# Patient Record
Sex: Male | Born: 1960 | Race: White | Hispanic: No | Marital: Married | Smoking: Current every day smoker
Health system: Southern US, Community
[De-identification: ages and names within clinical notes are randomized; demographics above are authoritative.]

## PROBLEM LIST (undated history)

## (undated) DIAGNOSIS — H9319 Tinnitus, unspecified ear: Secondary | ICD-10-CM

## (undated) DIAGNOSIS — F32A Depression, unspecified: Secondary | ICD-10-CM

## (undated) DIAGNOSIS — E785 Hyperlipidemia, unspecified: Secondary | ICD-10-CM

## (undated) DIAGNOSIS — G4730 Sleep apnea, unspecified: Secondary | ICD-10-CM

## (undated) DIAGNOSIS — J449 Chronic obstructive pulmonary disease, unspecified: Secondary | ICD-10-CM

## (undated) DIAGNOSIS — F419 Anxiety disorder, unspecified: Secondary | ICD-10-CM

## (undated) DIAGNOSIS — I639 Cerebral infarction, unspecified: Secondary | ICD-10-CM

## (undated) DIAGNOSIS — J439 Emphysema, unspecified: Secondary | ICD-10-CM

## (undated) DIAGNOSIS — K219 Gastro-esophageal reflux disease without esophagitis: Secondary | ICD-10-CM

## (undated) DIAGNOSIS — Z5189 Encounter for other specified aftercare: Secondary | ICD-10-CM

## (undated) DIAGNOSIS — J111 Influenza due to unidentified influenza virus with other respiratory manifestations: Secondary | ICD-10-CM

## (undated) DIAGNOSIS — R0902 Hypoxemia: Secondary | ICD-10-CM

## (undated) DIAGNOSIS — M1990 Unspecified osteoarthritis, unspecified site: Secondary | ICD-10-CM

## (undated) DIAGNOSIS — R911 Solitary pulmonary nodule: Secondary | ICD-10-CM

## (undated) DIAGNOSIS — T7840XA Allergy, unspecified, initial encounter: Secondary | ICD-10-CM

## (undated) DIAGNOSIS — I10 Essential (primary) hypertension: Secondary | ICD-10-CM

## (undated) DIAGNOSIS — C50919 Malignant neoplasm of unspecified site of unspecified female breast: Secondary | ICD-10-CM

## (undated) HISTORY — DX: Chronic obstructive pulmonary disease, unspecified: J44.9

## (undated) HISTORY — DX: Sleep apnea, unspecified: G47.30

## (undated) HISTORY — DX: Solitary pulmonary nodule: R91.1

## (undated) HISTORY — DX: Allergy, unspecified, initial encounter: T78.40XA

## (undated) HISTORY — DX: Depression, unspecified: F32.A

## (undated) HISTORY — PX: SPINE SURGERY: SHX786

## (undated) HISTORY — DX: Hypoxemia: R09.02

## (undated) HISTORY — DX: Gastro-esophageal reflux disease without esophagitis: K21.9

## (undated) HISTORY — PX: BACK SURGERY: SHX140

## (undated) HISTORY — DX: Emphysema, unspecified: J43.9

## (undated) HISTORY — DX: Essential (primary) hypertension: I10

## (undated) HISTORY — DX: Tinnitus, unspecified ear: H93.19

## (undated) HISTORY — DX: Malignant neoplasm of unspecified site of unspecified female breast: C50.919

## (undated) HISTORY — DX: Encounter for other specified aftercare: Z51.89

## (undated) HISTORY — DX: Hyperlipidemia, unspecified: E78.5

## (undated) HISTORY — DX: Cerebral infarction, unspecified: I63.9

---
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
## 2014-12-07 ENCOUNTER — Ambulatory Visit
Admission: RE | Admit: 2014-12-07 | Discharge: 2014-12-07 | Disposition: A | Discharge status: Home / Self Care | Payer: Self-pay | Source: Ambulatory Visit | Attending: Student | Admitting: Student

## 2014-12-07 ENCOUNTER — Other Ambulatory Visit: Payer: Self-pay | Admitting: Student

## 2014-12-07 DIAGNOSIS — M898X3 Other specified disorders of bone, forearm: Secondary | ICD-10-CM | POA: Insufficient documentation

## 2014-12-07 DIAGNOSIS — S52335A Nondisplaced oblique fracture of shaft of left radius, initial encounter for closed fracture: Secondary | ICD-10-CM

## 2014-12-07 DIAGNOSIS — M899 Disorder of bone, unspecified: Secondary | ICD-10-CM

## 2014-12-07 MED ORDER — GADOBENATE DIMEGLUMINE 529 MG/ML IV SOLN
20.0000 mL | Freq: Once | INTRAVENOUS | Status: AC | PRN
  Administered 2014-12-07: 18 mL via INTRAVENOUS

## 2015-11-15 ENCOUNTER — Encounter: Payer: Self-pay | Admitting: Urology

## 2015-11-15 ENCOUNTER — Ambulatory Visit (INDEPENDENT_AMBULATORY_CARE_PROVIDER_SITE_OTHER): Payer: Self-pay | Admitting: Urology

## 2015-11-15 VITALS — BP 149/83 | HR 96 | Ht 70.0 in | Wt 177.2 lb

## 2015-11-15 DIAGNOSIS — N486 Induration penis plastica: Secondary | ICD-10-CM

## 2015-11-15 DIAGNOSIS — Z125 Encounter for screening for malignant neoplasm of prostate: Secondary | ICD-10-CM

## 2015-11-15 NOTE — Progress Notes (Signed)
11/15/2015 10:25 AM   Henry Owens 1961-02-22 [id number]  Referring provider: Albina Billet, MD [address]   [place], [place] [address]  Chief Complaint  Patient presents with  . New Patient (Initial Visit)    penile pain    HPI:  1 - Peyronie's Disease - pt with new left sided penile curvature and "hinging" from base of penis that has been progressive over few most 2017. Admtis to minor sexual trauma preceeding. Denies underline erecitle dysfunction. He is minimally sexually active and this is overall not large bother. Presently non-painful.  2 - Prostate Screening - No FHX prostate cancer. Gets annual PSA by PCP 10/2015 - DRE 45 gm smooth / PSA (penidng and upcoming PCP visit per pt)  PMH sig for back surgeyr x several (some chronic buttock numbness and pain with dailiy low-dose narcotic). No CV disease. No blood thinner.s  Today "Henry Owens" is seen as new patient for above.    PMH: No past medical history on file.  Surgical History: No past surgical history on file.  Home Medications:    Medication List       Accurate as of 11/15/15 10:25 AM. Always use your most recent med list.          ALPRAZolam 1 MG tablet Commonly known as:  XANAX Take by mouth.   HYDROcodone-acetaminophen 7.5-325 MG tablet Commonly known as:  NORCO Take by mouth.   ibuprofen 200 MG tablet Commonly known as:  ADVIL,MOTRIN Take 200 mg by mouth every 6 (six) hours as needed.       Allergies:  Allergies  Allergen Reactions  . Azithromycin Rash  . Penicillin G Rash    Family History: Family History  Problem Relation Age of Onset  . Bladder Cancer Neg Hx   . Prostate cancer Neg Hx   . Hematuria Neg Hx     Social History:  reports that he has been smoking Cigarettes.  He has a 37.50 pack-year smoking history. He has never used smokeless tobacco. He reports that he uses drugs, including Amyl nitrate. He reports that he does not drink alcohol.  ROS: UROLOGY Frequent  Urination?: No Hard to postpone urination?: No Burning/pain with urination?: No Get up at night to urinate?: No Leakage of urine?: No Urine stream starts and stops?: No Trouble starting stream?: Yes Do you have to strain to urinate?: Yes Blood in urine?: No Urinary tract infection?: No Sexually transmitted disease?: No Injury to kidneys or bladder?: No Painful intercourse?: No Weak stream?: No Erection problems?: No Penile pain?: Yes  Gastrointestinal Nausea?: No Vomiting?: No Indigestion/heartburn?: No Diarrhea?: No Constipation?: Yes  Constitutional Fever: No Night sweats?: No Weight loss?: No Fatigue?: No  Skin Skin rash/lesions?: No Itching?: No  Eyes Blurred vision?: No Double vision?: No  Ears/Nose/Throat Sore throat?: No Sinus problems?: No  Hematologic/Lymphatic Swollen glands?: No Easy bruising?: No  Cardiovascular Leg swelling?: No Chest pain?: No  Respiratory Cough?: No Shortness of breath?: No  Endocrine Excessive thirst?: No  Musculoskeletal Back pain?: Yes Joint pain?: Yes  Neurological Headaches?: No Dizziness?: No  Psychologic Depression?: No Anxiety?: No  Physical Exam: BP (!) 149/83 (BP Location: Left Arm, Patient Position: Sitting)   Pulse 96   Ht 5\' 10"  (1.778 m)   Wt 177 lb 3.2 oz (80.4 kg)   BMI 25.43 kg/m   Constitutional:  Alert and oriented, No acute distress. HEENT:  AT, moist mucus membranes.  Trachea midline, no masses. Cardiovascular: No clubbing, cyanosis,  or edema. Respiratory: Normal respiratory effort, no increased work of breathing. GI: Abdomen is soft, nontender, nondistended, no abdominal masses GU: No CVA tenderness. Palpable pyeroneis plazue ventral proximal shaft that is non-painful.  Skin: No rashes, bruises or suspicious lesions. Lymph: No cervical or inguinal adenopathy. Neurologic: Grossly intact, no focal deficits, moving all 4 extremities. Psychiatric: Normal mood and  affect.  Laboratory Data: No results found for: WBC, HGB, HCT, MCV, PLT  No results found for: CREATININE  No results found for: PSA  No results found for: TESTOSTERONE  No results found for: HGBA1C  Urinalysis No results found for: COLORURINE, APPEARANCEUR, LABSPEC, PHURINE, GLUCOSEU, HGBUR, BILIRUBINUR, KETONESUR, PROTEINUR, UROBILINOGEN, NITRITE, LEUKOCYTESUR  Pertinent Imaging: none  Assessment & Plan:    1. Peyronie disease - natural history (progressive / active phase for few mos, followed by stable phase x years) and management options (observation, Ziaflex, Plications, Prosthesis) discussed in detail. At this point he wants observation. He is just reassured not dangerous.   He will request repeat eval should his goals of managent change.   2. Prostate cancer screening- up to date this year, rec continued annual screening until age 68 as average risk  3 - RTC Urol prn.    Return if symptoms worsen or fail to improve.  Alexis Frock, [facility] [address] [place], [place] [address] [phone]

## 2017-07-09 ENCOUNTER — Other Ambulatory Visit: Payer: Self-pay | Admitting: Orthopedic Surgery

## 2017-07-09 DIAGNOSIS — M25561 Pain in right knee: Secondary | ICD-10-CM

## 2017-07-13 ENCOUNTER — Ambulatory Visit
Admission: RE | Admit: 2017-07-13 | Discharge: 2017-07-13 | Disposition: A | Discharge status: Home / Self Care | Payer: Self-pay | Source: Ambulatory Visit | Attending: Orthopedic Surgery | Admitting: Orthopedic Surgery

## 2017-07-13 DIAGNOSIS — R600 Localized edema: Secondary | ICD-10-CM | POA: Insufficient documentation

## 2017-07-13 DIAGNOSIS — M7041 Prepatellar bursitis, right knee: Secondary | ICD-10-CM | POA: Insufficient documentation

## 2017-07-13 DIAGNOSIS — M948X6 Other specified disorders of cartilage, lower leg: Secondary | ICD-10-CM | POA: Insufficient documentation

## 2017-07-13 DIAGNOSIS — M25561 Pain in right knee: Secondary | ICD-10-CM | POA: Insufficient documentation

## 2017-07-23 DIAGNOSIS — J111 Influenza due to unidentified influenza virus with other respiratory manifestations: Secondary | ICD-10-CM

## 2017-07-23 HISTORY — DX: Influenza due to unidentified influenza virus with other respiratory manifestations: J11.1

## 2017-07-31 ENCOUNTER — Ambulatory Visit
Admission: RE | Admit: 2017-07-31 | Discharge: 2017-07-31 | Disposition: A | Discharge status: Home / Self Care | Payer: Self-pay | Source: Ambulatory Visit | Attending: Internal Medicine | Admitting: Internal Medicine

## 2017-07-31 DIAGNOSIS — Z01810 Encounter for preprocedural cardiovascular examination: Secondary | ICD-10-CM | POA: Insufficient documentation

## 2017-08-01 ENCOUNTER — Ambulatory Visit: Payer: Self-pay | Admitting: Orthopedic Surgery

## 2017-08-06 ENCOUNTER — Other Ambulatory Visit: Payer: Self-pay

## 2017-08-06 ENCOUNTER — Encounter
Admission: RE | Admit: 2017-08-06 | Discharge: 2017-08-06 | Disposition: A | Discharge status: Home / Self Care | Payer: Self-pay | Source: Ambulatory Visit | Attending: Orthopedic Surgery | Admitting: Orthopedic Surgery

## 2017-08-06 HISTORY — DX: Anxiety disorder, unspecified: F41.9

## 2017-08-06 HISTORY — DX: Unspecified osteoarthritis, unspecified site: M19.90

## 2017-08-06 NOTE — Patient Instructions (Signed)
Your procedure is scheduled on: 08-13-17 Report to Same Day Surgery 2nd floor medical mall [facility] Entrance-take elevator on left to 2nd floor.  Check in with surgery information desk.) To find out your arrival time please call [phone] between 1PM - 3PM on 08-10-17  Remember: Instructions that are not followed completely may result in serious medical risk, up to and including death, or upon the discretion of your surgeon and anesthesiologist your surgery may need to be rescheduled.    _x___ 1. Do not eat food after midnight the night before your procedure. NO GUM OR CANDY AFTER MIDNIGHT.  You may drink clear liquids up to 2 hours before you are scheduled to arrive at the hospital for your procedure.  Do not drink clear liquids within 2 hours of your scheduled arrival to the hospital.  Clear liquids include  --Water or Apple juice without pulp  --Clear carbohydrate beverage such as ClearFast or Gatorade  --Black Coffee or Clear Tea (No milk, no creamers, do not add anything to  the coffee or Tea     __x__ 2. No Alcohol for 24 hours before or after surgery.   __x__3. No Smoking or e-cigarettes for 24 prior to surgery.  Do not use any chewable tobacco products for at least 6 hour prior to surgery   ____  4. Bring all medications with you on the day of surgery if instructed.    __x__ 5. Notify your doctor if there is any change in your medical condition     (cold, fever, infections).    x___6. On the morning of surgery brush your teeth with toothpaste and water.  You may rinse your mouth with mouth wash if you wish.  Do not swallow any toothpaste or mouthwash.   Do not wear jewelry, make-up, hairpins, clips or nail polish.  Do not wear lotions, powders, or perfumes. You may wear deodorant.  Do not shave 48 hours prior to surgery. Men may shave face and neck.  Do not bring valuables to the hospital.    [facility] is not responsible for any belongings or valuables.     Contacts, dentures or bridgework may not be worn into surgery.  Leave your suitcase in the car. After surgery it may be brought to your room.  For patients admitted to the hospital, discharge time is determined by your  treatment team.  _  Patients discharged the day of surgery will not be allowed to drive home.  You will need someone to drive you home and stay with you the night of your procedure.     _x___ Take anti-hypertensive listed below, cardiac, seizure, asthma, anti-reflux and psychiatric medicines. These include:  1. YOU MAY TAKE YOUR XANAX/HYDROCODONE DAY OF SURGERY IF NEEDED WITH A SMALL SIP OF WATER  2.  3.  4.  5.  6.  ____Fleets enema or Magnesium Citrate as directed.   ____ Use CHG Soap or sage wipes as directed on instruction sheet   ____ Use inhalers on the day of surgery and bring to hospital day of surgery  ____ Stop Metformin and Janumet 2 days prior to surgery.    ____ Take 1/2 of usual insulin dose the night before surgery and none on the morning surgery.   ____ Follow recommendations from Cardiologist, Pulmonologist or PCP regarding stopping Aspirin, Coumadin, Plavix ,Eliquis, Effient, or Pradaxa, and Pletal.  X____Stop Anti-inflammatories such as Advil, Aleve, Ibuprofen, Motrin, Naproxen, Naprosyn, Goodies powders or aspirin products NOW-OK to take Tylenol  ____ Stop supplements until after surgery   ____ Bring C-Pap to the hospital.

## 2017-08-17 ENCOUNTER — Encounter: Payer: Self-pay | Admitting: *Deleted

## 2017-08-19 MED ORDER — CEFAZOLIN SODIUM-DEXTROSE 2-4 GM/100ML-% IV SOLN
2.0000 g | INTRAVENOUS | Status: AC
  Administered 2017-08-20: 2 g via INTRAVENOUS

## 2017-08-20 ENCOUNTER — Encounter
Admission: RE | Disposition: A | Discharge status: Home / Self Care | Payer: Self-pay | Source: Ambulatory Visit | Attending: Orthopedic Surgery

## 2017-08-20 ENCOUNTER — Ambulatory Visit: Payer: Self-pay | Admitting: Certified Registered"

## 2017-08-20 ENCOUNTER — Ambulatory Visit
Admission: RE | Admit: 2017-08-20 | Discharge: 2017-08-20 | Disposition: A | Discharge status: Home / Self Care | Payer: Self-pay | Source: Ambulatory Visit | Attending: Orthopedic Surgery | Admitting: Orthopedic Surgery

## 2017-08-20 ENCOUNTER — Other Ambulatory Visit: Payer: Self-pay

## 2017-08-20 DIAGNOSIS — Z881 Allergy status to other antibiotic agents status: Secondary | ICD-10-CM | POA: Insufficient documentation

## 2017-08-20 DIAGNOSIS — M1990 Unspecified osteoarthritis, unspecified site: Secondary | ICD-10-CM | POA: Insufficient documentation

## 2017-08-20 DIAGNOSIS — M23261 Derangement of other lateral meniscus due to old tear or injury, right knee: Secondary | ICD-10-CM | POA: Insufficient documentation

## 2017-08-20 DIAGNOSIS — M94261 Chondromalacia, right knee: Secondary | ICD-10-CM | POA: Insufficient documentation

## 2017-08-20 DIAGNOSIS — F17210 Nicotine dependence, cigarettes, uncomplicated: Secondary | ICD-10-CM | POA: Insufficient documentation

## 2017-08-20 DIAGNOSIS — F419 Anxiety disorder, unspecified: Secondary | ICD-10-CM | POA: Insufficient documentation

## 2017-08-20 DIAGNOSIS — Z880 Allergy status to penicillin: Secondary | ICD-10-CM | POA: Insufficient documentation

## 2017-08-20 DIAGNOSIS — Z79899 Other long term (current) drug therapy: Secondary | ICD-10-CM | POA: Insufficient documentation

## 2017-08-20 DIAGNOSIS — M2341 Loose body in knee, right knee: Secondary | ICD-10-CM | POA: Insufficient documentation

## 2017-08-20 DIAGNOSIS — M65861 Other synovitis and tenosynovitis, right lower leg: Secondary | ICD-10-CM | POA: Insufficient documentation

## 2017-08-20 HISTORY — DX: Influenza due to unidentified influenza virus with other respiratory manifestations: J11.1

## 2017-08-20 HISTORY — PX: KNEE ARTHROSCOPY WITH MEDIAL MENISECTOMY: SHX5651

## 2017-08-20 LAB — URINE DRUG SCREEN, QUALITATIVE (ARMC ONLY)
Amphetamines, Ur Screen: NOT DETECTED
BARBITURATES, UR SCREEN: NOT DETECTED
BENZODIAZEPINE, UR SCRN: POSITIVE — AB
CANNABINOID 50 NG, UR ~~LOC~~: POSITIVE — AB
Cocaine Metabolite,Ur ~~LOC~~: NOT DETECTED
MDMA (Ecstasy)Ur Screen: NOT DETECTED
Methadone Scn, Ur: NOT DETECTED
OPIATE, UR SCREEN: POSITIVE — AB
PHENCYCLIDINE (PCP) UR S: NOT DETECTED
Tricyclic, Ur Screen: NOT DETECTED

## 2017-08-20 SURGERY — ARTHROSCOPY, KNEE, WITH MEDIAL MENISCECTOMY
Anesthesia: General | Site: Knee | Laterality: Right | Wound class: Clean

## 2017-08-20 MED ORDER — FENTANYL CITRATE (PF) 100 MCG/2ML IJ SOLN
25.0000 ug | INTRAMUSCULAR | Status: AC | PRN
  Administered 2017-08-20 (×6): 25 ug via INTRAVENOUS

## 2017-08-20 MED ORDER — FENTANYL CITRATE (PF) 100 MCG/2ML IJ SOLN
INTRAMUSCULAR | Status: AC
  Administered 2017-08-20: 25 ug via INTRAVENOUS
  Filled 2017-08-20: qty 2

## 2017-08-20 MED ORDER — MORPHINE SULFATE (PF) 4 MG/ML IV SOLN
INTRAVENOUS | Status: AC
  Filled 2017-08-20: qty 1

## 2017-08-20 MED ORDER — EPINEPHRINE 30 MG/30ML IJ SOLN
INTRAMUSCULAR | Status: AC
  Filled 2017-08-20: qty 1

## 2017-08-20 MED ORDER — FAMOTIDINE 20 MG PO TABS
ORAL_TABLET | ORAL | Status: AC
  Filled 2017-08-20: qty 1

## 2017-08-20 MED ORDER — ROPIVACAINE HCL 5 MG/ML IJ SOLN
INTRAMUSCULAR | Status: AC
  Filled 2017-08-20: qty 20

## 2017-08-20 MED ORDER — LIDOCAINE HCL (CARDIAC) PF 100 MG/5ML IV SOSY
PREFILLED_SYRINGE | INTRAVENOUS | Status: DC | PRN
  Administered 2017-08-20: 100 mg via INTRAVENOUS

## 2017-08-20 MED ORDER — ONDANSETRON HCL 4 MG/2ML IJ SOLN: 4.0000 mg | Freq: Four times a day (QID) | INTRAMUSCULAR | Status: DC | PRN

## 2017-08-20 MED ORDER — FENTANYL CITRATE (PF) 100 MCG/2ML IJ SOLN
INTRAMUSCULAR | Status: AC
  Filled 2017-08-20: qty 2

## 2017-08-20 MED ORDER — DOCUSATE SODIUM 100 MG PO CAPS: 100.0000 mg | ORAL_CAPSULE | Freq: Every day | ORAL | 2 refills | Status: AC | PRN

## 2017-08-20 MED ORDER — OXYCODONE-ACETAMINOPHEN 5-325 MG PO TABS
1.0000 | ORAL_TABLET | ORAL | Status: DC | PRN
  Administered 2017-08-20: 1 via ORAL

## 2017-08-20 MED ORDER — ONDANSETRON HCL 4 MG/2ML IJ SOLN
INTRAMUSCULAR | Status: DC | PRN
  Administered 2017-08-20: 4 mg via INTRAVENOUS

## 2017-08-20 MED ORDER — LABETALOL HCL 5 MG/ML IV SOLN
INTRAVENOUS | Status: AC
  Administered 2017-08-20: 10 mg via INTRAVENOUS
  Filled 2017-08-20: qty 4

## 2017-08-20 MED ORDER — OXYCODONE-ACETAMINOPHEN 5-325 MG PO TABS
ORAL_TABLET | ORAL | Status: AC
  Filled 2017-08-20: qty 1

## 2017-08-20 MED ORDER — LACTATED RINGERS IV SOLN
INTRAVENOUS | Status: DC | PRN
  Administered 2017-08-20: 4 mL

## 2017-08-20 MED ORDER — BUPIVACAINE-EPINEPHRINE (PF) 0.25% -1:200000 IJ SOLN
INTRAMUSCULAR | Status: AC
  Filled 2017-08-20: qty 30

## 2017-08-20 MED ORDER — GABAPENTIN 300 MG PO CAPS
300.0000 mg | ORAL_CAPSULE | Freq: Once | ORAL | Status: AC
  Administered 2017-08-20: 300 mg via ORAL

## 2017-08-20 MED ORDER — METHYLPREDNISOLONE ACETATE 40 MG/ML IJ SUSP
INTRAMUSCULAR | Status: AC
  Filled 2017-08-20: qty 1

## 2017-08-20 MED ORDER — MIDAZOLAM HCL 2 MG/2ML IJ SOLN
INTRAMUSCULAR | Status: DC | PRN
  Administered 2017-08-20: 2 mg via INTRAVENOUS

## 2017-08-20 MED ORDER — ONDANSETRON HCL 4 MG/2ML IJ SOLN: 4.0000 mg | Freq: Once | INTRAMUSCULAR | Status: DC | PRN

## 2017-08-20 MED ORDER — ROPIVACAINE HCL 0.5 % EP SOLN
EPIDURAL | Status: DC | PRN
  Administered 2017-08-20: 10 mL

## 2017-08-20 MED ORDER — CEFAZOLIN SODIUM-DEXTROSE 2-4 GM/100ML-% IV SOLN
INTRAVENOUS | Status: AC
  Filled 2017-08-20: qty 100

## 2017-08-20 MED ORDER — ACETAMINOPHEN 500 MG PO TABS
ORAL_TABLET | ORAL | Status: AC
  Filled 2017-08-20: qty 2

## 2017-08-20 MED ORDER — MORPHINE SULFATE 4 MG/ML IJ SOLN
INTRAMUSCULAR | Status: DC | PRN
  Administered 2017-08-20: 4 mg

## 2017-08-20 MED ORDER — ONDANSETRON HCL 4 MG/2ML IJ SOLN
INTRAMUSCULAR | Status: AC
  Filled 2017-08-20: qty 2

## 2017-08-20 MED ORDER — PROPOFOL 10 MG/ML IV BOLUS
INTRAVENOUS | Status: AC
  Filled 2017-08-20: qty 20

## 2017-08-20 MED ORDER — CHLORHEXIDINE GLUCONATE 4 % EX LIQD
60.0000 mL | Freq: Once | CUTANEOUS | Status: AC
Start: 2017-08-20 — End: 2017-08-20
  Administered 2017-08-20: 4 via TOPICAL

## 2017-08-20 MED ORDER — METOCLOPRAMIDE HCL 10 MG PO TABS: 5.0000 mg | ORAL_TABLET | Freq: Three times a day (TID) | ORAL | Status: DC | PRN

## 2017-08-20 MED ORDER — LABETALOL HCL 5 MG/ML IV SOLN
10.0000 mg | Freq: Once | INTRAVENOUS | Status: AC
  Administered 2017-08-20: 10 mg via INTRAVENOUS

## 2017-08-20 MED ORDER — LACTATED RINGERS IV SOLN: INTRAVENOUS | Status: DC

## 2017-08-20 MED ORDER — LACTATED RINGERS IV SOLN
INTRAVENOUS | Status: DC
  Administered 2017-08-20: 09:00:00 via INTRAVENOUS

## 2017-08-20 MED ORDER — MIDAZOLAM HCL 2 MG/2ML IJ SOLN
INTRAMUSCULAR | Status: AC
  Filled 2017-08-20: qty 2

## 2017-08-20 MED ORDER — LIDOCAINE HCL (PF) 2 % IJ SOLN
INTRAMUSCULAR | Status: AC
  Filled 2017-08-20: qty 10

## 2017-08-20 MED ORDER — HYDROCODONE-ACETAMINOPHEN 5-325 MG PO TABS: 1.0000 | ORAL_TABLET | ORAL | 0 refills | Status: AC | PRN

## 2017-08-20 MED ORDER — FAMOTIDINE 20 MG PO TABS
20.0000 mg | ORAL_TABLET | Freq: Once | ORAL | Status: AC
  Administered 2017-08-20: 20 mg via ORAL

## 2017-08-20 MED ORDER — FENTANYL CITRATE (PF) 100 MCG/2ML IJ SOLN
INTRAMUSCULAR | Status: DC | PRN
  Administered 2017-08-20 (×2): 50 ug via INTRAVENOUS
  Administered 2017-08-20: 25 ug via INTRAVENOUS
  Administered 2017-08-20: 50 ug via INTRAVENOUS
  Administered 2017-08-20: 25 ug via INTRAVENOUS

## 2017-08-20 MED ORDER — HYDROMORPHONE HCL 1 MG/ML IJ SOLN: 0.5000 mg | INTRAMUSCULAR | Status: DC | PRN

## 2017-08-20 MED ORDER — ACETAMINOPHEN 500 MG PO TABS
1000.0000 mg | ORAL_TABLET | Freq: Once | ORAL | Status: AC
  Administered 2017-08-20: 1000 mg via ORAL

## 2017-08-20 MED ORDER — GABAPENTIN 300 MG PO CAPS
ORAL_CAPSULE | ORAL | Status: AC
  Filled 2017-08-20: qty 1

## 2017-08-20 MED ORDER — BUPIVACAINE-EPINEPHRINE (PF) 0.25% -1:200000 IJ SOLN
INTRAMUSCULAR | Status: DC | PRN
  Administered 2017-08-20: 10 mL

## 2017-08-20 MED ORDER — PROPOFOL 10 MG/ML IV BOLUS
INTRAVENOUS | Status: DC | PRN
  Administered 2017-08-20: 160 mg via INTRAVENOUS

## 2017-08-20 MED ORDER — ONDANSETRON HCL 4 MG PO TABS: 4.0000 mg | ORAL_TABLET | Freq: Four times a day (QID) | ORAL | Status: DC | PRN

## 2017-08-20 MED ORDER — METOCLOPRAMIDE HCL 5 MG/ML IJ SOLN: 5.0000 mg | Freq: Three times a day (TID) | INTRAMUSCULAR | Status: DC | PRN

## 2017-08-20 MED ORDER — METHYLPREDNISOLONE ACETATE 40 MG/ML IJ SUSP
INTRAMUSCULAR | Status: DC | PRN
  Administered 2017-08-20: 40 mg

## 2017-08-20 MED FILL — Ropivacaine HCl Inj 5 MG/ML: INTRAMUSCULAR | Qty: 30 | Status: AC

## 2017-08-20 SURGICAL SUPPLY — 35 items
ADAPTER IRRIG TUBE 2 SPIKE SOL (ADAPTER) ×6 IMPLANT
BLADE FULL RADIUS 3.5 (BLADE) IMPLANT
BLADE INCISOR PLUS 4.5 (BLADE) ×3 IMPLANT
BLADE SHAVER 4.5 DBL SERAT CV (CUTTER) ×6 IMPLANT
BLADE SURG SZ11 CARB STEEL (BLADE) ×3 IMPLANT
BNDG COHESIVE 4X5 TAN STRL (GAUZE/BANDAGES/DRESSINGS) ×3 IMPLANT
BRUSH SCRUB EZ  4% CHG (MISCELLANEOUS) ×4
BRUSH SCRUB EZ 4% CHG (MISCELLANEOUS) ×2 IMPLANT
CHLORAPREP W/TINT 26ML (MISCELLANEOUS) ×3 IMPLANT
COOLER POLAR GLACIER W/PUMP (MISCELLANEOUS) ×3 IMPLANT
DRAPE IMP U-DRAPE 54X76 (DRAPES) ×3 IMPLANT
GAUZE PETRO XEROFOAM 1X8 (MISCELLANEOUS) ×3 IMPLANT
GAUZE SPONGE 4X4 12PLY STRL (GAUZE/BANDAGES/DRESSINGS) ×3 IMPLANT
GLOVE INDICATOR 8.0 STRL GRN (GLOVE) ×3 IMPLANT
GLOVE SURG ORTHO 8.0 STRL STRW (GLOVE) ×3 IMPLANT
GOWN STRL REUS W/ TWL LRG LVL3 (GOWN DISPOSABLE) ×1 IMPLANT
GOWN STRL REUS W/ TWL XL LVL3 (GOWN DISPOSABLE) ×1 IMPLANT
GOWN STRL REUS W/TWL LRG LVL3 (GOWN DISPOSABLE) ×2
GOWN STRL REUS W/TWL XL LVL3 (GOWN DISPOSABLE) ×2
IV LACTATED RINGER IRRG 3000ML (IV SOLUTION) ×8
IV LR IRRIG 3000ML ARTHROMATIC (IV SOLUTION) ×4 IMPLANT
KIT TURNOVER KIT A (KITS) ×3 IMPLANT
MANIFOLD NEPTUNE II (INSTRUMENTS) ×3 IMPLANT
MAT BLUE FLOOR 46X72 FLO (MISCELLANEOUS) ×3 IMPLANT
NEEDLE SPNL 20GX3.5 QUINCKE YW (NEEDLE) ×3 IMPLANT
PACK ARTHROSCOPY KNEE (MISCELLANEOUS) ×3 IMPLANT
PAD ABD DERMACEA PRESS 5X9 (GAUZE/BANDAGES/DRESSINGS) ×6 IMPLANT
PAD WRAPON POLAR KNEE (MISCELLANEOUS) ×1 IMPLANT
SUT ETHILON 4-0 (SUTURE) ×2
SUT ETHILON 4-0 FS2 18XMFL BLK (SUTURE) ×1
SUTURE ETHLN 4-0 FS2 18XMF BLK (SUTURE) ×1 IMPLANT
SYR 3ML 18GX1 1/2 (SYRINGE) ×3 IMPLANT
TUBING ARTHRO INFLOW-ONLY STRL (TUBING) ×3 IMPLANT
WAND HAND CNTRL MULTIVAC 90 (MISCELLANEOUS) ×3 IMPLANT
WRAPON POLAR PAD KNEE (MISCELLANEOUS) ×3

## 2017-08-20 NOTE — Anesthesia Post-op Follow-up Note (Signed)
Anesthesia QCDR form completed.        

## 2017-08-20 NOTE — Transfer of Care (Signed)
Immediate Anesthesia Transfer of Care Note  Patient: Henry Owens  Procedure(s) Performed: KNEE ARTHROSCOPY WITH PARTIAL MEDIAL AND LATERAL MENISECTOMY,PARTIAL SYNOVECTOMY,CHONDROPLASTY, LOOSE BODY REMOVAL (Right Knee)  Patient Location: PACU  Anesthesia Type:General  Level of Consciousness: sedated and responds to stimulation  Airway & Oxygen Therapy: Patient Spontanous Breathing and Patient connected to face mask oxygen  Post-op Assessment: Report given to RN and Post -op Vital signs reviewed and stable  Post vital signs: Reviewed and stable  Last Vitals:  Vitals Value Taken Time  BP 168/101 08/20/2017 11:57 AM  Temp    Pulse 69 08/20/2017 11:57 AM  Resp 10 08/20/2017 11:57 AM  SpO2 100 % 08/20/2017 11:57 AM  Vitals shown include unvalidated device data.  Last Pain:  Vitals:   08/20/17 0928  TempSrc: Temporal  PainSc: 4          Complications: No apparent anesthesia complications

## 2017-08-20 NOTE — Anesthesia Postprocedure Evaluation (Signed)
Anesthesia Post Note  Patient: Henry Owens  Procedure(s) Performed: KNEE ARTHROSCOPY WITH PARTIAL MEDIAL AND LATERAL MENISECTOMY,PARTIAL SYNOVECTOMY,CHONDROPLASTY, LOOSE BODY REMOVAL (Right Knee)  Patient location during evaluation: PACU Anesthesia Type: General Level of consciousness: awake and alert and oriented Pain management: pain level controlled Vital Signs Assessment: post-procedure vital signs reviewed and stable Respiratory status: spontaneous breathing Cardiovascular status: blood pressure returned to baseline Anesthetic complications: no     Last Vitals:  Vitals:   08/20/17 1315 08/20/17 1349  BP: 140/78 (!) 153/87  Pulse: (!) 56   Resp: 16 16  Temp: (!) 36.4 C 36.7 C  SpO2: 96% 97%    Last Pain:  Vitals:   08/20/17 1349  TempSrc: Temporal  PainSc: 6                  Terrace Chiem

## 2017-08-20 NOTE — Op Note (Signed)
  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 male with a diagnosis of [place] who failed conservative management and elected for surgical management.    The risks benefits and alternatives were discussed with the patient preoperatively including the risks of infection, bleeding, nerve injury, knee stiffness, persistent pain, osteoarthritis and the need for further surgery. Medical  risks include DVT and pulmonary embolism, myocardial infarction, stroke, pneumonia, respiratory failure and death. The patient understood these risks and wished to proceed.   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 ACL and PCL were intact. There was no lateral subluxation of the patella. Loose bodies were identified within the lateral gutter, the medial side had no loose bodies.  OPERATIVE PROCEDURE: Patient was met in the preoperative area. The operative extremity was signed with my initials according the hospital's correct site of surgery protocol.  The patient was brought to the operating room where they was placed supine on the operative table. General anesthesia was administered. The patient was prepped and draped in a sterile fashion.  A timeout was performed to verify the patient's name, date of birth, medical record number, correct site of surgery correct  procedure to be performed. It was also used to verify the patient received antibiotics that all appropriate instruments, and radiographic studies were available in the room. Once all in attendance were in agreement, the case began.  Proposed arthroscopy incisions were drawn out with a surgical marker. These were pre-injected with 0.5% marcaine with epinephrine. An 11 blade was used to establish an inferior lateral and inferomedial portals. The inferomedial portal was created using a 18-gauge spinal needle under direct visualization.  A full diagnostic examination of the knee was performed including the suprapatellar pouch, patellofemoral joint, medial lateral compartments as well as the medial lateral gutters, the intercondylar notch in the posterior knee.  Patient had the both the medial and lateral meniscal tear treated with a 4-0 resector shaver blade and straight duckbill basket. The meniscus was debrided until a stable rim was achieved. A chondroplasty of the medial femoral condyle, trochlea and undersurface of patella was also performed using a 4-0 resector shaver blade. A partial synovectomy was also performed using a 4-0 resector shaver blade and electrocautery.  The knee was then copiously lavaged. All arthroscopic instruments were removed. The 2 arthroscopy portals were closed with 4-0 nylon. A dry sterile and compressive dressing was applied. The patient was brought to the PACU in stable condition. I was scrubbed and present for the entire case and all sharp and instrument counts were correct at the conclusion the case. I spoke with the patient's family postoperatively to let them know the case was performed without complication and the patient was stable in the recovery room.  Kurtis Bushman, MD

## 2017-08-20 NOTE — H&P (Addendum)
PREOPERATIVE H&P  Chief Complaint: X54.008Q Prph tear of medial meniscus, current injury, r knee, subs  HPI: Henry Owens is a 57 y.o. male who presents for preoperative history and physical with a diagnosis of S83.221D Prph tear of medial meniscus, current injury, r knee, subs. Symptoms are rated as moderate to severe, and have been worsening.  This is significantly impairing activities of daily living.  He has elected for surgical management.   Past Medical History:  Diagnosis Date  . Anxiety   . Arthritis   . Flu 07/2017   Past Surgical History:  Procedure Laterality Date  . APPENDECTOMY  1975  . BACK SURGERY    . [place]   Social History   Socioeconomic History  . Marital status: Married    Spouse name: Not on file  . Number of children: Not on file  . Years of education: Not on file  . Highest education level: Not on file  Occupational History  . Not on file  Social Needs  . Financial resource strain: Not on file  . Food insecurity:    Worry: Not on file    Inability: Not on file  . Transportation needs:    Medical: Not on file    Non-medical: Not on file  Tobacco Use  . Smoking status: Current Every Day Smoker    Packs/day: 1.50    Years: 25.00    Pack years: 37.50    Types: Cigarettes  . Smokeless tobacco: Never Used  Substance and Sexual Activity  . Alcohol use: No  . Drug use: Yes    Types: Amyl nitrate    Comment: PT STATES HE DID THIS 40 YEARS AGO  . Sexual activity: Not on file  Lifestyle  . Physical activity:    Days per week: Not on file    Minutes per session: Not on file  . Stress: Not on file  Relationships  . Social connections:    Talks on phone: Not on file    Gets together: Not on file    Attends religious service: Not on file    Active member of club or organization: Not on file    Attends meetings of clubs or organizations: Not on file    Relationship status: Not on file  Other Topics Concern  .  Not on file  Social History Narrative  . Not on file   Family History  Problem Relation Age of Onset  . Bladder Cancer Neg Hx   . Prostate cancer Neg Hx   . Hematuria Neg Hx    Allergies  Allergen Reactions  . Azithromycin Rash  . Penicillin G Rash    Has patient had a PCN reaction causing immediate rash, facial/tongue/throat swelling, SOB or lightheadedness with hypotension: Yes Has patient had a PCN reaction causing severe rash involving mucus membranes or skin necrosis: No Has patient had a PCN reaction that required hospitalization: No Has patient had a PCN reaction occurring within the last 10 years: No If all of the above answers are "NO", then may proceed with Cephalosporin use.    Prior to Admission medications   Medication Sig Start Date End Date Taking? Authorizing Provider  ALPRAZolam Duanne Moron) 1 MG tablet Take 1 mg by mouth 3 (three) times daily as needed for anxiety.    Yes [provider]  fluticasone (FLONASE) 50 MCG/ACT nasal spray Place 1 spray into both nostrils daily as needed for allergies or rhinitis.  Yes [provider]  HYDROcodone-acetaminophen (NORCO) 7.5-325 MG tablet Take 1 tablet by mouth 3 (three) times daily as needed for severe pain.    Yes [provider]  Tetrahydrozoline HCl (VISINE OP) Place 1 drop into both eyes daily as needed (allergies).   Yes [provider]     Positive ROS: All other systems have been reviewed and were otherwise negative with the exception of those mentioned in the HPI and as above.  Physical Exam: General: Alert, no acute distress Cardiovascular: Regular rate and rhythm, no murmurs rubs or gallops.  No pedal edema Respiratory: Clear to auscultation bilaterally, no wheezes rales or rhonchi. No cyanosis, no use of accessory musculature GI: No organomegaly, abdomen is soft and non-tender nondistended with positive bowel sounds. Skin: Skin intact, no lesions within the operative  field. Neurologic: Sensation intact distally Psychiatric: Patient is competent for consent with normal mood and affect Lymphatic: No axillary or cervical lymphadenopathy  MUSCULOSKELETAL: right knee with medial joint line tenderness, positive [place]  Assessment: S83.221D Prph tear of medial meniscus, current injury, r knee, subs  Plan: Plan for Procedure(s): KNEE ARTHROSCOPY WITH MEDIAL MENISECTOMY, RIGHT Possible partial synovectomy, chondroplasty and removal of loose body  I discussed the risks and benefits of surgery. The risks include but are not limited to infection, bleeding requiring blood transfusion, nerve or blood vessel injury, joint stiffness or loss of motion, persistent pain, weakness or instability, malunion, nonunion and hardware failure and the need for further surgery. Medical risks include but are not limited to DVT and pulmonary embolism, myocardial infarction, stroke, pneumonia, respiratory failure and death. Patient understood these risks and wished to proceed.   Lovell Sheehan, MD   08/20/2017 10:22 AM

## 2017-08-20 NOTE — Anesthesia Preprocedure Evaluation (Signed)
Anesthesia Evaluation  Patient identified by MRN, date of birth, ID band Patient awake    Reviewed: Allergy & Precautions, NPO status , Patient's Chart, lab work & pertinent test results  Airway Mallampati: III  TM Distance: <3 FB     Dental   Pulmonary Current Smoker,    Pulmonary exam normal        Cardiovascular negative cardio ROS Normal cardiovascular exam     Neuro/Psych Anxiety    GI/Hepatic negative GI ROS, Neg liver ROS,   Endo/Other  negative endocrine ROS  Renal/GU negative Renal ROS  negative genitourinary   Musculoskeletal  (+) Arthritis , Osteoarthritis,    Abdominal Normal abdominal exam  (+)   Peds negative pediatric ROS (+)  Hematology negative hematology ROS (+)   Anesthesia Other Findings   Reproductive/Obstetrics                             Anesthesia Physical Anesthesia Plan  ASA: II  Anesthesia Plan: General   Post-op Pain Management:    Induction: Intravenous  PONV Risk Score and Plan:   Airway Management Planned: LMA  Additional Equipment:   Intra-op Plan:   Post-operative Plan: Extubation in OR  Informed Consent: I have reviewed the patients History and Physical, chart, labs and discussed the procedure including the risks, benefits and alternatives for the proposed anesthesia with the patient or authorized representative who has indicated his/her understanding and acceptance.   Dental advisory given  Plan Discussed with: CRNA and Surgeon  Anesthesia Plan Comments:         Anesthesia Quick Evaluation

## 2017-08-20 NOTE — Discharge Instructions (Addendum)
Post Op Home Instructions for Knee Arthroscopy ° °1) Do not sit for longer than 1 hour at a time with your leg dangling down.  You should have your legs elevated (higher than your heart) in a recliner chair or couch. ° °2) You may be up walking around as tolerated but should take periodic breaks to elevate your legs.  Discontinue use of crutches when you feel you are able to walk without pain or a limp. ° °3) Work on gentle bending and straightening of the knee. ° °4) You may remove the Ace wrap and dressings two days after surgery.  Place band aids over the incision sites. ° °5) You may shower after you remove the surgical dressing.  You do not need to cover the incision with plastic wrap.  The incision can get wet, but do not submerge under water.  After your sutures have been removed, you should wait 24 hours before submerging incision under water. ° °6) Pain medication can cause constipation.  You should increase your fluid intake, increase your intake of high fiber foods and/or take Metamucil as needed for constipation. ° °7) Continue your physical therapy exercises, as shown at the office, at least twice daily.  You should set up outpatient physical therapy and start within the first week after surgery. ° °8) Continue to use your Polar Pack continuously for 2-3 days after surgery.  After you remove the surgical dressing, it is a good idea to use your Polar Pack or ice pack for 30 minutes after doing your exercises to reduce swelling. ° °9) Do not be surprised if you have increased pain at night.  This usually means you have been a little too active during the day and need to reduce your activities. ° °10) If you develop lower extremity swelling that does not improve after a night of elevation, please call the office.  This could be an early sign of a blood clot. ° °Please call with any questions at [phone] ° ° °AMBULATORY SURGERY  °DISCHARGE INSTRUCTIONS ° ° °1) The drugs that you were given will stay in  your system until tomorrow so for the next 24 hours you should not: ° °A) Drive an automobile °B) Make any legal decisions °C) Drink any alcoholic beverage ° ° °2) You may resume regular meals tomorrow.  Today it is better to start with liquids and gradually work up to solid foods. ° °You may eat anything you prefer, but it is better to start with liquids, then soup and crackers, and gradually work up to solid foods. ° ° °3) Please notify your doctor immediately if you have any unusual bleeding, trouble breathing, redness and pain at the surgery site, drainage, fever, or pain not relieved by medication. ° ° ° °4) Additional Instructions: ° ° ° ° ° ° ° °Please contact your physician with any problems or Same Day Surgery at [phone], Monday through Friday 6 am to 4 pm, or [facility] [place] Main number at [phone]. °

## 2017-08-20 NOTE — Anesthesia Procedure Notes (Signed)
Procedure Name: LMA Insertion Performed by: Berdina Cheever, CRNA Pre-anesthesia Checklist: Patient identified, Patient being monitored, Timeout performed, Emergency Drugs available and Suction available Patient Re-evaluated:Patient Re-evaluated prior to induction Oxygen Delivery Method: Circle system utilized Preoxygenation: Pre-oxygenation with 100% oxygen Induction Type: IV induction Ventilation: Mask ventilation without difficulty LMA: LMA inserted LMA Size: 4.5 Tube type: Oral Number of attempts: 1 Placement Confirmation: positive ETCO2 and breath sounds checked- equal and bilateral Tube secured with: Tape Dental Injury: Teeth and Oropharynx as per pre-operative assessment        

## 2017-08-21 ENCOUNTER — Encounter: Payer: Self-pay | Admitting: Orthopedic Surgery

## 2020-05-01 DIAGNOSIS — R898 Other abnormal findings in specimens from other organs, systems and tissues: Secondary | ICD-10-CM | POA: Insufficient documentation

## 2020-05-01 NOTE — Progress Notes (Deleted)
[place]  [phone] Fax:(336) [phone]  ID: Henry Owens OB: 10/07/1960  MR#: [id number]  [id number]#:[id number]  Patient Care Team: Albina Billet, MD as PCP - General (Internal Medicine)  CHIEF COMPLAINT: Abnormal bone marrow on MRI.  INTERVAL HISTORY: ***  REVIEW OF SYSTEMS:   ROS  As per HPI. Otherwise, a complete review of systems is negative.  PAST MEDICAL HISTORY: Past Medical History:  Diagnosis Date  . Anxiety   . Arthritis   . Flu 07/2017    PAST SURGICAL HISTORY: Past Surgical History:  Procedure Laterality Date  . APPENDECTOMY  1975  . BACK SURGERY    . KNEE ARTHROSCOPY WITH MEDIAL MENISECTOMY Right 08/20/2017   Procedure: KNEE ARTHROSCOPY WITH PARTIAL MEDIAL AND LATERAL MENISECTOMY,PARTIAL SYNOVECTOMY,CHONDROPLASTY, LOOSE BODY REMOVAL;  Surgeon: Lovell Sheehan, MD;  Location: [facility] ORS;  Service: Orthopedics;  Laterality: Right;  . [place], 2000    FAMILY HISTORY: Family History  Problem Relation Age of Onset  . Bladder Cancer Neg Hx   . Prostate cancer Neg Hx   . Hematuria Neg Hx     ADVANCED DIRECTIVES (Y/N):  N  HEALTH MAINTENANCE: Social History   Tobacco Use  . Smoking status: Current Every Day Smoker    Packs/day: 1.50    Years: 25.00    Pack years: 37.50    Types: Cigarettes  . Smokeless tobacco: Never Used  Vaping Use  . Vaping Use: Never used  Substance Use Topics  . Alcohol use: No  . 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 Azithromycin Rash  . Penicillin G Rash    Has patient had a PCN reaction causing immediate rash, facial/tongue/throat swelling, SOB or lightheadedness with hypotension: Yes Has patient had a PCN reaction causing severe rash involving mucus membranes or skin necrosis: No Has patient had a PCN reaction that required  hospitalization: No Has patient had a PCN reaction occurring within the last 10 years: No If all of the above answers are "NO", then may proceed with Cephalosporin use.     Current Outpatient Medications  Medication Sig Dispense Refill  . ALPRAZolam (XANAX) 1 MG tablet Take 1 mg by mouth 3 (three) times daily as needed for anxiety.     . fluticasone (FLONASE) 50 MCG/ACT nasal spray Place 1 spray into both nostrils daily as needed for allergies or rhinitis.    . Tetrahydrozoline HCl (VISINE OP) Place 1 drop into both eyes daily as needed (allergies).     No current facility-administered medications for this visit.    OBJECTIVE: There were no vitals filed for this visit.   There is no height or weight on file to calculate BMI.    ECOG FS:{CHL ONC [id number]  General: Well-developed, well-nourished, no acute distress. Eyes: Pink conjunctiva, anicteric sclera. HEENT: Normocephalic, moist mucous membranes. Lungs: No audible wheezing or coughing. Heart: Regular rate and rhythm. Abdomen: Soft, nontender, no obvious distention. Musculoskeletal: No edema, cyanosis, or clubbing. Neuro: Alert, answering all questions appropriately. Cranial nerves grossly intact. Skin: No rashes or petechiae noted. Psych: Normal affect. Lymphatics: No cervical, calvicular, axillary or inguinal LAD.   LAB RESULTS:  No results found for: NA, K, CL, CO2, GLUCOSE, BUN, CREATININE, CALCIUM, PROT, ALBUMIN, AST, ALT, ALKPHOS, BILITOT, GFRNONAA, GFRAA  No  results found for: WBC, NEUTROABS, HGB, HCT, MCV, PLT   STUDIES: No results found.  ASSESSMENT: Abnormal bone marrow on MRI.  PLAN:    1. Abnormal bone marrow on MRI:  Patient expressed understanding and was in agreement with this plan. He also understands that He can call clinic at any time with any questions, concerns, or complaints.   Cancer Staging No matching staging information was found for the patient.  Lloyd Huger, MD   05/01/2020  11:00 AM

## 2020-05-06 ENCOUNTER — Ambulatory Visit: Payer: Self-pay | Admitting: Oncology

## 2020-05-06 ENCOUNTER — Other Ambulatory Visit: Payer: Self-pay

## 2020-05-06 DIAGNOSIS — R898 Other abnormal findings in specimens from other organs, systems and tissues: Secondary | ICD-10-CM

## 2020-05-08 NOTE — Progress Notes (Deleted)
[place]  Telephone:(336) [phone] Fax:(336) [phone]  ID: Henry Owens OB: 1960/06/20  MR#: [id number]  [id number]#:[id number]  Patient Care Team: Albina Billet, MD as PCP - General (Internal Medicine)  CHIEF COMPLAINT: Abnormal bone marrow signal on imaging.  INTERVAL HISTORY: ***  REVIEW OF SYSTEMS:   ROS  As per HPI. Otherwise, a complete review of systems is negative.  PAST MEDICAL HISTORY: Past Medical History:  Diagnosis Date  . Anxiety   . Arthritis   . Flu 07/2017    PAST SURGICAL HISTORY: Past Surgical History:  Procedure Laterality Date  . APPENDECTOMY  1975  . BACK SURGERY    . KNEE ARTHROSCOPY WITH MEDIAL MENISECTOMY Right 08/20/2017   Procedure: KNEE ARTHROSCOPY WITH PARTIAL MEDIAL AND LATERAL MENISECTOMY,PARTIAL SYNOVECTOMY,CHONDROPLASTY, LOOSE BODY REMOVAL;  Surgeon: Lovell Sheehan, MD;  Location: [facility] ORS;  Service: Orthopedics;  Laterality: Right;  . [place], 2000    FAMILY HISTORY: Family History  Problem Relation Age of Onset  . Bladder Cancer Neg Hx   . Prostate cancer Neg Hx   . Hematuria Neg Hx     ADVANCED DIRECTIVES (Y/N):  N  HEALTH MAINTENANCE: Social History   Tobacco Use  . Smoking status: Current Every Day Smoker    Packs/day: 1.50    Years: 25.00    Pack years: 37.50    Types: Cigarettes  . Smokeless tobacco: Never Used  Vaping Use  . Vaping Use: Never used  Substance Use Topics  . Alcohol use: No  . 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 Azithromycin Rash  . Penicillin G Rash    Has patient had a PCN reaction causing immediate rash, facial/tongue/throat swelling, SOB or lightheadedness with hypotension: Yes Has patient had a PCN reaction causing severe rash involving mucus membranes or skin necrosis: No Has patient had a PCN reaction that  required hospitalization: No Has patient had a PCN reaction occurring within the last 10 years: No If all of the above answers are "NO", then may proceed with Cephalosporin use.     Current Outpatient Medications  Medication Sig Dispense Refill  . ALPRAZolam (XANAX) 1 MG tablet Take 1 mg by mouth 3 (three) times daily as needed for anxiety.     . fluticasone (FLONASE) 50 MCG/ACT nasal spray Place 1 spray into both nostrils daily as needed for allergies or rhinitis.    . Tetrahydrozoline HCl (VISINE OP) Place 1 drop into both eyes daily as needed (allergies).     No current facility-administered medications for this visit.    OBJECTIVE: There were no vitals filed for this visit.   There is no height or weight on file to calculate BMI.    ECOG FS:{CHL ONC [id number]  General: Well-developed, well-nourished, no acute distress. Eyes: Pink conjunctiva, anicteric sclera. HEENT: Normocephalic, moist mucous membranes. Lungs: No audible wheezing or coughing. Heart: Regular rate and rhythm. Abdomen: Soft, nontender, no obvious distention. Musculoskeletal: No edema, cyanosis, or clubbing. Neuro: Alert, answering all questions appropriately. Cranial nerves grossly intact. Skin: No rashes or petechiae noted. Psych: Normal affect. Lymphatics: No cervical, calvicular, axillary or inguinal LAD.   LAB RESULTS:  No results found for: NA, K, CL, CO2, GLUCOSE, BUN, CREATININE, CALCIUM, PROT, ALBUMIN, AST, ALT, ALKPHOS, BILITOT, GFRNONAA, GFRAA  No results found for: WBC, NEUTROABS, HGB, HCT, MCV, PLT   STUDIES: No results found.  ASSESSMENT: Abnormal bone marrow signal on imaging.  PLAN:    1. Abnormal bone marrow signal on imaging:  Patient expressed understanding and was in agreement with this plan. He also understands that He can call clinic at any time with any questions, concerns, or complaints.   Cancer Staging No matching staging information was found for the  patient.  Lloyd Huger, MD   05/08/2020 7:54 AM

## 2020-05-11 ENCOUNTER — Telehealth: Payer: Self-pay | Admitting: *Deleted

## 2020-05-11 ENCOUNTER — Inpatient Hospital Stay: Payer: Self-pay

## 2020-05-11 ENCOUNTER — Inpatient Hospital Stay: Payer: Self-pay | Admitting: Oncology

## 2020-05-11 DIAGNOSIS — R898 Other abnormal findings in specimens from other organs, systems and tissues: Secondary | ICD-10-CM

## 2020-05-11 NOTE — Telephone Encounter (Signed)
Patient called reporting that he just found out that he has been exposed to [place] and his PCP told him to isolate and call us to see what to do about his appointment today

## 2020-05-11 NOTE — Telephone Encounter (Signed)
Spoke with pt and he stated he was exposed to covid.  Pt denies any symptoms.  Instructed pt to have covid test done and call clinic with results.  If he test negative he can come to appt that was rescheduled for 05/18/20 if he test positive he will have to wait 21 days before coming into clinic because of our immunocompromised patient population.  Pt instructed he could go to local pharmacies and other drive thru testing sites in [place] as well as given [place] # for testing.  Pt verbalized he would schedule test and let us know the results as soon as possible.

## 2020-05-15 NOTE — Progress Notes (Deleted)
[place]  Telephone:(336) [phone] Fax:(336) [phone]  ID: Henry Owens OB: [birth year]  MR#: [id number]  RXV#:[id number]  Patient Care Team: Albina Billet, MD as PCP - General (Internal Medicine)  CHIEF COMPLAINT: Abnormal bone marrow signal on imaging.  INTERVAL HISTORY: ***  REVIEW OF SYSTEMS:   ROS  As per HPI. Otherwise, a complete review of systems is negative.  PAST MEDICAL HISTORY: Past Medical History:  Diagnosis Date  . Anxiety   . Arthritis   . Flu 07/2017    PAST SURGICAL HISTORY: Past Surgical History:  Procedure Laterality Date  . APPENDECTOMY  1975  . BACK SURGERY    . KNEE ARTHROSCOPY WITH MEDIAL MENISECTOMY Right 08/20/2017   Procedure: KNEE ARTHROSCOPY WITH PARTIAL MEDIAL AND LATERAL MENISECTOMY,PARTIAL SYNOVECTOMY,CHONDROPLASTY, LOOSE BODY REMOVAL;  Surgeon: Lovell Sheehan, MD;  Location: [facility] OR[facility];  Service: Orthopedics;  Laterality: Right;  . [place], 2000    FAMILY HISTORY: Family History  Problem Relation Age of Onset  . Bladder Cancer Neg Hx   . Prostate cancer Neg Hx   . Hematuria Neg Hx     ADVANCED DIRECTIVES (Y/N):  N  HEALTH MAINTENANCE: Social History   Tobacco Use  . Smoking status: Current Every Day Smoker    Packs/day: 1.50    Years: 25.00    Pack years: 37.50    Types: Cigarettes  . Smokeless tobacco: Never Used  Vaping Use  . Vaping Use: Never used  Substance Use Topics  . Alcohol use: No  . 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 Azithromycin Rash  . Penicillin G Rash    Has patient had a PCN reaction causing immediate rash, facial/tongue/throat swelling, SOB or lightheadedness with hypotension: Yes Has patient had a PCN reaction causing severe rash involving mucus membranes or skin necrosis: No Has patient had a PCN reaction that  required hospitalization: No Has patient had a PCN reaction occurring within the last 10 years: No If all of the above answers are "NO", then may proceed with Cephalosporin use.     Current Outpatient Medications  Medication Sig Dispense Refill  . ALPRAZolam (XANAX) 1 MG tablet Take 1 mg by mouth 3 (three) times daily as needed for anxiety.     . fluticasone (FLONASE) 50 MCG/ACT nasal spray Place 1 spray into both nostrils daily as needed for allergies or rhinitis.    . Tetrahydrozoline HCl (VISINE OP) Place 1 drop into both eyes daily as needed (allergies).     No current facility-administered medications for this visit.    OBJECTIVE: There were no vitals filed for this visit.   There is no height or weight on file to calculate BMI.    ECOG FS:{CHL ONC [id number]  General: Well-developed, well-nourished, no acute distress. Eyes: Pink conjunctiva, anicteric sclera. HEENT: Normocephalic, moist mucous membranes. Lungs: No audible wheezing or coughing. Heart: Regular rate and rhythm. Abdomen: Soft, nontender, no obvious distention. Musculoskeletal: No edema, cyanosis, or clubbing. Neuro: Alert, answering all questions appropriately. Cranial nerves grossly intact. Skin: No rashes or petechiae noted. Psych: Normal affect. Lymphatics: No cervical, calvicular, axillary or inguinal LAD.   LAB RESULTS:  No results found for: NA, K, CL, CO2, GLUCOSE, BUN, CREATININE, CALCIUM, PROT, ALBUMIN, AST, ALT, ALKPHOS, BILITOT, GFRNONAA, GFRAA  No results found for: WBC, NEUTROABS, HGB, HCT, MCV, PLT   STUDIES: No results found.  ASSESSMENT: Abnormal bone marrow signal on imaging.  PLAN:    1. Abnormal bone marrow signal on imaging:  Patient expressed understanding and was in agreement with this plan. He also understands that He can call clinic at any time with any questions, concerns, or complaints.   Cancer Staging No matching staging information was found for the  patient.  Lloyd Huger, MD   05/15/2020 1:57 PM

## 2020-05-17 ENCOUNTER — Telehealth: Payer: Self-pay | Admitting: *Deleted

## 2020-05-17 NOTE — Telephone Encounter (Signed)
Patient called to report that he still does not have Covid results. He is not on the schedule for treatment and will call when he gets resuplts.

## 2020-05-18 ENCOUNTER — Inpatient Hospital Stay: Payer: Self-pay | Admitting: Oncology

## 2020-05-18 ENCOUNTER — Inpatient Hospital Stay: Payer: Self-pay

## 2020-05-18 DIAGNOSIS — R898 Other abnormal findings in specimens from other organs, systems and tissues: Secondary | ICD-10-CM

## 2020-05-25 ENCOUNTER — Inpatient Hospital Stay: Payer: Self-pay

## 2020-05-25 ENCOUNTER — Inpatient Hospital Stay: Payer: Self-pay | Admitting: Oncology

## 2020-06-05 NOTE — Progress Notes (Signed)
[facility] [place]  Telephone:(336) [phone] Fax:(336) [phone]  ID: Anthem Frazer OB: 1961/01/21  MR#: [id number]  TTS#:[id number]  Patient Care Team: Albina Billet, MD as PCP - General (Internal Medicine)  CHIEF COMPLAINT: Abnormal bone marrow signal on imaging.  INTERVAL HISTORY: Patient is a 60 year old male who was noted to have an abnormal bone marrow signal on MRI of his lower spine.  His initial visit was delayed several weeks secondary to Covid infection.  He has chronic back pain, but otherwise feels well.  He has no neurologic complaints.  He denies any recent fevers or illnesses.  He has a good appetite and denies weight loss.  He has no chest pain, shortness of breath, cough, or hemoptysis.  He denies any nausea, vomiting, constipation, or diarrhea.  He has no urinary complaints.  Patient offers no further specific complaints today.  REVIEW OF SYSTEMS:   Review of Systems  Constitutional: Negative.  Negative for fever, malaise/fatigue and weight loss.  Respiratory: Negative.  Negative for cough, hemoptysis and shortness of breath.   Cardiovascular: Negative.  Negative for chest pain and leg swelling.  Gastrointestinal: Negative.  Negative for abdominal pain.  Genitourinary: Negative.  Negative for dysuria.  Musculoskeletal: Positive for back pain.  Skin: Negative.  Negative for rash.  Neurological: Negative.  Negative for dizziness, focal weakness, weakness and headaches.  Psychiatric/Behavioral: Negative.  The patient is not nervous/anxious.     As per HPI. Otherwise, a complete review of systems is negative.  PAST MEDICAL HISTORY: Past Medical History:  Diagnosis Date  . Anxiety   . Arthritis   . Flu 07/2017    PAST SURGICAL HISTORY: Past Surgical History:  Procedure Laterality Date  . APPENDECTOMY  1975  . BACK SURGERY    . KNEE ARTHROSCOPY WITH MEDIAL MENISECTOMY Right 08/20/2017   Procedure: KNEE ARTHROSCOPY WITH PARTIAL MEDIAL AND LATERAL  MENISECTOMY,PARTIAL SYNOVECTOMY,CHONDROPLASTY, LOOSE BODY REMOVAL;  Surgeon: Lovell Sheehan, MD;  Location: [facility] ORS;  Service: Orthopedics;  Laterality: Right;  . [place], 2000    FAMILY HISTORY: Family History  Problem Relation Age of Onset  . Bladder Cancer Neg Hx   . Prostate cancer Neg Hx   . Hematuria Neg Hx     ADVANCED DIRECTIVES (Y/N):  N  HEALTH MAINTENANCE: Social History   Tobacco Use  . Smoking status: Current Every Day Smoker    Packs/day: 1.50    Years: 25.00    Pack years: 37.50    Types: Cigarettes  . Smokeless tobacco: Never Used  Vaping Use  . Vaping Use: Never used  Substance Use Topics  . Alcohol use: No  . Drug use: Not Currently     Colonoscopy:  PAP:  Bone density:  Lipid panel:  Allergies  Allergen Reactions  . Azithromycin Rash  . Penicillin G Rash    Has patient had a PCN reaction causing immediate rash, facial/tongue/throat swelling, SOB or lightheadedness with hypotension: Yes Has patient had a PCN reaction causing severe rash involving mucus membranes or skin necrosis: No Has patient had a PCN reaction that required hospitalization: No Has patient had a PCN reaction occurring within the last 10 years: No If all of the above answers are "NO", then may proceed with Cephalosporin use.     Current Outpatient Medications  Medication Sig Dispense Refill  . ALPRAZolam (XANAX) 1 MG tablet Take 1 mg by mouth 3 (three) times daily as needed for anxiety.     [name]  fluticasone (FLONASE) 50 MCG/ACT nasal spray Place 1 spray into both nostrils daily as needed for allergies or rhinitis.    [name] HYDROcodone-acetaminophen (NORCO) 7.5-325 MG tablet hydrocodone 7.5 mg-acetaminophen 325 mg tablet    . lisinopril (ZESTRIL) 10 MG tablet Take 10 mg by mouth daily.    . sertraline (ZOLOFT) 100 MG tablet Take 150 mg by mouth daily.    . Tetrahydrozoline HCl (VISINE OP) Place 1 drop into both eyes daily as needed (allergies).     No current  facility-administered medications for this visit.    OBJECTIVE: Vitals:   06/10/20 1502  BP: (!) 146/102  Pulse: (!) 116  Resp: 20  Temp: 97.8 F (36.6 C)  SpO2: 100%     Body mass index is 25.6 kg/m.    ECOG FS:0 - Asymptomatic  General: Well-developed, well-nourished, no acute distress. Eyes: Pink conjunctiva, anicteric sclera. HEENT: Normocephalic, moist mucous membranes. Lungs: No audible wheezing or coughing. Heart: Regular rate and rhythm. Abdomen: Soft, nontender, no obvious distention. Musculoskeletal: No edema, cyanosis, or clubbing. Neuro: Alert, answering all questions appropriately. Cranial nerves grossly intact. Skin: No rashes or petechiae noted. Psych: Normal affect. Lymphatics: No cervical, calvicular, axillary or inguinal LAD.   LAB RESULTS:  Lab Results  Component Value Date   NA 136 06/10/2020   K 3.9 06/10/2020   CL 105 06/10/2020   CO2 21 (L) 06/10/2020   GLUCOSE 109 (H) 06/10/2020   BUN 6 06/10/2020   CREATININE 1.04 06/10/2020   CALCIUM 9.3 06/10/2020   PROT 7.8 06/10/2020   ALBUMIN 4.9 06/10/2020   AST 22 06/10/2020   ALT 31 06/10/2020   ALKPHOS 207 (H) 06/10/2020   BILITOT 0.7 06/10/2020   GFRNONAA >60 06/10/2020    Lab Results  Component Value Date   WBC 8.9 06/10/2020   NEUTROABS 6.4 06/10/2020   HGB 16.3 06/10/2020   HCT 46.1 06/10/2020   MCV 95.4 06/10/2020   PLT 182 06/10/2020     STUDIES: No results found.  ASSESSMENT: Abnormal bone marrow signal on imaging.  PLAN:    1. Abnormal bone marrow signal on imaging: Appears to be nonpathologic.  Patient CBC is within normal limits.  He has a mildly decreased B12 level and increased iron saturation ratio which are likely clinically insignificant.  Intelli[facility] myeloid panel, peripheral blood flow cytometry, and SPEP were drawn for completeness and are pending at time of dictation.  No intervention is needed at this time.  Patient will have video assisted telemedicine visit in  approximately 3 weeks to discuss the results. 2.  B12 deficiency: Will recommend oral B12 supplementation.  I spent a total of 45 minutes reviewing chart data, face-to-face evaluation with the patient, counseling and coordination of care as detailed above.   Patient expressed understanding and was in agreement with this plan. He also understands that He can call clinic at any time with any questions, concerns, or complaints.    Lloyd Huger, MD   06/12/2020 8:20 AM

## 2020-06-10 ENCOUNTER — Inpatient Hospital Stay: Payer: Medicaid Other

## 2020-06-10 ENCOUNTER — Inpatient Hospital Stay: Payer: Medicaid Other | Attending: Oncology | Admitting: Oncology

## 2020-06-10 ENCOUNTER — Encounter: Payer: Self-pay | Admitting: Oncology

## 2020-06-10 VITALS — BP 146/102 | HR 116 | Temp 97.8°F | Resp 20 | Wt 178.4 lb

## 2020-06-10 DIAGNOSIS — F17210 Nicotine dependence, cigarettes, uncomplicated: Secondary | ICD-10-CM

## 2020-06-10 DIAGNOSIS — E538 Deficiency of other specified B group vitamins: Secondary | ICD-10-CM | POA: Insufficient documentation

## 2020-06-10 DIAGNOSIS — R937 Abnormal findings on diagnostic imaging of other parts of musculoskeletal system: Secondary | ICD-10-CM | POA: Insufficient documentation

## 2020-06-10 DIAGNOSIS — R898 Other abnormal findings in specimens from other organs, systems and tissues: Secondary | ICD-10-CM

## 2020-06-10 DIAGNOSIS — Z79899 Other long term (current) drug therapy: Secondary | ICD-10-CM | POA: Insufficient documentation

## 2020-06-10 DIAGNOSIS — Z8616 Personal history of COVID-19: Secondary | ICD-10-CM | POA: Insufficient documentation

## 2020-06-10 LAB — CBC WITH DIFFERENTIAL/PLATELET
Abs Immature Granulocytes: 0.01 10*3/uL (ref 0.00–0.07)
Basophils Absolute: 0 10*3/uL (ref 0.0–0.1)
Basophils Relative: 0 %
Eosinophils Absolute: 0.1 10*3/uL (ref 0.0–0.5)
Eosinophils Relative: 1 %
HCT: 46.1 % (ref 39.0–52.0)
Hemoglobin: 16.3 g/dL (ref 13.0–17.0)
Immature Granulocytes: 0 %
Lymphocytes Relative: 21 %
Lymphs Abs: 1.9 10*3/uL (ref 0.7–4.0)
MCH: 33.7 pg (ref 26.0–34.0)
MCHC: 35.4 g/dL (ref 30.0–36.0)
MCV: 95.4 fL (ref 80.0–100.0)
Monocytes Absolute: 0.5 10*3/uL (ref 0.1–1.0)
Monocytes Relative: 6 %
Neutro Abs: 6.4 10*3/uL (ref 1.7–7.7)
Neutrophils Relative %: 72 %
Platelets: 182 10*3/uL (ref 150–400)
RBC: 4.83 MIL/uL (ref 4.22–5.81)
RDW: 13.1 % (ref 11.5–15.5)
WBC: 8.9 10*3/uL (ref 4.0–10.5)
nRBC: 0 % (ref 0.0–0.2)

## 2020-06-10 LAB — COMPREHENSIVE METABOLIC PANEL
ALT: 31 U/L (ref 0–44)
AST: 22 U/L (ref 15–41)
Albumin: 4.9 g/dL (ref 3.5–5.0)
Alkaline Phosphatase: 207 U/L — ABNORMAL HIGH (ref 38–126)
Anion gap: 10 (ref 5–15)
BUN: 6 mg/dL (ref 6–20)
CO2: 21 mmol/L — ABNORMAL LOW (ref 22–32)
Calcium: 9.3 mg/dL (ref 8.9–10.3)
Chloride: 105 mmol/L (ref 98–111)
Creatinine, Ser: 1.04 mg/dL (ref 0.61–1.24)
GFR, Estimated: >60 mL/min (ref >60)
Glucose, Bld: 109 mg/dL — ABNORMAL HIGH (ref 70–99)
Potassium: 3.9 mmol/L (ref 3.5–5.1)
Sodium: 136 mmol/L (ref 135–145)
Total Bilirubin: 0.7 mg/dL (ref 0.3–1.2)
Total Protein: 7.8 g/dL (ref 6.5–8.1)

## 2020-06-10 LAB — IRON AND TIBC
Iron: 116 ug/dL (ref 45–182)
Saturation Ratios: 40 % — ABNORMAL HIGH (ref 17.9–39.5)
TIBC: 293 ug/dL (ref 250–450)
UIBC: 177 ug/dL

## 2020-06-10 LAB — FOLATE: Folate: 6.2 ng/mL (ref >5.9)

## 2020-06-10 LAB — FERRITIN: Ferritin: 236 ng/mL (ref 24–336)

## 2020-06-10 LAB — LACTATE DEHYDROGENASE: LDH: 114 U/L (ref 98–192)

## 2020-06-10 LAB — VITAMIN B12: Vitamin B-12: 170 pg/mL — ABNORMAL LOW (ref 180–914)
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
## 2020-06-14 ENCOUNTER — Telehealth: Payer: Self-pay | Admitting: *Deleted

## 2020-06-14 LAB — PROTEIN ELECTROPHORESIS, SERUM
A/G Ratio: 1.3 (ref 0.7–1.7)
Albumin ELP: 4.4 g/dL (ref 2.9–4.4)
Alpha-1-Globulin: 0.3 g/dL (ref 0.0–0.4)
Alpha-2-Globulin: 0.9 g/dL (ref 0.4–1.0)
Beta Globulin: 1.2 g/dL (ref 0.7–1.3)
Gamma Globulin: 0.9 g/dL (ref 0.4–1.8)
Globulin, Total: 3.4 g/dL (ref 2.2–3.9)
Total Protein ELP: 7.8 g/dL (ref 6.0–8.5)

## 2020-06-14 LAB — COMP PANEL: LEUKEMIA/LYMPHOMA

## 2020-06-14 NOTE — Telephone Encounter (Signed)
Patient called reporting that he is scheduled for an EMG on 06/24/20 and he is asking if he needs to reschedule that until after your results are back. Neurologist deferred this decision to you. Please advise

## 2020-06-15 NOTE — Telephone Encounter (Signed)
No, keep EMG as scheduled.

## 2020-06-15 NOTE — Telephone Encounter (Signed)
I left message on patient voice mail to keep EMG appointment as scheduled

## 2020-06-25 LAB — INTELLIGEN MYELOID

## 2020-06-26 NOTE — Progress Notes (Signed)
[facility]  [phone] [phone]  ID: Henry Owens OB: Mar 02, 1961  MR#: [id number]  DSK#:[id number]  Patient Care Team: Albina Billet, MD as PCP - General (Internal Medicine)  I connected with Rana Snare on 07/02/20 at  3:00 PM EST by video enabled telemedicine visit and verified that I am speaking with the correct person using two identifiers.   I discussed the limitations, risks, security and privacy concerns of performing an evaluation and management service by telemedicine and the availability of in-person appointments. I also discussed with the patient that there may be a patient responsible charge related to this service. The patient expressed understanding and agreed to proceed.   Other persons participating in the visit and their role in the encounter: Patient, MD.  Patients location: Home. Providers location: Clinic.  CHIEF COMPLAINT: Abnormal bone marrow signal on imaging.  INTERVAL HISTORY: Patient agreed to video assisted telemedicine visit for further evaluation and discussion of his imaging results.  He continues to have significant low back pain and has an appointment with neurosurgery in the near future.  He has several other medical complaints that are also all chronic and unchanged.  He has no neurologic complaints.  He denies any recent fevers or illnesses.  He has a good appetite and denies weight loss.  He has no chest pain, shortness of breath, cough, or hemoptysis.  He denies any nausea, vomiting, constipation, or diarrhea.  He has no urinary complaints.  Patient offers no further specific complaints today.  REVIEW OF SYSTEMS:   Review of Systems  Constitutional: Negative.  Negative for fever, malaise/fatigue and weight loss.  Respiratory: Negative.  Negative for cough, hemoptysis and shortness of breath.   Cardiovascular: Negative.  Negative for chest pain and leg swelling.  Gastrointestinal: Negative.   Negative for abdominal pain.  Genitourinary: Negative.  Negative for dysuria.  Musculoskeletal: Positive for back pain.  Skin: Negative.  Negative for rash.  Neurological: Negative.  Negative for dizziness, focal weakness, weakness and headaches.  Psychiatric/Behavioral: Negative.  The patient is not nervous/anxious.     As per HPI. Otherwise, a complete review of systems is negative.  PAST MEDICAL HISTORY: Past Medical History:  Diagnosis Date   Anxiety    Arthritis    Flu 07/2017    PAST SURGICAL HISTORY: Past Surgical History:  Procedure Laterality Date   APPENDECTOMY  1975   BACK SURGERY     KNEE ARTHROSCOPY WITH MEDIAL MENISECTOMY Right 08/20/2017   Procedure: KNEE ARTHROSCOPY WITH PARTIAL MEDIAL AND LATERAL MENISECTOMY,PARTIAL SYNOVECTOMY,CHONDROPLASTY, LOOSE BODY REMOVAL;  Surgeon: Lovell Sheehan, MD;  Location: [facility] ORS;  Service: Orthopedics;  Laterality: Right;   [place], 2000    FAMILY HISTORY: Family History  Problem Relation Age of Onset   Bladder Cancer Neg Hx    Prostate cancer Neg Hx    Hematuria Neg Hx     ADVANCED DIRECTIVES (Y/N):  N  HEALTH MAINTENANCE: Social History   Tobacco Use   Smoking status: Current Every Day Smoker    Packs/day: 1.50    Years: 25.00    Pack years: 37.50    Types: Cigarettes   Smokeless tobacco: Never Used  Scientific laboratory technician Use: Never used  Substance Use Topics   Alcohol use: No   Drug use: Not Currently     Colonoscopy:  PAP:  Bone density:  Lipid panel:  Allergies  Allergen Reactions   Azithromycin Rash  Penicillin G Rash    Has patient had a PCN reaction causing immediate rash, facial/tongue/throat swelling, SOB or lightheadedness with hypotension: Yes Has patient had a PCN reaction causing severe rash involving mucus membranes or skin necrosis: No Has patient had a PCN reaction that required hospitalization: No Has patient had a PCN reaction occurring within the  last 10 years: No If all of the above answers are "NO", then may proceed with Cephalosporin use.     Current Outpatient Medications  Medication Sig Dispense Refill   ALPRAZolam (XANAX) 1 MG tablet Take 1 mg by mouth 3 (three) times daily as needed for anxiety.      Cyanocobalamin (VITAMIN B 12 PO) Take by mouth.     docusate sodium (COLACE) 50 MG capsule Take 50 mg by mouth 2 (two) times daily.     fluticasone (FLONASE) 50 MCG/ACT nasal spray Place 1 spray into both nostrils daily as needed for allergies or rhinitis.     HYDROcodone-acetaminophen (NORCO) 7.5-325 MG tablet hydrocodone 7.5 mg-acetaminophen 325 mg tablet     lisinopril (ZESTRIL) 10 MG tablet Take 10 mg by mouth daily.     sertraline (ZOLOFT) 100 MG tablet Take 150 mg by mouth daily.     Tetrahydrozoline HCl (VISINE OP) Place 1 drop into both eyes daily as needed (allergies).     tiZANidine (ZANAFLEX) 4 MG capsule Take 4 mg by mouth 3 (three) times daily.     Turmeric (QC TUMERIC COMPLEX PO) Take by mouth.     No current facility-administered medications for this visit.    OBJECTIVE: There were no vitals filed for this visit.   There is no height or weight on file to calculate BMI.    ECOG FS:0 - Asymptomatic  General: Well-developed, well-nourished, no acute distress. HEENT: Normocephalic. Neuro: Alert, answering all questions appropriately. Cranial nerves grossly intact. Psych: Normal affect.  LAB RESULTS:  Lab Results  Component Value Date   NA 136 06/10/2020   K 3.9 06/10/2020   CL 105 06/10/2020   CO2 21 (L) 06/10/2020   GLUCOSE 109 (H) 06/10/2020   BUN 6 06/10/2020   CREATININE 1.04 06/10/2020   CALCIUM 9.3 06/10/2020   PROT 7.8 06/10/2020   ALBUMIN 4.9 06/10/2020   AST 22 06/10/2020   ALT 31 06/10/2020   ALKPHOS 207 (H) 06/10/2020   BILITOT 0.7 06/10/2020   GFRNONAA >60 06/10/2020    Lab Results  Component Value Date   WBC 8.9 06/10/2020   NEUTROABS 6.4 06/10/2020   HGB 16.3  06/10/2020   HCT 46.1 06/10/2020   MCV 95.4 06/10/2020   PLT 182 06/10/2020     STUDIES: No results found.  ASSESSMENT: Abnormal bone marrow signal on imaging.  PLAN:    1. Abnormal bone marrow signal on imaging: Appears to be nonpathologic.  All of patient's laboratory work except for a mildly decreased B12 level is either negative or within normal limits including IntelliGEN myeloid panel, peripheral blood flow cytometry, and SPEP.  No intervention is needed at this time.  Patient does not require bone marrow biopsy.  After lengthy discussion with the patient, is agreed upon that no further follow-up is necessary.  2.  B12 deficiency: I recommended oral B12 supplementation. 3.  Back pain: Continue follow-up with neurosurgery and primary care as scheduled.   I provided 20 minutes of face-to-face video visit time during this encounter which included chart review, counseling, and coordination of care as documented above.   Patient expressed understanding and was  in agreement with this plan. He also understands that He can call clinic at any time with any questions, concerns, or complaints.    Lloyd Huger, MD   07/02/2020 12:39 PM

## 2020-06-30 ENCOUNTER — Telehealth: Payer: Self-pay | Admitting: *Deleted

## 2020-06-30 NOTE — Telephone Encounter (Signed)
Patient called to discuss his many health issues prior to his telephone visit. They are as follows: 1 episode of night sweats a month ago 2 On 3/4 he was febrile 102 and has had a low grade fever ever since today it was 99.4 3. He has been having intermittent diarrhea and loose watery stool through out the day since 3/4. 4/ On going left sided H/A 5. Back pain at a level 7 today.

## 2020-06-30 NOTE — Telephone Encounter (Signed)
Recommend discussing with his PCP.  His workup is negative to date, so unrelated.

## 2020-07-01 ENCOUNTER — Inpatient Hospital Stay: Payer: Self-pay | Attending: Oncology | Admitting: Oncology

## 2020-07-01 ENCOUNTER — Encounter: Payer: Self-pay | Admitting: Oncology

## 2020-07-01 DIAGNOSIS — R898 Other abnormal findings in specimens from other organs, systems and tissues: Secondary | ICD-10-CM

## 2020-07-01 NOTE — Progress Notes (Signed)
Patient here for oncology follow-up appointment, expresses concerns of diarrhea, fever, numbness and back pain

## 2021-04-01 DIAGNOSIS — F17200 Nicotine dependence, unspecified, uncomplicated: Secondary | ICD-10-CM | POA: Insufficient documentation

## 2021-07-05 ENCOUNTER — Emergency Department: Payer: Medicaid Other

## 2021-07-05 ENCOUNTER — Inpatient Hospital Stay
Admission: EM | Admit: 2021-07-05 | Discharge: 2021-07-11 | DRG: 871 | Disposition: A | Discharge status: Home / Self Care | Payer: Medicaid Other | Attending: Hospitalist | Admitting: Hospitalist

## 2021-07-05 ENCOUNTER — Other Ambulatory Visit: Payer: Self-pay

## 2021-07-05 ENCOUNTER — Encounter: Payer: Self-pay | Admitting: Radiology

## 2021-07-05 DIAGNOSIS — R0602 Shortness of breath: Secondary | ICD-10-CM

## 2021-07-05 DIAGNOSIS — R739 Hyperglycemia, unspecified: Secondary | ICD-10-CM | POA: Diagnosis present

## 2021-07-05 DIAGNOSIS — U071 COVID-19: Secondary | ICD-10-CM | POA: Diagnosis not present

## 2021-07-05 DIAGNOSIS — Z79899 Other long term (current) drug therapy: Secondary | ICD-10-CM | POA: Diagnosis not present

## 2021-07-05 DIAGNOSIS — R339 Retention of urine, unspecified: Secondary | ICD-10-CM | POA: Diagnosis not present

## 2021-07-05 DIAGNOSIS — Z880 Allergy status to penicillin: Secondary | ICD-10-CM

## 2021-07-05 DIAGNOSIS — E875 Hyperkalemia: Secondary | ICD-10-CM | POA: Diagnosis present

## 2021-07-05 DIAGNOSIS — J9602 Acute respiratory failure with hypercapnia: Secondary | ICD-10-CM | POA: Diagnosis present

## 2021-07-05 DIAGNOSIS — Z4659 Encounter for fitting and adjustment of other gastrointestinal appliance and device: Secondary | ICD-10-CM

## 2021-07-05 DIAGNOSIS — I959 Hypotension, unspecified: Secondary | ICD-10-CM | POA: Diagnosis not present

## 2021-07-05 DIAGNOSIS — M1990 Unspecified osteoarthritis, unspecified site: Secondary | ICD-10-CM | POA: Diagnosis present

## 2021-07-05 DIAGNOSIS — Z8701 Personal history of pneumonia (recurrent): Secondary | ICD-10-CM

## 2021-07-05 DIAGNOSIS — R6520 Severe sepsis without septic shock: Secondary | ICD-10-CM | POA: Diagnosis present

## 2021-07-05 DIAGNOSIS — Z20822 Contact with and (suspected) exposure to covid-19: Secondary | ICD-10-CM | POA: Diagnosis present

## 2021-07-05 DIAGNOSIS — M545 Low back pain, unspecified: Secondary | ICD-10-CM | POA: Diagnosis present

## 2021-07-05 DIAGNOSIS — F17210 Nicotine dependence, cigarettes, uncomplicated: Secondary | ICD-10-CM | POA: Diagnosis present

## 2021-07-05 DIAGNOSIS — A419 Sepsis, unspecified organism: Secondary | ICD-10-CM | POA: Diagnosis present

## 2021-07-05 DIAGNOSIS — N179 Acute kidney failure, unspecified: Secondary | ICD-10-CM | POA: Diagnosis present

## 2021-07-05 DIAGNOSIS — G9341 Metabolic encephalopathy: Secondary | ICD-10-CM | POA: Diagnosis present

## 2021-07-05 DIAGNOSIS — D472 Monoclonal gammopathy: Secondary | ICD-10-CM | POA: Diagnosis present

## 2021-07-05 DIAGNOSIS — J9600 Acute respiratory failure, unspecified whether with hypoxia or hypercapnia: Secondary | ICD-10-CM | POA: Diagnosis not present

## 2021-07-05 DIAGNOSIS — G8929 Other chronic pain: Secondary | ICD-10-CM | POA: Diagnosis present

## 2021-07-05 DIAGNOSIS — D6959 Other secondary thrombocytopenia: Secondary | ICD-10-CM | POA: Diagnosis present

## 2021-07-05 DIAGNOSIS — I10 Essential (primary) hypertension: Secondary | ICD-10-CM | POA: Diagnosis present

## 2021-07-05 DIAGNOSIS — J9601 Acute respiratory failure with hypoxia: Secondary | ICD-10-CM | POA: Diagnosis present

## 2021-07-05 DIAGNOSIS — J189 Pneumonia, unspecified organism: Secondary | ICD-10-CM | POA: Diagnosis present

## 2021-07-05 DIAGNOSIS — R0902 Hypoxemia: Principal | ICD-10-CM

## 2021-07-05 DIAGNOSIS — Z881 Allergy status to other antibiotic agents status: Secondary | ICD-10-CM | POA: Diagnosis not present

## 2021-07-05 DIAGNOSIS — Z978 Presence of other specified devices: Secondary | ICD-10-CM

## 2021-07-05 DIAGNOSIS — F419 Anxiety disorder, unspecified: Secondary | ICD-10-CM | POA: Diagnosis present

## 2021-07-05 LAB — BRAIN NATRIURETIC PEPTIDE: B Natriuretic Peptide: 91.4 pg/mL (ref 0.0–100.0)

## 2021-07-05 LAB — COMPREHENSIVE METABOLIC PANEL
ALT: 14 U/L (ref 0–44)
AST: 14 U/L — ABNORMAL LOW (ref 15–41)
Albumin: 4 g/dL (ref 3.5–5.0)
Alkaline Phosphatase: 179 U/L — ABNORMAL HIGH (ref 38–126)
Anion gap: 3 — ABNORMAL LOW (ref 5–15)
BUN: 11 mg/dL (ref 6–20)
CO2: 30 mmol/L (ref 22–32)
Calcium: 8.8 mg/dL — ABNORMAL LOW (ref 8.9–10.3)
Chloride: 102 mmol/L (ref 98–111)
Creatinine, Ser: 1.29 mg/dL — ABNORMAL HIGH (ref 0.61–1.24)
GFR, Estimated: >60 mL/min (ref >60)
Glucose, Bld: 128 mg/dL — ABNORMAL HIGH (ref 70–99)
Potassium: 4.6 mmol/L (ref 3.5–5.1)
Sodium: 135 mmol/L (ref 135–145)
Total Bilirubin: 0.7 mg/dL (ref 0.3–1.2)
Total Protein: 7.8 g/dL (ref 6.5–8.1)

## 2021-07-05 LAB — CBC
HCT: 43.4 % (ref 39.0–52.0)
Hemoglobin: 14 g/dL (ref 13.0–17.0)
MCH: 31 pg (ref 26.0–34.0)
MCHC: 32.3 g/dL (ref 30.0–36.0)
MCV: 96 fL (ref 80.0–100.0)
Platelets: 174 10*3/uL (ref 150–400)
RBC: 4.52 MIL/uL (ref 4.22–5.81)
RDW: 13.5 % (ref 11.5–15.5)
WBC: 7 10*3/uL (ref 4.0–10.5)
nRBC: 0 % (ref 0.0–0.2)

## 2021-07-05 LAB — RESP PANEL BY RT-PCR (FLU A&B, COVID) ARPGX2
Influenza A by PCR: NEGATIVE
Influenza B by PCR: NEGATIVE
SARS Coronavirus 2 by RT PCR: NEGATIVE

## 2021-07-05 LAB — TROPONIN I (HIGH SENSITIVITY): Troponin I (High Sensitivity): 31 ng/L — ABNORMAL HIGH (ref <18)

## 2021-07-05 MED ORDER — SODIUM CHLORIDE 0.9 % IV SOLN
200.0000 mg | Freq: Once | INTRAVENOUS | Status: AC
  Administered 2021-07-06: 200 mg via INTRAVENOUS
  Filled 2021-07-05: qty 40

## 2021-07-05 MED ORDER — INSULIN ASPART 100 UNIT/ML IJ SOLN
0.0000 [IU] | INTRAMUSCULAR | Status: DC
  Administered 2021-07-06: 3 [IU] via SUBCUTANEOUS
  Administered 2021-07-06: 2 [IU] via SUBCUTANEOUS
  Administered 2021-07-06: 3 [IU] via SUBCUTANEOUS
  Administered 2021-07-06: 2 [IU] via SUBCUTANEOUS
  Administered 2021-07-06 (×2): 3 [IU] via SUBCUTANEOUS
  Administered 2021-07-06: 5 [IU] via SUBCUTANEOUS
  Administered 2021-07-07: 2 [IU] via SUBCUTANEOUS
  Administered 2021-07-07 (×2): 3 [IU] via SUBCUTANEOUS
  Administered 2021-07-07: 2 [IU] via SUBCUTANEOUS
  Administered 2021-07-07 – 2021-07-08 (×3): 3 [IU] via SUBCUTANEOUS
  Administered 2021-07-08 (×3): 2 [IU] via SUBCUTANEOUS
  Filled 2021-07-05 (×17): qty 1

## 2021-07-05 MED ORDER — MIDAZOLAM HCL 2 MG/2ML IJ SOLN
INTRAMUSCULAR | Status: AC
Start: 2021-07-05 — End: 2021-07-06
  Filled 2021-07-05: qty 2

## 2021-07-05 MED ORDER — NOREPINEPHRINE 4 MG/250ML-% IV SOLN
INTRAVENOUS | Status: AC
Start: 2021-07-05 — End: 2021-07-06
  Filled 2021-07-05: qty 250

## 2021-07-05 MED ORDER — SUCCINYLCHOLINE CHLORIDE 200 MG/10ML IV SOSY
PREFILLED_SYRINGE | INTRAVENOUS | Status: AC
  Administered 2021-07-05: 120 mg via INTRAVENOUS
  Filled 2021-07-05: qty 10

## 2021-07-05 MED ORDER — POLYETHYLENE GLYCOL 3350 17 G PO PACK: 17.0000 g | PACK | Freq: Every day | ORAL | Status: DC | PRN

## 2021-07-05 MED ORDER — DEXAMETHASONE SODIUM PHOSPHATE 10 MG/ML IJ SOLN: 6.0000 mg | INTRAMUSCULAR | Status: DC

## 2021-07-05 MED ORDER — FENTANYL BOLUS VIA INFUSION
50.0000 ug | INTRAVENOUS | Status: DC | PRN
  Filled 2021-07-05: qty 100

## 2021-07-05 MED ORDER — PANTOPRAZOLE SODIUM 40 MG IV SOLR
40.0000 mg | Freq: Every day | INTRAVENOUS | Status: DC
  Administered 2021-07-06 – 2021-07-10 (×6): 40 mg via INTRAVENOUS
  Filled 2021-07-05 (×6): qty 10

## 2021-07-05 MED ORDER — IOHEXOL 350 MG/ML SOLN
75.0000 mL | Freq: Once | INTRAVENOUS | Status: AC | PRN
  Administered 2021-07-05: 75 mL via INTRAVENOUS

## 2021-07-05 MED ORDER — LACTATED RINGERS IV BOLUS
1000.0000 mL | Freq: Once | INTRAVENOUS | Status: AC
  Administered 2021-07-05: 1000 mL via INTRAVENOUS

## 2021-07-05 MED ORDER — FENTANYL 2500MCG IN NS 250ML (10MCG/ML) PREMIX INFUSION: 50.0000 ug/h | INTRAVENOUS | Status: DC

## 2021-07-05 MED ORDER — ACETAMINOPHEN 325 MG RE SUPP
650.0000 mg | Freq: Once | RECTAL | Status: AC
  Administered 2021-07-05: 650 mg via RECTAL
  Filled 2021-07-05: qty 2

## 2021-07-05 MED ORDER — CHLORHEXIDINE GLUCONATE CLOTH 2 % EX PADS
6.0000 | MEDICATED_PAD | Freq: Every day | CUTANEOUS | Status: DC
  Administered 2021-07-06 – 2021-07-11 (×6): 6 via TOPICAL
  Filled 2021-07-05: qty 6

## 2021-07-05 MED ORDER — MIDAZOLAM HCL 2 MG/2ML IJ SOLN
2.0000 mg | Freq: Once | INTRAMUSCULAR | Status: AC
  Administered 2021-07-05: 2 mg via INTRAVENOUS

## 2021-07-05 MED ORDER — KETOROLAC TROMETHAMINE 30 MG/ML IJ SOLN
30.0000 mg | Freq: Once | INTRAMUSCULAR | Status: AC
  Administered 2021-07-05: 30 mg via INTRAVENOUS
  Filled 2021-07-05: qty 1

## 2021-07-05 MED ORDER — ETOMIDATE 2 MG/ML IV SOLN: 30.0000 mg | Freq: Once | INTRAVENOUS | Status: AC

## 2021-07-05 MED ORDER — DEXAMETHASONE SODIUM PHOSPHATE 10 MG/ML IJ SOLN
10.0000 mg | Freq: Once | INTRAMUSCULAR | Status: AC
  Administered 2021-07-05: 10 mg via INTRAVENOUS
  Filled 2021-07-05: qty 1

## 2021-07-05 MED ORDER — POLYETHYLENE GLYCOL 3350 17 G PO PACK
17.0000 g | PACK | Freq: Every day | ORAL | Status: DC
  Administered 2021-07-06 – 2021-07-08 (×3): 17 g
  Filled 2021-07-05 (×3): qty 1

## 2021-07-05 MED ORDER — NOREPINEPHRINE 4 MG/250ML-% IV SOLN
0.0000 ug/min | INTRAVENOUS | Status: DC
  Administered 2021-07-05: 15 ug/min via INTRAVENOUS
  Administered 2021-07-06: 4 ug/min via INTRAVENOUS
  Filled 2021-07-05 (×2): qty 250

## 2021-07-05 MED ORDER — ACETAMINOPHEN 500 MG PO TABS
1000.0000 mg | ORAL_TABLET | Freq: Once | ORAL | Status: DC
  Filled 2021-07-05: qty 2

## 2021-07-05 MED ORDER — SUCCINYLCHOLINE CHLORIDE 200 MG/10ML IV SOSY: 120.0000 mg | PREFILLED_SYRINGE | Freq: Once | INTRAVENOUS | Status: AC

## 2021-07-05 MED ORDER — PROPOFOL 1000 MG/100ML IV EMUL
5.0000 ug/kg/min | INTRAVENOUS | Status: DC
  Administered 2021-07-05: 5 ug/kg/min via INTRAVENOUS
  Administered 2021-07-06 (×2): 40 ug/kg/min via INTRAVENOUS
  Administered 2021-07-06: 30 ug/kg/min via INTRAVENOUS
  Administered 2021-07-06: 50 ug/kg/min via INTRAVENOUS
  Administered 2021-07-06: 30 ug/kg/min via INTRAVENOUS
  Administered 2021-07-07 (×3): 50 ug/kg/min via INTRAVENOUS
  Administered 2021-07-07: 35 ug/kg/min via INTRAVENOUS
  Administered 2021-07-07 (×2): 50 ug/kg/min via INTRAVENOUS
  Administered 2021-07-08: 30 ug/kg/min via INTRAVENOUS
  Administered 2021-07-08: 35 ug/kg/min via INTRAVENOUS
  Filled 2021-07-05 (×14): qty 100

## 2021-07-05 MED ORDER — SODIUM CHLORIDE 0.9 % IV SOLN
100.0000 mg | Freq: Every day | INTRAVENOUS | Status: DC
  Filled 2021-07-05: qty 20

## 2021-07-05 MED ORDER — DOCUSATE SODIUM 100 MG PO CAPS: 100.0000 mg | ORAL_CAPSULE | Freq: Two times a day (BID) | ORAL | Status: DC | PRN

## 2021-07-05 MED ORDER — DOCUSATE SODIUM 50 MG/5ML PO LIQD
100.0000 mg | Freq: Two times a day (BID) | ORAL | Status: DC
  Administered 2021-07-06 – 2021-07-08 (×6): 100 mg
  Filled 2021-07-05 (×6): qty 10

## 2021-07-05 MED ORDER — HEPARIN SODIUM (PORCINE) 5000 UNIT/ML IJ SOLN
5000.0000 [IU] | Freq: Three times a day (TID) | INTRAMUSCULAR | Status: DC
  Administered 2021-07-06: 5000 [IU] via SUBCUTANEOUS
  Filled 2021-07-05: qty 1

## 2021-07-05 MED ORDER — ETOMIDATE 2 MG/ML IV SOLN
INTRAVENOUS | Status: AC
  Administered 2021-07-05: 30 mg via INTRAVENOUS
  Filled 2021-07-05: qty 20

## 2021-07-05 NOTE — Progress Notes (Signed)
Emotional and spiritual care provided to wife (of 13 years), [place]. She reports "Ronalee Belts" has struggled for two years with various health issues. He is followed by [facility] yet a diagnosis  undetermined. She is afraid because of his Covid positive test yesterday and poor lungs. She is supported locally by her sisters, children and grandchildren. Protestant faith has been part of their faith journey. They both find prayer and encouragement helpful while do not identify as religious. Chaplain provided listening precious and explored with wife conversations they have had regarding wishes. She is able to navigate conversations while tearful. She desires all be done to help Ronalee Belts including exploration of getting him to his team of doctors at [facility]. She understands the need for him to be stable before a transfer and recognizes he is in poor condition at this time. Her wishes are to be as close to him at all times.   ? ? ? 07/05/21 2200  ?Clinical Encounter Type  ?Visited With Family  ?Visit Type Initial  ?Referral From Physician  ?Spiritual Encounters  ?Spiritual Needs Emotional;Prayer  ? ? ?

## 2021-07-05 NOTE — Consult Note (Signed)
Remdesivir - Pharmacy Brief Note ?  ?O:  ?ALT: 14 ?CXR: no active cardiopulmonary disease ?SpO2: 98% on BiPaP ?  ?A/P:  ?Remdesivir 200 mg IVPB once followed by 100 mg IVPB daily x 4 days.  ? ?Lorna Dibble, PharmD, BCCP ?Clinical Pharmacist ?07/05/2021 10:07 PM ? ? ?

## 2021-07-05 NOTE — ED Notes (Signed)
BP 66/47. Propofol placed on hold and Dr. Kerman Passey notified. 1L LR bolus started. ?

## 2021-07-05 NOTE — ED Notes (Signed)
Blood cultures and lactic sent to lab.  ?

## 2021-07-05 NOTE — ED Provider Notes (Signed)
? ?[facility] ?Provider Note ? ? ? Event Date/Time  ? First MD Initiated Contact with Patient 07/05/21 2128   ?  (approximate) ? ?History  ? ?Chief Complaint: Respiratory Distress ? ?HPI ? ?Henry Owens is a 61 y.o. male with a past medical history of anxiety presents to the emergency department for difficulty breathing.  EMS states upon their arrival they found the patient nearly apneic minimally responsive to sternal rub largely.  They began ventilating the patient and placed him on CPAP.  They state in route to the hospital patient began awakening but upon arrival had become somnolent once again.  Upon moving the patient to our bed patient is arousable, will briefly answer questions before falling back asleep.  Patient is able to answer most questions accurately.  Patient placed on BiPAP.  Patient found to be febrile to 103, tachypneic, hypoxic and tachycardic around 100 bpm.  Per wife patient tested positive for COVID at home 2 days ago. ? ?Physical Exam  ? ?Triage Vital Signs: ?ED Triage Vitals  ?Enc Vitals Group  ?   BP 07/05/21 2110 118/80  ?   Pulse Rate 07/05/21 2110 99  ?   Resp 07/05/21 2110 (!) 22  ?   Temp 07/05/21 2110 (!) 103 ?F (39.4 ?C)  ?   Temp Source 07/05/21 2110 Axillary  ?   SpO2 07/05/21 2110 (!) 82 %  ?   Weight 07/05/21 2124 180 lb 8.9 oz (81.9 kg)  ?   Height --   ?   Head Circumference --   ?   Peak Flow --   ?   Pain Score --   ?   Pain Loc --   ?   Pain Edu? --   ?   Excl. in [place]? --   ? ? ?Most recent vital signs: ?Vitals:  ? 07/05/21 2120 07/05/21 2124  ?BP:  105/73  ?Pulse:  (!) 102  ?Resp:  (!) 21  ?Temp:    ?SpO2: (!) 82% 98%  ? ? ?General: Somnolent but will awaken to voice or mild physical stimuli.  Answers questions appropriate with then falls back asleep. ?CV:  Good peripheral perfusion.  Regular rate and rhythm around 100 bpm ?Resp:  Respiratory rate around 20 breaths/min.  Respirations appear to be labored.  Clear bilaterally. ?Abd:  No  distention.  Soft, nontender.  No rebound or guarding. ?Other:  Patient is somnolent but awakens to voice. ? ? ?ED Results / Procedures / Treatments  ? ?EKG ? ?EKG viewed and interpreted by myself shows a sinus rhythm at 99 bpm with a narrow QRS, normal axis, normal intervals, nonspecific but no concerning ST changes. ? ?RADIOLOGY ? ?I personally viewed the chest x-ray images appears to have mild hazy opacities bilaterally. ?Radiology is read the chest x-ray is negative. ? ? ?MEDICATIONS ORDERED IN ED: ?Medications  ?acetaminophen (TYLENOL) tablet 1,000 mg (has no administration in time range)  ?propofol (DIPRIVAN) 1000 MG/100ML infusion (5 mcg/kg/min ? 81.9 kg Intravenous New Bag/Given 07/05/21 2156)  ?dexamethasone (DECADRON) injection 10 mg (10 mg Intravenous Given 07/05/21 2134)  ?ketorolac (TORADOL) 30 MG/ML injection 30 mg (30 mg Intravenous Given 07/05/21 2134)  ?etomidate (AMIDATE) injection 30 mg (30 mg Intravenous Given 07/05/21 2148)  ?succinylcholine (ANECTINE) syringe 120 mg (120 mg Intravenous Given 07/05/21 2148)  ? ? ? ?IMPRESSION / MDM / ASSESSMENT AND PLAN / ED COURSE  ?I reviewed the triage vital signs and the nursing notes. ? ?Patient presents emergency  department for worsening shortness of breath after testing positive for COVID 2 days ago per wife.  Patient is febrile he is tachycardic he is hypoxic to 82% on CPAP at 100% oxygen.  Placed on BiPAP patient O2 sat began increasing into the 90s however patient is becoming more somnolent and now has become largely unresponsive and less deep physical stimuli.  Decision made by myself to intubate.  Patient intubated with succinylcholine and etomidate without issue. ? ?Patient's chest x-ray overall is clear.  Given the patient's significant hypoxia we will obtain CTA imaging of the chest to rule out pulmonary embolism as well as a CT scan of the head to rule out intracranial abnormality.  We will dose Decadron given the patient's hypoxia as well as IV  remdesivir.  We will place the patient on propofol for sedation.  We will continue to closely monitor while awaiting results.  Patient ultimately will require ICU admission. ? ?Lab work thus far reassuring.  Chemistry shows slight renal insufficiency with creatinine 1.29.  CBC is normal including a normal white blood cell count.  Troponin 31.  BNP 91.4.  Patient's blood pressure briefly dropped after giving propofol and a push of 2 mg of Versed as the patient was awakening.  We will bolus 1 L of lactated Ringer's and continue to closely monitor. ? ?I spoke to the intensivist who will be admitting the patient to their service. ? ?INTUBATION ?Performed by: Harvest Dark ? ?Required items: required blood products, implants, devices, and special equipment available ?Patient identity confirmed: provided demographic data and hospital-assigned identification number ?Time out: Immediately prior to procedure a "time out" was called to verify the correct patient, procedure, equipment, support staff and site/side marked as required. ? ?Indications: Respiratory failure ? ?Intubation method: S4 Glidescope Laryngoscopy  ? ?Preoxygenation: 100% BVM ? ?Sedatives: 30 mg etomidate ?Paralytic: 120 mg succinylcholine ? ?Tube Size: 8.0 cuffed ? ?Post-procedure assessment: chest rise and ETCO2 monitor ?Breath sounds: equal and absent over the epigastrium ?Tube secured with: ETT holder ?Chest x-ray interpreted by radiologist and me. ? ?Chest x-ray findings: endotracheal tube in appropriate position ? ?Patient tolerated the procedure well with no immediate complications. ? ?CRITICAL CARE ?Performed by: Harvest Dark ? ? ?Total critical care time: 30 minutes ? ?Critical care time was exclusive of separately billable procedures and treating other patients. ? ?Critical care was necessary to treat or prevent imminent or life-threatening deterioration. ? ?Critical care was time spent personally by me on the following activities:  development of treatment plan with patient and/or surrogate as well as nursing, discussions with consultants, evaluation of patient's response to treatment, examination of patient, obtaining history from patient or surrogate, ordering and performing treatments and interventions, ordering and review of laboratory studies, ordering and review of radiographic studies, pulse oximetry and re-evaluation of patient's condition. ? ? ?FINAL CLINICAL IMPRESSION(S) / ED DIAGNOSES  ? ?COVID-19 ?Hypoxia ?Respiratory failure ? ?s document was prepared using Dragon voice recognition software and may include unintentional dictation errors. ?  Harvest Dark, MD ?07/05/21 2232 ? ?

## 2021-07-05 NOTE — ED Triage Notes (Signed)
Arrived via EMS. Tested COVID positive two days ago. 70% on RA on EMS arrival. Placed on NRB, oxygen saturation increased to 85% then placed on CPAP, saturation increased to 94%. Patient very drowsy on arrival to ED with oxygen saturation 82% on EMS CPAP.  ?

## 2021-07-05 NOTE — ED Notes (Signed)
ET Tube 25 at lip. OG 65 at lip. ?

## 2021-07-05 NOTE — H&P (Signed)
? ? ?NAME:  Henry Owens, MRN:  [id number], DOB:  1960/07/20, LOS: 0 ?ADMISSION DATE:  07/05/2021, CONSULTATION DATE:  3/14 ?REFERRING MD:  Harvest Dark MD CHIEF COMPLAINT: SOB  ? ?HPI  ?61 y.o  male with significant PMH of MGUS, current smoker 1.5 PPD x 25 years, chronic low bak pain and anxiety who presented to the ED with chief complaints of SOB. ? ?Patient is currently intubated and sedated, histroy obtained from patient's chart and wife who is currently at the bedside. Per patient's wife, patient developed fevers and chills over the weekend with associated generalized malaise and decreased po intake. They both took a home COVID test 2 days ago which came back positive. Per patient's wife, patient was started on Paxlovid treatment by her PCP but symptoms got worse with new onset SOB prompting wife to call EMS. On EMS arrival, patient was found tobe hypoxic with sats in the 70s on RA therefore was placed on NRB with slight improvement to 85%. He was subsequently placed on CPAP and transported to the ED. ? ?ED Course: In the emergency department, the temperature was 103 (39.4?C), the heart rate  102 beats/minute, the blood pressure 105/73 mm Hg, the respiratory rate 21 breaths/minute, and the oxygen saturation 98% on BiPAP. He was lethargic, moaned intermittently, and responded with one-word answers; symmetric movement in the arms and legs was observed. Patient become more somnolent and less responsive to deep physical stimuli requiring intubation for airway protection. Initial chest xray showed no active disease . Follow up CTA chest showed no PE but positive for pneumonia and mucous plugging in the right lower lobe. Shortly after intubation, patient's bp dropped following initiation of sedation requiring peripheral Levophwed and IVFs bolus. PCCM consulted for admission and further management. ? ?Pertinent Labs /Diagnostics Findings: ?Na+/ K+: 135/4.6 ?Glucose: 128 ?BUN/Cr.:11/1.29 ?  ?WBC/ TMAX: 7.0/  afebrile ?PCT: 0.73 ?Lactic acid: pending ?COVID PCR: Negative ?  ?Troponin: 31>41 ?BNP: 91 ? ?Past Medical History  ?Chronic low back pain ?Tobacco use ?MGUS ?Anxiety ? ?Significant Hospital Events   ?3/14: Admitted to ICU with acute hypoxic hypercapnic respiratory failure due to pneumonia ? ?Consults:  ?PCCM ? ?Procedures:  ?3/14: Intubation ? ?Significant Diagnostic Tests:  ?3/14: Chest Xray>Patchy bibasilar airspace disease consistent with history of COVID 19 pneumonia ?3/14: Noncontrast CT head>No acute intracranial abnormality ?3/14: CTA Chest>1. No CT evidence of central pulmonary artery embolus. ?2. Scattered interstitial nodularity most consistent with pneumonia, ?likely atypical. Clinical correlation is recommended. ?3. Mucous plugging of the right lower lobe bronchi with ?postobstructive atelectasis or infiltrate. Aspiration is not ?excluded. ?4. Mildly enlarged right hilar lymph nodes, likely reactive. ?5. A 4 mm right upper lobe nodule. Attention on follow-up imaging ?recommended. ? ?Micro Data:  ?3/14: SARS-CoV-2 PCR> negative ?3/14: Influenza PCR> negative ?3/14: Blood culture x2> ?3/14: Urine Culture> ?3/14: MRSA PCR>>  ?3/14: Strep pneumo urinary antigen> ?3/14: Legionella urinary antigen> ? ?Antimicrobials:  ?Ceftriaxone ? ?OBJECTIVE  ?Blood pressure 109/72, pulse 63, temperature (!) 100.8 ?F (38.2 ?C), resp. rate (!) 22, weight 81.9 kg, SpO2 100 %. ?   ?   ? ?Intake/Output Summary (Last 24 hours) at 07/05/2021 2309 ?Last data filed at 07/05/2021 2250 ?Gross per 24 hour  ?Intake --  ?Output 900 ml  ?Net -900 ml  ? ?Filed Weights  ? 07/05/21 2124  ?Weight: 81.9 kg  ? ?Physical Examination  ?GENERAL: 61 year-old critically ill patient lying in the bed intubated and sedated ?EYES: Pupils equal, round, reactive to light and  accommodation. No scleral icterus. Extraocular muscles intact.  ?HEENT: Head atraumatic, normocephalic. Oropharynx and nasopharynx clear.  ?NECK:  Supple, no jugular venous  distention. No thyroid enlargement, no tenderness.  ?LUNGS: Decreased breath sounds bilaterally, no wheezing, rales,rhonchi or crepitation. No use of accessory muscles of respiration.  ?CARDIOVASCULAR: S1, S2 normal. No murmurs, rubs, or gallops.  ?ABDOMEN: Soft, nontender, nondistended. Bowel sounds present. No organomegaly or mass.  ?EXTREMITIES: No pedal edema, cyanosis, or clubbing.  ?NEUROLOGIC: Cranial nerves II through XII are intact.  Muscle strength defered. Sensation intact. Gait not checked.  ?PSYCHIATRIC: The patient is intubated and sedated ?SKIN: No obvious rash, lesion, or ulcer.  ? ?Labs/imaging that I havepersonally reviewed  ?(right click and "Reselect all SmartList Selections" daily)  ? ?  ?Labs   ?CBC: ?Recent Labs  ?Lab 07/05/21 ?2133  ?WBC 7.0  ?HGB 14.0  ?HCT 43.4  ?MCV 96.0  ?PLT 174  ? ? ?Basic Metabolic Panel: ?Recent Labs  ?Lab 07/05/21 ?2133  ?NA 135  ?K 4.6  ?CL 102  ?CO2 30  ?GLUCOSE 128*  ?BUN 11  ?CREATININE 1.29*  ?CALCIUM 8.8*  ? ?GFR: ?CrCl cannot be calculated (Unknown ideal weight.). ?Recent Labs  ?Lab 07/05/21 ?2133  ?WBC 7.0  ? ? ?Liver Function Tests: ?Recent Labs  ?Lab 07/05/21 ?2133  ?AST 14*  ?ALT 14  ?ALKPHOS 179*  ?BILITOT 0.7  ?PROT 7.8  ?ALBUMIN 4.0  ? ?No results for input(s): LIPASE, AMYLASE in the last 168 hours. ?No results for input(s): AMMONIA in the last 168 hours. ? ?ABG ?No results found for: PHART, PCO2ART, PO2ART, HCO3, TCO2, ACIDBASEDEF, O2SAT  ? ?Coagulation Profile: ?No results for input(s): INR, PROTIME in the last 168 hours. ? ?Cardiac Enzymes: ?No results for input(s): CKTOTAL, CKMB, CKMBINDEX, TROPONINI in the last 168 hours. ? ?HbA1C: ?No results found for: HGBA1C ? ?CBG: ?No results for input(s): GLUCAP in the last 168 hours. ? ?Review of Systems:   ?UNABLE TO OBTAIN PATIENT IS INTUBATED AND SEDATED ? ?Past Medical History  ?He,  has a past medical history of Anxiety, Arthritis, and Flu (07/2017).  ? ?Surgical History   ? ?Past Surgical History:   ?Procedure Laterality Date  ? APPENDECTOMY  1975  ? BACK SURGERY    ? KNEE ARTHROSCOPY WITH MEDIAL MENISECTOMY Right 08/20/2017  ? Procedure: KNEE ARTHROSCOPY WITH PARTIAL MEDIAL AND LATERAL MENISECTOMY,PARTIAL SYNOVECTOMY,CHONDROPLASTY, LOOSE BODY REMOVAL;  Surgeon: Lovell Sheehan, MD;  Location: [facility] ORS;  Service: Orthopedics;  Laterality: Right;  ? [place]  ? 1996, 2000  ?  ? ?Social History  ? reports that he has been smoking cigarettes. He has a 37.50 pack-year smoking history. He has never used smokeless tobacco. He reports that he does not currently use drugs. He reports that he does not drink alcohol.  ? ?Family History   ?His family history is negative for Bladder Cancer, Prostate cancer, and Hematuria.  ? ?Allergies ?Allergies  ?Allergen Reactions  ? Azithromycin Rash  ? Penicillin G Rash  ?  Has patient had a PCN reaction causing immediate rash, facial/tongue/throat swelling, SOB or lightheadedness with hypotension: Yes ?Has patient had a PCN reaction causing severe rash involving mucus membranes or skin necrosis: No ?Has patient had a PCN reaction that required hospitalization: No ?Has patient had a PCN reaction occurring within the last 10 years: No ?If all of the above answers are "NO", then may proceed with Cephalosporin use. ?  ?  ? ?Home Medications  ?Prior to Admission medications   ?  Medication Sig Start Date End Date Taking? Authorizing Provider  ?ALPRAZolam (XANAX) 1 MG tablet Take 1 mg by mouth 3 (three) times daily as needed for anxiety.    Yes [provider]  ?Cyanocobalamin (VITAMIN B 12 PO) Take 1 tablet by mouth daily.   Yes [provider]  ?docusate sodium (COLACE) 50 MG capsule Take 50 mg by mouth 2 (two) times daily.   Yes [provider]  ?fluticasone (FLONASE) 50 MCG/ACT nasal spray Place 1 spray into both nostrils daily as needed for allergies or rhinitis.   Yes [provider]  ?gabapentin (NEURONTIN) 100 MG capsule Take 2 capsules  by mouth 3 (three) times daily. 06/10/21  Yes [provider]  ?lisinopril (ZESTRIL) 10 MG tablet Take 10 mg by mouth daily.   Yes [provider]  ?methocarbamol (ROBAXIN) 500 MG tablet Take 500 m

## 2021-07-05 NOTE — ED Notes (Signed)
ET TUBE ADVANCED 2 CM BY RT AS INSTRUCTED BY DR. [place]. ?

## 2021-07-06 ENCOUNTER — Inpatient Hospital Stay: Payer: Medicaid Other

## 2021-07-06 DIAGNOSIS — U071 COVID-19: Secondary | ICD-10-CM | POA: Diagnosis not present

## 2021-07-06 DIAGNOSIS — J9600 Acute respiratory failure, unspecified whether with hypoxia or hypercapnia: Secondary | ICD-10-CM | POA: Diagnosis not present

## 2021-07-06 LAB — URINE DRUG SCREEN, QUALITATIVE (ARMC ONLY)
Amphetamines, Ur Screen: NOT DETECTED
Barbiturates, Ur Screen: NOT DETECTED
Benzodiazepine, Ur Scrn: POSITIVE — AB
Cannabinoid 50 Ng, Ur ~~LOC~~: POSITIVE — AB
Cocaine Metabolite,Ur ~~LOC~~: NOT DETECTED
MDMA (Ecstasy)Ur Screen: NOT DETECTED
Methadone Scn, Ur: NOT DETECTED
Opiate, Ur Screen: POSITIVE — AB
Phencyclidine (PCP) Ur S: NOT DETECTED
Tricyclic, Ur Screen: POSITIVE — AB

## 2021-07-06 LAB — GLUCOSE, CAPILLARY
Glucose-Capillary: 139 mg/dL — ABNORMAL HIGH (ref 70–99)
Glucose-Capillary: 147 mg/dL — ABNORMAL HIGH (ref 70–99)
Glucose-Capillary: 162 mg/dL — ABNORMAL HIGH (ref 70–99)
Glucose-Capillary: 167 mg/dL — ABNORMAL HIGH (ref 70–99)
Glucose-Capillary: 173 mg/dL — ABNORMAL HIGH (ref 70–99)
Glucose-Capillary: 193 mg/dL — ABNORMAL HIGH (ref 70–99)
Glucose-Capillary: 194 mg/dL — ABNORMAL HIGH (ref 70–99)
Glucose-Capillary: 201 mg/dL — ABNORMAL HIGH (ref 70–99)

## 2021-07-06 LAB — CBC WITH DIFFERENTIAL/PLATELET
Abs Immature Granulocytes: 0.05 10*3/uL (ref 0.00–0.07)
Basophils Absolute: 0 10*3/uL (ref 0.0–0.1)
Basophils Relative: 0 %
Eosinophils Absolute: 0 10*3/uL (ref 0.0–0.5)
Eosinophils Relative: 0 %
HCT: 41.7 % (ref 39.0–52.0)
Hemoglobin: 13.3 g/dL (ref 13.0–17.0)
Immature Granulocytes: 0 %
Lymphocytes Relative: 5 %
Lymphs Abs: 0.7 10*3/uL (ref 0.7–4.0)
MCH: 31.1 pg (ref 26.0–34.0)
MCHC: 31.9 g/dL (ref 30.0–36.0)
MCV: 97.4 fL (ref 80.0–100.0)
Monocytes Absolute: 0.6 10*3/uL (ref 0.1–1.0)
Monocytes Relative: 4 %
Neutro Abs: 12.3 10*3/uL — ABNORMAL HIGH (ref 1.7–7.7)
Neutrophils Relative %: 91 %
Platelets: 216 10*3/uL (ref 150–400)
RBC: 4.28 MIL/uL (ref 4.22–5.81)
RDW: 13.6 % (ref 11.5–15.5)
WBC: 13.6 10*3/uL — ABNORMAL HIGH (ref 4.0–10.5)
nRBC: 0 % (ref 0.0–0.2)

## 2021-07-06 LAB — BLOOD GAS, ARTERIAL
Acid-base deficit: 3.1 mmol/L — ABNORMAL HIGH (ref 0.0–2.0)
Acid-base deficit: 0.2 mmol/L (ref 0.0–2.0)
Bicarbonate: 25.3 mmol/L (ref 20.0–28.0)
Bicarbonate: 36.4 mmol/L — ABNORMAL HIGH (ref 20.0–28.0)
FIO2: 50 %
FIO2: 0.6 %
MECHVT: 500 mL
Mechanical Rate: 18
O2 Saturation: 99.9 %
O2 Saturation: 99.2 %
PEEP: 5 cmH2O
PEEP: 5 cmH2O
Patient temperature: 37
Patient temperature: 37
RATE: 18 resp/min
RATE: 18 resp/min
Spontaneous VT: 500 mL
pCO2 arterial: 59 mmHg — ABNORMAL HIGH (ref 32–48)
pCO2 arterial: 50 mmHg — ABNORMAL HIGH (ref 32–48)
pH, Arterial: 7.24 — ABNORMAL LOW (ref 7.35–7.45)
pH, Arterial: 7.33 — ABNORMAL LOW (ref 7.35–7.45)
pO2, Arterial: 117 mmHg — ABNORMAL HIGH (ref 83–108)
pO2, Arterial: 85 mmHg (ref 83–108)

## 2021-07-06 LAB — COMPREHENSIVE METABOLIC PANEL
ALT: 12 U/L (ref 0–44)
AST: 12 U/L — ABNORMAL LOW (ref 15–41)
Albumin: 3.6 g/dL (ref 3.5–5.0)
Alkaline Phosphatase: 159 U/L — ABNORMAL HIGH (ref 38–126)
Anion gap: 7 (ref 5–15)
BUN: 14 mg/dL (ref 6–20)
CO2: 26 mmol/L (ref 22–32)
Calcium: 9 mg/dL (ref 8.9–10.3)
Chloride: 106 mmol/L (ref 98–111)
Creatinine, Ser: 1.48 mg/dL — ABNORMAL HIGH (ref 0.61–1.24)
GFR, Estimated: 54 mL/min — ABNORMAL LOW (ref >60)
Glucose, Bld: 208 mg/dL — ABNORMAL HIGH (ref 70–99)
Potassium: 5.5 mmol/L — ABNORMAL HIGH (ref 3.5–5.1)
Sodium: 139 mmol/L (ref 135–145)
Total Bilirubin: 0.6 mg/dL (ref 0.3–1.2)
Total Protein: 7.1 g/dL (ref 6.5–8.1)

## 2021-07-06 LAB — FERRITIN
Ferritin: 309 ng/mL (ref 24–336)
Ferritin: 283 ng/mL (ref 24–336)

## 2021-07-06 LAB — URINALYSIS, COMPLETE (UACMP) WITH MICROSCOPIC
Bilirubin Urine: NEGATIVE
Glucose, UA: NEGATIVE mg/dL
Ketones, ur: NEGATIVE mg/dL
Leukocytes,Ua: NEGATIVE
Nitrite: NEGATIVE
Protein, ur: NEGATIVE mg/dL
Specific Gravity, Urine: >1.046 — ABNORMAL HIGH (ref 1.005–1.030)
pH: 5 (ref 5.0–8.0)

## 2021-07-06 LAB — LACTIC ACID, PLASMA
Lactic Acid, Venous: 1.1 mmol/L (ref 0.5–1.9)
Lactic Acid, Venous: 2.2 mmol/L (ref 0.5–1.9)

## 2021-07-06 LAB — C-REACTIVE PROTEIN
CRP: 16.5 mg/dL — ABNORMAL HIGH (ref <1.0)
CRP: 18.8 mg/dL — ABNORMAL HIGH (ref <1.0)

## 2021-07-06 LAB — D-DIMER, QUANTITATIVE
D-Dimer, Quant: 0.53 ug/mL-FEU — ABNORMAL HIGH (ref 0.00–0.50)
D-Dimer, Quant: 0.36 ug/mL-FEU (ref 0.00–0.50)

## 2021-07-06 LAB — STREP PNEUMONIAE URINARY ANTIGEN: Strep Pneumo Urinary Antigen: NEGATIVE

## 2021-07-06 LAB — FIBRINOGEN: Fibrinogen: 475 mg/dL (ref 210–475)

## 2021-07-06 LAB — MRSA NEXT GEN BY PCR, NASAL: MRSA by PCR Next Gen: NOT DETECTED

## 2021-07-06 LAB — RESP PANEL BY RT-PCR (FLU A&B, COVID) ARPGX2
Influenza A by PCR: NEGATIVE
Influenza B by PCR: NEGATIVE
SARS Coronavirus 2 by RT PCR: NEGATIVE

## 2021-07-06 LAB — MAGNESIUM: Magnesium: 2.3 mg/dL (ref 1.7–2.4)

## 2021-07-06 LAB — POTASSIUM: Potassium: 4 mmol/L (ref 3.5–5.1)

## 2021-07-06 LAB — TSH: TSH: 1.481 u[IU]/mL (ref 0.350–4.500)

## 2021-07-06 LAB — CORTISOL-AM, BLOOD: Cortisol - AM: 4.6 ug/dL — ABNORMAL LOW (ref 6.7–22.6)

## 2021-07-06 LAB — HIV ANTIBODY (ROUTINE TESTING W REFLEX): HIV Screen 4th Generation wRfx: NONREACTIVE

## 2021-07-06 LAB — TROPONIN I (HIGH SENSITIVITY)
Troponin I (High Sensitivity): 41 ng/L — ABNORMAL HIGH (ref <18)
Troponin I (High Sensitivity): 29 ng/L — ABNORMAL HIGH (ref <18)
Troponin I (High Sensitivity): 35 ng/L — ABNORMAL HIGH (ref <18)

## 2021-07-06 LAB — T4, FREE: Free T4: 0.68 ng/dL (ref 0.61–1.12)

## 2021-07-06 LAB — PROCALCITONIN: Procalcitonin: 0.73 ng/mL

## 2021-07-06 LAB — PHOSPHORUS: Phosphorus: 2.7 mg/dL (ref 2.5–4.6)

## 2021-07-06 MED ORDER — BUDESONIDE 0.25 MG/2ML IN SUSP
0.2500 mg | Freq: Two times a day (BID) | RESPIRATORY_TRACT | Status: DC
  Administered 2021-07-06: 0.25 mg via RESPIRATORY_TRACT
  Filled 2021-07-06: qty 2

## 2021-07-06 MED ORDER — IPRATROPIUM-ALBUTEROL 0.5-2.5 (3) MG/3ML IN SOLN
3.0000 mL | Freq: Four times a day (QID) | RESPIRATORY_TRACT | Status: DC
  Administered 2021-07-07 (×2): 3 mL via RESPIRATORY_TRACT
  Filled 2021-07-06 (×2): qty 3

## 2021-07-06 MED ORDER — METHYLPREDNISOLONE SODIUM SUCC 40 MG IJ SOLR
40.0000 mg | Freq: Every day | INTRAMUSCULAR | Status: DC
  Administered 2021-07-06 – 2021-07-09 (×4): 40 mg via INTRAVENOUS
  Filled 2021-07-06 (×4): qty 1

## 2021-07-06 MED ORDER — FREE WATER
30.0000 mL | Status: DC
Start: 2021-07-06 — End: 2021-07-08
  Administered 2021-07-06 – 2021-07-08 (×12): 30 mL

## 2021-07-06 MED ORDER — SODIUM CHLORIDE 0.9 % IV SOLN
500.0000 mg | Freq: Every day | INTRAVENOUS | Status: AC
  Administered 2021-07-06 – 2021-07-08 (×3): 500 mg via INTRAVENOUS
  Filled 2021-07-06: qty 500
  Filled 2021-07-06 (×2): qty 5

## 2021-07-06 MED ORDER — SODIUM CHLORIDE 0.9 % IV SOLN: 500.0000 mg | INTRAVENOUS | Status: DC

## 2021-07-06 MED ORDER — VITAL 1.5 CAL PO LIQD
1000.0000 mL | ORAL | Status: DC
Start: 2021-07-06 — End: 2021-07-06

## 2021-07-06 MED ORDER — HEPARIN SODIUM (PORCINE) 5000 UNIT/ML IJ SOLN: 5000.0000 [IU] | Freq: Three times a day (TID) | INTRAMUSCULAR | Status: DC

## 2021-07-06 MED ORDER — IPRATROPIUM-ALBUTEROL 0.5-2.5 (3) MG/3ML IN SOLN
3.0000 mL | Freq: Four times a day (QID) | RESPIRATORY_TRACT | Status: DC | PRN
Start: 2021-07-06 — End: 2021-07-11

## 2021-07-06 MED ORDER — PROSOURCE TF PO LIQD
45.0000 mL | Freq: Three times a day (TID) | ORAL | Status: DC
  Filled 2021-07-06: qty 45

## 2021-07-06 MED ORDER — INSULIN ASPART 100 UNIT/ML IV SOLN
10.0000 [IU] | Freq: Once | INTRAVENOUS | Status: AC
Start: 2021-07-06 — End: 2021-07-06
  Administered 2021-07-06: 10 [IU] via INTRAVENOUS
  Filled 2021-07-06: qty 0.1

## 2021-07-06 MED ORDER — CHLORHEXIDINE GLUCONATE 0.12% ORAL RINSE (MEDLINE KIT)
15.0000 mL | Freq: Two times a day (BID) | OROMUCOSAL | Status: DC
  Administered 2021-07-06 – 2021-07-08 (×6): 15 mL via OROMUCOSAL

## 2021-07-06 MED ORDER — BUDESONIDE 0.5 MG/2ML IN SUSP
0.5000 mg | Freq: Two times a day (BID) | RESPIRATORY_TRACT | Status: DC
  Administered 2021-07-06 – 2021-07-08 (×4): 0.5 mg via RESPIRATORY_TRACT
  Filled 2021-07-06 (×4): qty 2

## 2021-07-06 MED ORDER — SODIUM CHLORIDE 0.9 % IV SOLN
2.0000 g | INTRAVENOUS | Status: DC
  Administered 2021-07-06 – 2021-07-10 (×5): 2 g via INTRAVENOUS
  Filled 2021-07-06: qty 20
  Filled 2021-07-06: qty 2
  Filled 2021-07-06 (×2): qty 20
  Filled 2021-07-06: qty 2
  Filled 2021-07-06: qty 20
  Filled 2021-07-06: qty 2

## 2021-07-06 MED ORDER — SODIUM BICARBONATE 8.4 % IV SOLN
50.0000 meq | Freq: Once | INTRAVENOUS | Status: AC
  Administered 2021-07-06: 50 meq via INTRAVENOUS
  Filled 2021-07-06: qty 50

## 2021-07-06 MED ORDER — SODIUM CHLORIDE 0.9 % IV SOLN
2.0000 g | Freq: Three times a day (TID) | INTRAVENOUS | Status: DC
  Administered 2021-07-06: 2 g via INTRAVENOUS
  Filled 2021-07-06 (×2): qty 2

## 2021-07-06 MED ORDER — ORAL CARE MOUTH RINSE
15.0000 mL | OROMUCOSAL | Status: DC
  Administered 2021-07-06 – 2021-07-09 (×31): 15 mL via OROMUCOSAL

## 2021-07-06 MED ORDER — IPRATROPIUM-ALBUTEROL 0.5-2.5 (3) MG/3ML IN SOLN
3.0000 mL | RESPIRATORY_TRACT | Status: DC
  Administered 2021-07-06 (×5): 3 mL via RESPIRATORY_TRACT
  Filled 2021-07-06 (×5): qty 3

## 2021-07-06 MED ORDER — PROSOURCE TF PO LIQD
45.0000 mL | Freq: Four times a day (QID) | ORAL | Status: DC
  Administered 2021-07-06 – 2021-07-07 (×4): 45 mL
  Filled 2021-07-06 (×5): qty 45

## 2021-07-06 MED ORDER — DEXTROSE 50 % IV SOLN
12.5000 g | Freq: Once | INTRAVENOUS | Status: AC
  Administered 2021-07-06: 12.5 g via INTRAVENOUS
  Filled 2021-07-06: qty 50

## 2021-07-06 MED ORDER — FENTANYL CITRATE PF 50 MCG/ML IJ SOSY
50.0000 ug | PREFILLED_SYRINGE | INTRAMUSCULAR | Status: DC | PRN
  Administered 2021-07-06 (×2): 100 ug via INTRAVENOUS
  Administered 2021-07-06 – 2021-07-07 (×2): 50 ug via INTRAVENOUS
  Administered 2021-07-07: 100 ug via INTRAVENOUS
  Administered 2021-07-07: 50 ug via INTRAVENOUS
  Administered 2021-07-07: 100 ug via INTRAVENOUS
  Filled 2021-07-06 (×3): qty 2
  Filled 2021-07-06: qty 1
  Filled 2021-07-06: qty 2
  Filled 2021-07-06 (×2): qty 1

## 2021-07-06 MED ORDER — ENOXAPARIN SODIUM 40 MG/0.4ML IJ SOSY
40.0000 mg | PREFILLED_SYRINGE | INTRAMUSCULAR | Status: DC
  Administered 2021-07-06 – 2021-07-10 (×5): 40 mg via SUBCUTANEOUS
  Filled 2021-07-06 (×5): qty 0.4

## 2021-07-06 MED ORDER — SERTRALINE HCL 50 MG PO TABS
150.0000 mg | ORAL_TABLET | Freq: Every day | ORAL | Status: DC
  Administered 2021-07-07 – 2021-07-08 (×2): 150 mg
  Filled 2021-07-06 (×2): qty 3

## 2021-07-06 MED ORDER — VITAL 1.5 CAL PO LIQD
1000.0000 mL | ORAL | Status: DC
Start: 2021-07-06 — End: 2021-07-07
  Administered 2021-07-06: 1000 mL

## 2021-07-06 NOTE — Progress Notes (Signed)
Pt transported on vent from ED to CT, then ICU without incident ?

## 2021-07-06 NOTE — Consult Note (Signed)
PHARMACY CONSULT NOTE ? ?Pharmacy Consult for Electrolyte Monitoring and Replacement  ? ?Recent Labs: ?Potassium (mmol/L)  ?Date Value  ?07/06/2021 5.5 (H)  ? ?Magnesium (mg/dL)  ?Date Value  ?07/06/2021 2.3  ? ?Calcium (mg/dL)  ?Date Value  ?07/06/2021 9.0  ? ?Albumin (g/dL)  ?Date Value  ?07/06/2021 3.6  ? ?Phosphorus (mg/dL)  ?Date Value  ?07/06/2021 2.7  ? ?Sodium (mmol/L)  ?Date Value  ?07/06/2021 139  ? ?Assessment: ?Patient is a 61 y/o M with medical history including chronic low back pain, tobacco use, MGUS, anxiety who is admitted with acute respiratory failure secondary to pneumonia. Patient was intubated 3/14. He remains intubated, sedated, and on mechanical ventilation in the ICU. Pharmacy consulted to assist with electrolyte monitoring and replacement as indicated. ? ?Goal of Therapy:  ?Electrolytes within normal limits ? ?Plan:  ?--Mild hyperkalemia with K to 5.5. Insulin + dextrose therapy per PCCM >> re-check potassium = 4 ?--Pharmacy will continue to follow along ? ?Benita Gutter ?07/06/2021 7:32 AM  ?

## 2021-07-06 NOTE — Progress Notes (Signed)
? ?NAME:  Henry Owens, MRN:  [id number], DOB:  03/09/1961, LOS: 1 ?ADMISSION DATE:  07/05/2021 ? ?HPI/SYNOPSIS-ADMITTED FOR COVID 19 INFECTION AND PNEUMONIA WITH COPD EXACERBATION LEADING TO SEVERE HYPOXIC RESP FAILURE WITH SEPSIS POA REQUIRING MECHANICAL VENTILATION  ?61 y.o  male with significant PMH of MGUS, current smoker 1.5 PPD x 25 years, chronic low bak pain and anxiety who presented to the ED with chief complaints of SOB. ?  ?Patient is currently intubated and sedated, histroy obtained from patient's chart and wife who is currently at the bedside. Per patient's wife, patient developed fevers and chills over the weekend with associated generalized malaise and decreased po intake. They both took a home COVID test 2 days ago which came back positive. Per patient's wife, patient was started on Paxlovid treatment by her PCP but symptoms got worse with new onset SOB prompting wife to call EMS. On EMS arrival, patient was found tobe hypoxic with sats in the 70s on RA therefore was placed on NRB with slight improvement to 85%. He was subsequently placed on CPAP and transported to the ED. ?  ?ED Course: In the emergency department, the temperature was 103 (39.4?C), the heart rate  102 beats/minute, the blood pressure 105/73 mm Hg, the respiratory rate 21 breaths/minute, and the oxygen saturation 98% on BiPAP. He was lethargic, moaned intermittently, and responded with one-word answers; symmetric movement in the arms and legs was observed. Patient become more somnolent and less responsive to deep physical stimuli requiring intubation for airway protection. Initial chest xray showed no active disease . Follow up CTA chest showed no PE but positive for pneumonia and mucous plugging in the right lower lobe. Shortly after intubation, patient's bp dropped following initiation of sedation requiring peripheral Levophwed and IVFs bolus. PCCM consulted for admission and further management. ?  ?Past Medical History   ?Chronic low back pain ?Tobacco use ?MGUS ?Anxiety ?  ?Significant Hospital Events   ?3/14: Admitted to ICU with acute hypoxic hypercapnic respiratory failure due to pneumonia ?3/15 remains on vent, on pressors ?  ?Consults:  ?PCCM ?  ?Procedures:  ?3/14: Intubation ?  ?Significant Diagnostic Tests:  ?3/14: Chest Xray>Patchy bibasilar airspace disease consistent with history of COVID 19 pneumonia ?3/14: Noncontrast CT head>No acute intracranial abnormality ?3/14: CTA Chest>1. No CT evidence of central pulmonary artery embolus. ?2. Scattered interstitial nodularity most consistent with pneumonia, ?likely atypical. Clinical correlation is recommended. ?3. Mucous plugging of the right lower lobe bronchi with ?postobstructive atelectasis or infiltrate. Aspiration is not ?excluded. ?4. Mildly enlarged right hilar lymph nodes, likely reactive. ?5. A 4 mm right upper lobe nodule. Attention on follow-up imaging ?recommended. ?  ?Micro Data:  ?3/14: SARS-CoV-2 PCR> negative ?3/14: Influenza PCR> negative ?3/14: Blood culture x2> ?3/14: Urine Culture> ?3/14: MRSA PCR>>  ?3/14: Strep pneumo urinary antigen> ?3/14: Legionella urinary antigen> ?  ? ?Antimicrobials:  ? ?Antibiotics Given (last 72 hours)   ? ? Date/Time Action Medication Dose Rate  ? 07/06/21 0026 New Bag/Given  ? remdesivir 200 mg in sodium chloride 0.9% 250 mL IVPB 200 mg 580 mL/hr  ? 07/06/21 0240 New Bag/Given  ? cefTRIAXone (ROCEPHIN) 2 g in sodium chloride 0.9 % 100 mL IVPB 2 g 200 mL/hr  ? 07/06/21 0319 New Bag/Given  ? aztreonam (AZACTAM) 2 g in sodium chloride 0.9 % 100 mL IVPB 2 g 200 mL/hr  ? ?  ? ? ? ? ? ? ? ?Interim History / Subjective:  ?Remains critically ill ?Remains intubated/sedated ?On pressors ?  Will repeat COVID test ? ? ? ? ? ?Objective   ?Blood pressure 99/69, pulse 78, temperature 98.2 ?F (36.8 ?C), resp. rate 14, height 5' 10" (1.778 m), weight 80.2 kg, SpO2 99 %. ?   ?Vent Mode: PRVC ?FiO2 (%):  [50 %-100 %] 50 % ?Set Rate:  [18 bmp] 18  bmp ?Vt Set:  [500 mL] 500 mL ?PEEP:  [5 cmH20] 5 cmH20  ? ?Intake/Output Summary (Last 24 hours) at 07/06/2021 0731 ?Last data filed at 07/06/2021 0400 ?Gross per 24 hour  ?Intake 508.99 ml  ?Output 1130 ml  ?Net -621.01 ml  ? ?Filed Weights  ? 07/05/21 2124 07/06/21 0030 07/06/21 0200  ?Weight: 81.9 kg 80.2 kg 80.2 kg  ? ? ? ? ?REVIEW OF SYSTEMS ? ?PATIENT IS UNABLE TO PROVIDE COMPLETE REVIEW OF SYSTEMS DUE TO SEVERE CRITICAL ILLNESS AND TOXIC METABOLIC ENCEPHALOPATHY ? ?ALL OTHER ROS ARE NEGATIVE ? ? ?PHYSICAL EXAMINATION: ? ?GENERAL:critically ill appearing, +resp distress ?EYES: Pupils equal, round, reactive to light.  No scleral icterus.  ?MOUTH: Moist mucosal membrane. INTUBATED ?NECK: Supple.  ?PULMONARY: +rhonchi, +wheezing ?CARDIOVASCULAR: S1 and S2.  No murmurs  ?GASTROINTESTINAL: Soft, nontender, -distended. Positive bowel sounds.  ?MUSCULOSKELETAL: No swelling, clubbing, or edema.  ?NEUROLOGIC: obtunded ?SKIN:intact,warm,dry ? ? ? ?Labs/imaging that I havepersonally reviewed  ?(right click and "Reselect all SmartList Selections" daily)  ? ? ? ? ? ?ASSESSMENT AND PLAN ?SYNOPSIS ? ?61 yo white male with acute COVID 19 infection(test +at home) with severe pneumonia leading to severe hypoxic respiratory failure with likely underlying COPD with COPD exacerbation ? ?Severe ACUTE Hypoxic and Hypercapnic Respiratory Failure ?-continue Mechanical Ventilator support ?-continue Bronchodilator Therapy ?-Wean Fio2 and PEEP as tolerated ?-VAP/VENT bundle implementation ?-will NOT perform SAT/SBT when respiratory parameters are met ? ?Vent Mode: PRVC ?FiO2 (%):  [50 %-100 %] 50 % ?Set Rate:  [18 bmp] 18 bmp ?Vt Set:  [500 mL] 500 mL ?PEEP:  [5 cmH20] 5 cmH20 ? ? ?SEVERE COPD EXACERBATION ?-continue IV steroids as prescribed ?-continue NEB THERAPY as prescribed ?-morphine as needed ?-wean fio2 as needed and tolerated ? ?CARDIAC ?ICU monitoring ? ? ?ACUTE KIDNEY INJURY/Renal Failure ?-continue Foley Catheter-assess  need ?-Avoid nephrotoxic agents ?-Follow urine output, BMP ?-Ensure adequate renal perfusion, optimize oxygenation ?-Renal dose medications ? ? ?Intake/Output Summary (Last 24 hours) at 07/06/2021 0731 ?Last data filed at 07/06/2021 0400 ?Gross per 24 hour  ?Intake 508.99 ml  ?Output 1130 ml  ?Net -621.01 ml  ? ?BMP Latest Ref Rng & Units 07/06/2021 07/05/2021 06/10/2020  ?Glucose 70 - 99 mg/dL 208(H) 128(H) 109(H)  ?BUN 6 - 20 mg/dL _0 ?Creatinine 0.61 - 1.24 mg/dL 1.48(H) 1.29(H) 1.04  ?Sodium 135 - 145 mmol/L 139 135 136  ?Potassium 3.5 - 5.1 mmol/L 5.5(H) 4.6 3.9  ?Chloride 98 - 111 mmol/L 106 102 105  ?CO2 22 - 32 mmol/L 26 30 21(L)  ?Calcium 8.9 - 10.3 mg/dL 9.0 8.8(L) 9.3  ? ? ? ? ?NEUROLOGY ?Acute toxic metabolic encephalopathy due to hypoxia, need for sedation ?Goal RASS -2 to -3 ? ? ?SEPTIC SHOCK ?SOURCE-pneumonia ?-use vasopressors to keep MAP>65 as needed ?-follow ABG and LA ?-follow up cultures ?-emperic ABX ?-consider stress dose steroids ?-aggressive IV fluid resuscitation ? ?INFECTIOUS DISEASE ?-continue antibiotics as prescribed ?-follow up cultures ? ?ENDO ?- ICU hypoglycemic\Hyperglycemia protocol ?-check FSBS per protocol ? ?GI ?GI PROPHYLAXIS as indicated ? ?NUTRITIONAL STATUS ?DIET-->will startTF's as tolerated ?Constipation protocol as indicated ? ? ?ELECTROLYTES ?-follow labs as needed ?-replace  as needed ?-pharmacy consultation and following ? ? ? ? ?Best practice (right click and "Reselect all SmartList Selections" daily)  ?Diet:  NPO ?Pain/Anxiety/Delirium protocol (if indicated): Yes (RASS goal -2) ?VAP protocol (if indicated): Yes ?DVT prophylaxis: Subcutaneous Heparin ?GI prophylaxis: PPI ?Glucose control:  SSI Yes ?Central venous access:  N/A ?Arterial line:  N/A ?Foley:  Yes, and it is still needed ?Mobility:  bed rest  ?Code Status:  FULL CODE ?Disposition: ICU ? ?Labs   ?CBC: ?Recent Labs  ?Lab 07/05/21 ?2133 07/06/21 ?0334  ?WBC 7.0 13.6*  ?NEUTROABS  --  12.3*  ?HGB 14.0 13.3   ?HCT 43.4 41.7  ?MCV 96.0 97.4  ?PLT 174 216  ? ? ?Basic Metabolic Panel: ?Recent Labs  ?Lab 07/05/21 ?2133 07/06/21 ?0334  ?NA 135 139  ?K 4.6 5.5*  ?CL 102 106  ?CO2 30 26  ?GLUCOSE 128* 208*  ?BUN 11 14  ?CREA

## 2021-07-06 NOTE — Progress Notes (Addendum)
RT unable to transmit ABG results into Epic. ?Results are as following: ?pH   7.33 ?pCO2  50 ?pO2   85 ?HCO3  26.4 ?

## 2021-07-06 NOTE — Progress Notes (Signed)
Patient alert to voice during wake up assessment and able to follow commands. Patient currently -2 RAAS, alert to pain, NSR, with clear/diminished lungs sounds. Patient started on tube feeds. PRN medications given per orders.  ?

## 2021-07-06 NOTE — Progress Notes (Addendum)
Initial Nutrition Assessment ? ?DOCUMENTATION CODES:  ? ?Not applicable ? ?INTERVENTION:  ? ?Vital 1.5@ 66m/hr- Initiate at 266mhr and increase by 1041mr q 8 hours until goal rate is reached.  ? ?Propofol: 14.74 ml/hr- provides 389kcal/day  ? ?Pro-Source 52m58mD via tube, provides 40kcal and 11g of protein per serving  ? ?Free water flushes 30ml19mhours to maintain tube patency  ? ?Regimen provides 2349kcal/day, 125g/day protein and 1097ml/55mof free water.  ? ?Pt at high refeed risk; recommend monitor potassium, magnesium and phosphorus labs daily until stable ? ?NUTRITION DIAGNOSIS:  ? ?Inadequate oral intake related to inability to eat (pt sedated and ventilated) as evidenced by NPO status. ? ?GOAL:  ? ?Provide needs based on ASPEN/SCCM guidelines ? ?MONITOR:  ? ?Vent status, Labs, Weight trends, TF tolerance, Skin, I & O's ? ?REASON FOR ASSESSMENT:  ? ?Ventilator ?  ? ?ASSESSMENT:  ? ?60 y/o50ale with h/o anixety, MGUS, lumbar stenosis and substance abuse who is admitted with COPD exacerbation, CAP, sepsis and AKI. ? ?Pt sedated and ventilated. OGT in place. Plan is to start tube feeds today. Pt's wife at bedside reports pt with decreased appetite and oral intake for several days pta; wife reports that pt has not eaten anything since Sunday. Per chart, pt is weight stable at baseline. Wife reports pt's UBW is around 175lbs.  ? ?Medications reviewed and include: colace, insulin, solu-medrol, protonix, miralax, azithromycin, ceftriaxone, levophed, propofol  ? ?Labs reviewed: K 4.0 wnl, creat 1.48(H), P 2.7 wnl, Mg 2.3 wnl ?Wbc- 13.6(H) ?Cbgs- 167, 193, 194, 201 x 24 hrs ? ?Patient is currently intubated on ventilator support ?MV: 9.2 L/min ?Temp (24hrs), Avg:99.1 ?F (37.3 ?C), Min:94.5 ?F (34.7 ?C), Max:103 ?F (39.4 ?C) ? ?Propofol: 14.74 ml/hr- provides 389kcal/day  ? ?MAP- >65mmHg25m?UOP- 1230ml  ?34mTRITION - FOCUSED PHYSICAL EXAM: ? ?Flowsheet Row Most Recent Value  ?Orbital Region No depletion  ?Upper  Arm Region Moderate depletion  ?Thoracic and Lumbar Region No depletion  ?Buccal Region No depletion  ?Temple Region No depletion  ?Clavicle Bone Region No depletion  ?Clavicle and Acromion Bone Region No depletion  ?Scapular Bone Region No depletion  ?Dorsal Hand No depletion  ?Patellar Region Mild depletion  ?Anterior Thigh Region Mild depletion  ?Posterior Calf Region Mild depletion  ?Edema (RD Assessment) None  ?Hair Reviewed  ?Eyes Reviewed  ?Mouth Reviewed  ?Skin Reviewed  ?Nails Reviewed  ? ?Diet Order:   ?Diet Order   ? ? None  ? ?  ? ?EDUCATION NEEDS:  ? ?No education needs have been identified at this time ? ?Skin:  Skin Assessment: Reviewed RN Assessment ? ?Last BM:  pta ? ?Height:  ? ?Ht Readings from Last 1 Encounters:  ?07/06/21 '5\' 10"'$  (1.778 m)  ? ? ?Weight:  ? ?Wt Readings from Last 1 Encounters:  ?07/06/21 80.2 kg  ? ? ?Ideal Body Weight:  75.45 kg ? ?BMI:  Body mass index is 25.37 kg/m?. ? ?Estimated Nutritional Needs:  ? ?Kcal:  2215kcal/day ? ?Protein:  120-135g/day ? ?Fluid:  2.3-2.6L/day ? ?Yasmeen Manka CaKoleen Distance LDN ?Please refer to [facility]ION for RD and/or RD on-call/weekend/after hours pager ? ?

## 2021-07-06 NOTE — Progress Notes (Signed)
eLink Physician-Brief Progress Note ?Patient Name: Henry Owens ?DOB: 1960-07-25 ?MRN: [id number] ? ? ?Date of Service ? 07/06/2021  ?HPI/Events of Note ? Patient admitted with recent positive Covid, acute hypoxemic respiratory failure, bilateral interstitial pneumonia on CT chest, and altered mental status, he was intubated in the ED for airway protection and hypoxemia.  ?eICU Interventions ? New Patient Evaluation.  ? ? ? ?  ? ?Frederik Pear ?07/06/2021, 12:39 AM ?

## 2021-07-06 NOTE — Progress Notes (Signed)
GOALS OF CARE DISCUSSION ? ?The Clinical status was relayed to family in Henry Owens and daughter at bedside ? ?Updated and notified of patients medical condition- ?Patient remains unresponsive and will not open eyes to command.   ?Patient is having a weak cough and struggling to remove secretions.   ?Patient with increased WOB and using accessory muscles to breathe ?Explained to family course of therapy and the modalities  ? ?Pneumonia with COPD ? ? ?Patient with Progressive multiorgan failure with a very high probablity of a very minimal chance of meaningful recovery despite all aggressive and optimal medical therapy.  ?PATIENT REMAINS FULL CODE ? ?Family understands the situation. ? ? ?Family are satisfied with Plan of action and management. All questions answered ? ?Additional CC time 25 mins ? ? ?Corrin Parker, M.D.  ?[facility]  ?Medical Director [place] ?Medical Director [facility] Cardio-Pulmonary Department  ? ? ?

## 2021-07-07 ENCOUNTER — Inpatient Hospital Stay: Payer: Medicaid Other

## 2021-07-07 DIAGNOSIS — R0902 Hypoxemia: Secondary | ICD-10-CM | POA: Diagnosis not present

## 2021-07-07 DIAGNOSIS — U071 COVID-19: Secondary | ICD-10-CM | POA: Diagnosis not present

## 2021-07-07 DIAGNOSIS — J9600 Acute respiratory failure, unspecified whether with hypoxia or hypercapnia: Secondary | ICD-10-CM | POA: Diagnosis not present

## 2021-07-07 LAB — GLUCOSE, CAPILLARY
Glucose-Capillary: 167 mg/dL — ABNORMAL HIGH (ref 70–99)
Glucose-Capillary: 160 mg/dL — ABNORMAL HIGH (ref 70–99)
Glucose-Capillary: 155 mg/dL — ABNORMAL HIGH (ref 70–99)
Glucose-Capillary: 195 mg/dL — ABNORMAL HIGH (ref 70–99)
Glucose-Capillary: 138 mg/dL — ABNORMAL HIGH (ref 70–99)
Glucose-Capillary: 141 mg/dL — ABNORMAL HIGH (ref 70–99)

## 2021-07-07 LAB — COMPREHENSIVE METABOLIC PANEL
ALT: 14 U/L (ref 0–44)
AST: 32 U/L (ref 15–41)
Albumin: 3.3 g/dL — ABNORMAL LOW (ref 3.5–5.0)
Alkaline Phosphatase: 127 U/L — ABNORMAL HIGH (ref 38–126)
Anion gap: 12 (ref 5–15)
BUN: 19 mg/dL (ref 6–20)
CO2: 25 mmol/L (ref 22–32)
Calcium: 8.8 mg/dL — ABNORMAL LOW (ref 8.9–10.3)
Chloride: 102 mmol/L (ref 98–111)
Creatinine, Ser: 1.2 mg/dL (ref 0.61–1.24)
GFR, Estimated: >60 mL/min (ref >60)
Glucose, Bld: 171 mg/dL — ABNORMAL HIGH (ref 70–99)
Potassium: 4.1 mmol/L (ref 3.5–5.1)
Sodium: 139 mmol/L (ref 135–145)
Total Bilirubin: 0.3 mg/dL (ref 0.3–1.2)
Total Protein: 6.8 g/dL (ref 6.5–8.1)

## 2021-07-07 LAB — CBC WITH DIFFERENTIAL/PLATELET
Abs Immature Granulocytes: 0.06 10*3/uL (ref 0.00–0.07)
Basophils Absolute: 0 10*3/uL (ref 0.0–0.1)
Basophils Relative: 0 %
Eosinophils Absolute: 0 10*3/uL (ref 0.0–0.5)
Eosinophils Relative: 0 %
HCT: 38 % — ABNORMAL LOW (ref 39.0–52.0)
Hemoglobin: 12 g/dL — ABNORMAL LOW (ref 13.0–17.0)
Immature Granulocytes: 1 %
Lymphocytes Relative: 6 %
Lymphs Abs: 0.6 10*3/uL — ABNORMAL LOW (ref 0.7–4.0)
MCH: 31.1 pg (ref 26.0–34.0)
MCHC: 31.6 g/dL (ref 30.0–36.0)
MCV: 98.4 fL (ref 80.0–100.0)
Monocytes Absolute: 0.5 10*3/uL (ref 0.1–1.0)
Monocytes Relative: 5 %
Neutro Abs: 8.8 10*3/uL — ABNORMAL HIGH (ref 1.7–7.7)
Neutrophils Relative %: 88 %
Platelets: 146 10*3/uL — ABNORMAL LOW (ref 150–400)
RBC: 3.86 MIL/uL — ABNORMAL LOW (ref 4.22–5.81)
RDW: 13.9 % (ref 11.5–15.5)
WBC: 9.9 10*3/uL (ref 4.0–10.5)
nRBC: 0 % (ref 0.0–0.2)

## 2021-07-07 LAB — C-REACTIVE PROTEIN: CRP: 16.8 mg/dL — ABNORMAL HIGH (ref <1.0)

## 2021-07-07 LAB — LEGIONELLA PNEUMOPHILA SEROGP 1 UR AG: L. pneumophila Serogp 1 Ur Ag: NEGATIVE

## 2021-07-07 LAB — HEMOGLOBIN A1C
Hgb A1c MFr Bld: 5.1 % (ref 4.8–5.6)
Mean Plasma Glucose: 100 mg/dL

## 2021-07-07 LAB — FERRITIN: Ferritin: 349 ng/mL — ABNORMAL HIGH (ref 24–336)

## 2021-07-07 LAB — D-DIMER, QUANTITATIVE: D-Dimer, Quant: 0.47 ug/mL-FEU (ref 0.00–0.50)

## 2021-07-07 LAB — PHOSPHORUS: Phosphorus: 2.9 mg/dL (ref 2.5–4.6)

## 2021-07-07 LAB — MAGNESIUM: Magnesium: 2.3 mg/dL (ref 1.7–2.4)

## 2021-07-07 MED ORDER — VECURONIUM BROMIDE 10 MG IV SOLR
10.0000 mg | INTRAVENOUS | Status: AC
  Administered 2021-07-07: 10 mg via INTRAVENOUS

## 2021-07-07 MED ORDER — MIDAZOLAM HCL 2 MG/2ML IJ SOLN
4.0000 mg | Freq: Once | INTRAMUSCULAR | Status: AC
  Administered 2021-07-07: 4 mg via INTRAVENOUS

## 2021-07-07 MED ORDER — MIDAZOLAM HCL 2 MG/2ML IJ SOLN: 2.0000 mg | INTRAMUSCULAR | Status: AC

## 2021-07-07 MED ORDER — VECURONIUM BROMIDE 10 MG IV SOLR
10.0000 mg | INTRAVENOUS | Status: DC | PRN
  Administered 2021-07-07: 10 mg via INTRAVENOUS
  Filled 2021-07-07: qty 10

## 2021-07-07 MED ORDER — MIDAZOLAM HCL 2 MG/2ML IJ SOLN
2.0000 mg | INTRAMUSCULAR | Status: DC | PRN
  Filled 2021-07-07: qty 2

## 2021-07-07 MED ORDER — VITAL HIGH PROTEIN PO LIQD
1000.0000 mL | ORAL | Status: DC
  Administered 2021-07-07 – 2021-07-08 (×2): 1000 mL

## 2021-07-07 MED ORDER — FENTANYL BOLUS VIA INFUSION
50.0000 ug | INTRAVENOUS | Status: DC | PRN
  Administered 2021-07-07 – 2021-07-08 (×3): 50 ug via INTRAVENOUS
  Filled 2021-07-07: qty 100

## 2021-07-07 MED ORDER — ALBUTEROL SULFATE (2.5 MG/3ML) 0.083% IN NEBU
2.5000 mg | INHALATION_SOLUTION | Freq: Four times a day (QID) | RESPIRATORY_TRACT | Status: DC
  Administered 2021-07-07 – 2021-07-09 (×8): 2.5 mg via RESPIRATORY_TRACT
  Filled 2021-07-07 (×9): qty 3

## 2021-07-07 MED ORDER — MIDAZOLAM HCL 2 MG/2ML IJ SOLN
INTRAMUSCULAR | Status: AC
  Administered 2021-07-07: 2 mg via INTRAVENOUS
  Filled 2021-07-07: qty 2

## 2021-07-07 MED ORDER — FENTANYL 2500MCG IN NS 250ML (10MCG/ML) PREMIX INFUSION
50.0000 ug/h | INTRAVENOUS | Status: DC
  Administered 2021-07-07: 50 ug/h via INTRAVENOUS
  Administered 2021-07-08: 125 ug/h via INTRAVENOUS
  Filled 2021-07-07 (×2): qty 250

## 2021-07-07 NOTE — Progress Notes (Signed)
? ? ?NAME:  Henry Owens, MRN:  [id number], DOB:  March 24, 1961, LOS: 2 ?ADMISSION DATE:  07/05/2021, CONSULTATION DATE:  3/14 ?REFERRING MD:  Harvest Dark MD CHIEF COMPLAINT: SOB  ? ?Brief Pt Description / Synopsis:  ?61 y.o. Male admitted with Severe Sepsis and Acute Hypoxic/Hypercapnic Respiratory Failure secondary to Aspiration/Atypical Pneumonia and RLL Mucus plugging requiring intubation and mechanical ventilation. ? ?HPI  ?61 y.o  male with significant PMH of MGUS, current smoker 1.5 PPD x 25 years, chronic low bak pain and anxiety who presented to the ED with chief complaints of SOB. ? ?Patient is currently intubated and sedated, histroy obtained from patient's chart and wife who is currently at the bedside. Per patient's wife, patient developed fevers and chills over the weekend with associated generalized malaise and decreased po intake. They both took a home COVID test 2 days ago which came back positive. Per patient's wife, patient was started on Paxlovid treatment by her PCP but symptoms got worse with new onset SOB prompting wife to call EMS. On EMS arrival, patient was found tobe hypoxic with sats in the 70s on RA therefore was placed on NRB with slight improvement to 85%. He was subsequently placed on CPAP and transported to the ED. ? ?ED Course: In the emergency department, the temperature was 103 (39.4?C), the heart rate  102 beats/minute, the blood pressure 105/73 mm Hg, the respiratory rate 21 breaths/minute, and the oxygen saturation 98% on BiPAP. He was lethargic, moaned intermittently, and responded with one-word answers; symmetric movement in the arms and legs was observed. Patient become more somnolent and less responsive to deep physical stimuli requiring intubation for airway protection. Initial chest xray showed no active disease . Follow up CTA chest showed no PE but positive for pneumonia and mucous plugging in the right lower lobe. Shortly after intubation, patient's bp  dropped following initiation of sedation requiring peripheral Levophwed and IVFs bolus. PCCM consulted for admission and further management. ? ?Pertinent Labs /Diagnostics Findings: ?Na+/ K+: 135/4.6 ?Glucose: 128 ?BUN/Cr.:11/1.29 ?  ?WBC/ TMAX: 7.0/ afebrile ?PCT: 0.73 ?Lactic acid: pending ?COVID PCR: Negative ?  ?Troponin: 31>41 ?BNP: 91 ? ?Past Medical History  ?Chronic low back pain ?Tobacco use ?MGUS ?Anxiety ? ?Significant Hospital Events   ?3/14: Admitted to ICU with acute hypoxic hypercapnic respiratory failure due to pneumonia ?3/16: Acute Hypoxic event due to suspected mucus plugging. No SBT today.  Plan for Bronch. ? ?Consults:  ?PCCM ? ?Procedures:  ?3/14: Intubation ?3/16: Bronchoscopy ? ?Significant Diagnostic Tests:  ?3/14: Chest Xray>Patchy bibasilar airspace disease consistent with history of COVID 19 pneumonia ?3/14: Noncontrast CT head>No acute intracranial abnormality ?3/14: CTA Chest>1. No CT evidence of central pulmonary artery embolus. ?2. Scattered interstitial nodularity most consistent with pneumonia, ?likely atypical. Clinical correlation is recommended. ?3. Mucous plugging of the right lower lobe bronchi with ?postobstructive atelectasis or infiltrate. Aspiration is not ?excluded. ?4. Mildly enlarged right hilar lymph nodes, likely reactive. ?5. A 4 mm right upper lobe nodule. Attention on follow-up imaging ?recommended. ? ?Micro Data:  ?3/14: SARS-CoV-2 PCR> negative ?3/14: Influenza PCR> negative ?3/14: Blood culture x2> no growth to date ?3/14: Urine Culture> ?3/14: MRSA PCR>> negative ?3/14: Strep pneumo urinary antigen>negative ?3/14: Legionella urinary antigen> ?3/16: Tracheal aspirate>> ?3/16: BAL>> ? ?Antimicrobials:  ?Remdesivir 3/14 x 1 dose ?Aztreonam 3/15 x 1 dose ?Azithromycin 3/15>> ?Ceftriaxone 3/15>> ? ?OBJECTIVE  ?Blood pressure (!) 149/83, pulse 88, temperature 98.6 ?F (37 ?C), resp. rate 17, height _0  (1.778 m), weight 80.2 kg, SpO2  99 %. ?   ?Vent Mode: PRVC ?FiO2  (%):  [28 %-50 %] 28 % ?Set Rate:  [18 bmp] 18 bmp ?Vt Set:  [500 mL] 500 mL ?PEEP:  [5 cmH20] 5 cmH20 ?Plateau Pressure:  [10 cmH20-14 cmH20] 14 cmH20  ? ?Intake/Output Summary (Last 24 hours) at 07/07/2021 0740 ?Last data filed at 07/07/2021 0400 ?Gross per 24 hour  ?Intake 895.78 ml  ?Output 1150 ml  ?Net -254.22 ml  ? ? ?Filed Weights  ? 07/06/21 0030 07/06/21 0200 07/07/21 0300  ?Weight: 80.2 kg 80.2 kg 80.2 kg  ? ?Physical Examination  ?GENERAL: 61 year-old critically ill patient lying in the bed intubated and sedated, with respiratory distress and vent dyschrony ?EYES: Pupils equal, round, reactive to light and accommodation. No scleral icterus. Extraocular muscles intact.  ?HEENT: Head atraumatic, normocephalic. Oropharynx and nasopharynx clear.  ?NECK:  Supple, no jugular venous distention. No thyroid enlargement, no tenderness.  ?LUNGS: coarse breath sounds bilaterally, no wheezing, rales,rhonchi or crepitation.  Dyssynchronous with the vent, increased work of breathing accessory muscle use ?CARDIOVASCULAR: Tachycardia, regular rhythm S1, S2. No murmurs, rubs, or gallops.  ?ABDOMEN: Soft, nontender, nondistended. Bowel sounds present. No organomegaly or mass.  ?EXTREMITIES: No pedal edema, cyanosis, or clubbing.  ?NEUROLOGIC: Sedated, delirious, unable to follow commands ?PSYCHIATRIC: The patient is intubated and sedated ?SKIN: No obvious rash, lesion, or ulcer.  ? ?Labs/imaging that I havepersonally reviewed  ?(right click and "Reselect all SmartList Selections" daily)  ? ?  ?Labs   ?CBC: ?Recent Labs  ?Lab 07/05/21 ?2133 07/06/21 ?0334 07/07/21 ?0344  ?WBC 7.0 13.6* 9.9  ?NEUTROABS  --  12.3* 8.8*  ?HGB 14.0 13.3 12.0*  ?HCT 43.4 41.7 38.0*  ?MCV 96.0 97.4 98.4  ?PLT 174 216 146*  ? ? ? ?Basic Metabolic Panel: ?Recent Labs  ?Lab 07/05/21 ?2133 07/06/21 ?0334 07/06/21 ?1049 07/07/21 ?0344  ?NA 135 139  --  139  ?K 4.6 5.5* 4.0 4.1  ?CL 102 106  --  102  ?CO2 30 26  --  25  ?GLUCOSE 128* 208*  --  171*  ?BUN  11 14  --  19  ?CREATININE 1.29* 1.48*  --  1.20  ?CALCIUM 8.8* 9.0  --  8.8*  ?MG  --  2.3  --  2.3  ?PHOS  --  2.7  --  2.9  ? ? ?GFR: ?Estimated Creatinine Clearance: 68.4 mL/min (by C-G formula based on SCr of 1.2 mg/dL). ?Recent Labs  ?Lab 07/05/21 ?2133 07/05/21 ?2358 07/06/21 ?6967 07/06/21 ?8938 07/07/21 ?0344  ?PROCALCITON  --  0.73  --   --   --   ?WBC 7.0  --  13.6*  --  9.9  ?LATICACIDVEN  --   --  1.1 2.2*  --   ? ? ? ?Liver Function Tests: ?Recent Labs  ?Lab 07/05/21 ?2133 07/06/21 ?0334 07/07/21 ?0344  ?AST 14* 12* 32  ?ALT _0 ?ALKPHOS 179* 159* 127*  ?BILITOT 0.7 0.6 0.3  ?PROT 7.8 7.1 6.8  ?ALBUMIN 4.0 3.6 3.3*  ? ? ?No results for input(s): LIPASE, AMYLASE in the last 168 hours. ?No results for input(s): AMMONIA in the last 168 hours. ? ?ABG ?   ?Component Value Date/Time  ? PHART 7.24 (L) 07/06/2021 0747  ? PCO2ART 59 (H) 07/06/2021 0747  ? PO2ART 117 (H) 07/06/2021 0747  ? HCO3 25.3 07/06/2021 0747  ? ACIDBASEDEF 3.1 (H) 07/06/2021 0747  ? O2SAT 99.9 07/06/2021 0747  ?  ? ?Coagulation Profile: ?No  results for input(s): INR, PROTIME in the last 168 hours. ? ?Cardiac Enzymes: ?No results for input(s): CKTOTAL, CKMB, CKMBINDEX, TROPONINI in the last 168 hours. ? ?HbA1C: ?Hgb A1c MFr Bld  ?Date/Time Value Ref Range Status  ?07/05/2021 11:58 PM 5.1 4.8 - 5.6 % Final  ?  Comment:  ?  (NOTE) ?        Prediabetes: 5.7 - 6.4 ?        Diabetes: >6.4 ?        Glycemic control for adults with diabetes: <7.0 ?  ? ? ?CBG: ?Recent Labs  ?Lab 07/06/21 ?1109 07/06/21 ?1554 07/06/21 ?1917 07/06/21 ?2310 07/07/21 ?0305  ?GLUCAP 167* 139* 147* 173* 167*  ? ? ?Review of Systems:   ?UNABLE TO OBTAIN PATIENT IS INTUBATED AND SEDATED ? ?Past Medical History  ?He,  has a past medical history of Anxiety, Arthritis, and Flu (07/2017).  ? ?Surgical History   ? ?Past Surgical History:  ?Procedure Laterality Date  ? APPENDECTOMY  1975  ? BACK SURGERY    ? KNEE ARTHROSCOPY WITH MEDIAL MENISECTOMY Right 08/20/2017  ?  Procedure: KNEE ARTHROSCOPY WITH PARTIAL MEDIAL AND LATERAL MENISECTOMY,PARTIAL SYNOVECTOMY,CHONDROPLASTY, LOOSE BODY REMOVAL;  Surgeon: Lovell Sheehan, MD;  Location: [facility] ORS;  Service: Orthopedics;  Later

## 2021-07-07 NOTE — Procedures (Signed)
?PROCEDURE: BRONCHOSCOPY ?Therapeutic Aspiration of Tracheobronchial Tree ? ?PROCEDURE DATE: 07/07/2021  TIME:  ?NAME:  Henry Owens  DOB:05-Dec-1960  MRN: [id number] ?LOC:  IC10A/IC10A-AA    HOSP DAY: '@LENGTHOFSTAYDAYS'$ @ ?CODE STATUS:   ? ?  ?Code Status Orders  ?(From admission, onward)  ?  ? ? ?  ? ?  Start     Ordered  ? 07/05/21 2300  Full code  Continuous       ? 07/05/21 2305  ? ?  ?  ? ?  ? ?Code Status History   ? ? Date Active Date Inactive Code Status Order ID Comments User Context  ? 08/20/2017 1214 08/20/2017 1709 Full Code [id number]  Lovell Sheehan, MD Inpatient  ? ?  ?   ? ? ? ?Indications/Preliminary Diagnosis: multifocal pneumonia ? ?Consent: (Place X beside choice/s below) ? The benefits, risks and possible complications of the procedure were        explained to: ? __ patient ? _x__ patient's family ? ___ other:___________  ?who verbalized understanding and gave: ? ___ verbal ? ___ written ? _x__ verbal and written ? ___ telephone ? ___ other:________ consent.  ?  ?  Unable to obtain consent; procedure performed on emergent basis.   ?  Other:   ? ? ?  ?PRESEDATION ASSESSMENT: History and Physical has been performed. Patient meds and allergies have been reviewed. Presedation airway examination has been performed and documented. Baseline vital signs, sedation score, oxygenation status, and cardiac rhythm were reviewed. Patient was deemed to be in satisfactory condition to undergo the procedure.  ? ? ?PROCEDURE DETAILS: Timeout performed and correct patient, name, & ID confirmed. Following prep per Pulmonary policy, appropriate sedation was administered. The Bronchoscope was inserted in to oral cavity with bite block in place. Therapeutic aspiration of Tracheobronchial tree was performed.  Airway exam proceeded with findings, technical procedures, and specimen collection as noted below. At the end of exam the scope was withdrawn without incident. Impression and Plan as noted below.  ? ? ? ? ?  Airway Prep (Place X beside choice below)  ? 1% Transtracheal Lidocaine Anesthetization 7 cc  ? Patient prepped per Bronchoscopy Lab Policy  ?   ? ? Insertion Route (Place X beside choice below)  ? Nasal  ? Oral  ?x Endotracheal Tube  ? Tracheostomy  ? ?PRE-PROCEDURE MEDICATIONS: ? Sedative/Narcotic Amt Dose  ? Versed 4 mg  ? Fentanyl 50 mcg  ?Diprivan  mg  ?VEC  10 mg  ? ? ?TECHNICAL PROCEDURES: (Place X beside choice below) ?  Procedures  Description  ?  None   ?  Electrocautery   ?  Cryotherapy   ?  Balloon Dilatation   ?  Bronchography   ?  Stent Placement   ?x  Therapeutic Aspiration Thick mucoid secretions  ?  Laser/Argon Plasma   ? Brachytherapy Catheter Placement   ? Foreign Body Removal    ? ? ? ? ? ?SPECIMENS (Sites): (Place X beside choice below) ? Specimens Description  ? No Specimens Obtained    ? Washings   ?x Lavage Thick mucoid secretions extracted RUL lavage  ? Biopsies   ? Fine Needle Aspirates   ? Brushings   ? Sputum   ? ?FINDINGS: Thick Mucoid secretions and mucus plugs  in all segments of the airways ?ESTIMATED BLOOD LOSS: none ?COMPLICATIONS/RESOLUTION: none ? ? ? ? ? ?IMPRESSION:POST-PROCEDURE DX:  ?Pneumonia ? ? ?RECOMMENDATION/PLAN:  ? ?Await cultures ?Continue IV abx ? ? ? ?  Corrin Parker, M.D.  ?[facility]  ?Medical Director [place] ?Medical Director [facility] Cardio-Pulmonary Department  ? ? ?

## 2021-07-07 NOTE — Consult Note (Signed)
PHARMACY CONSULT NOTE ? ?Pharmacy Consult for Electrolyte Monitoring and Replacement  ? ?Recent Labs: ?Potassium (mmol/L)  ?Date Value  ?07/07/2021 4.1  ? ?Magnesium (mg/dL)  ?Date Value  ?07/07/2021 2.3  ? ?Calcium (mg/dL)  ?Date Value  ?07/07/2021 8.8 (L)  ? ?Albumin (g/dL)  ?Date Value  ?07/07/2021 3.3 (L)  ? ?Phosphorus (mg/dL)  ?Date Value  ?07/07/2021 2.9  ? ?Sodium (mmol/L)  ?Date Value  ?07/07/2021 139  ? ?Assessment: ?Patient is a 61 y/o M with medical history including chronic low back pain, tobacco use, MGUS, anxiety who is admitted with acute respiratory failure secondary to pneumonia. Patient was intubated 3/14. He remains intubated, sedated, and on mechanical ventilation in the ICU. Pharmacy consulted to assist with electrolyte monitoring and replacement as indicated. ? ?Goal of Therapy:  ?Electrolytes within normal limits ? ?Plan:  ?--No electrolyte replacement indicated at this time ?--Pharmacy will continue to follow along ? ?Benita Gutter ?07/07/2021 7:22 AM  ?

## 2021-07-07 NOTE — Progress Notes (Signed)
Nutrition Brief Follow Up Note  ? ?Propofol change  ? ?INTERVENTION:  ? ?Change to Vital HP@ 23m/hr continuous  ? ?Propofol: 24.6 ml/hr- provides 649kcal/day  ? ?Free water flushes 341mq4 hours to maintain tube patency  ? ?Regimen provides 2209kcal/day, 136g/day protein and 148468may of free water.  ? ?Estimated Nutritional Needs:  ? ?Kcal:  2215kcal/day ? ?Protein:  120-135g/day ? ?Fluid:  2.3-2.6L/day ? ?CasKoleen Distance, RD, LDN ?Please refer to [facility] for RD and/or RD on-call/weekend/after hours pager ? ?

## 2021-07-07 NOTE — Plan of Care (Signed)
? ? ?  Interdisciplinary Goals of Care Family Meeting ? ? ?Date carried out: 07/07/2021 ? ?Location of the meeting: Unit ? ?Member's involved: Physician, Bedside Registered Nurse, and Family Member or next of kin ? ?Durable Power of Tour manager: Wife at Bedside   ? ? ?Code status: Full Code ? ?Disposition: Continue current acute care ? ? ? ?GOALS OF CARE DISCUSSION ? ?The Clinical status was relayed to family in detail- ? ?Updated and notified of patients medical condition- ?Patient remains unresponsive and will not open eyes to command.   ?Patient is having a weak cough and struggling to remove secretions.   ?Patient with increased WOB and using accessory muscles to breathe ?Explained to family course of therapy and the modalities  ? ?Plan for [place] ?Severe resp distress ?Excessive amounts of secretions ? ? ? ?Family are satisfied with Plan of action and management. All questions answered ? ?Additional CC time 35 mins ? ? ?Corrin Parker, M.D.  ?[facility]  ?Medical Director [place] ?Medical Director [facility] Cardio-Pulmonary Department  ? ? ?

## 2021-07-08 ENCOUNTER — Encounter: Payer: Self-pay | Admitting: Internal Medicine

## 2021-07-08 ENCOUNTER — Inpatient Hospital Stay: Payer: Medicaid Other

## 2021-07-08 ENCOUNTER — Other Ambulatory Visit: Payer: Self-pay

## 2021-07-08 DIAGNOSIS — J9600 Acute respiratory failure, unspecified whether with hypoxia or hypercapnia: Secondary | ICD-10-CM

## 2021-07-08 DIAGNOSIS — U071 COVID-19: Secondary | ICD-10-CM | POA: Diagnosis not present

## 2021-07-08 LAB — CBC WITH DIFFERENTIAL/PLATELET
Abs Immature Granulocytes: 0.04 10*3/uL (ref 0.00–0.07)
Basophils Absolute: 0 10*3/uL (ref 0.0–0.1)
Basophils Relative: 0 %
Eosinophils Absolute: 0 10*3/uL (ref 0.0–0.5)
Eosinophils Relative: 0 %
HCT: 35.3 % — ABNORMAL LOW (ref 39.0–52.0)
Hemoglobin: 11.6 g/dL — ABNORMAL LOW (ref 13.0–17.0)
Immature Granulocytes: 0 %
Lymphocytes Relative: 10 %
Lymphs Abs: 0.9 10*3/uL (ref 0.7–4.0)
MCH: 31.6 pg (ref 26.0–34.0)
MCHC: 32.9 g/dL (ref 30.0–36.0)
MCV: 96.2 fL (ref 80.0–100.0)
Monocytes Absolute: 0.6 10*3/uL (ref 0.1–1.0)
Monocytes Relative: 6 %
Neutro Abs: 8 10*3/uL — ABNORMAL HIGH (ref 1.7–7.7)
Neutrophils Relative %: 84 %
Platelets: 152 10*3/uL (ref 150–400)
RBC: 3.67 MIL/uL — ABNORMAL LOW (ref 4.22–5.81)
RDW: 14.3 % (ref 11.5–15.5)
WBC: 9.5 10*3/uL (ref 4.0–10.5)
nRBC: 0 % (ref 0.0–0.2)

## 2021-07-08 LAB — COMPREHENSIVE METABOLIC PANEL
ALT: 14 U/L (ref 0–44)
AST: 29 U/L (ref 15–41)
Albumin: 3.2 g/dL — ABNORMAL LOW (ref 3.5–5.0)
Alkaline Phosphatase: 100 U/L (ref 38–126)
Anion gap: 5 (ref 5–15)
BUN: 27 mg/dL — ABNORMAL HIGH (ref 6–20)
CO2: 29 mmol/L (ref 22–32)
Calcium: 8.5 mg/dL — ABNORMAL LOW (ref 8.9–10.3)
Chloride: 105 mmol/L (ref 98–111)
Creatinine, Ser: 1.05 mg/dL (ref 0.61–1.24)
GFR, Estimated: >60 mL/min (ref >60)
Glucose, Bld: 136 mg/dL — ABNORMAL HIGH (ref 70–99)
Potassium: 4.9 mmol/L (ref 3.5–5.1)
Sodium: 139 mmol/L (ref 135–145)
Total Bilirubin: 0.4 mg/dL (ref 0.3–1.2)
Total Protein: 6 g/dL — ABNORMAL LOW (ref 6.5–8.1)

## 2021-07-08 LAB — PHOSPHORUS: Phosphorus: 3.4 mg/dL (ref 2.5–4.6)

## 2021-07-08 LAB — GLUCOSE, CAPILLARY
Glucose-Capillary: 101 mg/dL — ABNORMAL HIGH (ref 70–99)
Glucose-Capillary: 102 mg/dL — ABNORMAL HIGH (ref 70–99)
Glucose-Capillary: 121 mg/dL — ABNORMAL HIGH (ref 70–99)
Glucose-Capillary: 122 mg/dL — ABNORMAL HIGH (ref 70–99)
Glucose-Capillary: 141 mg/dL — ABNORMAL HIGH (ref 70–99)
Glucose-Capillary: 181 mg/dL — ABNORMAL HIGH (ref 70–99)

## 2021-07-08 LAB — C-REACTIVE PROTEIN: CRP: 6.5 mg/dL — ABNORMAL HIGH (ref <1.0)

## 2021-07-08 LAB — D-DIMER, QUANTITATIVE: D-Dimer, Quant: 0.44 ug/mL-FEU (ref 0.00–0.50)

## 2021-07-08 LAB — FERRITIN: Ferritin: 269 ng/mL (ref 24–336)

## 2021-07-08 MED ORDER — DOCUSATE SODIUM 100 MG PO CAPS
100.0000 mg | ORAL_CAPSULE | Freq: Every evening | ORAL | Status: DC
  Administered 2021-07-08: 100 mg via ORAL
  Filled 2021-07-08 (×5): qty 1

## 2021-07-08 MED ORDER — LIDOCAINE VISCOUS HCL 2 % MT SOLN
15.0000 mL | Freq: Once | OROMUCOSAL | Status: AC
  Administered 2021-07-08: 15 mL via ORAL
  Filled 2021-07-08: qty 15

## 2021-07-08 MED ORDER — FLUTICASONE PROPIONATE 50 MCG/ACT NA SUSP
1.0000 | Freq: Every day | NASAL | Status: DC | PRN
  Filled 2021-07-08: qty 16

## 2021-07-08 MED ORDER — LISINOPRIL 10 MG PO TABS
10.0000 mg | ORAL_TABLET | Freq: Every day | ORAL | Status: DC
  Administered 2021-07-08 – 2021-07-11 (×4): 10 mg via ORAL
  Filled 2021-07-08 (×2): qty 2
  Filled 2021-07-08: qty 1
  Filled 2021-07-08: qty 2

## 2021-07-08 MED ORDER — GABAPENTIN 100 MG PO CAPS
200.0000 mg | ORAL_CAPSULE | Freq: Three times a day (TID) | ORAL | Status: DC
  Administered 2021-07-08 – 2021-07-11 (×8): 200 mg via ORAL
  Filled 2021-07-08 (×8): qty 2

## 2021-07-08 MED ORDER — OXYCODONE-ACETAMINOPHEN 5-325 MG PO TABS
2.0000 | ORAL_TABLET | Freq: Three times a day (TID) | ORAL | Status: DC | PRN
  Administered 2021-07-08 – 2021-07-11 (×8): 2 via ORAL
  Filled 2021-07-08 (×8): qty 2

## 2021-07-08 MED ORDER — ALPRAZOLAM 0.5 MG PO TABS
1.0000 mg | ORAL_TABLET | Freq: Three times a day (TID) | ORAL | Status: DC | PRN
  Administered 2021-07-08 – 2021-07-11 (×7): 1 mg via ORAL
  Filled 2021-07-08 (×6): qty 2

## 2021-07-08 MED ORDER — SERTRALINE HCL 50 MG PO TABS
150.0000 mg | ORAL_TABLET | Freq: Every day | ORAL | Status: DC
  Administered 2021-07-09 – 2021-07-11 (×3): 150 mg via ORAL
  Filled 2021-07-08 (×3): qty 3

## 2021-07-08 MED ORDER — ALUM & MAG HYDROXIDE-SIMETH 200-200-20 MG/5ML PO SUSP
30.0000 mL | Freq: Once | ORAL | Status: AC
  Administered 2021-07-08: 30 mL via ORAL
  Filled 2021-07-08: qty 30

## 2021-07-08 MED ORDER — FENTANYL CITRATE PF 50 MCG/ML IJ SOSY
50.0000 ug | PREFILLED_SYRINGE | INTRAMUSCULAR | Status: DC | PRN
  Administered 2021-07-08: 50 ug via INTRAVENOUS
  Filled 2021-07-08: qty 1

## 2021-07-08 MED ORDER — METHOCARBAMOL 500 MG PO TABS
500.0000 mg | ORAL_TABLET | Freq: Three times a day (TID) | ORAL | Status: DC | PRN
  Administered 2021-07-09: 500 mg via ORAL
  Filled 2021-07-08 (×2): qty 1

## 2021-07-08 NOTE — Plan of Care (Signed)
Discussed with patient and wife plan of care for the evening, pain management and complaints of gas with some teach back displayed.  MD ordered diet and anti-gas agent to help. ?

## 2021-07-08 NOTE — TOC Initial Note (Signed)
Transition of Care (TOC) - Initial/Assessment Note  ? ? ?Patient Details  ?Name: Henry Owens ?MRN: [id number] ?Date of Birth: 10-26-1960 ? ?Transition of Care (TOC) CM/SW Contact:    ?Shelbie Hutching, RN ?Phone Number: ?07/08/2021, 10:31 AM ? ?Clinical Narrative:                 ?Patient admitted to the hospital for acute respiratory failure due to COVID 19, negative Covid test in the hospital but positive test at home.  From home with wife.  Currently intubated and sedated.   ? ?TOC will follow.   ? ?Expected Discharge Plan: Home/Self Care ?Barriers to Discharge: Continued Medical Work up ? ? ?Patient Goals and CMS Choice ?Patient states their goals for this hospitalization and ongoing recovery are:: Patient intubated and sedated ?  ?  ? ?Expected Discharge Plan and Services ?Expected Discharge Plan: Home/Self Care ?  ?  ?  ?Living arrangements for the past 2 months: [place] ?                ?  ?  ?  ?  ?  ?  ?  ?  ?  ?  ? ?Prior Living Arrangements/Services ?Living arrangements for the past 2 months: [place] ?Lives with:: Spouse ?Patient language and need for interpreter reviewed:: Yes ?       ?Need for Family Participation in Patient Care: Yes (Comment) ?Care giver support system in place?: Yes (comment) (wife) ?  ?Criminal Activity/Legal Involvement Pertinent to Current Situation/Hospitalization: No - Comment as needed ? ?Activities of Daily Living ?  ?  ? ?Permission Sought/Granted ?  ?  ?   ?   ?   ?   ? ?Emotional Assessment ?Appearance:: Appears stated age ?Attitude/Demeanor/Rapport: Intubated (Following Commands or Not Following Commands) ?Affect (typically observed): Unable to Assess ?  ?Alcohol / Substance Use: Not Applicable ?Psych Involvement: No (comment) ? ?Admission diagnosis:  Shortness of breath [R06.02] ?Hypoxia [R09.02] ?Acute respiratory failure due to COVID-19 (HCC) [U07.1, J96.00] ?COVID-19 [U07.1] ?Patient Active Problem List  ? Diagnosis Date Noted  ? Acute  respiratory failure due to COVID-19 [facility]) 07/05/2021  ? Abnormal bone marrow examination 05/01/2020  ? ?PCP:  Albina Billet, MD ?Pharmacy:   ?[facility] ?[place] [address] ?Phone: [phone] Fax: [phone] ? ? ? ? ?Social Determinants of Health (SDOH) Interventions ?  ? ?Readmission Risk Interventions ?No flowsheet data found. ? ? ?

## 2021-07-08 NOTE — Procedures (Signed)
Extubation Procedure Note ? ?Patient Details:   ?Name: Henry Owens ?DOB: Dec 12, 1960 ?MRN: [id number] ?  ?Airway Documentation:  ?  ?Vent end date: 07/08/21 Vent end time: 1350  ? ?Evaluation ? O2 sats: stable throughout ?Complications: No apparent complications ?Patient did tolerate procedure well. ?Bilateral Breath Sounds: Clear, Diminished ?  ?Yes.  Able to cough and speak post procedure.  Placed on 4lpm [place].  MD at bedside. ? ?Rayne [place] Lesure ?07/08/2021, 1:54 PM ? ?

## 2021-07-08 NOTE — Consult Note (Addendum)
PHARMACY CONSULT NOTE ? ?Pharmacy Consult for Electrolyte Monitoring and Replacement  ? ?Recent Labs: ?Potassium (mmol/L)  ?Date Value  ?07/08/2021 4.9  ? ?Magnesium (mg/dL)  ?Date Value  ?07/07/2021 2.3  ? ?Calcium (mg/dL)  ?Date Value  ?07/08/2021 8.5 (L)  ? ?Albumin (g/dL)  ?Date Value  ?07/08/2021 3.2 (L)  ? ?Phosphorus (mg/dL)  ?Date Value  ?07/08/2021 3.4  ? ?Sodium (mmol/L)  ?Date Value  ?07/08/2021 139  ? ?Assessment: ?Patient is a 61 y/o M with medical history including chronic low back pain, tobacco use, MGUS, anxiety who is admitted with acute respiratory failure secondary to pneumonia. Patient was intubated 3/14. He remains intubated, sedated, and on mechanical ventilation in the ICU. Pharmacy consulted to assist with electrolyte monitoring and replacement as indicated. ? ?Goal of Therapy:  ?Electrolytes within normal limits ? ?Plan:  ?--Noted potassium trending up. Continue to monitor while on propofol ?--No electrolyte replacement indicated at this time ?--Pharmacy will continue to follow along ? ?Benita Gutter ?07/08/2021 7:42 AM  ?

## 2021-07-08 NOTE — Progress Notes (Addendum)
? ? ?NAME:  Henry Owens, MRN:  [id number], DOB:  [birth year], LOS: 3 ?ADMISSION DATE:  07/05/2021, CONSULTATION DATE:  3/14 ?REFERRING MD:  Harvest Dark MD CHIEF COMPLAINT: SOB  ? ?Brief Pt Description / Synopsis:  ?61 y.o. Male admitted with Severe Sepsis and Acute Hypoxic/Hypercapnic Respiratory Failure secondary to Aspiration/Atypical Pneumonia and RLL Mucus plugging requiring intubation and mechanical ventilation. ? ?HPI  ?61 y.o  male with significant PMH of MGUS, current smoker 1.5 PPD x 25 years, chronic low bak pain and anxiety who presented to the ED with chief complaints of SOB. ? ?Patient is currently intubated and sedated, histroy obtained from patient's chart and wife who is currently at the bedside. Per patient's wife, patient developed fevers and chills over the weekend with associated generalized malaise and decreased po intake. They both took a home COVID test 2 days ago which came back positive. Per patient's wife, patient was started on Paxlovid treatment by her PCP but symptoms got worse with new onset SOB prompting wife to call EMS. On EMS arrival, patient was found tobe hypoxic with sats in the 70s on RA therefore was placed on NRB with slight improvement to 85%. He was subsequently placed on CPAP and transported to the ED. ? ?ED Course: In the emergency department, the temperature was 103 (39.4?C), the heart rate  102 beats/minute, the blood pressure 105/73 mm Hg, the respiratory rate 21 breaths/minute, and the oxygen saturation 98% on BiPAP. He was lethargic, moaned intermittently, and responded with one-word answers; symmetric movement in the arms and legs was observed. Patient become more somnolent and less responsive to deep physical stimuli requiring intubation for airway protection. Initial chest xray showed no active disease . Follow up CTA chest showed no PE but positive for pneumonia and mucous plugging in the right lower lobe. Shortly after intubation, patient's bp  dropped following initiation of sedation requiring peripheral Levophwed and IVFs bolus. PCCM consulted for admission and further management. ? ?Pertinent Labs /Diagnostics Findings: ?Na+/ K+: 135/4.6 ?Glucose: 128 ?BUN/Cr.:11/1.29 ?  ?WBC/ TMAX: 7.0/ afebrile ?PCT: 0.73 ?Lactic acid: pending ?COVID PCR: Negative ?  ?Troponin: 31>41 ?BNP: 91 ? ?Past Medical History  ?Chronic low back pain ?Tobacco use ?MGUS ?Anxiety ? ?Significant Hospital Events   ?3/14: Admitted to ICU with acute hypoxic hypercapnic respiratory failure due to pneumonia ?3/16: Acute Hypoxic event due to suspected mucus plugging. No SBT today.  Plan for Bronch. ?3/17: On minimal vent settings, plan for SBT and hopeful for extubation ~ EXTUBATED in the afternoon ? ?Consults:  ?PCCM ? ?Procedures:  ?3/14: Intubation ?3/16: Bronchoscopy ? ?Significant Diagnostic Tests:  ?3/14: Chest Xray>Patchy bibasilar airspace disease consistent with history of COVID 19 pneumonia ?3/14: Noncontrast CT head>No acute intracranial abnormality ?3/14: CTA Chest>1. No CT evidence of central pulmonary artery embolus. ?2. Scattered interstitial nodularity most consistent with pneumonia, ?likely atypical. Clinical correlation is recommended. ?3. Mucous plugging of the right lower lobe bronchi with ?postobstructive atelectasis or infiltrate. Aspiration is not ?excluded. ?4. Mildly enlarged right hilar lymph nodes, likely reactive. ?5. A 4 mm right upper lobe nodule. Attention on follow-up imaging ?recommended. ? ?Micro Data:  ?3/14: SARS-CoV-2 PCR> negative ?3/14: Influenza PCR> negative ?3/14: Blood culture x2> no growth to date ?3/14: Urine Culture> ?3/14: MRSA PCR>> negative ?3/14: Strep pneumo urinary antigen>negative ?3/14: Legionella urinary antigen>negative ?3/16: BAL>> ? ?Antimicrobials:  ?Remdesivir 3/14 x 1 dose ?Aztreonam 3/15 x 1 dose ?Azithromycin 3/15>> ?Ceftriaxone 3/15>> ? ?OBJECTIVE  ?Blood pressure (!) 157/82, pulse 71, temperature 98.1 ?F (  36.7 ?C), resp.  rate 16, height _0  (1.778 m), weight 79.7 kg, SpO2 95 %. ?   ?Vent Mode: PSV;CPAP ?FiO2 (%):  [28 %-45 %] 28 % ?Set Rate:  [20 bmp] 20 bmp ?Vt Set:  [500 mL] 500 mL ?PEEP:  [5 cmH20] 5 cmH20 ?Pressure Support:  [5 cmH20] 5 cmH20 ?Plateau Pressure:  [10 KGM01-02 cmH20] 10 cmH20  ? ?Intake/Output Summary (Last 24 hours) at 07/08/2021 0808 ?Last data filed at 07/08/2021 0730 ?Gross per 24 hour  ?Intake 1635.26 ml  ?Output 830 ml  ?Net 805.26 ml  ? ? ?Filed Weights  ? 07/06/21 0200 07/07/21 0300 07/08/21 0300  ?Weight: 80.2 kg 80.2 kg 79.7 kg  ? ?Physical Examination  ?GENERAL: 61 year-old acutely ill patient lying in the bed intubated and lightly sedated, in NAD ?EYES: Pupils equal, round, reactive to light and accommodation. No scleral icterus. Extraocular muscles intact.  ?HEENT: Head atraumatic, normocephalic. Oropharynx and nasopharynx clear, orally intubated ?NECK:  Supple, no jugular venous distention. No thyroid enlargement, no tenderness.  ?LUNGS: coarse breath sounds bilaterally, no wheezing, rales,rhonchi or crepitation.  In PSV, even, no assessory muscle use ?CARDIOVASCULAR: Tachycardia, regular rhythm S1, S2. No murmurs, rubs, or gallops.  ?ABDOMEN: Soft, nontender, nondistended. Bowel sounds present. No organomegaly or mass.  ?EXTREMITIES: No pedal edema, cyanosis, or clubbing.  ?NEUROLOGIC: Lightly sedated (RASS o to -1), arouses easily to voice and follows commands, no focal deficits noted ?SKIN: No obvious rash, lesion, or ulcer.  ? ?Labs/imaging that I havepersonally reviewed  ?(right click and "Reselect all SmartList Selections" daily)  ? ?  ?Labs   ?CBC: ?Recent Labs  ?Lab 07/05/21 ?2133 07/06/21 ?0334 07/07/21 ?0344 07/08/21 ?0207  ?WBC 7.0 13.6* 9.9 9.5  ?NEUTROABS  --  12.3* 8.8* 8.0*  ?HGB 14.0 13.3 12.0* 11.6*  ?HCT 43.4 41.7 38.0* 35.3*  ?MCV 96.0 97.4 98.4 96.2  ?PLT 174 216 146* 152  ? ? ? ?Basic Metabolic Panel: ?Recent Labs  ?Lab 07/05/21 ?2133 07/06/21 ?0334 07/06/21 ?1049 07/07/21 ?0344  07/08/21 ?0207  ?NA 135 139  --  139 139  ?K 4.6 5.5* 4.0 4.1 4.9  ?CL 102 106  --  102 105  ?CO2 30 26  --  25 29  ?GLUCOSE 128* 208*  --  171* 136*  ?BUN 11 14  --  19 27*  ?CREATININE 1.29* 1.48*  --  1.20 1.05  ?CALCIUM 8.8* 9.0  --  8.8* 8.5*  ?MG  --  2.3  --  2.3  --   ?PHOS  --  2.7  --  2.9 3.4  ? ? ?GFR: ?Estimated Creatinine Clearance: 78.2 mL/min (by C-G formula based on SCr of 1.05 mg/dL). ?Recent Labs  ?Lab 07/05/21 ?2133 07/05/21 ?2358 07/06/21 ?7253 07/06/21 ?6644 07/07/21 ?0347 07/08/21 ?0207  ?PROCALCITON  --  0.73  --   --   --   --   ?WBC 7.0  --  13.6*  --  9.9 9.5  ?LATICACIDVEN  --   --  1.1 2.2*  --   --   ? ? ? ?Liver Function Tests: ?Recent Labs  ?Lab 07/05/21 ?2133 07/06/21 ?0334 07/07/21 ?0344 07/08/21 ?0207  ?AST 14* 12* 32 29  ?ALT _1 ?ALKPHOS 179* 159* 127* 100  ?BILITOT 0.7 0.6 0.3 0.4  ?PROT 7.8 7.1 6.8 6.0*  ?ALBUMIN 4.0 3.6 3.3* 3.2*  ? ? ?No results for input(s): LIPASE, AMYLASE in the last 168 hours. ?No results for input(s): AMMONIA in the  last 168 hours. ? ?ABG ?   ?Component Value Date/Time  ? PHART 7.24 (L) 07/06/2021 0747  ? PCO2ART 59 (H) 07/06/2021 0747  ? PO2ART 117 (H) 07/06/2021 0747  ? HCO3 25.3 07/06/2021 0747  ? ACIDBASEDEF 3.1 (H) 07/06/2021 0747  ? O2SAT 99.9 07/06/2021 0747  ?  ? ?Coagulation Profile: ?No results for input(s): INR, PROTIME in the last 168 hours. ? ?Cardiac Enzymes: ?No results for input(s): CKTOTAL, CKMB, CKMBINDEX, TROPONINI in the last 168 hours. ? ?HbA1C: ?Hgb A1c MFr Bld  ?Date/Time Value Ref Range Status  ?07/05/2021 11:58 PM 5.1 4.8 - 5.6 % Final  ?  Comment:  ?  (NOTE) ?        Prediabetes: 5.7 - 6.4 ?        Diabetes: >6.4 ?        Glycemic control for adults with diabetes: <7.0 ?  ? ? ?CBG: ?Recent Labs  ?Lab 07/07/21 ?1536 07/07/21 ?1916 07/07/21 ?2306 07/08/21 ?0327 07/08/21 ?5072  ?GLUCAP 195* 138* 141* 122* 121*  ? ? ? ?Review of Systems:   ?UNABLE TO OBTAIN PATIENT IS INTUBATED AND SEDATED ? ?Past Medical History  ?He,  has  a past medical history of Anxiety, Arthritis, and Flu (07/2017).  ? ?Surgical History   ? ?Past Surgical History:  ?Procedure Laterality Date  ? APPENDECTOMY  1975  ? BACK SURGERY    ? KNEE ARTHROSCOPY WITH

## 2021-07-09 DIAGNOSIS — U071 COVID-19: Secondary | ICD-10-CM | POA: Diagnosis not present

## 2021-07-09 DIAGNOSIS — J9600 Acute respiratory failure, unspecified whether with hypoxia or hypercapnia: Secondary | ICD-10-CM | POA: Diagnosis not present

## 2021-07-09 LAB — COMPREHENSIVE METABOLIC PANEL
ALT: 34 U/L (ref 0–44)
AST: 40 U/L (ref 15–41)
Albumin: 3.4 g/dL — ABNORMAL LOW (ref 3.5–5.0)
Alkaline Phosphatase: 117 U/L (ref 38–126)
Anion gap: 8 (ref 5–15)
BUN: 31 mg/dL — ABNORMAL HIGH (ref 6–20)
CO2: 29 mmol/L (ref 22–32)
Calcium: 8.7 mg/dL — ABNORMAL LOW (ref 8.9–10.3)
Chloride: 106 mmol/L (ref 98–111)
Creatinine, Ser: 0.87 mg/dL (ref 0.61–1.24)
GFR, Estimated: >60 mL/min (ref >60)
Glucose, Bld: 152 mg/dL — ABNORMAL HIGH (ref 70–99)
Potassium: 3.6 mmol/L (ref 3.5–5.1)
Sodium: 143 mmol/L (ref 135–145)
Total Bilirubin: 0.6 mg/dL (ref 0.3–1.2)
Total Protein: 6.2 g/dL — ABNORMAL LOW (ref 6.5–8.1)

## 2021-07-09 LAB — CBC WITH DIFFERENTIAL/PLATELET
Abs Immature Granulocytes: 0.06 10*3/uL (ref 0.00–0.07)
Basophils Absolute: 0 10*3/uL (ref 0.0–0.1)
Basophils Relative: 0 %
Eosinophils Absolute: 0 10*3/uL (ref 0.0–0.5)
Eosinophils Relative: 0 %
HCT: 37.5 % — ABNORMAL LOW (ref 39.0–52.0)
Hemoglobin: 12 g/dL — ABNORMAL LOW (ref 13.0–17.0)
Immature Granulocytes: 1 %
Lymphocytes Relative: 17 %
Lymphs Abs: 1.3 10*3/uL (ref 0.7–4.0)
MCH: 31.1 pg (ref 26.0–34.0)
MCHC: 32 g/dL (ref 30.0–36.0)
MCV: 97.2 fL (ref 80.0–100.0)
Monocytes Absolute: 0.6 10*3/uL (ref 0.1–1.0)
Monocytes Relative: 8 %
Neutro Abs: 5.8 10*3/uL (ref 1.7–7.7)
Neutrophils Relative %: 74 %
Platelets: 148 10*3/uL — ABNORMAL LOW (ref 150–400)
RBC: 3.86 MIL/uL — ABNORMAL LOW (ref 4.22–5.81)
RDW: 14.4 % (ref 11.5–15.5)
WBC: 7.7 10*3/uL (ref 4.0–10.5)
nRBC: 0 % (ref 0.0–0.2)

## 2021-07-09 LAB — GLUCOSE, CAPILLARY
Glucose-Capillary: 97 mg/dL (ref 70–99)
Glucose-Capillary: 106 mg/dL — ABNORMAL HIGH (ref 70–99)
Glucose-Capillary: 118 mg/dL — ABNORMAL HIGH (ref 70–99)

## 2021-07-09 LAB — PHOSPHORUS: Phosphorus: 2.5 mg/dL (ref 2.5–4.6)

## 2021-07-09 LAB — FERRITIN: Ferritin: 300 ng/mL (ref 24–336)

## 2021-07-09 LAB — C-REACTIVE PROTEIN: CRP: 4.5 mg/dL — ABNORMAL HIGH (ref <1.0)

## 2021-07-09 LAB — CULTURE, BAL-QUANTITATIVE W GRAM STAIN: Culture: >=100000 — AB

## 2021-07-09 LAB — D-DIMER, QUANTITATIVE: D-Dimer, Quant: 0.54 ug/mL-FEU — ABNORMAL HIGH (ref 0.00–0.50)

## 2021-07-09 LAB — MAGNESIUM: Magnesium: 2.5 mg/dL — ABNORMAL HIGH (ref 1.7–2.4)

## 2021-07-09 MED ORDER — ALUM & MAG HYDROXIDE-SIMETH 200-200-20 MG/5ML PO SUSP: 15.0000 mL | Freq: Four times a day (QID) | ORAL | Status: DC | PRN

## 2021-07-09 MED ORDER — ONDANSETRON HCL 4 MG/2ML IJ SOLN
4.0000 mg | Freq: Four times a day (QID) | INTRAMUSCULAR | Status: DC | PRN
  Administered 2021-07-09: 4 mg via INTRAVENOUS
  Filled 2021-07-09: qty 2

## 2021-07-09 MED ORDER — CALCIUM CARBONATE ANTACID 500 MG PO CHEW: 1.0000 | CHEWABLE_TABLET | Freq: Three times a day (TID) | ORAL | Status: DC | PRN

## 2021-07-09 MED ORDER — ONDANSETRON 4 MG PO TBDP
4.0000 mg | ORAL_TABLET | Freq: Three times a day (TID) | ORAL | Status: DC | PRN
Start: 2021-07-09 — End: 2021-07-11
  Filled 2021-07-09: qty 1

## 2021-07-09 MED ORDER — HYDRALAZINE HCL 20 MG/ML IJ SOLN: 10.0000 mg | Freq: Four times a day (QID) | INTRAMUSCULAR | Status: DC | PRN

## 2021-07-09 MED ORDER — ONDANSETRON HCL 4 MG/2ML IJ SOLN
4.0000 mg | Freq: Once | INTRAMUSCULAR | Status: AC
  Administered 2021-07-09: 4 mg via INTRAVENOUS
  Filled 2021-07-09: qty 2

## 2021-07-09 MED ORDER — SODIUM CHLORIDE 0.9 % IV SOLN
500.0000 mg | Freq: Every day | INTRAVENOUS | Status: AC
  Administered 2021-07-09 – 2021-07-10 (×2): 500 mg via INTRAVENOUS
  Filled 2021-07-09: qty 500
  Filled 2021-07-09: qty 5

## 2021-07-09 MED ORDER — ORAL CARE MOUTH RINSE
15.0000 mL | Freq: Two times a day (BID) | OROMUCOSAL | Status: DC
  Administered 2021-07-09 – 2021-07-11 (×5): 15 mL via OROMUCOSAL

## 2021-07-09 MED ORDER — ACETAMINOPHEN 500 MG PO TABS: 1000.0000 mg | ORAL_TABLET | Freq: Three times a day (TID) | ORAL | Status: DC | PRN

## 2021-07-09 NOTE — Progress Notes (Signed)
Clinical/Bedside Swallow Evaluation ?Patient Details  ?Name: Henry Owens ?MRN: [id number] ?Date of Birth: 04-19-1961 ? ?Today's Date: 07/09/2021 ?Time: SLP Start Time (ACUTE ONLY): 4287 SLP Stop Time (ACUTE ONLY): 1150 ?SLP Time Calculation (min) (ACUTE ONLY): 20 min ? ?Past Medical History:  ?Past Medical History:  ?Diagnosis Date  ? Anxiety   ? Arthritis   ? Flu 07/2017  ? ?Past Surgical History:  ?Past Surgical History:  ?Procedure Laterality Date  ? APPENDECTOMY  1975  ? BACK SURGERY    ? KNEE ARTHROSCOPY WITH MEDIAL MENISECTOMY Right 08/20/2017  ? Procedure: KNEE ARTHROSCOPY WITH PARTIAL MEDIAL AND LATERAL MENISECTOMY,PARTIAL SYNOVECTOMY,CHONDROPLASTY, LOOSE BODY REMOVAL;  Surgeon: Lovell Sheehan, MD;  Location: [facility] ORS;  Service: Orthopedics;  Laterality: Right;  ? [place]  ? 1996, 2000  ? ?HPI:  ?Patient is a 61 y.o. male with past medical history of MGUS, smoking, and anxiety admitted 07/05/21 with severe sepsis and acute hypoxic/hypercapnic respiratory failure secondary requiring intubation/mechanical ventilation. CXR 07/05/21 showed patchy bibasilar airspace disease consistent with history of COVID 19 pneumonia. Extubated 07/08/21.  ?  ?Assessment / Plan / Recommendation  ?Clinical Impression ?  Patient presents with mildly increased risk for aspiration due to recent intubation, deconditioning. Pt has been tolerating full liquid diet per RN, since extubation yesterday 07/08/21. Oral motor assessment is WFL, and pt's vocal quality is clear and strong with strong cough. He appears somewhat weak, needed assistance to hold and lift cup to his lips during assessment. Recommend OT consult. Pt presents without overt signs of aspiration or change in vocal quality with sips of thin liquids via cup and straw, and pudding. Pt with coughing immediately with saltine cracker, in the setting of prolonged mastication and xerostomia. With softened solid, pt exhibits more timely mastication and no overt s/sx  aspiration. Recommend dysphagia 2, thin liquids. RN reports pt tolerating meds whole with liquid. Can also attempt whole in puree if pt has difficulty when taking with liquids. SLP to follow up for tolerance, advancement. ? ? ?SLP Visit Diagnosis: Dysphagia, oropharyngeal phase (R13.12) ?   ?Aspiration Risk ? Mild aspiration risk  ?  ?Diet Recommendation Dysphagia 2 (Fine chop);Thin liquid  ? ?Liquid Administration via: Cup;Straw ?Medication Administration: Whole meds with liquid (can give in puree if necessary) ?Supervision: Staff to assist with self feeding ?Compensations: Slow rate;Small sips/bites ?Postural Changes: Seated upright at 90 degrees  ?  ?Other  Recommendations Oral Care Recommendations: Oral care BID   ? ?Recommendations for follow up therapy are one component of a multi-disciplinary discharge planning process, led by the attending physician.  Recommendations may be updated based on patient status, additional functional criteria and insurance authorization. ? ?Follow up Recommendations No SLP follow up  ? ? ?  ?Assistance Recommended at Discharge  (tbd)  ?Functional Status Assessment Patient has had a recent decline in their functional status and demonstrates the ability to make significant improvements in function in a reasonable and predictable amount of time.  ?Frequency and Duration min 2x/week  ?2 weeks ?  ?   ? ?Prognosis Prognosis for Safe Diet Advancement: Good  ? ?  ? ?Swallow Study   ?General Date of Onset: 07/05/21 ?HPI: Patient is a 61 y.o. male with past medical history of MGUS, smoking, and anxiety admitted 07/05/21 with severe sepsis and acute hypoxic/hypercapnic respiratory failure secondary requiring intubation/mechanical ventilation. CXR 07/05/21 showed patchy bibasilar airspace disease consistent with history of COVID 19 pneumonia. Extubated 07/08/21. ?Type of Study: Bedside Swallow Evaluation ?Previous  Swallow Assessment: none on file ?Diet Prior to this Study: Thin  liquids ?Temperature Spikes Noted: No ?Respiratory Status: Nasal cannula (1 L) ?History of Recent Intubation: Yes ?Length of Intubations (days): 3 days ?Date extubated: 07/08/21 ?Behavior/Cognition: Alert;Cooperative;Pleasant mood ?Oral Cavity Assessment: Within Functional Limits ?Oral Care Completed by SLP: Recent completion by staff ?Oral Cavity - Dentition: Dentures, top;Dentures, bottom ?Vision: Functional for self-feeding ?Self-Feeding Abilities: Needs assist;Other (Comment) (weak) ?Patient Positioning: Upright in bed ?Baseline Vocal Quality: Normal ?Volitional Cough: Strong ?Volitional Swallow: Able to elicit  ?  ?Oral/Motor/Sensory Function Overall Oral Motor/Sensory Function: Within functional limits   ?Ice Chips Ice chips: Within functional limits ?Presentation: Spoon   ?Thin Liquid Thin Liquid: Within functional limits ?Presentation: Cup;Straw;Self Fed  ?  ?Nectar Thick Nectar Thick Liquid: Not tested   ?Honey Thick Honey Thick Liquid: Not tested   ?Puree Puree: Within functional limits ?Presentation: Spoon   ?Solid ? ? ?  Solid: Impaired ?Presentation: Self Fed ?Oral Phase Impairments: Other (comment);Impaired mastication (xerostomia) ?Oral Phase Functional Implications: Impaired mastication ?Pharyngeal Phase Impairments: Cough - Immediate  ? ?  ?Deneise Lever, MS, CCC-SLP ?Speech-Language Pathologist ?[phone] ? ?Aliene Altes ?07/09/2021,11:57 AM ? ? ? ? ?

## 2021-07-09 NOTE — Consult Note (Signed)
PHARMACY CONSULT NOTE ? ?Pharmacy Consult for Electrolyte Monitoring and Replacement  ? ?Recent Labs: ?Potassium (mmol/L)  ?Date Value  ?07/09/2021 3.6  ? ?Magnesium (mg/dL)  ?Date Value  ?07/09/2021 2.5 (H)  ? ?Calcium (mg/dL)  ?Date Value  ?07/09/2021 8.7 (L)  ? ?Albumin (g/dL)  ?Date Value  ?07/09/2021 3.4 (L)  ? ?Phosphorus (mg/dL)  ?Date Value  ?07/09/2021 2.5  ? ?Sodium (mmol/L)  ?Date Value  ?07/09/2021 143  ? ?Assessment: ?Patient is a 61 y/o M with medical history including chronic low back pain, tobacco use, MGUS, anxiety who is admitted with acute respiratory failure secondary to pneumonia. Patient was intubated 3/14. He remains intubated, sedated, and on mechanical ventilation in the ICU. Pharmacy consulted to assist with electrolyte monitoring and replacement as indicated. ?-home test + COVID, however inpatient COVID negative with normal inflammatory markers ? ?Goal of Therapy:  ?Electrolytes within normal limits ? ?Plan:  ?--No electrolyte replacement indicated at this time ?--Pharmacy will continue to follow along ? ?Chamille Werntz A ?07/09/2021 9:10 AM  ?

## 2021-07-09 NOTE — Progress Notes (Signed)
?PROGRESS NOTE ? ? ? ?Henry Owens  YYQ:[id number] DOB: September 27, 1960 DOA: 07/05/2021 ?PCP: Albina Billet, MD  ?IC10A/IC10A-AA ? ? ?Assessment & Plan: ?  ?Principal Problem: ?  Acute respiratory failure due to COVID-19 [facility]) ? ? 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 ? ?On EMS arrival, patient was found tobe hypoxic with sats in the 70s on RA therefore was placed on NRB with slight improvement to 85%. He was subsequently placed on CPAP and transported to the ED.  Patient become more somnolent and less responsive to deep physical stimuli requiring intubation for airway protection.  Shortly after intubation, patient's bp dropped following initiation of sedation requiring peripheral Levophwed and IVFs bolus. PCCM consulted for admission and further management.   ? ?Pt was extubated on 3/17 and transferred to hospitalist service on 3/18. ?  ?Acute Hypoxic and Hypercapnic Respiratory Failure secondary to Aspiration/Atypical Pneumonia and RLL Mucus plugging ?PMHx: Everyday smoker ?-Initial concerns for COVID, home test + COVID, however inpatient COVID negative  ?-CTA negative for PE ?--intubated in the ED, extubated on 3/17 and weaned down to 4L today. ?--Bronch on 3/16 found Thick Mucoid secretions and mucus plugs  in all segments of the airways. ?--received IV solumedrol during ICU stay ?Plan: ?-Scheduled & PRN Bronchodilators ?--d/c IV solumedrol ?--cont abx ?--SLP eval ?  ?Severe Sepsis secondary to CAP ?--fever 103, tachycardia, tachypnea, with AKI ?--cont ceftriaxone and azithromycin ?  ?Acute Metabolic Encephalopathy due to above, improved ?-CT Head negative for acute intracranial abnormality ?-Urine drug screen positive for benzodiazepines, cannabinoids, opiates, tricyclic's ?  ?AKI likely prerenal int he setting of hypotension ~ IMPROVING ?--Cr peaked at 1.48.  improved to 0.87  today. ?  ?Mild Thrombocytopenia, likely in setting of Sepsis ~ RESOLVED ?  ?Hyperglycemia ?--likely stress response and having received IV solumedrol ?-Hemoglobin A1c is 5.1 ?--d/c BG checks and SSI ? ?HTN ?--hypotensive initially after sedation given for intubation.  BP now becoming elevated. ?Plan: ?--cont home Lisinopril ?--IV hydralazine PRN ?  ? ?DVT prophylaxis: Lovenox SQ ?Code Status: Full code  ?Family Communication:  ?Level of care: Med-Surg ?Dispo:   ?The patient is from: home ?Anticipated d/c is to: home ?Anticipated d/c date is: 2-3 days ?Patient currently is not medically ready to d/c due to: on 4L O2, on IV abx ? ? ?Subjective and Interval History:  ?Pt was extubated and weaned down to 4L O2.  Pt complained of nausea.  No swallow issues.  ? ? ?Objective: ?Vitals:  ? 07/09/21 1000 07/09/21 1100 07/09/21 1200 07/09/21 1242  ?BP: (!) 151/79  (!) 183/88 (!) 144/78  ?Pulse: 96  96 89  ?Resp:   (!) 7 20  ?Temp: 99.5 ?F (37.5 ?C)  99.3 ?F (37.4 ?C)   ?TempSrc:   Bladder   ?SpO2: 90% 96% 94% 98%  ?Weight:      ?Height:      ? ? ?Intake/Output Summary (Last 24 hours) at 07/09/2021 1333 ?Last data filed at 07/09/2021 1200 ?Gross per 24 hour  ?Intake 598.66 ml  ?Output 1500 ml  ?Net -901.34 ml  ? ?Filed Weights  ? 07/07/21 0300 07/08/21 0300 07/09/21 0418  ?Weight: 80.2 kg 79.7 kg 77.4 kg  ? ? ?Examination:  ? ?Constitutional: NAD, AAOx3 ?HEENT: conjunctivae and lids normal, EOMI ?CV: No cyanosis.   ?RESP: normal respiratory effort, on 4L ?Extremities: No effusions,  edema in BLE ?SKIN: warm, dry ?Neuro: II - XII grossly intact.   ?Psych: Normal mood and affect.  Appropriate judgement and reason ? ? ?Data Reviewed: I have personally reviewed following labs and imaging studies ? ?CBC: ?Recent Labs  ?Lab 07/05/21 ?2133 07/06/21 ?5361 07/07/21 ?4431 07/08/21 ?0207 07/09/21 ?5400  ?WBC 7.0 13.6* 9.9 9.5 7.7  ?NEUTROABS  --  12.3* 8.8* 8.0* 5.8  ?HGB 14.0 13.3 12.0* 11.6* 12.0*  ?HCT 43.4 41.7 38.0* 35.3* 37.5*  ?MCV  96.0 97.4 98.4 96.2 97.2  ?PLT 174 216 146* 152 148*  ? ?Basic Metabolic Panel: ?Recent Labs  ?Lab 07/05/21 ?2133 07/06/21 ?0334 07/06/21 ?1049 07/07/21 ?0344 07/08/21 ?0207 07/09/21 ?8676  ?NA 135 139  --  139 139 143  ?K 4.6 5.5* 4.0 4.1 4.9 3.6  ?CL 102 106  --  102 105 106  ?CO2 30 26  --  '25 29 29  '$ ?GLUCOSE 128* 208*  --  171* 136* 152*  ?BUN 11 14  --  19 27* 31*  ?CREATININE 1.29* 1.48*  --  1.20 1.05 0.87  ?CALCIUM 8.8* 9.0  --  8.8* 8.5* 8.7*  ?MG  --  2.3  --  2.3  --  2.5*  ?PHOS  --  2.7  --  2.9 3.4 2.5  ? ?GFR: ?Estimated Creatinine Clearance: 94.4 mL/min (by C-G formula based on SCr of 0.87 mg/dL). ?Liver Function Tests: ?Recent Labs  ?Lab 07/05/21 ?2133 07/06/21 ?1950 07/07/21 ?9326 07/08/21 ?0207 07/09/21 ?7124  ?AST 14* 12* 32 29 40  ?ALT '14 12 14 14 '$ 34  ?ALKPHOS 179* 159* 127* 100 117  ?BILITOT 0.7 0.6 0.3 0.4 0.6  ?PROT 7.8 7.1 6.8 6.0* 6.2*  ?ALBUMIN 4.0 3.6 3.3* 3.2* 3.4*  ? ?No results for input(s): LIPASE, AMYLASE in the last 168 hours. ?No results for input(s): AMMONIA in the last 168 hours. ?Coagulation Profile: ?No results for input(s): INR, PROTIME in the last 168 hours. ?Cardiac Enzymes: ?No results for input(s): CKTOTAL, CKMB, CKMBINDEX, TROPONINI in the last 168 hours. ?BNP (last 3 results) ?No results for input(s): PROBNP in the last 8760 hours. ?HbA1C: ?No results for input(s): HGBA1C in the last 72 hours. ?CBG: ?Recent Labs  ?Lab 07/08/21 ?1945 07/08/21 ?2345 07/09/21 ?0328 07/09/21 ?5809 07/09/21 ?1107  ?GLUCAP 101* 102* 97 106* 118*  ? ?Lipid Profile: ?No results for input(s): CHOL, HDL, LDLCALC, TRIG, CHOLHDL, LDLDIRECT in the last 72 hours. ?Thyroid Function Tests: ?No results for input(s): TSH, T4TOTAL, FREET4, T3FREE, THYROIDAB in the last 72 hours. ?Anemia Panel: ?Recent Labs  ?  07/08/21 ?0207 07/09/21 ?9833  ?FERRITIN 269 300  ? ?Sepsis Labs: ?Recent Labs  ?Lab 07/05/21 ?2358 07/06/21 ?0334 07/06/21 ?8250  ?PROCALCITON 0.73  --   --   ?LATICACIDVEN  --  1.1 2.2*   ? ? ?Recent Results (from the past 240 hour(s))  ?Culture, blood (routine x 2)     Status: None (Preliminary result)  ? Collection Time: 07/05/21  9:23 PM  ? Specimen: BLOOD  ?Result Value Ref Range Status  ? Specimen Description BLOOD RIGHT HAND  Final  ? Special Requests   Final  ?  BOTTLES DRAWN AEROBIC AND ANAEROBIC Blood Culture results may not be optimal due to an excessive volume of blood received in culture bottles  ? Culture   Final  ?  NO GROWTH 4 DAYS ?Performed at [facility], [address], [place], [place] [address] ?  ? Report Status PENDING  Incomplete  ?Culture, blood (routine  x 2)     Status: None (Preliminary result)  ? Collection Time: 07/05/21  9:23 PM  ? Specimen: BLOOD  ?Result Value Ref Range Status  ? Specimen Description BLOOD LEFT FOREARM  Final  ? Special Requests   Final  ?  BOTTLES DRAWN AEROBIC AND ANAEROBIC Blood Culture results may not be optimal due to an excessive volume of blood received in culture bottles  ? Culture   Final  ?  NO GROWTH 4 DAYS ?Performed at [facility], [address], [place], [place] [address] ?  ? Report Status PENDING  Incomplete  ?Resp Panel by RT-PCR (Flu A&B, Covid) Nasopharyngeal Swab     Status: None  ? Collection Time: 07/05/21 10:09 PM  ? Specimen: Nasopharyngeal Swab; Nasopharyngeal(NP) swabs in vial transport medium  ?Result Value Ref Range Status  ? SARS Coronavirus 2 by RT PCR NEGATIVE NEGATIVE Final  ?  Comment: (NOTE) ?SARS-CoV-2 target nucleic acids are NOT DETECTED. ? ?The SARS-CoV-2 RNA is generally detectable in upper respiratory ?specimens during the acute phase of infection. The lowest ?concentration of SARS-CoV-2 viral copies this assay can detect is ?138 copies/mL. A negative result does not preclude SARS-Cov-2 ?infection and should not be used as the sole basis for treatment or ?other patient management decisions. A negative result may occur with  ?improper specimen collection/handling, submission of specimen  other ?than nasopharyngeal swab, presence of viral mutation(s) within the ?areas targeted by this assay, and inadequate number of viral ?copies(<138 copies/mL). A negative result must be combined with ?clinical observation

## 2021-07-09 NOTE — Progress Notes (Signed)
Patient had several bowel movements throughout the night.  Patient tolerated a clear breeze drink this morning.  Start off slow to full liquid with breakfast 2g sodium diet. ?

## 2021-07-09 NOTE — Plan of Care (Signed)
Discussed with patient and wife plan of care for the evening, voiding after foley removal and pain management with some teach back displayed.  Patient understands that he may need an in out catheterization if he doesn't void after foley removal last bladder scan 513 ml.  Patient is still waiting til pain is at a higher level before stating he is in pain.  Discussed if we handle when lower on the scale its better. ? ?Problem: Education: ?Goal: Knowledge of General Education information will improve ?Description: Including pain rating scale, medication(s)/side effects and non-pharmacologic comfort measures ?Outcome: Progressing ?  ?Problem: Health Behavior/Discharge Planning: ?Goal: Ability to manage health-related needs will improve ?Outcome: Progressing ?  ?

## 2021-07-10 DIAGNOSIS — J9600 Acute respiratory failure, unspecified whether with hypoxia or hypercapnia: Secondary | ICD-10-CM | POA: Diagnosis not present

## 2021-07-10 DIAGNOSIS — U071 COVID-19: Secondary | ICD-10-CM | POA: Diagnosis not present

## 2021-07-10 LAB — CBC
HCT: 37.4 % — ABNORMAL LOW (ref 39.0–52.0)
Hemoglobin: 11.9 g/dL — ABNORMAL LOW (ref 13.0–17.0)
MCH: 30.8 pg (ref 26.0–34.0)
MCHC: 31.8 g/dL (ref 30.0–36.0)
MCV: 96.9 fL (ref 80.0–100.0)
Platelets: 160 10*3/uL (ref 150–400)
RBC: 3.86 MIL/uL — ABNORMAL LOW (ref 4.22–5.81)
RDW: 13.9 % (ref 11.5–15.5)
WBC: 7.8 10*3/uL (ref 4.0–10.5)
nRBC: 0 % (ref 0.0–0.2)

## 2021-07-10 LAB — CULTURE, BLOOD (ROUTINE X 2)
Culture: NO GROWTH
Culture: NO GROWTH

## 2021-07-10 LAB — BASIC METABOLIC PANEL
Anion gap: 7 (ref 5–15)
BUN: 24 mg/dL — ABNORMAL HIGH (ref 6–20)
CO2: 27 mmol/L (ref 22–32)
Calcium: 8.5 mg/dL — ABNORMAL LOW (ref 8.9–10.3)
Chloride: 106 mmol/L (ref 98–111)
Creatinine, Ser: 0.82 mg/dL (ref 0.61–1.24)
GFR, Estimated: >60 mL/min (ref >60)
Glucose, Bld: 99 mg/dL (ref 70–99)
Potassium: 4 mmol/L (ref 3.5–5.1)
Sodium: 140 mmol/L (ref 135–145)

## 2021-07-10 LAB — MAGNESIUM: Magnesium: 2.8 mg/dL — ABNORMAL HIGH (ref 1.7–2.4)

## 2021-07-10 NOTE — Evaluation (Signed)
Occupational Therapy Evaluation Patient Details Name: Henry Owens MRN: [id number] DOB: 26-Apr-1960 Today's Date: 07/10/2021   History of Present Illness 61 yo male who presented to ER secondary to SOB, fever, chills; admitted for management of acute hypoxic respiratory failure secondary to atypical/COVID-19 PNA, mucous plugging. Intubated 3/14-3/17/23. PMH significant for: monoclonal gammopathy of undetermined significance (MGUS), current smoker, chronic low back pain, and anxiety   Clinical Impression   Pt seen for OT evaluation this date. Upon arrival to room, pt awake and sitting upright in recliner with wife present. Pt agreeable to OT eval/tx. At baseline, pt performs most ADLs/iADLs with MIN GUARD from wife d/t decreased activity tolerance/balance (requires physical assist for donning socks/shoes d/t chronic back pain). Pt lives with his wife in a 1-level home with 1 step to enter. Pt currently requires MIN GUARD-MIN A for functional mobility of short household distances with RW, MIN GUARD for toilet transfers, MIN A for toilet hygiene, and MIN GUARD for standing hand hygiene. Compared to baseline, pt presents with decreased balance, strength, and activity tolerance. Of note, SpO2>94% while on RA. Pt would benefit from additional skilled OT services to maximize return to PLOF and minimize risk of future falls, injury, caregiver burden, and readmission. Upon discharge, recommend HHOT services.         Recommendations for follow up therapy are one component of a multi-disciplinary discharge planning process, led by the attending physician.  Recommendations may be updated based on patient status, additional functional criteria and insurance authorization.   Follow Up Recommendations  Home health OT    Assistance Recommended at Discharge Frequent or constant Supervision/Assistance  Patient can return home with the following A little help with walking and/or transfers;A lot of help with  bathing/dressing/bathroom;Assistance with cooking/housework    Functional Status Assessment  Patient has had a recent decline in their functional status and demonstrates the ability to make significant improvements in function in a reasonable and predictable amount of time.  Equipment Recommendations  Tub/shower bench       Precautions / Restrictions Precautions Precautions: Fall Restrictions Weight Bearing Restrictions: No      Mobility Bed Mobility               General bed mobility comments: not assessed, pt in recliner pre/post session    Transfers Overall transfer level: Needs assistance Equipment used: Rolling walker (2 wheels) Transfers: Sit to/from Stand Sit to Stand: Min guard           General transfer comment: No physical assistance provided. Required cues for safe UE placement with RW use      Balance Overall balance assessment: Needs assistance Sitting-balance support: No upper extremity supported, Feet supported Sitting balance-Leahy Scale: Fair Sitting balance - Comments: Good static sitting balance. Fair dynamic sitting balance   Standing balance support: Bilateral upper extremity supported, During functional activity Standing balance-Leahy Scale: Poor Standing balance comment: x1 lateral LOB requiring MIN A for steadying, otherwise MIN GUARD to walk to/from bathroom (~28ft)                           ADL either performed or assessed with clinical judgement   ADL Overall ADL's : Needs assistance/impaired     Grooming: Wash/dry hands;Min guard;Standing           Upper Body Dressing : Supervision/safety;Set up;Sitting Upper Body Dressing Details (indicate cue type and reason): to don/doff hospital gown. Requires SUPERVISION when leaning laterally to remove gown  from under hips     Toilet Transfer: Min guard;BSC/3in1;Rolling walker (2 wheels);Ambulation (BSC frame over toilet)   Toileting- Clothing Manipulation and Hygiene:  Minimal assistance;Sitting/lateral lean Toileting - Clothing Manipulation Details (indicate cue type and reason): MIN A to ensure thoroughness of posterior peri-care     Functional mobility during ADLs: Min guard;Minimal assistance;Rolling walker (2 wheels) (required MIN A for x1 lateral LOB, otherwise required only MIN A)       Vision Baseline Vision/History: 0 No visual deficits Ability to See in Adequate Light: 0 Adequate Patient Visual Report: No change from baseline              Pertinent Vitals/Pain Pain Assessment Pain Assessment: Faces Faces Pain Scale: Hurts even more Pain Location: neck Pain Descriptors / Indicators: Aching Pain Intervention(s): Limited activity within patient's tolerance, Monitored during session        Extremity/Trunk Assessment Upper Extremity Assessment Upper Extremity Assessment: Generalized weakness (shoulder elevation limited to shoulder height only (endorses chronic disc issues). Also endorses numbness/tingling in hands at baseline)   Lower Extremity Assessment Lower Extremity Assessment: Generalized weakness       Communication Communication Communication: HOH   Cognition Arousal/Alertness: Awake/alert Behavior During Therapy: WFL for tasks assessed/performed Overall Cognitive Status: Impaired/Different from baseline                                 General Comments: Pt alert and oriented to self, place, month/year, and some aspects of situation. Pt's wife reporting his cogntion is not at baseline as he now requires increased processing time     General Comments  SpO2>94% while on RA            Home Living Family/patient expects to be discharged to:: Private residence Living Arrangements: Spouse/significant other Available Help at Discharge: Available PRN/intermittently Type of Home: House Home Access: Stairs to enter [employer] of Steps: 1   Home Layout: One level     Bathroom Shower/Tub:  Chief Strategy Officer: Standard (has toilet riser with handles)     Home Equipment: Cane - single point;Toilet riser (toilet riser with handles)          Prior Functioning/Environment Prior Level of Function : Independent/Modified Independent             Mobility Comments: Mod indep for household mobilization; intermittent use of SPC as needed (due to chronic pain), does tend to use facility [facility] for longer-distances (typically going out of house only for appts) ADLs Comments: At baseline, pt requires MIN GUARD from wife for most ADLs/iADLs d/t decreased balance and activity tolerance. Requires physical assist for donning socks/shoes, but is able to don pants MIN GUARD        OT Problem List: Decreased strength;Decreased activity tolerance;Impaired balance (sitting and/or standing);Decreased safety awareness;Decreased knowledge of use of DME or AE      OT Treatment/Interventions: Self-care/ADL training;Therapeutic exercise;Energy conservation;DME and/or AE instruction;Therapeutic activities;Patient/family education;Balance training    OT Goals(Current goals can be found in the care plan section) Acute Rehab OT Goals Patient Stated Goal: to regain strength OT Goal Formulation: With patient/family Time For Goal Achievement: 07/24/21 Potential to Achieve Goals: Good ADL Goals Pt Will Perform Grooming: with supervision;standing (while standing for >5 mins) Pt Will Transfer to Toilet: with supervision;ambulating;regular height toilet Pt Will Perform Toileting - Clothing Manipulation and hygiene: with supervision;sitting/lateral leans  OT Frequency: Min 2X/week  AM-PAC OT "6 Clicks" Daily Activity     Outcome Measure Help from another person eating meals?: None Help from another person taking care of personal grooming?: A Little Help from another person toileting, which includes using toliet, bedpan, or urinal?: A Little Help from another person bathing  (including washing, rinsing, drying)?: A Lot Help from another person to put on and taking off regular upper body clothing?: A Little Help from another person to put on and taking off regular lower body clothing?: A Lot 6 Click Score: 17   End of Session Equipment Utilized During Treatment: Rolling walker (2 wheels) Nurse Communication: Mobility status  Activity Tolerance: Patient tolerated treatment well Patient left: in chair;with call bell/phone within reach;with family/visitor present  OT Visit Diagnosis: Unsteadiness on feet (R26.81);Muscle weakness (generalized) (M62.81)                Time: 4098-1191 OT Time Calculation (min): 29 min Charges:  OT General Charges $OT Visit: 1 Visit OT Evaluation $OT Eval Moderate Complexity: 1 Mod OT Treatments $Self Care/Home Management : 8-22 mins  Matthew Folks, OTR/L [facility] [phone]

## 2021-07-10 NOTE — TOC Progression Note (Addendum)
Transition of Care (TOC) - Progression Note  ? ? ?Patient Details  ?Name: Henry Owens ?MRN: [id number] ?Date of Birth: April 10, 1961 ? ?Transition of Care (TOC) CM/SW Contact  ?Zyla Dascenzo E Maheen Cwikla, LCSW ?Phone Number: ?07/10/2021, 2:37 PM ? ?Clinical Narrative:   PT and OT rec home health, tub bench, and RW.  ?Spoke with patient who reported he lives at home with his wife, 2 children, and 1 grandchild. His wife provides transportation. PCP is Dr. Hall Busing. Pharmacy is [facility]. Patient has no DME, HH, or SNF history. Confirmed home address listed in chart.  ?Patient is agreeable to PT and OT recs. No agency preferences. Tub bench and RW referral made to [facility] with [employer].  ?Trying to establish [facility] coverage. Corene Cornea with [place] with [place] [facility] are unable to take patient's insurance. Cory with [facility] is checking. Asked him to update RNCM tomorrow if no answer today. ? ? ? ?Expected Discharge Plan: [place] ?Barriers to Discharge: Continued Medical Work up ? ?Expected Discharge Plan and Services ?Expected Discharge Plan: [place] ?  ?  ?  ?Living arrangements for the past 2 months: [place] ?                ?  ?  ?  ?  ?  ?  ?  ?  ?  ?  ? ? ?Social Determinants of Health (SDOH) Interventions ?  ? ?Readmission Risk Interventions ?Readmission Risk Prevention Plan 07/10/2021  ?Transportation Screening Complete  ?PCP or Specialist Appt within 5-7 Days Complete  ?Home Care Screening Complete  ?Medication Review (RN CM) Complete  ?Some recent data might be hidden  ? ? ?

## 2021-07-10 NOTE — Evaluation (Signed)
Physical Therapy Evaluation ?Patient Details ?Name: Henry Owens ?MRN: [id number] ?DOB: [birth year] ?Today's Date: 07/10/2021 ? ?History of Present Illness ? presented to ER secondary to SOB, fever, chills; admitted for management of acute hypoxic respiratory failure secondary to atypical/COVID-19 PNA, mucous plugging. Intubated 3/14-3/17/23.  ?Clinical Impression ? Patient resting in bed upon arrival to room; wife at bedside. Patient alert and oriented to basic information; mild delay in processing and response time (feels "fuzzy" at times).  Endorses chronic pain (baseline arthritic pain), FACES 4-6/10; meds requested per RN, but not available at this time.  Generalized weakness noted throughout all extremities; however, symmetrical and WFL for basic transfers and gait efforts.  Able to complete bed mobility with min assist; sit/stand, basic transfers and gait (25') with RW, min assist.  Demonstrates mild forward flexed posture, mod WBing on RW; short steps with limited height; slow and guarded. Limited balance reactions; do recommend continued use of RW and +1 for all transfers to optimize safety/indep.  Vitals stable and WFL; sats >94-95% on RA at rest and with exertion. ?Would benefit from skilled PT to address above deficits and promote optimal return to PLOF.;Recommend transition to HHPT upon discharge from acute hospitalization. ? ?   ? ?Recommendations for follow up therapy are one component of a multi-disciplinary discharge planning process, led by the attending physician.  Recommendations may be updated based on patient status, additional functional criteria and insurance authorization. ? ?Follow Up Recommendations Home health PT ? ?  ?Assistance Recommended at Discharge PRN  ?Patient can return home with the following ? A little help with walking and/or transfers;A little help with bathing/dressing/bathroom;Assistance with cooking/housework;Help with stairs or ramp for entrance ? ?  ?Equipment  Recommendations Rolling walker (2 wheels)  ?Recommendations for Other Services ?    ?  ?Functional Status Assessment Patient has had a recent decline in their functional status and demonstrates the ability to make significant improvements in function in a reasonable and predictable amount of time.  ? ?  ?Precautions / Restrictions Precautions ?Precautions: Fall ?Restrictions ?Weight Bearing Restrictions: No  ? ?  ? ?Mobility ? Bed Mobility ?Overal bed mobility: Needs Assistance ?Bed Mobility: Supine to Sit ?  ?  ?Supine to sit: Min guard, Min assist ?  ?  ?General bed mobility comments: assist for truncal elevation with transition through R sidelying ?  ? ?Transfers ?Overall transfer level: Needs assistance ?Equipment used: Rolling walker (2 wheels) ?Transfers: Sit to/from Stand ?Sit to Stand: Min assist, Mod assist ?  ?  ?  ?  ?  ?General transfer comment: cuing for hand placement; increased assist for lift off (does improve with repetition) ?  ? ?Ambulation/Gait ?Ambulation/Gait assistance: Min assist ?Gait Distance (Feet): 25 Feet ?Assistive device: Rolling walker (2 wheels) ?  ?  ?  ?  ?General Gait Details: mild forward flexed posture, mod WBing on RW; short steps with limited height; slow and guarded. Limited balance reactions; do recommend continued use of RW and +1 for all transfers to optimize safety/indep ? ?Stairs ?  ?  ?  ?  ?  ? ?Wheelchair Mobility ?  ? ?Modified Rankin (Stroke Patients Only) ?  ? ?  ? ?Balance Overall balance assessment: Needs assistance ?Sitting-balance support: No upper extremity supported, Feet supported ?Sitting balance-Leahy Scale: Good ?  ?  ?Standing balance support: Bilateral upper extremity supported ?Standing balance-Leahy Scale: Fair ?  ?  ?  ?  ?  ?  ?  ?  ?  ?  ?  ?  ?   ? ? ? ?  Pertinent Vitals/Pain Pain Assessment ?Pain Assessment: Faces ?Faces Pain Scale: Hurts even more ?Pain Descriptors / Indicators: Aching ?Pain Intervention(s): Limited activity within patient's  tolerance, Monitored during session, Repositioned (per RN, additional medication not available at this time)  ? ? ?Home Living Family/patient expects to be discharged to:: Private residence ?Living Arrangements: Spouse/significant other ?Available Help at Discharge: Available PRN/intermittently ?Type of Home: House ?Home Access: Stairs to enter ?  ?Entrance Stairs-Number of Steps: 1 ?  ?Home Layout: One level ?Home Equipment: Kasandra Knudsen - single point ?   ?  ?Prior Function Prior Level of Function : Independent/Modified Independent ?  ?  ?  ?  ?  ?  ?Mobility Comments: Mod indep for household mobilization; intermittent use of SPC as needed (due to chronic pain), does tend to use facility [facility] for longer-distances (typically going out of house only for appts) ?ADLs Comments: Wife assists with bathing/ADLs and iADLs as needed ?  ? ? ?Hand Dominance  ?   ? ?  ?Extremity/Trunk Assessment  ? Upper Extremity Assessment ?Upper Extremity Assessment: Generalized weakness (shoulder elevation limited to shoulder height only (endorses chronic disc issues); denies sensory deficit) ?  ? ?Lower Extremity Assessment ?Lower Extremity Assessment: Generalized weakness (grossly 4-/5 throughout) ?  ? ?   ?Communication  ? Communication: No difficulties  ?Cognition Arousal/Alertness: Awake/alert ?Behavior During Therapy: [facility] for tasks assessed/performed ?Overall Cognitive Status: Within Functional Limits for tasks assessed ?  ?  ?  ?  ?  ?  ?  ?  ?  ?  ?  ?  ?  ?  ?  ?  ?  ?  ?  ? ?  ?General Comments   ? ?  ?Exercises Other Exercises ?Other Exercises: Reviewed role of PT and progressive mobility, importance of OOB; reviewed safety needs/recommendations with transfers/gait; encouraged OOB for toileting with staff assist (for skin protection and improved bowel/bladder control). Patient/wife voiced understanding and agreement.  ? ?Assessment/Plan  ?  ?PT Assessment Patient needs continued PT services  ?PT Problem List Decreased strength;Decreased  activity tolerance;Decreased balance;Decreased mobility;Decreased coordination;Decreased cognition;Decreased knowledge of use of DME;Decreased safety awareness;Decreased knowledge of precautions;Cardiopulmonary status limiting activity ? ?   ?  ?PT Treatment Interventions DME instruction;Gait training;Functional mobility training;Therapeutic activities;Therapeutic exercise;Patient/family education   ? ?PT Goals (Current goals can be found in the Care Plan section)  ?Acute Rehab PT Goals ?Patient Stated Goal: to return home ?PT Goal Formulation: With patient ?Time For Goal Achievement: 07/24/21 ?Potential to Achieve Goals: Good ? ?  ?Frequency Min 2X/week ?  ? ? ?Co-evaluation   ?  ?  ?  ?  ? ? ?  ?AM-PAC PT "6 Clicks" Mobility  ?Outcome Measure Help needed turning from your back to your side while in a flat bed without using bedrails?: None ?Help needed moving from lying on your back to sitting on the side of a flat bed without using bedrails?: A Little ?Help needed moving to and from a bed to a chair (including a wheelchair)?: A Little ?Help needed standing up from a chair using your arms (e.g., wheelchair or bedside chair)?: A Little ?Help needed to walk in hospital room?: A Little ?Help needed climbing 3-5 steps with a railing? : A Little ?6 Click Score: 19 ? ?  ?End of Session Equipment Utilized During Treatment: Gait belt ?Activity Tolerance: Patient tolerated treatment well ?Patient left: in chair;with call bell/phone within reach;with family/visitor present (Fall risk score 8, alarm not required) ?Nurse Communication: Mobility status ?PT Visit Diagnosis:  Muscle weakness (generalized) (M62.81);Difficulty in walking, not elsewhere classified (R26.2) ?  ? ?Time: 0254-2706 ?PT Time Calculation (min) (ACUTE ONLY): 22 min ? ? ?Charges:   PT Evaluation ?$PT Eval Moderate Complexity: 1 Mod ?PT Treatments ?$Therapeutic Activity: 8-22 mins ?  ?   ? ? ?Elesha Thedford H. Owens Shark, PT, DPT, NCS ?07/10/21, 11:21  AM ?[phone] ? ? ?

## 2021-07-10 NOTE — Progress Notes (Signed)
?PROGRESS NOTE ? ? ? ?Henry Owens  FGH:[id number] DOB: 1961-01-30 DOA: 07/05/2021 ?PCP: Albina Billet, MD  ?[phone] ? ? ?Assessment & Plan: ?  ?Principal Problem: ?  Acute respiratory failure due to COVID-19 [facility]) ? ? 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 ? ?On EMS arrival, patient was found tobe hypoxic with sats in the 70s on RA therefore was placed on NRB with slight improvement to 85%. He was subsequently placed on CPAP and transported to the ED.  Patient become more somnolent and less responsive to deep physical stimuli requiring intubation for airway protection.  Shortly after intubation, patient's bp dropped following initiation of sedation requiring peripheral Levophwed and IVFs bolus. PCCM consulted for admission and further management.   ? ?Pt was extubated on 3/17 and transferred to hospitalist service on 3/18. ?  ?Acute Hypoxic and Hypercapnic Respiratory Failure secondary to Aspiration/Atypical Pneumonia and RLL Mucus plugging ?PMHx: Everyday smoker ?-Initial concerns for COVID, home test + COVID, however inpatient COVID negative  ?-CTA negative for PE ?--intubated in the ED, extubated on 3/17 and weaned down to RA today. ?--Bronch on 3/16 found Thick Mucoid secretions and mucus plugs  in all segments of the airways. ?--received IV solumedrol during ICU stay, d/c'ed on 3/18. ?Plan: ?--Scheduled & PRN Bronchodilators ?--cont abx for PNA ? ?Severe Sepsis secondary to CAP ?--fever 103, tachycardia, tachypnea, with AKI ?--cont ceftriaxone and azithro  ?  ?Acute Metabolic Encephalopathy due to above, improved ?-CT Head negative for acute intracranial abnormality ?-Urine drug screen positive for benzodiazepines, cannabinoids, opiates, tricyclic's ?  ?AKI likely prerenal int he setting of hypotension ~ IMPROVING ?--Cr peaked at 1.48.  improved to 0.87. ?  ?Mild  Thrombocytopenia, likely in setting of Sepsis ~ RESOLVED ?  ?Hyperglycemia ?--likely stress response and having received IV solumedrol ?-Hemoglobin A1c is 5.1 ?--d/c'ed BG checks and SSI ? ?HTN ?--hypotensive initially after sedation given for intubation.  BP now becoming elevated. ?Plan: ?--cont home Lisinopril ?--IV hydralazine PRN ? ?Acute urinary retention ?--no prior hx.  Foley removed on 3/18, and pt needed 2 I/O cath.  Pt believes he will void better when he can be up and walk around, and would like to avoid foley placement. ?Plan: ?--monitor retention, and I/O cath PRN for post-void >500 ml. ?  ? ?DVT prophylaxis: Lovenox SQ ?Code Status: Full code  ?Family Communication:  ?Level of care: Med-Surg ?Dispo:   ?The patient is from: home ?Anticipated d/c is to: home ?Anticipated d/c date is: tomorrow ? ? ?Subjective and Interval History:  ?Pt reported breathing better, now off O2.  Better oral intake.   ? ?Had urinary retention and needed I/O cath x2. ? ? ?Objective: ?Vitals:  ? 07/10/21 0800 07/10/21 0900 07/10/21 0934 07/10/21 1032  ?BP:   (!) 143/88 (!) 144/93  ?Pulse: 87 73 91 78  ?Resp: (!) '26 17 15 18  '$ ?Temp:   98.2 ?F (36.8 ?C) 98.3 ?F (36.8 ?C)  ?TempSrc:   Oral Oral  ?SpO2: (!) 87% 94% 96% 96%  ?Weight:      ?Height:      ? ? ?Intake/Output Summary (Last 24 hours) at 07/10/2021 1257 ?Last data filed at 07/10/2021 0700 ?Gross per 24 hour  ?Intake 580 ml  ?Output 1329 ml  ?Net -749 ml  ? ?Filed Weights  ? 07/08/21 0300 07/09/21 0418 07/10/21 0433  ?  Weight: 79.7 kg 77.4 kg 77.2 kg  ? ? ?Examination:  ? ?Constitutional: NAD, AAOx3 ?HEENT: conjunctivae and lids normal, EOMI ?CV: No cyanosis.   ?RESP: normal respiratory effort, on RA ?Extremities: No effusions, edema in BLE ?SKIN: warm, dry ?Neuro: II - XII grossly intact.   ?Psych: Normal mood and affect.  Appropriate judgement and reason ? ? ?Data Reviewed: I have personally reviewed following labs and imaging studies ? ?CBC: ?Recent Labs  ?Lab 07/06/21 ?0334  07/07/21 ?0240 07/08/21 ?0207 07/09/21 ?9735 07/10/21 ?0410  ?WBC 13.6* 9.9 9.5 7.7 7.8  ?NEUTROABS 12.3* 8.8* 8.0* 5.8  --   ?HGB 13.3 12.0* 11.6* 12.0* 11.9*  ?HCT 41.7 38.0* 35.3* 37.5* 37.4*  ?MCV 97.4 98.4 96.2 97.2 96.9  ?PLT 216 146* 152 148* 160  ? ?Basic Metabolic Panel: ?Recent Labs  ?Lab 07/06/21 ?0334 07/06/21 ?1049 07/07/21 ?0344 07/08/21 ?0207 07/09/21 ?3299 07/10/21 ?0410  ?NA 139  --  139 139 143 140  ?K 5.5* 4.0 4.1 4.9 3.6 4.0  ?CL 106  --  102 105 106 106  ?CO2 26  --  '25 29 29 27  '$ ?GLUCOSE 208*  --  171* 136* 152* 99  ?BUN 14  --  19 27* 31* 24*  ?CREATININE 1.48*  --  1.20 1.05 0.87 0.82  ?CALCIUM 9.0  --  8.8* 8.5* 8.7* 8.5*  ?MG 2.3  --  2.3  --  2.5* 2.8*  ?PHOS 2.7  --  2.9 3.4 2.5  --   ? ?GFR: ?Estimated Creatinine Clearance: 100.2 mL/min (by C-G formula based on SCr of 0.82 mg/dL). ?Liver Function Tests: ?Recent Labs  ?Lab 07/05/21 ?2133 07/06/21 ?2426 07/07/21 ?8341 07/08/21 ?0207 07/09/21 ?9622  ?AST 14* 12* 32 29 40  ?ALT '14 12 14 14 '$ 34  ?ALKPHOS 179* 159* 127* 100 117  ?BILITOT 0.7 0.6 0.3 0.4 0.6  ?PROT 7.8 7.1 6.8 6.0* 6.2*  ?ALBUMIN 4.0 3.6 3.3* 3.2* 3.4*  ? ?No results for input(s): LIPASE, AMYLASE in the last 168 hours. ?No results for input(s): AMMONIA in the last 168 hours. ?Coagulation Profile: ?No results for input(s): INR, PROTIME in the last 168 hours. ?Cardiac Enzymes: ?No results for input(s): CKTOTAL, CKMB, CKMBINDEX, TROPONINI in the last 168 hours. ?BNP (last 3 results) ?No results for input(s): PROBNP in the last 8760 hours. ?HbA1C: ?No results for input(s): HGBA1C in the last 72 hours. ?CBG: ?Recent Labs  ?Lab 07/08/21 ?1945 07/08/21 ?2345 07/09/21 ?0328 07/09/21 ?2979 07/09/21 ?1107  ?GLUCAP 101* 102* 97 106* 118*  ? ?Lipid Profile: ?No results for input(s): CHOL, HDL, LDLCALC, TRIG, CHOLHDL, LDLDIRECT in the last 72 hours. ?Thyroid Function Tests: ?No results for input(s): TSH, T4TOTAL, FREET4, T3FREE, THYROIDAB in the last 72 hours. ?Anemia Panel: ?Recent Labs  ?   07/08/21 ?0207 07/09/21 ?8921  ?FERRITIN 269 300  ? ?Sepsis Labs: ?Recent Labs  ?Lab 07/05/21 ?2358 07/06/21 ?0334 07/06/21 ?1941  ?PROCALCITON 0.73  --   --   ?LATICACIDVEN  --  1.1 2.2*  ? ? ?Recent Results (from the past 240 hour(s))  ?Culture, blood (routine x 2)     Status: None  ? Collection Time: 07/05/21  9:23 PM  ? Specimen: BLOOD  ?Result Value Ref Range Status  ? Specimen Description BLOOD RIGHT HAND  Final  ? Special Requests   Final  ?  BOTTLES DRAWN AEROBIC AND ANAEROBIC Blood Culture results may not be optimal due to an excessive volume of blood received in culture bottles  ? Culture  Final  ?  NO GROWTH 5 DAYS ?Performed at [facility], [address] [place], [place] [address] ?  ? Report Status 07/10/2021 FINAL  Final  ?Culture, blood (routine x 2)     Status: None  ? Collection Time: 07/05/21  9:23 PM  ? Specimen: BLOOD  ?Result Value Ref Range Status  ? Specimen Description BLOOD LEFT FOREARM  Final  ? Special Requests   Final  ?  BOTTLES DRAWN AEROBIC AND ANAEROBIC Blood Culture results may not be optimal due to an excessive volume of blood received in culture bottles  ? Culture   Final  ?  NO GROWTH 5 DAYS ?Performed at [facility], [address] [place], [place] [address] ?  ? Report Status 07/10/2021 FINAL  Final  ?Resp Panel by RT-PCR (Flu A&B, Covid) Nasopharyngeal Swab     Status: None  ? Collection Time: 07/05/21 10:09 PM  ? Specimen: Nasopharyngeal Swab; Nasopharyngeal(NP) swabs in vial transport medium  ?Result Value Ref Range Status  ? SARS Coronavirus 2 by RT PCR NEGATIVE NEGATIVE Final  ?  Comment: (NOTE) ?SARS-CoV-2 target nucleic acids are NOT DETECTED. ? ?The SARS-CoV-2 RNA is generally detectable in upper respiratory ?specimens during the acute phase of infection. The lowest ?concentration of SARS-CoV-2 viral copies this assay can detect is ?138 copies/mL. A negative result does not preclude SARS-Cov-2 ?infection and should not be used as the sole basis  for treatment or ?other patient management decisions. A negative result may occur with  ?improper specimen collection/handling, submission of specimen other ?than nasopharyngeal swab, presence of viral mutatio

## 2021-07-11 LAB — CBC
HCT: 36.7 % — ABNORMAL LOW (ref 39.0–52.0)
Hemoglobin: 11.9 g/dL — ABNORMAL LOW (ref 13.0–17.0)
MCH: 31.1 pg (ref 26.0–34.0)
MCHC: 32.4 g/dL (ref 30.0–36.0)
MCV: 95.8 fL (ref 80.0–100.0)
Platelets: 160 10*3/uL (ref 150–400)
RBC: 3.83 MIL/uL — ABNORMAL LOW (ref 4.22–5.81)
RDW: 13.5 % (ref 11.5–15.5)
WBC: 8 10*3/uL (ref 4.0–10.5)
nRBC: 0 % (ref 0.0–0.2)

## 2021-07-11 LAB — BASIC METABOLIC PANEL
Anion gap: 8 (ref 5–15)
BUN: 22 mg/dL — ABNORMAL HIGH (ref 6–20)
CO2: 26 mmol/L (ref 22–32)
Calcium: 8.6 mg/dL — ABNORMAL LOW (ref 8.9–10.3)
Chloride: 108 mmol/L (ref 98–111)
Creatinine, Ser: 0.87 mg/dL (ref 0.61–1.24)
GFR, Estimated: >60 mL/min (ref >60)
Glucose, Bld: 85 mg/dL (ref 70–99)
Potassium: 3.4 mmol/L — ABNORMAL LOW (ref 3.5–5.1)
Sodium: 142 mmol/L (ref 135–145)

## 2021-07-11 LAB — MAGNESIUM: Magnesium: 2.5 mg/dL — ABNORMAL HIGH (ref 1.7–2.4)

## 2021-07-11 MED ORDER — POTASSIUM CHLORIDE CRYS ER 20 MEQ PO TBCR
40.0000 meq | EXTENDED_RELEASE_TABLET | Freq: Once | ORAL | Status: AC
Start: 2021-07-11 — End: 2021-07-11
  Administered 2021-07-11: 40 meq via ORAL
  Filled 2021-07-11: qty 2

## 2021-07-11 MED ORDER — PANTOPRAZOLE SODIUM 40 MG PO TBEC: 40.0000 mg | DELAYED_RELEASE_TABLET | Freq: Every day | ORAL | 2 refills | Status: DC

## 2021-07-11 MED ORDER — TIZANIDINE HCL 4 MG PO TABS: 4.0000 mg | ORAL_TABLET | Freq: Three times a day (TID) | ORAL | 0 refills | Status: DC | PRN

## 2021-07-11 MED ORDER — METHOCARBAMOL 500 MG PO TABS: 500.0000 mg | ORAL_TABLET | Freq: Three times a day (TID) | ORAL | Status: DC | PRN

## 2021-07-11 NOTE — Discharge Summary (Signed)
? ?Physician Discharge Summary ? ? ?Henry Owens  male DOB: 06/18/1960  ?WIO:[id number] ? ?PCP: Albina Billet, MD ? ?Admit date: 07/05/2021 ?Discharge date: 07/11/2021 ? ?Admitted From: home ?Disposition:  home ?Wife updated at bedside prior to discharge. ? ?Home Health: Yes ?CODE STATUS: Full code ? ?Hospital Course:  ?For full details, please see H&P, progress notes, consult notes and ancillary notes.  ?Briefly,  ?Henry Owens is a 61 y.o  male with significant PMH of MGUS, current smoker 1.5 PPD x 25 years, chronic low bak pain and anxiety who presented to the ED with chief complaints of SOB. ?  ?On EMS arrival, patient was found tobe hypoxic with sats in the 70s on RA therefore was placed on NRB with slight improvement to 85%. He was subsequently placed on CPAP and transported to the ED.  Patient become more somnolent and less responsive to deep physical stimuli requiring intubation for airway protection.  Shortly after intubation, patient's bp dropped following initiation of sedation requiring peripheral Levophwed and IVFs bolus. PCCM consulted for admission and further management.   ?  ?Pt was extubated on 3/17 and transferred to hospitalist service on 3/18. ?  ?Acute Hypoxic and Hypercapnic Respiratory Failure secondary to Aspiration/Atypical Pneumonia and RLL Mucus plugging ?PMHx: Everyday smoker ?-Initial concerns for COVID, home test + COVID, however inpatient COVID negative  ?-CTA negative for PE ?--intubated in the ED, extubated on 3/17 and weaned down to RA prior to discharge. ?--Bronch on 3/16 found Thick Mucoid secretions and mucus plugs in all segments of the airways. ?--received IV solumedrol during ICU stay, d/c'ed on 3/18. ?--completed a course of ceftriaxone and azithromycin for PNA ?  ?Severe Sepsis secondary to CAP ?--fever 103, tachycardia, tachypnea, with AKI ?--completed a course of ceftriaxone and azithromycin for PNA ?  ?Acute Metabolic Encephalopathy due to above,  resolved ?-CT Head negative for acute intracranial abnormality ?-Urine drug screen positive for benzodiazepines, cannabinoids, opiates, tricyclic's ?  ?AKI likely prerenal int he setting of hypotension ~ resolved ?--Cr peaked at 1.48.  improved to 0.87. ?  ?Mild Thrombocytopenia, likely in setting of Sepsis ~ RESOLVED ?  ?Hyperglycemia ?--likely stress response and having received IV solumedrol ?-Hemoglobin A1c is 5.1 ?--d/c'ed BG checks and SSI ?  ?HTN ?--hypotensive initially after sedation given for intubation.  BP since became elevated. ?--home Lisinopril resumed ?  ?Acute urinary retention, resolved ?--no prior hx.  Foley removed on 3/18, and pt needed 2 I/O cath.  Pt voided better after he was able to get up and walk around. ?  ? ?Discharge Diagnoses:  ?Principal Problem: ?  Acute respiratory failure due to COVID-19 [facility]) ? ? ?30 Day Unplanned Readmission Risk Score   ? ?Flowsheet Row ED to Hosp-Admission (Current) from 07/05/2021 in [facility]  ?30 Day Unplanned Readmission Risk Score (%) 16.73 Filed at 07/11/2021 0801  ? ?  ? ? This score is the patient's risk of an unplanned readmission within 30 days of being discharged (0 -100%). The score is based on dignosis, age, lab data, medications, orders, and past utilization.   ?Low:  0-14.9   Medium: 15-21.9   High: 22-29.9   Extreme: 30 and above ? ?  ? ?  ? ? ?Discharge Instructions: ? ?Allergies as of 07/11/2021   ? ?   Reactions  ? Azithromycin Rash  ? Penicillin G Rash  ? Has patient had a PCN reaction causing immediate rash, facial/tongue/throat swelling, SOB or lightheadedness with hypotension:  Yes ?Has patient had a PCN reaction causing severe rash involving mucus membranes or skin necrosis: No ?Has patient had a PCN reaction that required hospitalization: No ?Has patient had a PCN reaction occurring within the last 10 years: No ?If all of the above answers are "NO", then may proceed with Cephalosporin use.  ? ?  ? ?   ?Medication List  ?  ? ?STOP taking these medications   ? ?ALPRAZolam 1 MG tablet ?Commonly known as: Duanne Moron ?  ?oxyCODONE-acetaminophen 10-325 MG tablet ?Commonly known as: PERCOCET ?  ?Paxlovid (300/100) 20 x 150 MG & 10 x '100MG'$  Tbpk ?Generic drug: nirmatrelvir & ritonavir ?  ? ?  ? ?TAKE these medications   ? ?docusate sodium 50 MG capsule ?Commonly known as: COLACE ?Take 50 mg by mouth 2 (two) times daily. ?  ?fluticasone 50 MCG/ACT nasal spray ?Commonly known as: FLONASE ?Place 1 spray into both nostrils daily as needed for allergies or rhinitis. ?  ?gabapentin 100 MG capsule ?Commonly known as: NEURONTIN ?Take 2 capsules by mouth 3 (three) times daily. ?  ?lisinopril 10 MG tablet ?Commonly known as: ZESTRIL ?Take 10 mg by mouth daily. ?  ?methocarbamol 500 MG tablet ?Commonly known as: ROBAXIN ?Take 1 tablet (500 mg total) by mouth 3 (three) times daily as needed for muscle spasms. Home med. ?What changed:  ?when to take this ?reasons to take this ?additional instructions ?  ?pantoprazole 40 MG tablet ?Commonly known as: Protonix ?Take 1 tablet (40 mg total) by mouth daily. ?  ?QC TUMERIC COMPLEX PO ?Take 1 tablet by mouth daily. ?  ?sertraline 100 MG tablet ?Commonly known as: ZOLOFT ?Take 150 mg by mouth daily. ?  ?tiZANidine 4 MG tablet ?Commonly known as: ZANAFLEX ?Take 1 tablet (4 mg total) by mouth 3 (three) times daily as needed for muscle spasms. Home med. ?What changed:  ?when to take this ?reasons to take this ?additional instructions ?  ?[place] OP ?Place 1 drop into both eyes daily as needed (allergies). ?  ?VITAMIN B 12 PO ?Take 1 tablet by mouth daily. ?  ? ?  ? ?  ?  ? ? ?  ?Durable Medical Equipment  ?(From admission, onward)  ?  ? ? ?  ? ?  Start     Ordered  ? 07/10/21 1707  For home use only DME Tub bench  Once       ? 07/10/21 1706  ? 07/10/21 1707  For home use only DME Walker rolling  Once       ?Question Answer Comment  ?Walker: With 5 Inch Wheels   ?Patient needs a walker to treat with  the following condition Weakness   ?  ? 07/10/21 1706  ? 07/10/21 1134  For home use only DME 3 n 1  Once       ? 07/10/21 1134  ? ?  ?  ? ?  ? ? ? Follow-up Information   ? ? Albina Billet, MD Follow up in 1 week(s).   ?Specialty: Internal Medicine ?Contact information: ?[address]   ?[place] [address] ?[phone] ? ? ?  ?  ? ?  ?  ? ?  ? ? ?Allergies  ?Allergen Reactions  ? Azithromycin Rash  ? Penicillin G Rash  ?  Has patient had a PCN reaction causing immediate rash, facial/tongue/throat swelling, SOB or lightheadedness with hypotension: Yes ?Has patient had a PCN reaction causing severe rash involving mucus membranes or skin necrosis:  No ?Has patient had a PCN reaction that required hospitalization: No ?Has patient had a PCN reaction occurring within the last 10 years: No ?If all of the above answers are "NO", then may proceed with Cephalosporin use. ?  ? ? ? ?The results of significant diagnostics from this hospitalization (including imaging, microbiology, ancillary and laboratory) are listed below for reference.  ? ?Consultations: ? ? ?Procedures/Studies: ?DG Abd 1 View ? ?Result Date: 07/06/2021 ?CLINICAL DATA:  OG tube placement. EXAM: ABDOMEN - 1 VIEW COMPARISON:  None. FINDINGS: An enteric tube terminates in the left upper abdomen with tip and side hole overlying the proximal to mid stomach. Gas and stool are present in the included portion of the colon which is nondilated. Prior lumbar fusion is noted. IMPRESSION: Enteric tube in the stomach. Electronically Signed   By: Logan Bores M.D.   On: 07/06/2021 10:58  ? ?CT HEAD WO CONTRAST (5MM) ? ?Result Date: 07/05/2021 ?CLINICAL DATA:  Mental status change, unknown cause EXAM: CT HEAD WITHOUT CONTRAST TECHNIQUE: Contiguous axial images were obtained from the base of the skull through the vertex without intravenous contrast. RADIATION DOSE REDUCTION: This exam was performed according to the departmental dose-optimization program which includes  automated exposure control, adjustment of the mA and/or kV according to patient size and/or use of iterative reconstruction technique. COMPARISON:  None. FINDINGS: Brain: No acute intracranial abnormality. Specifi

## 2021-07-11 NOTE — Consult Note (Signed)
PHARMACY CONSULT NOTE ? ?Pharmacy Consult for Electrolyte Monitoring and Replacement  ? ?Recent Labs: ?Potassium (mmol/L)  ?Date Value  ?07/11/2021 3.4 (L)  ? ?Magnesium (mg/dL)  ?Date Value  ?07/11/2021 2.5 (H)  ? ?Calcium (mg/dL)  ?Date Value  ?07/11/2021 8.6 (L)  ? ?Albumin (g/dL)  ?Date Value  ?07/09/2021 3.4 (L)  ? ?Phosphorus (mg/dL)  ?Date Value  ?07/09/2021 2.5  ? ?Sodium (mmol/L)  ?Date Value  ?07/11/2021 142  ? ?Assessment: ?Patient is a 61 y/o M with medical history including chronic low back pain, tobacco use, MGUS, anxiety who is admitted with acute respiratory failure secondary to pneumonia. Patient was intubated 3/14. He remains intubated, sedated, and on mechanical ventilation in the ICU. Pharmacy consulted to assist with electrolyte monitoring and replacement as indicated. ?-home test + COVID, however inpatient COVID negative with normal inflammatory markers ? ?Goal of Therapy:  ?Electrolytes within normal limits ? ?Plan:  ?K: 4>3.4 (Scr stable; UOP 0.6>0.62m/k/h)  ?All other Lytes WNL. No further repletion at this time. ?--Pharmacy will continue to follow w/ daily AM labs. ? ?BShanon BrowBeers ?07/11/2021 8:02 AM  ?

## 2021-07-11 NOTE — TOC Transition Note (Signed)
Transition of Care (TOC) - CM/SW Discharge Note ? ? ?Patient Details  ?Name: Henry Owens ?MRN: [id number] ?Date of Birth: 1960/10/15 ? ?Transition of Care (TOC) CM/SW Contact:  ?Kerin Salen, RN ?Phone Number: ?07/11/2021, 1:14 PM ? ? ?Clinical Narrative: Patient discharged early this morning. TOC unable to secure HHPT/OT, attempted to call patient and wife at home to discuss outpatient services, no answer left voice message to return call.   ? ? ? ?  ?Barriers to Discharge: Continued Medical Work up ? ? ?Patient Goals and CMS Choice ?Patient states their goals for this hospitalization and ongoing recovery are:: Patient intubated and sedated ?  ?  ? ?Discharge Placement ?  ?           ?  ?  ?  ?  ? ?Discharge Plan and Services ?  ?  ?           ?  ?  ?  ?  ?  ?  ?  ?  ?  ?  ? ?Social Determinants of Health (SDOH) Interventions ?  ? ? ?Readmission Risk Interventions ?Readmission Risk Prevention Plan 07/10/2021  ?Transportation Screening Complete  ?PCP or Specialist Appt within 5-7 Days Complete  ?Home Care Screening Complete  ?Medication Review (RN CM) Complete  ?Some recent data might be hidden  ? ? ? ? ? ?

## 2021-09-12 ENCOUNTER — Telehealth: Payer: Self-pay | Admitting: Internal Medicine

## 2021-09-12 NOTE — Telephone Encounter (Signed)
Spoke to patient.  Recent admission in March and seen Dr. Mortimer Fries. He does not follow with our office.  Since discharged he has had intermitted fever and prod cough with green sputum Denied sob, chills or sweats.  He spoke with his pulmonologist at [facility], who recommended IV abx.  Patient is aware that he should follow pulmonary recommendations. He voiced his understanding and had no further questions.  Routing to Dr. Mortimer Fries to make aware.

## 2021-10-09 ENCOUNTER — Inpatient Hospital Stay
Admission: EM | Admit: 2021-10-09 | Discharge: 2021-10-12 | DRG: 917 | Disposition: A | Discharge status: Home / Self Care | Payer: Medicaid Other | Attending: Internal Medicine | Admitting: Internal Medicine

## 2021-10-09 ENCOUNTER — Emergency Department: Payer: Medicaid Other

## 2021-10-09 ENCOUNTER — Inpatient Hospital Stay: Payer: Medicaid Other

## 2021-10-09 DIAGNOSIS — Z7951 Long term (current) use of inhaled steroids: Secondary | ICD-10-CM | POA: Diagnosis not present

## 2021-10-09 DIAGNOSIS — Z880 Allergy status to penicillin: Secondary | ICD-10-CM | POA: Diagnosis not present

## 2021-10-09 DIAGNOSIS — G894 Chronic pain syndrome: Secondary | ICD-10-CM | POA: Diagnosis present

## 2021-10-09 DIAGNOSIS — R4189 Other symptoms and signs involving cognitive functions and awareness: Principal | ICD-10-CM

## 2021-10-09 DIAGNOSIS — I959 Hypotension, unspecified: Secondary | ICD-10-CM | POA: Diagnosis present

## 2021-10-09 DIAGNOSIS — J189 Pneumonia, unspecified organism: Secondary | ICD-10-CM

## 2021-10-09 DIAGNOSIS — J9602 Acute respiratory failure with hypercapnia: Secondary | ICD-10-CM | POA: Diagnosis present

## 2021-10-09 DIAGNOSIS — I248 Other forms of acute ischemic heart disease: Secondary | ICD-10-CM | POA: Diagnosis present

## 2021-10-09 DIAGNOSIS — I428 Other cardiomyopathies: Secondary | ICD-10-CM | POA: Diagnosis not present

## 2021-10-09 DIAGNOSIS — Z881 Allergy status to other antibiotic agents status: Secondary | ICD-10-CM

## 2021-10-09 DIAGNOSIS — J690 Pneumonitis due to inhalation of food and vomit: Secondary | ICD-10-CM | POA: Diagnosis present

## 2021-10-09 DIAGNOSIS — R1011 Right upper quadrant pain: Secondary | ICD-10-CM | POA: Diagnosis not present

## 2021-10-09 DIAGNOSIS — T40604A Poisoning by unspecified narcotics, undetermined, initial encounter: Secondary | ICD-10-CM | POA: Diagnosis not present

## 2021-10-09 DIAGNOSIS — T40601A Poisoning by unspecified narcotics, accidental (unintentional), initial encounter: Secondary | ICD-10-CM | POA: Diagnosis present

## 2021-10-09 DIAGNOSIS — G8929 Other chronic pain: Secondary | ICD-10-CM

## 2021-10-09 DIAGNOSIS — K5900 Constipation, unspecified: Secondary | ICD-10-CM | POA: Diagnosis present

## 2021-10-09 DIAGNOSIS — F419 Anxiety disorder, unspecified: Secondary | ICD-10-CM | POA: Diagnosis present

## 2021-10-09 DIAGNOSIS — F1120 Opioid dependence, uncomplicated: Secondary | ICD-10-CM | POA: Diagnosis present

## 2021-10-09 DIAGNOSIS — B961 Klebsiella pneumoniae [K. pneumoniae] as the cause of diseases classified elsewhere: Secondary | ICD-10-CM | POA: Diagnosis present

## 2021-10-09 DIAGNOSIS — Z79899 Other long term (current) drug therapy: Secondary | ICD-10-CM

## 2021-10-09 DIAGNOSIS — E874 Mixed disorder of acid-base balance: Secondary | ICD-10-CM | POA: Diagnosis present

## 2021-10-09 DIAGNOSIS — J9601 Acute respiratory failure with hypoxia: Secondary | ICD-10-CM | POA: Diagnosis present

## 2021-10-09 DIAGNOSIS — G928 Other toxic encephalopathy: Secondary | ICD-10-CM | POA: Diagnosis present

## 2021-10-09 DIAGNOSIS — F32A Depression, unspecified: Secondary | ICD-10-CM | POA: Diagnosis present

## 2021-10-09 DIAGNOSIS — R778 Other specified abnormalities of plasma proteins: Secondary | ICD-10-CM

## 2021-10-09 DIAGNOSIS — E875 Hyperkalemia: Secondary | ICD-10-CM | POA: Diagnosis present

## 2021-10-09 DIAGNOSIS — N179 Acute kidney failure, unspecified: Secondary | ICD-10-CM | POA: Diagnosis present

## 2021-10-09 DIAGNOSIS — F17210 Nicotine dependence, cigarettes, uncomplicated: Secondary | ICD-10-CM | POA: Diagnosis present

## 2021-10-09 DIAGNOSIS — M1990 Unspecified osteoarthritis, unspecified site: Secondary | ICD-10-CM | POA: Diagnosis present

## 2021-10-09 DIAGNOSIS — T40601D Poisoning by unspecified narcotics, accidental (unintentional), subsequent encounter: Secondary | ICD-10-CM | POA: Diagnosis not present

## 2021-10-09 DIAGNOSIS — R7989 Other specified abnormal findings of blood chemistry: Secondary | ICD-10-CM

## 2021-10-09 LAB — BLOOD GAS, ARTERIAL
Acid-base deficit: 8.6 mmol/L — ABNORMAL HIGH (ref 0.0–2.0)
Acid-base deficit: 6.8 mmol/L — ABNORMAL HIGH (ref 0.0–2.0)
Bicarbonate: 20.4 mmol/L (ref 20.0–28.0)
Bicarbonate: 20.2 mmol/L (ref 20.0–28.0)
FIO2: 40 %
FIO2: 40 %
MECHVT: 500 mL
MECHVT: 570 mL
Mechanical Rate: 16
O2 Saturation: 97.4 %
O2 Saturation: 99.8 %
PEEP: 5 cmH2O
PEEP: 5 cmH2O
Patient temperature: 37
Patient temperature: 37
RATE: 16 resp/min
pCO2 arterial: 56 mmHg — ABNORMAL HIGH (ref 32–48)
pCO2 arterial: 45 mmHg (ref 32–48)
pH, Arterial: 7.17 — CL (ref 7.35–7.45)
pH, Arterial: 7.26 — ABNORMAL LOW (ref 7.35–7.45)
pO2, Arterial: 80 mmHg — ABNORMAL LOW (ref 83–108)
pO2, Arterial: 121 mmHg — ABNORMAL HIGH (ref 83–108)

## 2021-10-09 LAB — BASIC METABOLIC PANEL
Anion gap: 12 (ref 5–15)
BUN: 19 mg/dL (ref 6–20)
CO2: 24 mmol/L (ref 22–32)
Calcium: 8.6 mg/dL — ABNORMAL LOW (ref 8.9–10.3)
Chloride: 102 mmol/L (ref 98–111)
Creatinine, Ser: 2.44 mg/dL — ABNORMAL HIGH (ref 0.61–1.24)
GFR, Estimated: 30 mL/min — ABNORMAL LOW (ref >60)
Glucose, Bld: 139 mg/dL — ABNORMAL HIGH (ref 70–99)
Potassium: 4.8 mmol/L (ref 3.5–5.1)
Sodium: 138 mmol/L (ref 135–145)

## 2021-10-09 LAB — PROCALCITONIN: Procalcitonin: 5.77 ng/mL

## 2021-10-09 LAB — COMPREHENSIVE METABOLIC PANEL
ALT: 28 U/L (ref 0–44)
AST: 78 U/L — ABNORMAL HIGH (ref 15–41)
Albumin: 3.5 g/dL (ref 3.5–5.0)
Alkaline Phosphatase: 175 U/L — ABNORMAL HIGH (ref 38–126)
Anion gap: 9 (ref 5–15)
BUN: 20 mg/dL (ref 6–20)
CO2: 23 mmol/L (ref 22–32)
Calcium: 8.2 mg/dL — ABNORMAL LOW (ref 8.9–10.3)
Chloride: 105 mmol/L (ref 98–111)
Creatinine, Ser: 2.36 mg/dL — ABNORMAL HIGH (ref 0.61–1.24)
GFR, Estimated: 31 mL/min — ABNORMAL LOW (ref >60)
Glucose, Bld: 88 mg/dL (ref 70–99)
Potassium: 5 mmol/L (ref 3.5–5.1)
Sodium: 137 mmol/L (ref 135–145)
Total Bilirubin: 0.7 mg/dL (ref 0.3–1.2)
Total Protein: 6.6 g/dL (ref 6.5–8.1)

## 2021-10-09 LAB — URINALYSIS, ROUTINE W REFLEX MICROSCOPIC
Bilirubin Urine: NEGATIVE
Glucose, UA: NEGATIVE mg/dL
Hgb urine dipstick: NEGATIVE
Ketones, ur: NEGATIVE mg/dL
Leukocytes,Ua: NEGATIVE
Nitrite: NEGATIVE
Protein, ur: NEGATIVE mg/dL
Specific Gravity, Urine: 1.009 (ref 1.005–1.030)
pH: 5 (ref 5.0–8.0)

## 2021-10-09 LAB — URINE DRUG SCREEN, QUALITATIVE (ARMC ONLY)
Amphetamines, Ur Screen: NOT DETECTED
Barbiturates, Ur Screen: NOT DETECTED
Benzodiazepine, Ur Scrn: POSITIVE — AB
Cannabinoid 50 Ng, Ur ~~LOC~~: NOT DETECTED
Cocaine Metabolite,Ur ~~LOC~~: NOT DETECTED
MDMA (Ecstasy)Ur Screen: NOT DETECTED
Methadone Scn, Ur: NOT DETECTED
Opiate, Ur Screen: POSITIVE — AB
Phencyclidine (PCP) Ur S: NOT DETECTED
Tricyclic, Ur Screen: NOT DETECTED

## 2021-10-09 LAB — CBC WITH DIFFERENTIAL/PLATELET
Abs Immature Granulocytes: 0.29 10*3/uL — ABNORMAL HIGH (ref 0.00–0.07)
Basophils Absolute: 0.1 10*3/uL (ref 0.0–0.1)
Basophils Relative: 0 %
Eosinophils Absolute: 0.1 10*3/uL (ref 0.0–0.5)
Eosinophils Relative: 1 %
HCT: 39 % (ref 39.0–52.0)
Hemoglobin: 12 g/dL — ABNORMAL LOW (ref 13.0–17.0)
Immature Granulocytes: 1 %
Lymphocytes Relative: 18 %
Lymphs Abs: 4.4 10*3/uL — ABNORMAL HIGH (ref 0.7–4.0)
MCH: 28.8 pg (ref 26.0–34.0)
MCHC: 30.8 g/dL (ref 30.0–36.0)
MCV: 93.8 fL (ref 80.0–100.0)
Monocytes Absolute: 2.9 10*3/uL — ABNORMAL HIGH (ref 0.1–1.0)
Monocytes Relative: 11 %
Neutro Abs: 17.4 10*3/uL — ABNORMAL HIGH (ref 1.7–7.7)
Neutrophils Relative %: 69 %
Platelets: 318 10*3/uL (ref 150–400)
RBC: 4.16 MIL/uL — ABNORMAL LOW (ref 4.22–5.81)
RDW: 13.7 % (ref 11.5–15.5)
WBC: 25 10*3/uL — ABNORMAL HIGH (ref 4.0–10.5)
nRBC: 0.2 % (ref 0.0–0.2)

## 2021-10-09 LAB — CBC
HCT: 38.1 % — ABNORMAL LOW (ref 39.0–52.0)
Hemoglobin: 12 g/dL — ABNORMAL LOW (ref 13.0–17.0)
MCH: 28.6 pg (ref 26.0–34.0)
MCHC: 31.5 g/dL (ref 30.0–36.0)
MCV: 90.9 fL (ref 80.0–100.0)
Platelets: 138 10*3/uL — ABNORMAL LOW (ref 150–400)
RBC: 4.19 MIL/uL — ABNORMAL LOW (ref 4.22–5.81)
RDW: 13.7 % (ref 11.5–15.5)
WBC: 12 10*3/uL — ABNORMAL HIGH (ref 4.0–10.5)
nRBC: 0 % (ref 0.0–0.2)

## 2021-10-09 LAB — APTT: aPTT: 38 seconds — ABNORMAL HIGH (ref 24–36)

## 2021-10-09 LAB — LACTIC ACID, PLASMA
Lactic Acid, Venous: 4.7 mmol/L (ref 0.5–1.9)
Lactic Acid, Venous: 3 mmol/L (ref 0.5–1.9)
Lactic Acid, Venous: 2.7 mmol/L (ref 0.5–1.9)

## 2021-10-09 LAB — TYPE AND SCREEN
ABO/RH(D): O POS
Antibody Screen: NEGATIVE

## 2021-10-09 LAB — TROPONIN I (HIGH SENSITIVITY)
Troponin I (High Sensitivity): 176 ng/L (ref <18)
Troponin I (High Sensitivity): 331 ng/L (ref <18)
Troponin I (High Sensitivity): 647 ng/L (ref <18)

## 2021-10-09 LAB — PROTIME-INR
INR: 1.3 — ABNORMAL HIGH (ref 0.8–1.2)
Prothrombin Time: 16.2 seconds — ABNORMAL HIGH (ref 11.4–15.2)

## 2021-10-09 LAB — HEPATIC FUNCTION PANEL
ALT: 25 U/L (ref 0–44)
AST: 42 U/L — ABNORMAL HIGH (ref 15–41)
Albumin: 3.8 g/dL (ref 3.5–5.0)
Alkaline Phosphatase: 197 U/L — ABNORMAL HIGH (ref 38–126)
Bilirubin, Direct: <0.1 mg/dL (ref 0.0–0.2)
Total Bilirubin: 0.8 mg/dL (ref 0.3–1.2)
Total Protein: 7.4 g/dL (ref 6.5–8.1)

## 2021-10-09 LAB — GLUCOSE, CAPILLARY
Glucose-Capillary: 83 mg/dL (ref 70–99)
Glucose-Capillary: 100 mg/dL — ABNORMAL HIGH (ref 70–99)

## 2021-10-09 LAB — BRAIN NATRIURETIC PEPTIDE: B Natriuretic Peptide: 200.2 pg/mL — ABNORMAL HIGH (ref 0.0–100.0)

## 2021-10-09 LAB — MRSA NEXT GEN BY PCR, NASAL: MRSA by PCR Next Gen: NOT DETECTED

## 2021-10-09 MED ORDER — PANTOPRAZOLE 2 MG/ML SUSPENSION
40.0000 mg | Freq: Every day | ORAL | Status: DC
Start: 2021-10-09 — End: 2021-10-10
  Administered 2021-10-09: 40 mg
  Filled 2021-10-09: qty 20

## 2021-10-09 MED ORDER — SODIUM CHLORIDE 0.9 % IV BOLUS
1000.0000 mL | Freq: Once | INTRAVENOUS | Status: AC
  Administered 2021-10-09: 1000 mL via INTRAVENOUS

## 2021-10-09 MED ORDER — POLYETHYLENE GLYCOL 3350 17 G PO PACK
17.0000 g | PACK | Freq: Every day | ORAL | Status: DC
Start: 2021-10-09 — End: 2021-10-10
  Administered 2021-10-09: 17 g
  Filled 2021-10-09: qty 1

## 2021-10-09 MED ORDER — SODIUM CHLORIDE 0.9 % IV SOLN: 3.0000 g | Freq: Once | INTRAVENOUS | Status: DC

## 2021-10-09 MED ORDER — PROPOFOL 1000 MG/100ML IV EMUL
5.0000 ug/kg/min | INTRAVENOUS | Status: DC
  Administered 2021-10-09: 5 ug/kg/min via INTRAVENOUS

## 2021-10-09 MED ORDER — ORAL CARE MOUTH RINSE
15.0000 mL | OROMUCOSAL | Status: DC
  Administered 2021-10-09 – 2021-10-10 (×9): 15 mL via OROMUCOSAL

## 2021-10-09 MED ORDER — DOCUSATE SODIUM 50 MG/5ML PO LIQD
100.0000 mg | Freq: Two times a day (BID) | ORAL | Status: DC
  Administered 2021-10-09: 100 mg
  Filled 2021-10-09: qty 10

## 2021-10-09 MED ORDER — NOREPINEPHRINE 4 MG/250ML-% IV SOLN
INTRAVENOUS | Status: AC
  Administered 2021-10-09: 2 ug/min via INTRAVENOUS
  Filled 2021-10-09: qty 250

## 2021-10-09 MED ORDER — SODIUM CHLORIDE 0.9 % IV SOLN: 250.0000 mL | INTRAVENOUS | Status: DC

## 2021-10-09 MED ORDER — SODIUM CHLORIDE 0.9 % IV SOLN
2.0000 g | INTRAVENOUS | Status: DC
  Administered 2021-10-09 – 2021-10-11 (×3): 2 g via INTRAVENOUS
  Filled 2021-10-09 (×3): qty 20

## 2021-10-09 MED ORDER — NITROGLYCERIN 2 % TD OINT
1.0000 [in_us] | TOPICAL_OINTMENT | Freq: Three times a day (TID) | TRANSDERMAL | Status: DC
  Administered 2021-10-10 – 2021-10-11 (×5): 1 [in_us] via TOPICAL
  Filled 2021-10-09 (×5): qty 1

## 2021-10-09 MED ORDER — NALOXONE HCL 2 MG/2ML IJ SOSY
0.4000 mg | PREFILLED_SYRINGE | Freq: Once | INTRAMUSCULAR | Status: AC
  Administered 2021-10-09: 0.4 mg via INTRAVENOUS

## 2021-10-09 MED ORDER — CHLORHEXIDINE GLUCONATE CLOTH 2 % EX PADS
6.0000 | MEDICATED_PAD | Freq: Every day | CUTANEOUS | Status: DC
  Administered 2021-10-11 – 2021-10-12 (×2): 6 via TOPICAL

## 2021-10-09 MED ORDER — FENTANYL 2500MCG IN NS 250ML (10MCG/ML) PREMIX INFUSION
50.0000 ug/h | INTRAVENOUS | Status: DC
  Administered 2021-10-09: 100 ug/h via INTRAVENOUS
  Administered 2021-10-10: 70 ug/h via INTRAVENOUS
  Filled 2021-10-09: qty 250

## 2021-10-09 MED ORDER — FENTANYL CITRATE PF 50 MCG/ML IJ SOSY
50.0000 ug | PREFILLED_SYRINGE | Freq: Once | INTRAMUSCULAR | Status: AC
  Administered 2021-10-09: 50 ug via INTRAVENOUS

## 2021-10-09 MED ORDER — POLYETHYLENE GLYCOL 3350 17 G PO PACK: 17.0000 g | PACK | Freq: Every day | ORAL | Status: DC | PRN

## 2021-10-09 MED ORDER — SODIUM CHLORIDE 0.9 % IV SOLN: INTRAVENOUS | Status: DC

## 2021-10-09 MED ORDER — PHENTOLAMINE MESYLATE 5 MG IJ SOLR
5.0000 mg | Freq: Once | INTRAMUSCULAR | Status: AC
  Administered 2021-10-10: 5 mg via SUBCUTANEOUS
  Filled 2021-10-09: qty 5

## 2021-10-09 MED ORDER — ROCURONIUM BROMIDE 50 MG/5ML IV SOLN
INTRAVENOUS | Status: DC | PRN
  Administered 2021-10-09: 100 mg via INTRAVENOUS

## 2021-10-09 MED ORDER — FENTANYL BOLUS VIA INFUSION
50.0000 ug | INTRAVENOUS | Status: DC | PRN
  Administered 2021-10-09: 100 ug via INTRAVENOUS

## 2021-10-09 MED ORDER — ONDANSETRON HCL 4 MG/2ML IJ SOLN
INTRAMUSCULAR | Status: AC
  Filled 2021-10-09: qty 2

## 2021-10-09 MED ORDER — ONDANSETRON HCL 4 MG/2ML IJ SOLN
4.0000 mg | Freq: Once | INTRAMUSCULAR | Status: AC
  Administered 2021-10-09: 4 mg via INTRAVENOUS

## 2021-10-09 MED ORDER — NOREPINEPHRINE 4 MG/250ML-% IV SOLN
2.0000 ug/min | INTRAVENOUS | Status: DC
  Administered 2021-10-10: 8 ug/min via INTRAVENOUS
  Filled 2021-10-09: qty 250

## 2021-10-09 MED ORDER — HEPARIN SODIUM (PORCINE) 5000 UNIT/ML IJ SOLN
5000.0000 [IU] | Freq: Three times a day (TID) | INTRAMUSCULAR | Status: DC
  Administered 2021-10-09 – 2021-10-12 (×9): 5000 [IU] via SUBCUTANEOUS
  Filled 2021-10-09 (×8): qty 1

## 2021-10-09 MED ORDER — DOCUSATE SODIUM 50 MG/5ML PO LIQD: 100.0000 mg | Freq: Two times a day (BID) | ORAL | Status: DC | PRN

## 2021-10-09 MED ORDER — PROPOFOL 1000 MG/100ML IV EMUL
5.0000 ug/kg/min | INTRAVENOUS | Status: DC
  Administered 2021-10-09 (×2): 60 ug/kg/min via INTRAVENOUS
  Administered 2021-10-09: 5 ug/kg/min via INTRAVENOUS
  Administered 2021-10-10: 30 ug/kg/min via INTRAVENOUS
  Filled 2021-10-09 (×3): qty 100

## 2021-10-09 MED ORDER — MIDAZOLAM HCL 2 MG/2ML IJ SOLN: 2.0000 mg | INTRAMUSCULAR | Status: DC | PRN

## 2021-10-09 MED ORDER — DOCUSATE SODIUM 100 MG PO CAPS
100.0000 mg | ORAL_CAPSULE | Freq: Two times a day (BID) | ORAL | Status: DC | PRN
Start: 2021-10-09 — End: 2021-10-09

## 2021-10-09 MED ORDER — FENTANYL 2500MCG IN NS 250ML (10MCG/ML) PREMIX INFUSION
0.0000 ug/h | INTRAVENOUS | Status: DC
  Administered 2021-10-09: 100 ug/h via INTRAVENOUS

## 2021-10-09 MED ORDER — ORAL CARE MOUTH RINSE: 15.0000 mL | OROMUCOSAL | Status: DC | PRN

## 2021-10-09 NOTE — H&P (Signed)
NAME:  Henry Owens, MRN:  [id number], DOB:  [birth year], LOS: 0 ADMISSION DATE:  10/09/2021, CONSULTATION DATE: October 09, 2021 REFERRING MD: ED physician, CHIEF COMPLAINT: Altered mental status  History of Present Illness:  Patient is not able to provide any history history was obtained by the bedside nurse and previous medical records daughter showed up later.  Patient has chronic pain syndrome he goes to [facility] has been dependent on oxycodone past 2 years his wife had surgery lately she was confused overnight agitated when the daughter got there I did asked her for the pain medication pills container for her mom and assumed that he took some of that medication because he was in pain as well top of his regular medications. He presented to the ED with unresponsiveness and poor respiration myolysis that improved with Narcan but not the breathing never lost pulse he was intubated with ET tube for airway protection and he had a lot of secretions in his upper airways.  Objective   Blood pressure 106/79, pulse 93, temperature (!) 97.3 F (36.3 C), temperature source Axillary, resp. rate 16, height '5\' 10"'$  (1.778 m), weight 82.9 kg, SpO2 96 %.    Vent Mode: PRVC FiO2 (%):  [40 %] 40 % Set Rate:  [16 bmp] 16 bmp Vt Set:  [500 mL-5570 mL] 5570 mL PEEP:  [5 cmH20] 5 cmH20   Intake/Output Summary (Last 24 hours) at 10/09/2021 1629 Last data filed at 10/09/2021 1520 Gross per 24 hour  Intake 1082 ml  Output --  Net 1082 ml   Filed Weights   10/09/21 1300 10/09/21 1510  Weight: 79.6 kg 82.9 kg    Examination: General: Intubated sedated agitated at times  Neuro: Intubated agitated disoriented at times moving upper lower extremities without limitations. HEENT:  atraumatic , no jaundice , dry mucous membranes  Cardiovascular:  Irregular irregular , ESM 2/6 in the aortic area  Lungs:  CTA bilateral , no wheezing or crackles  Abdomen:  Soft lax +BS , no tenderness . Musculoskeletal:  WNL ,  normal pulses  Skin:  No rash    Assessment & Plan:    --Acute respiratory distress associated with hypercapnia and hypoxia most likely due to CNS depression from an intentional narcotic overuse for chronic pain syndrome continue supportive care and ventilator he was not responsive to Narcan.   -- Hypotension and lactic acidosis and leukocytosis or picture consistent with aspiration.  The patient on antibiotic aggressive IV fluid resuscitation and pressors with blood cultures.   -- Acute kidney injury with increased creatinine associated with being down not eating hypotensive septic aggressive IV fluid resuscitation avoid nephrotoxic agents   -- Patient is a full code he will be on DVT prophylaxis critical care time spent the care of this patient 48 minutes.    Labs   CBC: Recent Labs  Lab 10/09/21 1235 10/09/21 1521  WBC 25.0* 12.0*  NEUTROABS 17.4*  --   HGB 12.0* 12.0*  HCT 39.0 38.1*  MCV 93.8 90.9  PLT 318 138*    Basic Metabolic Panel: Recent Labs  Lab 10/09/21 1235 10/09/21 1521  NA 138 137  K 4.8 5.0  CL 102 105  CO2 24 23  GLUCOSE 139* 88  BUN 19 20  CREATININE 2.44* 2.36*  CALCIUM 8.6* 8.2*   GFR: Estimated Creatinine Clearance: 34.4 mL/min (A) (by C-G formula based on SCr of 2.36 mg/dL (H)). Recent Labs  Lab 10/09/21 1235 10/09/21 1521  PROCALCITON 5.77  --  WBC 25.0* 12.0*  LATICACIDVEN 4.7* 3.0*    Liver Function Tests: Recent Labs  Lab 10/09/21 1235 10/09/21 1521  AST 42* 78*  ALT 25 28  ALKPHOS 197* 175*  BILITOT 0.8 0.7  PROT 7.4 6.6  ALBUMIN 3.8 3.5   No results for input(s): "LIPASE", "AMYLASE" in the last 168 hours. No results for input(s): "AMMONIA" in the last 168 hours.  ABG    Component Value Date/Time   PHART 7.17 (LL) 10/09/2021 1315   PCO2ART 56 (H) 10/09/2021 1315   PO2ART 80 (L) 10/09/2021 1315   HCO3 20.4 10/09/2021 1315   ACIDBASEDEF 8.6 (H) 10/09/2021 1315   O2SAT 97.4 10/09/2021 1315     Coagulation  Profile: Recent Labs  Lab 10/09/21 1235  INR 1.3*    Cardiac Enzymes: No results for input(s): "CKTOTAL", "CKMB", "CKMBINDEX", "TROPONINI" in the last 168 hours.  HbA1C: Hgb A1c MFr Bld  Date/Time Value Ref Range Status  07/05/2021 11:58 PM 5.1 4.8 - 5.6 % Final    Comment:    (NOTE)         Prediabetes: 5.7 - 6.4         Diabetes: >6.4         Glycemic control for adults with diabetes: <7.0     CBG: Recent Labs  Lab 10/09/21 1513  GLUCAP 83    Review of Systems:   Unable to obtain due to patient condition.  Past Medical History:  He,  has a past medical history of Anxiety, Arthritis, and Flu (07/2017).   Surgical History:   Past Surgical History:  Procedure Laterality Date   APPENDECTOMY  1975   BACK SURGERY     KNEE ARTHROSCOPY WITH MEDIAL MENISECTOMY Right 08/20/2017   Procedure: KNEE ARTHROSCOPY WITH PARTIAL MEDIAL AND LATERAL MENISECTOMY,PARTIAL SYNOVECTOMY,CHONDROPLASTY, LOOSE BODY REMOVAL;  Surgeon: Lovell Sheehan, MD;  Location: [facility] ORS;  Service: Orthopedics;  Laterality: Right;   [place], 2000     Social History:   reports that he has been smoking cigarettes. He has a 37.50 pack-year smoking history. He has never used smokeless tobacco. He reports that he does not currently use drugs. He reports that he does not drink alcohol.   Family History:  His family history is negative for Bladder Cancer, Prostate cancer, and Hematuria.   Allergies Allergies  Allergen Reactions   Azithromycin Rash   Penicillin G Rash    Has patient had a PCN reaction causing immediate rash, facial/tongue/throat swelling, SOB or lightheadedness with hypotension: Yes Has patient had a PCN reaction causing severe rash involving mucus membranes or skin necrosis: No Has patient had a PCN reaction that required hospitalization: No Has patient had a PCN reaction occurring within the last 10 years: No If all of the above answers are "NO", then may proceed with  Cephalosporin use.      Home Medications  Prior to Admission medications   Medication Sig Start Date End Date Taking? Authorizing Provider  ALPRAZolam Duanne Moron) 0.5 MG tablet Take 0.5 mg by mouth 3 (three) times daily as needed. 10/04/21  Yes [provider]  Cyanocobalamin (VITAMIN B 12 PO) Take 1 tablet by mouth daily.   Yes [provider]  docusate sodium (COLACE) 50 MG capsule Take 50 mg by mouth 2 (two) times daily.   Yes [provider]  fluticasone (FLONASE) 50 MCG/ACT nasal spray Place 1 spray into both nostrils daily as needed for allergies or rhinitis.   Yes [provider]  gabapentin (NEURONTIN) 300 MG capsule Take 300 mg by mouth 3 (three) times daily. 09/30/21  Yes [provider]  lisinopril (ZESTRIL) 10 MG tablet Take 10 mg by mouth daily.   Yes [provider]  pantoprazole (PROTONIX) 40 MG tablet Take 1 tablet (40 mg total) by mouth daily. 07/11/21 10/09/21 Yes Enzo Bi, MD  sertraline (ZOLOFT) 100 MG tablet Take 150 mg by mouth daily.   Yes [provider]  SYMBICORT 80-4.5 MCG/ACT inhaler Inhale into the lungs. 10/03/21  Yes [provider]  Tetrahydrozoline HCl (VISINE OP) Place 1 drop into both eyes daily as needed (allergies).   Yes [provider]  tiZANidine (ZANAFLEX) 4 MG tablet Take 1 tablet (4 mg total) by mouth 3 (three) times daily as needed for muscle spasms. Home med. 07/11/21  Yes Enzo Bi, MD  Turmeric (QC TUMERIC COMPLEX PO) Take 1 tablet by mouth daily.   Yes [provider]  gabapentin (NEURONTIN) 100 MG capsule Take 2 capsules by mouth 3 (three) times daily. Patient not taking: Reported on 10/09/2021 06/10/21   [provider]  methocarbamol (ROBAXIN) 500 MG tablet Take 1 tablet (500 mg total) by mouth 3 (three) times daily as needed for muscle spasms. Home med. Patient not taking: Reported on 10/09/2021 07/11/21   Enzo Bi, MD     Critical care time: Critical care  time spent the care of this patient is 48 minutes.

## 2021-10-09 NOTE — Progress Notes (Signed)
An USGPIV (ultrasound guided PIV) has been placed for short-term vasopressor infusion. A correctly placed ivWatch must be used when administering Vasopressors. Should this treatment be needed beyond 72 hours, central line access should be obtained.  It will be the responsibility of the bedside nurse to follow best practice to prevent extravasations.   ?

## 2021-10-09 NOTE — Progress Notes (Addendum)
An USGPIV (ultrasound guided PIV) has been placed for short-term vasopressor infusion. A correctly placed ivWatch must be used when administering Vasopressors. Should this treatment be needed beyond 72 hours, central line access should be obtained.  It will be the responsibility of the bedside nurse to follow best practice to prevent extravasations.  Phillippa RN at bedside and placed IV Watch.

## 2021-10-09 NOTE — ED Triage Notes (Signed)
6 mg narcan by ems , 2 mg narcan in hospital

## 2021-10-09 NOTE — ED Triage Notes (Signed)
Pt at home , daughter  called ems, pt unresponsive 1500 last seen , narcan given , not improved, pt given narcan upon arrival, rrt called

## 2021-10-09 NOTE — ED Provider Notes (Signed)
[facility] Provider Note    Event Date/Time   First MD Initiated Contact with Patient 10/09/21 1251     (approximate)   History   Altered Mental Status (Unresponsive , narcan on field, )   HPI  Castor Gittleman is a 61 y.o. male who presents to the ED for evaluation of Altered Mental Status (Unresponsive , narcan on field, )   I review [facility] pain clinic visit from 4 days ago.  History of spinal stenosis and chronic lower back pain.  Chronic opiates.  Patient presents to the ED for evaluation of unresponsiveness and poor respirations.  EMS found him with gray color, slow respiratory rate and unresponsive.  Miotic pupils.  His respiratory rate and pupillary size improved with Narcan administration, but he never woke up.  He presents to the ED being bagged by BVM.  Never lost pulses.   We provided 2 mg of IV Narcan on arrival to the ED without changes to his clinical status.  Intubated quickly after arrival.  Further history obtained by the daughter at the bedside when she arrives indicates that he may have been taking his wife's pain medicine that she got after her recent procedure.  He was noted to have slow and sonorous respirations at home for a couple hours as daughter was dealing with the mom who had fallen out of bed.  After she got her situated then noted that he had foamy emesis on his mouth and continued poor respirations, would not wake up so they called 911.  Physical Exam   Triage Vital Signs: ED Triage Vitals [10/09/21 1233]  Enc Vitals Group     BP (!) 144/72     Pulse Rate 93     Resp 18     Temp (!) 96.9 F (36.1 C)     Temp Source Axillary     SpO2 93 %     Weight      Height      Head Circumference      Peak Flow      Pain Score      Pain Loc      Pain Edu?      Excl. in [place]?     Most recent vital signs: Vitals:   10/09/21 1327 10/09/21 1351  BP: 95/65 93/66  Pulse: (!) 106 99  Resp: (!) 21 20  Temp: 98.1 F (36.7 C)    SpO2: 93% 96%    General: Unresponsive.  Snoring with normal respiration rate CV:  Good peripheral perfusion.  Tachycardic and regular Resp:  Normal respiration rate with sonorous respirations, improving with jaw thrust.  Being bagged.  Sats in the low 90s Abd:  No distention.  Soft MSK:  No deformity noted.  No signs of trauma or track marks Neuro:  GCS 3. No clonus or rigidity Other:     ED Results / Procedures / Treatments   Labs (all labs ordered are listed, but only abnormal results are displayed) Labs Reviewed  CBC WITH DIFFERENTIAL/PLATELET - Abnormal; Notable for the following components:      Result Value   WBC 25.0 (*)    RBC 4.16 (*)    Hemoglobin 12.0 (*)    Neutro Abs 17.4 (*)    Lymphs Abs 4.4 (*)    Monocytes Absolute 2.9 (*)    Abs Immature Granulocytes 0.29 (*)    All other components within normal limits  BASIC METABOLIC PANEL - Abnormal; Notable for the following  components:   Glucose, Bld 139 (*)    Creatinine, Ser 2.44 (*)    Calcium 8.6 (*)    GFR, Estimated 30 (*)    All other components within normal limits  APTT - Abnormal; Notable for the following components:   aPTT 38 (*)    All other components within normal limits  PROTIME-INR - Abnormal; Notable for the following components:   Prothrombin Time 16.2 (*)    INR 1.3 (*)    All other components within normal limits  LACTIC ACID, PLASMA - Abnormal; Notable for the following components:   Lactic Acid, Venous 4.7 (*)    All other components within normal limits  BLOOD GAS, ARTERIAL - Abnormal; Notable for the following components:   pH, Arterial 7.17 (*)    pCO2 arterial 56 (*)    pO2, Arterial 80 (*)    Acid-base deficit 8.6 (*)    All other components within normal limits  URINALYSIS, ROUTINE W REFLEX MICROSCOPIC - Abnormal; Notable for the following components:   Color, Urine YELLOW (*)    APPearance HAZY (*)    All other components within normal limits  TROPONIN I (HIGH SENSITIVITY) -  Abnormal; Notable for the following components:   Troponin I (High Sensitivity) 176 (*)    All other components within normal limits  LACTIC ACID, PLASMA  HEPATIC FUNCTION PANEL  PROCALCITONIN  BRAIN NATRIURETIC PEPTIDE  URINE DRUG SCREEN, QUALITATIVE ([facility] ONLY)  TYPE AND SCREEN    EKG Sinus rhythm, rate of 99 bpm.  Normal axis.  QTc 484.  No STEMI.  RADIOLOGY CT head interpreted by me without evidence of acute intracranial pathology 1 view CXR interpreted by me with ETT in good position.  No lobar infiltrates but with increased interstitial prominences and peribronchial cuffing  Official radiology report(s): DG Chest Portable 1 View  Result Date: 10/09/2021 CLINICAL DATA:  61 year old male status post intubation. EXAM: PORTABLE CHEST 1 VIEW COMPARISON:  Chest x-ray 07/08/2021. FINDINGS: An endotracheal tube is in place with tip 3.7 cm above the carina. Nasogastric tube extends into the mid body of the stomach. Lung volumes are slightly low. No confluent consolidative airspace disease. However, there is extensive interstitial prominence and peribronchial cuffing most evident in the mid to lower lungs bilaterally. No pleural effusions. No pneumothorax. No evidence of pulmonary edema. Heart size is normal. Upper mediastinal contours are within normal limits. IMPRESSION: 1. Support apparatus, as above. 2. The appearance of the lungs suggests severe acute bronchitis. Electronically Signed   By: Vinnie Langton M.D.   On: 10/09/2021 13:41   CT HEAD WO CONTRAST (5MM)  Result Date: 10/09/2021 CLINICAL DATA:  61 year old male unresponsive patient. EXAM: CT HEAD WITHOUT CONTRAST TECHNIQUE: Contiguous axial images were obtained from the base of the skull through the vertex without intravenous contrast. RADIATION DOSE REDUCTION: This exam was performed according to the departmental dose-optimization program which includes automated exposure control, adjustment of the mA and/or kV according to patient  size and/or use of iterative reconstruction technique. COMPARISON:  Head CT 07/05/2021. FINDINGS: Brain: No evidence of acute infarction, hemorrhage, hydrocephalus, extra-axial collection or mass lesion/mass effect. Vascular: No hyperdense vessel or unexpected calcification. Skull: Normal. Negative for fracture or focal lesion. Sinuses/Orbits: Small amount of frothy secretions are noted dependently in the right maxillary sinus. Other: Endotracheal and orogastric tubes incompletely imaged. IMPRESSION: 1. No acute intracranial abnormalities. The appearance of the brain is normal for age. Electronically Signed   By: Vinnie Langton M.D.   On:  10/09/2021 13:34    PROCEDURES and INTERVENTIONS:  .1-3 Lead EKG Interpretation  Performed by: Vladimir Crofts, MD Authorized by: Vladimir Crofts, MD     Interpretation: abnormal     ECG rate:  110   ECG rate assessment: tachycardic     Rhythm: sinus tachycardia     Ectopy: none     Conduction: normal   .Critical Care  Performed by: Vladimir Crofts, MD Authorized by: Vladimir Crofts, MD   Critical care provider statement:    Critical care time (minutes):  30   Critical care time was exclusive of:  Separately billable procedures and treating other patients   Critical care was necessary to treat or prevent imminent or life-threatening deterioration of the following conditions:  Respiratory failure and CNS failure or compromise   Critical care was time spent personally by me on the following activities:  Development of treatment plan with patient or surrogate, discussions with consultants, evaluation of patient's response to treatment, examination of patient, ordering and review of laboratory studies, ordering and review of radiographic studies, ordering and performing treatments and interventions, pulse oximetry, re-evaluation of patient's condition and review of old charts Procedure Name: Intubation Date/Time: 10/09/2021 2:00 PM  Performed by: Vladimir Crofts,  MDPre-anesthesia Checklist: Patient identified, Patient being monitored, Emergency Drugs available, Timeout performed and Suction available Oxygen Delivery Method: Non-rebreather mask Preoxygenation: Pre-oxygenation with 100% oxygen Induction Type: Rapid sequence Ventilation: Mask ventilation without difficulty Laryngoscope Size: Glidescope and 3 Tube size: 7.5 mm Number of attempts: 1 Airway Equipment and Method: Rigid stylet Placement Confirmation: ETT inserted through vocal cords under direct vision, CO2 detector and Breath sounds checked- equal and bilateral Secured at: 23 cm Tube secured with: ETT holder      Medications  rocuronium (ZEMURON) injection (100 mg Intravenous Given 10/09/21 1240)  fentaNYL 2567mg in NS 25107m(1053mml) infusion-PREMIX (100 mcg/hr Intravenous New Bag/Given 10/09/21 1247)  propofol (DIPRIVAN) 1000 MG/100ML infusion (5 mcg/kg/min  79.6 kg Intravenous New Bag/Given 10/09/21 1330)  Ampicillin-Sulbactam (UNASYN) 3 g in sodium chloride 0.9 % 100 mL IVPB (has no administration in time range)  ondansetron (ZOFRAN) 4 MG/2ML injection (  Given 10/09/21 1240)  naloxone (NARCAN) injection 0.4 mg (0.4 mg Intravenous Given 10/09/21 1242)  ondansetron (ZOFRAN) injection 4 mg (4 mg Intravenous Given 10/09/21 1244)  sodium chloride 0.9 % bolus 1,000 mL (1,000 mLs Intravenous New Bag/Given 10/09/21 1306)     IMPRESSION / MDM / [place] Bryan[place] COURSE  I reviewed the triage vital signs and the nursing notes.  Differential diagnosis includes, but is not limited to, stroke, intracranial hemorrhage, opiate overdose, polysubstance abuse, trauma  {Patient presents with symptoms of an acute illness or injury that is potentially life-threatening.  [age]ar old male with chronic pain syndrome presents unresponsive requiring intubation and ICU admission.  He presents hemodynamically stable with a GCS of 3 and not improving with any Narcan.  Despite his unresponsive state,  he does not really fit the picture of opiate overdose by the time I see him, his pupils are not miotic and he has normal/fast respiration rate.  Does not respond to any Narcan here so he is intubated quickly for airway protection and poor mental status.  He has quite a bit of frothy thin sputum coming from the airway.  Uncertain if this is from pulmonary edema or aspiration contents.  We will cover for aspiration pneumonia pneumonitis with some Unasyn.  Blood work with metabolic and respiratory acidosis.  Leukocytosis is noted.  Elevated creatinine.  CT head without evidence of ICH and his CXR is fairly clear now but anticipate will worsen.  I consulted with ICU who agrees to admit.  Clinical Course as of 10/09/21 1359  Sun Oct 09, 2021  1321 reassessed [DS]  [place] and discussed with daughter at the bedside.  We discussed presentation, poor prognosis and my concerns.  Discussed ICU admission. [DS]  1478 I consult ICU physician who agrees to admit. Dr. Milon Dikes [DS]    Clinical Course User Index [DS] Vladimir Crofts, MD     FINAL CLINICAL IMPRESSION(S) / ED DIAGNOSES   Final diagnoses:  None     Rx / DC Orders   ED Discharge Orders     None        Note:  This document was prepared using Dragon voice recognition software and may include unintentional dictation errors.   Vladimir Crofts, MD 10/09/21 [phone]

## 2021-10-10 ENCOUNTER — Inpatient Hospital Stay: Payer: Medicaid Other

## 2021-10-10 ENCOUNTER — Inpatient Hospital Stay (HOSPITAL_COMMUNITY)
Admit: 2021-10-10 | Discharge: 2021-10-10 | Disposition: A | Discharge status: Home / Self Care | Payer: Medicaid Other | Attending: Pulmonary Disease | Admitting: Pulmonary Disease

## 2021-10-10 ENCOUNTER — Encounter: Payer: Self-pay | Admitting: Internal Medicine

## 2021-10-10 DIAGNOSIS — J9602 Acute respiratory failure with hypercapnia: Secondary | ICD-10-CM | POA: Diagnosis not present

## 2021-10-10 DIAGNOSIS — J9601 Acute respiratory failure with hypoxia: Secondary | ICD-10-CM | POA: Diagnosis not present

## 2021-10-10 DIAGNOSIS — I428 Other cardiomyopathies: Secondary | ICD-10-CM

## 2021-10-10 LAB — COMPREHENSIVE METABOLIC PANEL
ALT: 27 U/L (ref 0–44)
AST: 51 U/L — ABNORMAL HIGH (ref 15–41)
Albumin: 2.9 g/dL — ABNORMAL LOW (ref 3.5–5.0)
Alkaline Phosphatase: 136 U/L — ABNORMAL HIGH (ref 38–126)
Anion gap: 9 (ref 5–15)
BUN: 25 mg/dL — ABNORMAL HIGH (ref 6–20)
CO2: 19 mmol/L — ABNORMAL LOW (ref 22–32)
Calcium: 8.2 mg/dL — ABNORMAL LOW (ref 8.9–10.3)
Chloride: 108 mmol/L (ref 98–111)
Creatinine, Ser: 2.07 mg/dL — ABNORMAL HIGH (ref 0.61–1.24)
GFR, Estimated: 36 mL/min — ABNORMAL LOW (ref >60)
Glucose, Bld: 116 mg/dL — ABNORMAL HIGH (ref 70–99)
Potassium: 6.8 mmol/L (ref 3.5–5.1)
Sodium: 136 mmol/L (ref 135–145)
Total Bilirubin: 0.6 mg/dL (ref 0.3–1.2)
Total Protein: 5.5 g/dL — ABNORMAL LOW (ref 6.5–8.1)

## 2021-10-10 LAB — CBC WITH DIFFERENTIAL/PLATELET
Abs Immature Granulocytes: 0.05 10*3/uL (ref 0.00–0.07)
Basophils Absolute: 0 10*3/uL (ref 0.0–0.1)
Basophils Relative: 0 %
Eosinophils Absolute: 0 10*3/uL (ref 0.0–0.5)
Eosinophils Relative: 0 %
HCT: 35.9 % — ABNORMAL LOW (ref 39.0–52.0)
Hemoglobin: 11.6 g/dL — ABNORMAL LOW (ref 13.0–17.0)
Immature Granulocytes: 1 %
Lymphocytes Relative: 13 %
Lymphs Abs: 1.3 10*3/uL (ref 0.7–4.0)
MCH: 28.5 pg (ref 26.0–34.0)
MCHC: 32.3 g/dL (ref 30.0–36.0)
MCV: 88.2 fL (ref 80.0–100.0)
Monocytes Absolute: 0.5 10*3/uL (ref 0.1–1.0)
Monocytes Relative: 5 %
Neutro Abs: 8.1 10*3/uL — ABNORMAL HIGH (ref 1.7–7.7)
Neutrophils Relative %: 81 %
Platelets: 172 10*3/uL (ref 150–400)
RBC: 4.07 MIL/uL — ABNORMAL LOW (ref 4.22–5.81)
RDW: 13.8 % (ref 11.5–15.5)
WBC: 10 10*3/uL (ref 4.0–10.5)
nRBC: 0 % (ref 0.0–0.2)

## 2021-10-10 LAB — GLUCOSE, CAPILLARY
Glucose-Capillary: 104 mg/dL — ABNORMAL HIGH (ref 70–99)
Glucose-Capillary: 110 mg/dL — ABNORMAL HIGH (ref 70–99)
Glucose-Capillary: 116 mg/dL — ABNORMAL HIGH (ref 70–99)
Glucose-Capillary: 116 mg/dL — ABNORMAL HIGH (ref 70–99)
Glucose-Capillary: 117 mg/dL — ABNORMAL HIGH (ref 70–99)
Glucose-Capillary: 97 mg/dL (ref 70–99)
Glucose-Capillary: 98 mg/dL (ref 70–99)

## 2021-10-10 LAB — TROPONIN I (HIGH SENSITIVITY)
Troponin I (High Sensitivity): 1033 ng/L (ref <18)
Troponin I (High Sensitivity): 945 ng/L (ref <18)

## 2021-10-10 LAB — PROCALCITONIN
Procalcitonin: 40.16 ng/mL
Procalcitonin: 31.48 ng/mL

## 2021-10-10 LAB — MAGNESIUM: Magnesium: 2 mg/dL (ref 1.7–2.4)

## 2021-10-10 LAB — POTASSIUM
Potassium: 5 mmol/L (ref 3.5–5.1)
Potassium: 5 mmol/L (ref 3.5–5.1)

## 2021-10-10 LAB — LACTIC ACID, PLASMA: Lactic Acid, Venous: 1.5 mmol/L (ref 0.5–1.9)

## 2021-10-10 LAB — TRIGLYCERIDES: Triglycerides: 322 mg/dL — ABNORMAL HIGH (ref <150)

## 2021-10-10 LAB — PHOSPHORUS: Phosphorus: 4.4 mg/dL (ref 2.5–4.6)

## 2021-10-10 MED ORDER — SODIUM ZIRCONIUM CYCLOSILICATE 5 G PO PACK
10.0000 g | PACK | Freq: Once | ORAL | Status: AC
Start: 2021-10-10 — End: 2021-10-10
  Administered 2021-10-10: 10 g via ORAL
  Filled 2021-10-10: qty 2

## 2021-10-10 MED ORDER — FLUTICASONE PROPIONATE 50 MCG/ACT NA SUSP: 1.0000 | Freq: Every day | NASAL | Status: DC | PRN

## 2021-10-10 MED ORDER — POLYETHYLENE GLYCOL 3350 17 G PO PACK: 17.0000 g | PACK | Freq: Every day | ORAL | Status: DC | PRN

## 2021-10-10 MED ORDER — CALCIUM GLUCONATE-NACL 1-0.675 GM/50ML-% IV SOLN
1.0000 g | Freq: Once | INTRAVENOUS | Status: AC
Start: 2021-10-10 — End: 2021-10-10
  Administered 2021-10-10: 1000 mg via INTRAVENOUS
  Filled 2021-10-10: qty 50

## 2021-10-10 MED ORDER — OXYCODONE-ACETAMINOPHEN 5-325 MG PO TABS
1.0000 | ORAL_TABLET | Freq: Three times a day (TID) | ORAL | Status: DC | PRN
  Administered 2021-10-10 – 2021-10-12 (×4): 1 via ORAL
  Filled 2021-10-10 (×5): qty 1

## 2021-10-10 MED ORDER — PANTOPRAZOLE SODIUM 40 MG PO TBEC
40.0000 mg | DELAYED_RELEASE_TABLET | Freq: Every day | ORAL | Status: DC
  Administered 2021-10-10 – 2021-10-12 (×3): 40 mg via ORAL
  Filled 2021-10-10 (×3): qty 1

## 2021-10-10 MED ORDER — INSULIN ASPART 100 UNIT/ML IV SOLN
10.0000 [IU] | Freq: Once | INTRAVENOUS | Status: AC
  Administered 2021-10-10: 10 [IU] via INTRAVENOUS
  Filled 2021-10-10: qty 0.1

## 2021-10-10 MED ORDER — VITAMIN B-12 1000 MCG PO TABS
500.0000 ug | ORAL_TABLET | Freq: Every day | ORAL | Status: DC
  Administered 2021-10-10 – 2021-10-12 (×3): 500 ug via ORAL
  Filled 2021-10-10 (×3): qty 1

## 2021-10-10 MED ORDER — DEXTROSE 50 % IV SOLN
1.0000 | Freq: Once | INTRAVENOUS | Status: AC
  Administered 2021-10-10: 50 mL via INTRAVENOUS
  Filled 2021-10-10: qty 50

## 2021-10-10 MED ORDER — GABAPENTIN 300 MG PO CAPS
300.0000 mg | ORAL_CAPSULE | Freq: Three times a day (TID) | ORAL | Status: DC
  Administered 2021-10-10 – 2021-10-12 (×7): 300 mg via ORAL
  Filled 2021-10-10 (×7): qty 1

## 2021-10-10 MED ORDER — GABAPENTIN 300 MG PO CAPS
300.0000 mg | ORAL_CAPSULE | Freq: Three times a day (TID) | ORAL | Status: DC
Start: 2021-10-10 — End: 2021-10-10

## 2021-10-10 MED ORDER — SERTRALINE HCL 50 MG PO TABS
150.0000 mg | ORAL_TABLET | Freq: Every day | ORAL | Status: DC
  Administered 2021-10-10 – 2021-10-12 (×3): 150 mg via ORAL
  Filled 2021-10-10 (×3): qty 3

## 2021-10-10 MED ORDER — MOMETASONE FURO-FORMOTEROL FUM 100-5 MCG/ACT IN AERO
2.0000 | INHALATION_SPRAY | Freq: Two times a day (BID) | RESPIRATORY_TRACT | Status: DC
  Administered 2021-10-10 – 2021-10-12 (×5): 2 via RESPIRATORY_TRACT
  Filled 2021-10-10: qty 8.8

## 2021-10-10 MED ORDER — SERTRALINE HCL 50 MG PO TABS: 150.0000 mg | ORAL_TABLET | Freq: Every day | ORAL | Status: DC

## 2021-10-10 MED ORDER — OXYCODONE HCL 5 MG PO TABS
5.0000 mg | ORAL_TABLET | Freq: Three times a day (TID) | ORAL | Status: DC | PRN
  Administered 2021-10-10 – 2021-10-11 (×3): 5 mg via ORAL
  Filled 2021-10-10 (×3): qty 1

## 2021-10-10 MED ORDER — DOCUSATE SODIUM 50 MG/5ML PO LIQD
50.0000 mg | Freq: Two times a day (BID) | ORAL | Status: DC
  Administered 2021-10-10: 50 mg
  Filled 2021-10-10: qty 10

## 2021-10-10 MED ORDER — SODIUM ZIRCONIUM CYCLOSILICATE 5 G PO PACK
10.0000 g | PACK | Freq: Once | ORAL | Status: AC
Start: 2021-10-10 — End: 2021-10-10
  Administered 2021-10-10: 10 g
  Filled 2021-10-10: qty 2

## 2021-10-10 MED ORDER — SODIUM BICARBONATE 8.4 % IV SOLN
50.0000 meq | Freq: Once | INTRAVENOUS | Status: AC
  Administered 2021-10-10: 50 meq via INTRAVENOUS
  Filled 2021-10-10: qty 50

## 2021-10-10 NOTE — Therapy (Signed)
Pt extubated to Room Air.  Please passed SBT.  No adverse reactions.  [place].

## 2021-10-10 NOTE — Progress Notes (Signed)
PHARMACY CONSULT NOTE - FOLLOW UP  Pharmacy Consult for Electrolyte Monitoring and Replacement   Recent Labs: Potassium (mmol/L)  Date Value  10/10/2021 6.8 (HH)   Magnesium (mg/dL)  Date Value  10/10/2021 2.0   Calcium (mg/dL)  Date Value  10/10/2021 8.2 (L)   Albumin (g/dL)  Date Value  10/10/2021 2.9 (L)   Phosphorus (mg/dL)  Date Value  10/10/2021 4.4   Sodium (mmol/L)  Date Value  10/10/2021 136   Corrected calcium: 9.08 mg/dL  Assessment: 61 year old male presented to ED unresponsive. Past medical history includes arthritis, anxiety, tobacco use (37.50 pack years). Admitted to ICU with acute respiratory distress 2/2 narcotic overuse. Unresponsive to Narcan, and thus was intubated 6/18. Sedated on propofol, fentanyl, and midazolam. Requiring pressor support with norepinephrine. On antibiotics for aspiration pneumonia.   Goal of Therapy:  Within normal limits  Plan:  Initial K+ 6.8 this morning, but after redraw, K+ 5.0. Suspect lab error. No treatment warranted. Follow up AM labs  Wynelle Cleveland, PharmD Pharmacy Resident  10/10/2021 8:04 AM

## 2021-10-10 NOTE — Progress Notes (Signed)
NAME:  Henry Owens, MRN:  [id number], DOB:  [birth year], LOS: 1 ADMISSION DATE:  10/09/2021, CONSULTATION DATE: 10/09/2021 REFERRING MD: Dr. Tamala Julian, CHIEF COMPLAINT: Unresponsiveness    History of Present Illness:  Patient is not able to provide any history history was obtained by the bedside nurse and previous medical records daughter showed up later.  Patient has chronic pain syndrome he goes to [facility] has been dependent on oxycodone past 2 years.  Pts  wife recently had surgery and was prescribed narcotics for pain.  It is suspected the pt took his prescribed pain medication along with his wife's pain medication.   ED Course He presented to the ED with unresponsiveness and poor respiration myolysis that improved with Narcan but not the breathing never lost pulse he was intubated with ET tube for airway protection and he had a lot of secretions in his upper airways.  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 The appearance             of the brain is normal for age. 06/19: Will perform WUA for SBT with plans for possible extubation   Interim History / Subjective:  Pt remains mechanically intubated vent settings: PEEP 5/FiO2 40%.  Following commands on propofol gtt   Objective   Blood pressure 124/69, pulse 76, temperature 97.9 F (36.6 C), temperature source Oral, resp. rate 16, height '5\' 10"'$  (1.778 m), weight 84 kg, SpO2 97 %.    Vent Mode: PRVC FiO2 (%):  [40 %] 40  % Set Rate:  [16 bmp] 16 bmp Vt Set:  [500 mL-5570 mL] 570 mL PEEP:  [5 cmH20] 5 cmH20 Plateau Pressure:  [14 cmH20] 14 cmH20   Intake/Output Summary (Last 24 hours) at 10/10/2021 0805 Last data filed at 10/10/2021 0600 Gross per 24 hour  Intake 3540.83 ml  Output 1450 ml  Net 2090.83 ml   Filed Weights   10/09/21 1300 10/09/21 1510 10/10/21 0348  Weight: 79.6 kg 82.9 kg 84 kg    Examination: General: Acutely ill appearing male, sedated NAD mechanically intubated  HENT: Supple, no JVD  Lungs: Clear throughout, even, non labored  Cardiovascular: NSR, rrr, no r/g, 2+ radial/2+ distal pulses, 1+ bilateral upper extremity edema  Abdomen: +BS x4, obese, soft, non tender, non distended  Extremities: Normal bulk and tone, moves all extremities  Neuro: Sedated, follows commands, PERRL GU: Indwelling foley catheter draining yellow urine   Resolved Hospital Problem list   Lactic acidosis   Assessment & Plan:  Acute respiratory hypercapnic hypoxic respiratory failure secondary to associated CNS depression from an unintentional due to narcotic overuse for chronic pain syndrome  Mechanical Intubation  - Full vent support: vent settings reviewed and established  - Will perform SBT with plans for possible extubation  - Continue VAP bundle - Maintain RASS goal 0 - PAD protocol to maintain RASS goal: Propofol and fentanyl gtts    Hypotension secondary to possible sepsis and sedating medications  Elevated troponin secondary to demand ischemia vs. NSTEMI - Continuous telemetry monitoring - Troponin's peaked at 1,033 - Echo pending  -  Maintain map >65  Acute kidney injury with hyperkalemia and metabolic acidosis  - Trend BMP - Replace electrolytes as indicated  - Monitor UOP - Avoid nephrotoxic medications   Transaminitis suspect secondary to hypotension  - Trend hepatic function panel   Acute toxic encephalopathy secondary to unintentional drug overdose-improving  - Avoid sedating  medications when able   Best Practice (right click and "Reselect all SmartList Selections" daily)   Diet/type: NPO DVT prophylaxis: prophylactic heparin  GI prophylaxis: PPI Lines: N/A Foley:  Yes, and it is still needed Code Status:  full code Last date of multidisciplinary goals of care discussion [10/10/2021]  Labs   CBC: Recent Labs  Lab 10/09/21 1235 10/09/21 1521 10/10/21 0358  WBC 25.0* 12.0* 10.0  NEUTROABS 17.4*  --  8.1*  HGB 12.0* 12.0* 11.6*  HCT 39.0 38.1* 35.9*  MCV 93.8 90.9 88.2  PLT 318 138* 557    Basic Metabolic Panel: Recent Labs  Lab 10/09/21 1235 10/09/21 1521 10/10/21 0358  NA 138 137 136  K 4.8 5.0 6.8*  CL 102 105 108  CO2 24 23 19*  GLUCOSE 139* 88 116*  BUN 19 20 25*  CREATININE 2.44* 2.36* 2.07*  CALCIUM 8.6* 8.2* 8.2*  MG  --   --  2.0  PHOS  --   --  4.4   GFR: Estimated Creatinine Clearance: 39.2 mL/min (A) (by C-G formula based on SCr of 2.07 mg/dL (H)). Recent Labs  Lab 10/09/21 1235 10/09/21 1521 10/09/21 2026 10/10/21 0358  PROCALCITON 5.77  --   --  40.16  WBC 25.0* 12.0*  --  10.0  LATICACIDVEN 4.7* 3.0* 2.7* 1.5    Liver Function Tests: Recent Labs  Lab 10/09/21 1235 10/09/21 1521 10/10/21 0358  AST 42* 78* 51*  ALT '25 28 27  '$ ALKPHOS 197* 175* 136*  BILITOT 0.8 0.7 0.6  PROT 7.4 6.6 5.5*  ALBUMIN 3.8 3.5 2.9*   No results for input(s): "LIPASE", "AMYLASE" in the last 168 hours. No results for input(s): "AMMONIA" in the last 168 hours.  ABG    Component Value Date/Time   PHART 7.26 (L) 10/09/2021 2030   PCO2ART 45 10/09/2021 2030   PO2ART 121 (H) 10/09/2021 2030   HCO3 20.2 10/09/2021 2030   ACIDBASEDEF 6.8 (H) 10/09/2021 2030   O2SAT 99.8 10/09/2021 2030     Coagulation Profile: Recent Labs  Lab 10/09/21 1235  INR 1.3*    Cardiac Enzymes: No results for input(s): "CKTOTAL", "CKMB", "CKMBINDEX", "TROPONINI" in the last 168 hours.  HbA1C: Hgb A1c MFr Bld  Date/Time Value Ref Range  Status  07/05/2021 11:58 PM 5.1 4.8 - 5.6 % Final    Comment:    (NOTE)         Prediabetes: 5.7 - 6.4         Diabetes: >6.4         Glycemic control for adults with diabetes: <7.0     CBG: Recent Labs  Lab 10/09/21 1513 10/09/21 2024 10/10/21 0007 10/10/21 0333 10/10/21 0746  GLUCAP 83 100* 116* 117* 110*    Review of Systems:   Unable to assess pt mechanically intubated   Past Medical History:  He,  has a past medical history of Anxiety, Arthritis, and Flu (07/2017).   Surgical History:   Past Surgical History:  Procedure Laterality Date   APPENDECTOMY  1975   BACK SURGERY     KNEE ARTHROSCOPY WITH MEDIAL MENISECTOMY Right 08/20/2017   Procedure: KNEE ARTHROSCOPY WITH PARTIAL  MEDIAL AND LATERAL MENISECTOMY,PARTIAL SYNOVECTOMY,CHONDROPLASTY, LOOSE BODY REMOVAL;  Surgeon: Lovell Sheehan, MD;  Location: [facility] ORS;  Service: Orthopedics;  Laterality: Right;   [place], 2000     Social History:   reports that he has been smoking cigarettes. He has a 37.50 pack-year smoking history. He has never used smokeless tobacco. He reports that he does not currently use drugs. He reports that he does not drink alcohol.   Family History:  His family history is negative for Bladder Cancer, Prostate cancer, and Hematuria.   Allergies Allergies  Allergen Reactions   Azithromycin Rash   Penicillin G Rash    Has patient had a PCN reaction causing immediate rash, facial/tongue/throat swelling, SOB or lightheadedness with hypotension: Yes Has patient had a PCN reaction causing severe rash involving mucus membranes or skin necrosis: No Has patient had a PCN reaction that required hospitalization: No Has patient had a PCN reaction occurring within the last 10 years: No If all of the above answers are "NO", then may proceed with Cephalosporin use.      Home Medications  Prior to Admission medications   Medication Sig Start Date End Date Taking? Authorizing Provider   ALPRAZolam Duanne Moron) 0.5 MG tablet Take 0.5 mg by mouth 3 (three) times daily as needed. 10/04/21  Yes [provider]  Cyanocobalamin (VITAMIN B 12 PO) Take 1 tablet by mouth daily.   Yes [provider]  docusate sodium (COLACE) 50 MG capsule Take 50 mg by mouth 2 (two) times daily.   Yes [provider]  fluticasone (FLONASE) 50 MCG/ACT nasal spray Place 1 spray into both nostrils daily as needed for allergies or rhinitis.   Yes [provider]  gabapentin (NEURONTIN) 300 MG capsule Take 300 mg by mouth 3 (three) times daily. 09/30/21  Yes [provider]  lisinopril (ZESTRIL) 10 MG tablet Take 10 mg by mouth daily.   Yes [provider]  pantoprazole (PROTONIX) 40 MG tablet Take 1 tablet (40 mg total) by mouth daily. 07/11/21 10/09/21 Yes Enzo Bi, MD  sertraline (ZOLOFT) 100 MG tablet Take 150 mg by mouth daily.   Yes [provider]  SYMBICORT 80-4.5 MCG/ACT inhaler Inhale into the lungs. 10/03/21  Yes [provider]  Tetrahydrozoline HCl (VISINE OP) Place 1 drop into both eyes daily as needed (allergies).   Yes [provider]  tiZANidine (ZANAFLEX) 4 MG tablet Take 1 tablet (4 mg total) by mouth 3 (three) times daily as needed for muscle spasms. Home med. 07/11/21  Yes Enzo Bi, MD  Turmeric (QC TUMERIC COMPLEX PO) Take 1 tablet by mouth daily.   Yes [provider]  gabapentin (NEURONTIN) 100 MG capsule Take 2 capsules by mouth 3 (three) times daily. Patient not taking: Reported on 10/09/2021 06/10/21   [provider]  methocarbamol (ROBAXIN) 500 MG tablet Take 1 tablet (500 mg total) by mouth 3 (three) times daily as needed for muscle spasms. Home med. Patient not taking: Reported on 10/09/2021 07/11/21   Enzo Bi, MD     Critical care time: 98 minutes     Donell Beers, [place] Pager [phone] (please enter 7 digits) PCCM Consult Pager [phone] (please enter 7  digits)

## 2021-10-11 ENCOUNTER — Other Ambulatory Visit: Payer: Self-pay

## 2021-10-11 DIAGNOSIS — T40601D Poisoning by unspecified narcotics, accidental (unintentional), subsequent encounter: Secondary | ICD-10-CM

## 2021-10-11 DIAGNOSIS — J9601 Acute respiratory failure with hypoxia: Secondary | ICD-10-CM | POA: Diagnosis not present

## 2021-10-11 DIAGNOSIS — J9602 Acute respiratory failure with hypercapnia: Secondary | ICD-10-CM | POA: Diagnosis not present

## 2021-10-11 DIAGNOSIS — J189 Pneumonia, unspecified organism: Secondary | ICD-10-CM | POA: Diagnosis not present

## 2021-10-11 LAB — MAGNESIUM: Magnesium: 2.2 mg/dL (ref 1.7–2.4)

## 2021-10-11 LAB — GLUCOSE, CAPILLARY
Glucose-Capillary: 100 mg/dL — ABNORMAL HIGH (ref 70–99)
Glucose-Capillary: 118 mg/dL — ABNORMAL HIGH (ref 70–99)
Glucose-Capillary: 136 mg/dL — ABNORMAL HIGH (ref 70–99)

## 2021-10-11 LAB — BASIC METABOLIC PANEL
Anion gap: 3 — ABNORMAL LOW (ref 5–15)
BUN: 17 mg/dL (ref 6–20)
CO2: 26 mmol/L (ref 22–32)
Calcium: 8.6 mg/dL — ABNORMAL LOW (ref 8.9–10.3)
Chloride: 113 mmol/L — ABNORMAL HIGH (ref 98–111)
Creatinine, Ser: 1.2 mg/dL (ref 0.61–1.24)
GFR, Estimated: >60 mL/min (ref >60)
Glucose, Bld: 95 mg/dL (ref 70–99)
Potassium: 4.4 mmol/L (ref 3.5–5.1)
Sodium: 142 mmol/L (ref 135–145)

## 2021-10-11 LAB — CBC WITH DIFFERENTIAL/PLATELET
Abs Immature Granulocytes: 0.02 10*3/uL (ref 0.00–0.07)
Basophils Absolute: 0 10*3/uL (ref 0.0–0.1)
Basophils Relative: 0 %
Eosinophils Absolute: 0 10*3/uL (ref 0.0–0.5)
Eosinophils Relative: 1 %
HCT: 28.8 % — ABNORMAL LOW (ref 39.0–52.0)
Hemoglobin: 9.4 g/dL — ABNORMAL LOW (ref 13.0–17.0)
Immature Granulocytes: 1 %
Lymphocytes Relative: 22 %
Lymphs Abs: 0.9 10*3/uL (ref 0.7–4.0)
MCH: 28.7 pg (ref 26.0–34.0)
MCHC: 32.6 g/dL (ref 30.0–36.0)
MCV: 87.8 fL (ref 80.0–100.0)
Monocytes Absolute: 0.3 10*3/uL (ref 0.1–1.0)
Monocytes Relative: 8 %
Neutro Abs: 2.8 10*3/uL (ref 1.7–7.7)
Neutrophils Relative %: 68 %
Platelets: 84 10*3/uL — ABNORMAL LOW (ref 150–400)
RBC: 3.28 MIL/uL — ABNORMAL LOW (ref 4.22–5.81)
RDW: 13.8 % (ref 11.5–15.5)
WBC: 4.1 10*3/uL (ref 4.0–10.5)
nRBC: 0 % (ref 0.0–0.2)

## 2021-10-11 LAB — PHOSPHORUS: Phosphorus: 2.1 mg/dL — ABNORMAL LOW (ref 2.5–4.6)

## 2021-10-11 LAB — ECHOCARDIOGRAM COMPLETE
Area-P 1/2: 3.79 cm2
Calc EF: 57.5 %
Height: 70 in
S' Lateral: 3.19 cm
Single Plane A2C EF: 61.6 %
Single Plane A4C EF: 52.8 %
Weight: 2962.98 oz

## 2021-10-11 LAB — PROCALCITONIN: Procalcitonin: 19.17 ng/mL

## 2021-10-11 MED ORDER — POTASSIUM PHOSPHATES 15 MMOLE/5ML IV SOLN
15.0000 mmol | Freq: Once | INTRAVENOUS | Status: AC
  Administered 2021-10-11: 15 mmol via INTRAVENOUS
  Filled 2021-10-11: qty 5

## 2021-10-11 MED ORDER — IPRATROPIUM-ALBUTEROL 0.5-2.5 (3) MG/3ML IN SOLN: 3.0000 mL | Freq: Four times a day (QID) | RESPIRATORY_TRACT | Status: DC | PRN

## 2021-10-11 NOTE — Progress Notes (Signed)
PHARMACY CONSULT NOTE - FOLLOW UP  Pharmacy Consult for Electrolyte Monitoring and Replacement   Recent Labs: Potassium (mmol/L)  Date Value  10/11/2021 4.4   Magnesium (mg/dL)  Date Value  10/11/2021 2.2   Calcium (mg/dL)  Date Value  10/11/2021 8.6 (L)   Albumin (g/dL)  Date Value  10/10/2021 2.9 (L)   Phosphorus (mg/dL)  Date Value  10/11/2021 2.1 (L)   Sodium (mmol/L)  Date Value  10/11/2021 142   Corrected calcium: 9.48 mg/dL  Assessment: 61 year old male presented to ED unresponsive. Past medical history includes arthritis, anxiety, tobacco use (37.50 pack years). Admitted to ICU with acute respiratory distress 2/2 narcotic overuse. Unresponsive to Narcan, and thus was intubated 6/18. Sedated on propofol, fentanyl, and midazolam. Requiring pressor support with norepinephrine. On antibiotics for aspiration pneumonia. On 6/20, patient was extubated. Diet has been advanced to thin fluids.   Goal of Therapy:  Within normal limits  Plan:  Phos 2.1. Give Potassium phosphate IV 15 mmol x 1. Follow up AM labs  Wynelle Cleveland, PharmD Pharmacy Resident  10/11/2021 9:39 AM

## 2021-10-11 NOTE — Progress Notes (Signed)
PROGRESS NOTE HPI was taken from Dr. Milon Dikes: Patient is not able to provide any history history was obtained by the bedside nurse and previous medical records daughter showed up later.  Patient has chronic pain syndrome he goes to [facility] has been dependent on oxycodone past 2 years his wife had surgery lately she was confused overnight agitated when the daughter got there I did asked her for the pain medication pills container for her mom and assumed that he took some of that medication because he was in pain as well top of his regular medications. He presented to the ED with unresponsiveness and poor respiration myolysis that improved with Narcan but not the breathing never lost pulse he was intubated with ET tube for airway protection and he had a lot of secretions in his upper airways.     As per NP Meda Coffee: 6/18: Pt admitted with acute encephalopathy secondary to suspected unintentional drug overdose requiring mechanical intubation for airway protection  06/18: CT Head revealed no acute intracranial abnormalities. The appearance             of the brain is normal for age. 06/19: Will perform WUA for SBT with plans for possible extubation    Graydon Fofana  CZY:[id number] DOB: 20-Jul-1960 DOA: 10/09/2021 PCP: Albina Billet, MD    Assessment & Plan:   Principal Problem:   Acute respiratory failure with hypoxia and hypercapnia ([place]) Active Problems:   Opiate overdose ([place])   Unresponsive state  Assessment and Plan:  Acute respiratory distress: s/p intubation, ventilation & extubation. No documented SaOs below 89%. Likely secondary to unintentional narcotic overuse from chronic pain syndrome. Pt stated he took percocet, gabapentin & alprazolam at once   Chronic pain syndrome: continue on percocet prn   Possible CAP: as per CXR. Continue on IV rocephin, bronchodilators & encourage incentive spirometry   Hypotension: resolved  Lactic acidosis: resolved  AKI: Cr is trending down  from day prior. Avoid nephrotoxic meds   Depression: severity unknown. Continue on home dose of sertraline  Transaminitis: likely secondary to hypotension. Will continue to monitor   Acute toxic encephalopathy: likely secondary drug overdose. Mental status is back to baseline    DVT prophylaxis: heparin SQ Code Status: full  Family Communication:  Disposition Plan: likely d/c back home  Level of care: Stepdown  Status is: Inpatient Remains inpatient appropriate because: severity of illness    Consultants:  ICU   Procedures:   Antimicrobials: rocpehin    Subjective: Pt c/o fatigue   Objective: Vitals:   10/11/21 0400 10/11/21 0500 10/11/21 0600 10/11/21 0744  BP: (!) 99/52 136/68 138/77   Pulse: 73 74 76   Resp: '19 18 17   '$ Temp: 98.8 F (37.1 C)   98.4 F (36.9 C)  TempSrc: Oral   Oral  SpO2: 91% 94% 94%   Weight:  84 kg    Height:        Intake/Output Summary (Last 24 hours) at 10/11/2021 0829 Last data filed at 10/11/2021 0932 Gross per 24 hour  Intake 108.56 ml  Output 2975 ml  Net -2866.44 ml   Filed Weights   10/09/21 1510 10/10/21 0348 10/11/21 0500  Weight: 82.9 kg 84 kg 84 kg    Examination:  General exam: Appears calm and comfortable  Respiratory system: diminished breath sounds b/l  Cardiovascular system: S1 & S2+. No rubs, gallops or clicks.  Gastrointestinal system: Abdomen is nondistended, soft and nontender. Normal bowel sounds heard. Central nervous system: Alert  and oriented. Moves all extremities  Psychiatry: Judgement and insight appear normal. Flat mood and affect     Data Reviewed: I have personally reviewed following labs and imaging studies  CBC: Recent Labs  Lab 10/09/21 1235 10/09/21 1521 10/10/21 0358 10/11/21 0320  WBC 25.0* 12.0* 10.0 4.1  NEUTROABS 17.4*  --  8.1* 2.8  HGB 12.0* 12.0* 11.6* 9.4*  HCT 39.0 38.1* 35.9* 28.8*  MCV 93.8 90.9 88.2 87.8  PLT 318 138* 172 84*   Basic Metabolic Panel: Recent Labs   Lab 10/09/21 1235 10/09/21 1521 10/10/21 0358 10/10/21 0954 10/10/21 1636 10/11/21 0320  NA 138 137 136  --   --  142  K 4.8 5.0 6.8* 5.0 5.0 4.4  CL 102 105 108  --   --  113*  CO2 24 23 19*  --   --  26  GLUCOSE 139* 88 116*  --   --  95  BUN 19 20 25*  --   --  17  CREATININE 2.44* 2.36* 2.07*  --   --  1.20  CALCIUM 8.6* 8.2* 8.2*  --   --  8.6*  MG  --   --  2.0  --   --  2.2  PHOS  --   --  4.4  --   --  2.1*   GFR: Estimated Creatinine Clearance: 67.6 mL/min (by C-G formula based on SCr of 1.2 mg/dL). Liver Function Tests: Recent Labs  Lab 10/09/21 1235 10/09/21 1521 10/10/21 0358  AST 42* 78* 51*  ALT '25 28 27  '$ ALKPHOS 197* 175* 136*  BILITOT 0.8 0.7 0.6  PROT 7.4 6.6 5.5*  ALBUMIN 3.8 3.5 2.9*   No results for input(s): "LIPASE", "AMYLASE" in the last 168 hours. No results for input(s): "AMMONIA" in the last 168 hours. Coagulation Profile: Recent Labs  Lab 10/09/21 1235  INR 1.3*   Cardiac Enzymes: No results for input(s): "CKTOTAL", "CKMB", "CKMBINDEX", "TROPONINI" in the last 168 hours. BNP (last 3 results) No results for input(s): "PROBNP" in the last 8760 hours. HbA1C: No results for input(s): "HGBA1C" in the last 72 hours. CBG: Recent Labs  Lab 10/10/21 1614 10/10/21 2006 10/10/21 2258 10/11/21 0325 10/11/21 0742  GLUCAP 104* 116* 97 100* 118*   Lipid Profile: Recent Labs    10/10/21 0358  TRIG 322*   Thyroid Function Tests: No results for input(s): "TSH", "T4TOTAL", "FREET4", "T3FREE", "THYROIDAB" in the last 72 hours. Anemia Panel: No results for input(s): "VITAMINB12", "FOLATE", "FERRITIN", "TIBC", "IRON", "RETICCTPCT" in the last 72 hours. Sepsis Labs: Recent Labs  Lab 10/09/21 1235 10/09/21 1521 10/09/21 2026 10/10/21 0358 10/10/21 0954 10/11/21 0320  PROCALCITON 5.77  --   --  40.16 31.48 19.17  LATICACIDVEN 4.7* 3.0* 2.7* 1.5  --   --     Recent Results (from the past 240 hour(s))  MRSA Next Gen by PCR, Nasal      Status: None   Collection Time: 10/09/21  3:15 PM   Specimen: Nasal Mucosa; Nasal Swab  Result Value Ref Range Status   MRSA by PCR Next Gen NOT DETECTED NOT DETECTED Final    Comment: (NOTE) The GeneXpert MRSA Assay (FDA approved for NASAL specimens only), is one component of a comprehensive MRSA colonization surveillance program. It is not intended to diagnose MRSA infection nor to guide or monitor treatment for MRSA infections. Test performance is not FDA approved in patients less than 1 years old. Performed at [facility], [place],  [place], [place] [address]   Culture, Respiratory w Gram Stain     Status: None (Preliminary result)   Collection Time: 10/09/21  8:47 PM   Specimen: Tracheal Aspirate; Respiratory  Result Value Ref Range Status   Specimen Description   Final    TRACHEAL ASPIRATE Performed at [facility], [address], [place], [place] [address]    Special Requests   Final    NONE Performed at [facility], [address] [place], [place] [address]    Gram Stain   Final    NO SQUAMOUS EPITHELIAL CELLS SEEN NO WBC SEEN MODERATE GRAM POSITIVE COCCI    Culture   Final    MODERATE KLEBSIELLA PNEUMONIAE CULTURE REINCUBATED FOR BETTER GROWTH Performed at [facility], [place] [address], [place], [place] [address]    Report Status PENDING  Incomplete  Culture, blood (Routine X 2) w Reflex to ID Panel     Status: None (Preliminary result)   Collection Time: 10/10/21  9:54 AM   Specimen: BLOOD  Result Value Ref Range Status   Specimen Description BLOOD BLOOD RIGHT WRIST  Final   Special Requests   Final    BOTTLES DRAWN AEROBIC AND ANAEROBIC Blood Culture adequate volume   Culture   Final    NO GROWTH < 24 HOURS Performed at [facility], [address], [place], [place] [address]    Report Status PENDING  Incomplete  Culture, blood (Routine X 2) w Reflex to ID Panel     Status: None (Preliminary result)    Collection Time: 10/10/21  9:54 AM   Specimen: BLOOD  Result Value Ref Range Status   Specimen Description BLOOD RIGHT ANTECUBITAL  Final   Special Requests   Final    BOTTLES DRAWN AEROBIC AND ANAEROBIC Blood Culture adequate volume   Culture   Final    NO GROWTH < 24 HOURS Performed at [facility], [address], [place],  [address]    Report Status PENDING  Incomplete         Radiology Studies: DG Chest Port 1 View  Result Date: 10/10/2021 CLINICAL DATA:  Ventilator dependence. EXAM: PORTABLE CHEST 1 VIEW COMPARISON:  10/09/2021 FINDINGS: 0517 hours. Endotracheal tube tip is approximately 3.8 cm above the base of the carina The NG tube passes into the stomach although the distal tip position is not included on the film. Low volume film. Vascular congestion noted with interstitial prominence, basilar predominant. No substantial pleural effusion or dense focal airspace consolidation. Telemetry leads overlie the chest. IMPRESSION: 1. No substantial change in exam. 2. Low volume film with vascular congestion and basilar predominant interstitial prominence. Electronically Signed   By: Misty Stanley M.D.   On: 10/10/2021 07:03   DG Abd Portable 1V  Result Date: 10/09/2021 CLINICAL DATA:  Orogastric tube placement EXAM: PORTABLE ABDOMEN - 1 VIEW COMPARISON:  07/06/2021 FINDINGS: Limited radiograph of the lower chest and upper abdomen was obtained for the purposes of enteric tube localization. Enteric tube is seen coursing below the diaphragm with distal tip and side port terminating within the expected location of the gastric body. 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 IMPRESSION: Enteric tube tip and side port project within the expected location of the gastric body. Electronically Signed   By: Davina Poke D.O.   On: 10/09/2021 17:43   DG Chest Portable 1 View  Result Date: 10/09/2021 CLINICAL DATA:  61 year old  male  status post intubation. EXAM: PORTABLE CHEST 1 VIEW COMPARISON:  Chest x-ray 07/08/2021. FINDINGS: An endotracheal tube is in place with tip 3.7 cm above the carina. Nasogastric tube extends into the mid body of the stomach. Lung volumes are slightly low. No confluent consolidative airspace disease. However, there is extensive interstitial prominence and peribronchial cuffing most evident in the mid to lower lungs bilaterally. No pleural effusions. No pneumothorax. No evidence of pulmonary edema. Heart size is normal. Upper mediastinal contours are within normal limits. IMPRESSION: 1. Support apparatus, as above. 2. The appearance of the lungs suggests severe acute bronchitis. Electronically Signed   By: Vinnie Langton M.D.   On: 10/09/2021 13:41   CT HEAD WO CONTRAST (5MM)  Result Date: 10/09/2021 CLINICAL DATA:  61 year old male unresponsive patient. EXAM: CT HEAD WITHOUT CONTRAST TECHNIQUE: Contiguous axial images were obtained from the base of the skull through the vertex without intravenous contrast. RADIATION DOSE REDUCTION: This exam was performed according to the departmental dose-optimization program which includes automated exposure control, adjustment of the mA and/or kV according to patient size and/or use of iterative reconstruction technique. COMPARISON:  Head CT 07/05/2021. FINDINGS: Brain: No evidence of acute infarction, hemorrhage, hydrocephalus, extra-axial collection or mass lesion/mass effect. Vascular: No hyperdense vessel or unexpected calcification. Skull: Normal. Negative for fracture or focal lesion. Sinuses/Orbits: Small amount of frothy secretions are noted dependently in the right maxillary sinus. Other: Endotracheal and orogastric tubes incompletely imaged. IMPRESSION: 1. No acute intracranial abnormalities. The appearance of the brain is normal for age. Electronically Signed   By: Vinnie Langton M.D.   On: 10/09/2021 13:34        Scheduled Meds:  Chlorhexidine  Gluconate Cloth  6 each Topical Q0600   gabapentin  300 mg Oral TID   heparin  5,000 Units Subcutaneous Q8H   mometasone-formoterol  2 puff Inhalation BID   nitroGLYCERIN  1 inch Topical Q8H   pantoprazole  40 mg Oral Daily   sertraline  150 mg Oral Daily   vitamin B-12  500 mcg Oral Daily   Continuous Infusions:  sodium chloride     cefTRIAXone (ROCEPHIN)  IV Stopped (10/10/21 1501)     LOS: 2 days    Time spent: 33 mins     Wyvonnia Dusky, MD [facility] Pager [phone]  If 7PM-7AM, please contact night-coverage [email or web address] 10/11/2021, 8:29 AM

## 2021-10-12 ENCOUNTER — Inpatient Hospital Stay: Payer: Medicaid Other

## 2021-10-12 DIAGNOSIS — J690 Pneumonitis due to inhalation of food and vomit: Secondary | ICD-10-CM

## 2021-10-12 DIAGNOSIS — K5900 Constipation, unspecified: Secondary | ICD-10-CM

## 2021-10-12 DIAGNOSIS — R778 Other specified abnormalities of plasma proteins: Secondary | ICD-10-CM

## 2021-10-12 DIAGNOSIS — T40604A Poisoning by unspecified narcotics, undetermined, initial encounter: Secondary | ICD-10-CM | POA: Diagnosis not present

## 2021-10-12 DIAGNOSIS — R1011 Right upper quadrant pain: Secondary | ICD-10-CM

## 2021-10-12 DIAGNOSIS — G8929 Other chronic pain: Secondary | ICD-10-CM

## 2021-10-12 DIAGNOSIS — J9601 Acute respiratory failure with hypoxia: Secondary | ICD-10-CM | POA: Diagnosis not present

## 2021-10-12 DIAGNOSIS — R4189 Other symptoms and signs involving cognitive functions and awareness: Secondary | ICD-10-CM | POA: Diagnosis not present

## 2021-10-12 DIAGNOSIS — J189 Pneumonia, unspecified organism: Secondary | ICD-10-CM

## 2021-10-12 LAB — CBC WITH DIFFERENTIAL/PLATELET
Abs Immature Granulocytes: 0.01 10*3/uL (ref 0.00–0.07)
Basophils Absolute: 0 10*3/uL (ref 0.0–0.1)
Basophils Relative: 0 %
Eosinophils Absolute: 0 10*3/uL (ref 0.0–0.5)
Eosinophils Relative: 1 %
HCT: 27.8 % — ABNORMAL LOW (ref 39.0–52.0)
Hemoglobin: 9.1 g/dL — ABNORMAL LOW (ref 13.0–17.0)
Immature Granulocytes: 0 %
Lymphocytes Relative: 21 %
Lymphs Abs: 0.9 10*3/uL (ref 0.7–4.0)
MCH: 28.8 pg (ref 26.0–34.0)
MCHC: 32.7 g/dL (ref 30.0–36.0)
MCV: 88 fL (ref 80.0–100.0)
Monocytes Absolute: 0.4 10*3/uL (ref 0.1–1.0)
Monocytes Relative: 8 %
Neutro Abs: 3 10*3/uL (ref 1.7–7.7)
Neutrophils Relative %: 70 %
Platelets: 91 10*3/uL — ABNORMAL LOW (ref 150–400)
RBC: 3.16 MIL/uL — ABNORMAL LOW (ref 4.22–5.81)
RDW: 13.7 % (ref 11.5–15.5)
WBC: 4.3 10*3/uL (ref 4.0–10.5)
nRBC: 0 % (ref 0.0–0.2)

## 2021-10-12 LAB — BASIC METABOLIC PANEL
Anion gap: 4 — ABNORMAL LOW (ref 5–15)
BUN: 9 mg/dL (ref 6–20)
CO2: 27 mmol/L (ref 22–32)
Calcium: 9 mg/dL (ref 8.9–10.3)
Chloride: 113 mmol/L — ABNORMAL HIGH (ref 98–111)
Creatinine, Ser: 0.92 mg/dL (ref 0.61–1.24)
GFR, Estimated: >60 mL/min (ref >60)
Glucose, Bld: 103 mg/dL — ABNORMAL HIGH (ref 70–99)
Potassium: 3.5 mmol/L (ref 3.5–5.1)
Sodium: 144 mmol/L (ref 135–145)

## 2021-10-12 LAB — CULTURE, RESPIRATORY W GRAM STAIN: Gram Stain: NONE SEEN

## 2021-10-12 LAB — PROCALCITONIN: Procalcitonin: 8.89 ng/mL

## 2021-10-12 LAB — MAGNESIUM: Magnesium: 2.2 mg/dL (ref 1.7–2.4)

## 2021-10-12 LAB — PHOSPHORUS: Phosphorus: 3 mg/dL (ref 2.5–4.6)

## 2021-10-12 MED ORDER — CEFDINIR 300 MG PO CAPS
300.0000 mg | ORAL_CAPSULE | Freq: Two times a day (BID) | ORAL | Status: DC
  Filled 2021-10-12: qty 1

## 2021-10-12 MED ORDER — POLYETHYLENE GLYCOL 3350 17 G PO PACK: 17.0000 g | PACK | Freq: Every day | ORAL | 0 refills | Status: DC | PRN

## 2021-10-12 MED ORDER — NALOXONE HCL 4 MG/0.1ML NA LIQD: NASAL | 0 refills | Status: DC

## 2021-10-12 MED ORDER — CEFDINIR 300 MG PO CAPS: 300.0000 mg | ORAL_CAPSULE | Freq: Two times a day (BID) | ORAL | 0 refills | Status: DC

## 2021-10-12 MED ORDER — OXYCODONE HCL 5 MG PO TABS: 5.0000 mg | ORAL_TABLET | Freq: Four times a day (QID) | ORAL | 0 refills | Status: DC | PRN

## 2021-10-12 MED ORDER — POTASSIUM CHLORIDE CRYS ER 20 MEQ PO TBCR
40.0000 meq | EXTENDED_RELEASE_TABLET | Freq: Once | ORAL | Status: AC
Start: 2021-10-12 — End: 2021-10-12
  Administered 2021-10-12: 40 meq via ORAL
  Filled 2021-10-12: qty 2

## 2021-10-12 NOTE — Assessment & Plan Note (Signed)
Right upper quadrant ultrasound negative for acute cholecystitis.  Patient feels better after bowel movement.

## 2021-10-12 NOTE — Assessment & Plan Note (Signed)
In reviewing the PDMP review, I did not see any pain or anxiety medications listed there.  Unclear where he is getting his medications from.  He states he gets it from his PCP.  I did prescribe 8 pills of low-dose oxycodone 5 mg.  I also prescribed Narcan as needed.

## 2021-10-12 NOTE — Progress Notes (Signed)
PHARMACY CONSULT NOTE - FOLLOW UP  Pharmacy Consult for Electrolyte Monitoring and Replacement   Recent Labs: Potassium (mmol/L)  Date Value  10/12/2021 3.5   Magnesium (mg/dL)  Date Value  10/12/2021 2.2   Calcium (mg/dL)  Date Value  10/12/2021 9.0   Albumin (g/dL)  Date Value  10/10/2021 2.9 (L)   Phosphorus (mg/dL)  Date Value  10/12/2021 3.0   Sodium (mmol/L)  Date Value  10/12/2021 144   Corrected calcium: 9.48 mg/dL  Assessment: 61 year old male presented to ED unresponsive. Past medical history includes arthritis, anxiety, tobacco use (37.50 pack years). Admitted to ICU with acute respiratory distress 2/2 narcotic overuse. Unresponsive to Narcan, and thus was intubated 6/18. Extubated 6/19. Diet advanced.   Goal of Therapy:  Within normal limits  Plan:  K 4.4 > 3.5 over 24 hours. Potassium chloride PO 40 mEq x 1 ordered by medical team.  Follow up AM labs  Wynelle Cleveland, PharmD Pharmacy Resident  10/12/2021 7:48 AM

## 2021-10-12 NOTE — Assessment & Plan Note (Signed)
Continue Colace and MiraLAX.

## 2021-10-12 NOTE — Progress Notes (Signed)
[facility] ADULT ICU REPLACEMENT PROTOCOL   The patient does apply for the [facility] Adult ICU Electrolyte Replacment Protocol based on the criteria listed below:   1.Exclusion criteria: TCTS patients, ECMO patients, and Dialysis patients 2. Is GFR >/= 30 ml/min? Yes.    Patient's GFR today is >60 3. Is SCr </= 2? Yes.   Patient's SCr is 0.92 mg/dL 4. Did SCr increase >/= 0.5 in 24 hours? No. 5.Pt's weight >40kg  Yes.   6. Abnormal electrolyte(s): K 3.5  7. Electrolytes replaced per protocol   Christeen Douglas 10/12/2021 5:50 AM

## 2021-10-12 NOTE — Assessment & Plan Note (Signed)
Secondary to narcotic overdose

## 2021-10-12 NOTE — Assessment & Plan Note (Signed)
No complaints of chest pain or shortness of breath.  This was likely demand ischemia from unresponsive state and acute respiratory failure requiring intubation.

## 2021-10-12 NOTE — Discharge Summary (Addendum)
Physician Discharge Summary   Patient: Henry Owens MRN: [id number] DOB: [birth year]  Admit date:     10/09/2021  Discharge date: 10/12/21  Discharge Physician: Henry Owens   PCP: Henry Billet, MD   Recommendations at discharge:   Follow-up Dr. Hall Owens 5 days  Discharge Diagnoses: Principal Problem:   Acute respiratory failure with hypoxia and hypercapnia (HCC) Active Problems:   Unresponsive state   Opiate overdose (HCC)   Aspiration pneumonia (HCC)   Right upper quadrant pain   Constipation   Chronic pain   Elevated troponin    Hospital Course: The patient was admitted on 10/09/2021 and discharged on 10/12/2021.  The patient presented to the ED with unresponsiveness and poor respirations and improved a little bit with Narcan but he required ET tube for airway protection.  Patient had aspiration pneumonia and was given antibiotics.  Patient also had hypotension during the hospital course.  The patient also had elevated troponin likely secondary to demand ischemia with acute hypoxic respiratory failure.  The patient was extubated and saturating well on room air.  Mental status improved.  In reviewing the PDMP review he did not have any pain meds or anxiety medications listed there.  He states he takes 15 mg of Oxycodone every 6 hours.  Assessment and Plan: * Acute respiratory failure with hypoxia and hypercapnia (HCC) Acute hypoxic respiratory distress with hypercarbia with pulse ox below 89%.  This is all secondary to narcotic overdose.  Unresponsive state Secondary to narcotic overdose  Opiate overdose (Henry Owens) Secondary to narcotic overdose  Aspiration pneumonia (Henry Owens) Secondary to Klebsiella.  Patient was given Rocephin during the hospital course and will give [facility] upon discharge.  Elevated troponin No complaints of chest pain or shortness of breath.  This was likely demand ischemia from unresponsive state and acute respiratory failure requiring  intubation.  Chronic pain In reviewing the PDMP review, I did not see any pain or anxiety medications listed there.  Unclear where he is getting his medications from.  He states he gets it from his PCP.  I did prescribe 8 pills of low-dose oxycodone 5 mg.  I also prescribed Narcan as needed.  Constipation Continue Colace and MiraLAX.  Right upper quadrant pain Right upper quadrant ultrasound negative for acute cholecystitis.  Patient feels better after bowel movement.         Consultants: Critical care specialist Procedures performed: Intubation and extubation Disposition: Home Diet recommendation:  Cardiac diet DISCHARGE MEDICATION: Allergies as of 10/12/2021       Reactions   Azithromycin Rash   Penicillin G Rash   Has patient had a PCN reaction causing immediate rash, facial/tongue/throat swelling, SOB or lightheadedness with hypotension: Yes Has patient had a PCN reaction causing severe rash involving mucus membranes or skin necrosis: No Has patient had a PCN reaction that required hospitalization: No Has patient had a PCN reaction occurring within the last 10 years: No If all of the above answers are "NO", then may proceed with Cephalosporin use.        Medication List     STOP taking these medications    ALPRAZolam 0.5 MG tablet Commonly known as: XANAX   lisinopril 10 MG tablet Commonly known as: ZESTRIL   QC TUMERIC COMPLEX PO   tiZANidine 4 MG tablet Commonly known as: ZANAFLEX       TAKE these medications    cefdinir 300 MG capsule Commonly known as: OMNICEF Take 1 capsule (300 mg total) by mouth 2 (two) times  daily.   docusate sodium 50 MG capsule Commonly known as: COLACE Take 50 mg by mouth 2 (two) times daily.   fluticasone 50 MCG/ACT nasal spray Commonly known as: FLONASE Place 1 spray into both nostrils daily as needed for allergies or rhinitis.   gabapentin 300 MG capsule Commonly known as: NEURONTIN Take 300 mg by mouth 3  (three) times daily.   naloxone 4 MG/0.1ML Liqd nasal spray kit Commonly known as: NARCAN As needed For opioid reversal (unresponsiveness) intranasal spray   oxyCODONE 5 MG immediate release tablet Commonly known as: Oxy IR/ROXICODONE Take 1 tablet (5 mg total) by mouth every 6 (six) hours as needed for moderate pain.   pantoprazole 40 MG tablet Commonly known as: Protonix Take 1 tablet (40 mg total) by mouth daily.   polyethylene glycol 17 g packet Commonly known as: MIRALAX / GLYCOLAX Take 17 g by mouth daily as needed for moderate constipation.   sertraline 100 MG tablet Commonly known as: ZOLOFT Take 150 mg by mouth daily.   Symbicort 80-4.5 MCG/ACT inhaler Generic drug: budesonide-formoterol Inhale into the lungs.   VISINE OP Place 1 drop into both eyes daily as needed (allergies).   VITAMIN B 12 PO Take 1 tablet by mouth daily.        Discharge Exam: Filed Weights   10/10/21 0348 10/11/21 0500 10/12/21 0500  Weight: 84 kg 84 kg 83.2 kg   Physical Exam HENT:     Head: Normocephalic.     Mouth/Throat:     Pharynx: No oropharyngeal exudate.  Eyes:     General: Lids are normal.     Conjunctiva/sclera: Conjunctivae normal.  Cardiovascular:     Rate and Rhythm: Normal rate and regular rhythm.     Heart sounds: Normal heart sounds, S1 normal and S2 normal.  Pulmonary:     Breath sounds: No decreased breath sounds, wheezing, rhonchi or rales.  Abdominal:     Palpations: Abdomen is soft.     Tenderness: There is abdominal tenderness in the right upper quadrant.  Musculoskeletal:     Right lower leg: No swelling.     Left lower leg: No swelling.  Skin:    General: Skin is warm.     Findings: No rash.  Neurological:     Mental Status: He is alert and oriented to person, place, and time.  Psychiatric:     Comments: No suicidal or homicidal ideation      Condition at discharge: stable  The results of significant diagnostics from this hospitalization  (including imaging, microbiology, ancillary and laboratory) are listed below for reference.   Imaging Studies: US Abdomen Limited RUQ (LIVER/GB)  Result Date: 10/12/2021 CLINICAL DATA:  Abdominal pain EXAM: ULTRASOUND ABDOMEN LIMITED RIGHT UPPER QUADRANT COMPARISON:  None Available. FINDINGS: Gallbladder: No gallstones or wall thickening visualized. No sonographic Murphy sign noted by sonographer. Common bile duct: Diameter: 3.4 mm Liver: No focal lesion identified. Increased in parenchymal echogenicity. Portal vein is patent on color Doppler imaging with normal direction of blood flow towards the liver. Other: None. IMPRESSION: 1. No sonographic evidence of acute cholecystitis. 2. Hepatic steatosis. Electronically Signed   By: Yetta Glassman M.D.   On: 10/12/2021 09:37   ECHOCARDIOGRAM COMPLETE  Result Date: 10/11/2021    ECHOCARDIOGRAM REPORT   Patient Name:   MELANIE PELLOT Date of Exam: 10/10/2021 Medical Rec #:  [id number]            Height:       70.0 in  Accession #:    [id number]           Weight:       185.2 lb Date of Birth:  [birth year]            BSA:          2.020 m Patient Age:    20 years             BP:           129/83 mmHg Patient Gender: M                    HR:           87 bpm. Exam Location:  [facility] Procedure: 2D Echo, Cardiac Doppler and Color Doppler Indications:     Cardiomyopathy-Unspecified I42.9  History:         Patient has no prior history of Echocardiogram examinations.  Sonographer:     Bernadene Person RDCS Referring Phys:  [id number] BRITTON L [place] Diagnosing Phys: Kathlyn Sacramento MD IMPRESSIONS  1. Left ventricular ejection fraction, by estimation, is 55 to 60%. The left ventricle has normal function. The left ventricle has no regional wall motion abnormalities. Left ventricular diastolic parameters were normal.  2. Right ventricular systolic function is normal. The right ventricular size is normal. Tricuspid regurgitation signal is inadequate for assessing PA  pressure.  3. The mitral valve is normal in structure. No evidence of mitral valve regurgitation. No evidence of mitral stenosis.  4. The aortic valve is normal in structure. Aortic valve regurgitation is not visualized. No aortic stenosis is present.  5. Aortic dilatation noted. There is borderline dilatation of the ascending aorta, measuring 38 mm.  6. The inferior vena cava is normal in size with <50% respiratory variability, suggesting right atrial pressure of 8 mmHg. FINDINGS  Left Ventricle: Left ventricular ejection fraction, by estimation, is 55 to 60%. The left ventricle has normal function. The left ventricle has no regional wall motion abnormalities. The left ventricular internal cavity size was normal in size. There is  no left ventricular hypertrophy. Left ventricular diastolic parameters were normal. Right Ventricle: The right ventricular size is normal. No increase in right ventricular wall thickness. Right ventricular systolic function is normal. Tricuspid regurgitation signal is inadequate for assessing PA pressure. Left Atrium: Left atrial size was normal in size. Right Atrium: Right atrial size was normal in size. Pericardium: There is no evidence of pericardial effusion. Mitral Valve: The mitral valve is normal in structure. No evidence of mitral valve regurgitation. No evidence of mitral valve stenosis. Tricuspid Valve: The tricuspid valve is normal in structure. Tricuspid valve regurgitation is trivial. No evidence of tricuspid stenosis. Aortic Valve: The aortic valve is normal in structure. Aortic valve regurgitation is not visualized. No aortic stenosis is present. Pulmonic Valve: The pulmonic valve was normal in structure. Pulmonic valve regurgitation is not visualized. No evidence of pulmonic stenosis. Aorta: Aortic dilatation noted. There is borderline dilatation of the ascending aorta, measuring 38 mm. Venous: The inferior vena cava is normal in size with less than 50% respiratory  variability, suggesting right atrial pressure of 8 mmHg. IAS/Shunts: No atrial level shunt detected by color flow Doppler.  LEFT VENTRICLE PLAX 2D LVIDd:         4.51 cm     Diastology LVIDs:         3.19 cm     LV e' medial:    8.70 cm/s LV PW:  0.74 cm     LV E/e' medial:  10.3 LV IVS:        0.79 cm     LV e' lateral:   10.30 cm/s LVOT diam:     1.90 cm     LV E/e' lateral: 8.7 LV SV:         56 LV SV Index:   28 LVOT Area:     2.84 cm  LV Volumes (MOD) LV vol d, MOD A2C: 70.8 ml LV vol d, MOD A4C: 92.8 ml LV vol s, MOD A2C: 27.2 ml LV vol s, MOD A4C: 43.8 ml LV SV MOD A2C:     43.6 ml LV SV MOD A4C:     92.8 ml LV SV MOD BP:      47.0 ml RIGHT VENTRICLE RV S prime:     13.60 cm/s TAPSE (M-mode): 2.1 cm LEFT ATRIUM             Index        RIGHT ATRIUM           Index LA diam:        2.90 cm 1.44 cm/m   RA Area:     19.70 cm LA Vol (A2C):   37.2 ml 18.41 ml/m  RA Volume:   55.40 ml  27.42 ml/m LA Vol (A4C):   42.7 ml 21.14 ml/m LA Biplane Vol: 41.5 ml 20.54 ml/m  AORTIC VALVE LVOT Vmax:   117.00 cm/s LVOT Vmean:  76.200 cm/s LVOT VTI:    0.197 m  AORTA Ao Root diam: 3.60 cm Ao Asc diam:  3.80 cm MITRAL VALVE MV Area (PHT): 3.79 cm    SHUNTS MV Decel Time: 200 msec    Systemic VTI:  0.20 m MV E velocity: 89.70 cm/s  Systemic Diam: 1.90 cm MV A velocity: 80.50 cm/s MV E/A ratio:  1.11 Kathlyn Sacramento MD Electronically signed by Kathlyn Sacramento MD Signature Date/Time: 10/11/2021/8:32:48 AM    Final    DG Chest Port 1 View  Result Date: 10/10/2021 CLINICAL DATA:  Ventilator dependence. EXAM: PORTABLE CHEST 1 VIEW COMPARISON:  10/09/2021 FINDINGS: 0517 hours. Endotracheal tube tip is approximately 3.8 cm above the base of the carina The NG tube passes into the stomach although the distal tip position is not included on the film. Low volume film. Vascular congestion noted with interstitial prominence, basilar predominant. No substantial pleural effusion or dense focal airspace consolidation. Telemetry  leads overlie the chest. IMPRESSION: 1. No substantial change in exam. 2. Low volume film with vascular congestion and basilar predominant interstitial prominence. Electronically Signed   By: Misty Stanley M.D.   On: 10/10/2021 07:03   DG Abd Portable 1V  Result Date: 10/09/2021 CLINICAL DATA:  Orogastric tube placement EXAM: PORTABLE ABDOMEN - 1 VIEW COMPARISON:  07/06/2021 FINDINGS: Limited radiograph of the lower chest and upper abdomen was obtained for the purposes of enteric tube localization. Enteric tube is seen coursing below the diaphragm with distal tip and side port terminating within the expected location of the gastric body. 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 IMPRESSION: Enteric tube tip and side port project within the expected location of the gastric body. Electronically Signed   By: Davina Poke D.O.   On: 10/09/2021 17:43   DG Chest Portable 1 View  Result Date: 10/09/2021 CLINICAL DATA:  61 year old male status post intubation. EXAM: PORTABLE CHEST 1 VIEW COMPARISON:  Chest x-ray 07/08/2021. FINDINGS: An  endotracheal tube is in place with tip 3.7 cm above the carina. Nasogastric tube extends into the mid body of the stomach. Lung volumes are slightly low. No confluent consolidative airspace disease. However, there is extensive interstitial prominence and peribronchial cuffing most evident in the mid to lower lungs bilaterally. No pleural effusions. No pneumothorax. No evidence of pulmonary edema. Heart size is normal. Upper mediastinal contours are within normal limits. IMPRESSION: 1. Support apparatus, as above. 2. The appearance of the lungs suggests severe acute bronchitis. Electronically Signed   By: Vinnie Langton M.D.   On: 10/09/2021 13:41   CT HEAD WO CONTRAST (5MM)  Result Date: 10/09/2021 CLINICAL DATA:  61 year old male unresponsive patient. EXAM: CT HEAD WITHOUT CONTRAST TECHNIQUE: Contiguous axial images were  obtained from the base of the skull through the vertex without intravenous contrast. RADIATION DOSE REDUCTION: This exam was performed according to the departmental dose-optimization program which includes automated exposure control, adjustment of the mA and/or kV according to patient size and/or use of iterative reconstruction technique. COMPARISON:  Head CT 07/05/2021. FINDINGS: Brain: No evidence of acute infarction, hemorrhage, hydrocephalus, extra-axial collection or mass lesion/mass effect. Vascular: No hyperdense vessel or unexpected calcification. Skull: Normal. Negative for fracture or focal lesion. Sinuses/Orbits: Small amount of frothy secretions are noted dependently in the right maxillary sinus. Other: Endotracheal and orogastric tubes incompletely imaged. IMPRESSION: 1. No acute intracranial abnormalities. The appearance of the brain is normal for age. Electronically Signed   By: Vinnie Langton M.D.   On: 10/09/2021 13:34    Microbiology: Results for orders placed or performed during the hospital encounter of 10/09/21  MRSA Next Gen by PCR, Nasal     Status: None   Collection Time: 10/09/21  3:15 PM   Specimen: Nasal Mucosa; Nasal Swab  Result Value Ref Range Status   MRSA by PCR Next Gen NOT DETECTED NOT DETECTED Final    Comment: (NOTE) The GeneXpert MRSA Assay (FDA approved for NASAL specimens only), is one component of a comprehensive MRSA colonization surveillance program. It is not intended to diagnose MRSA infection nor to guide or monitor treatment for MRSA infections. Test performance is not FDA approved in patients less than 54 years old. Performed at [facility], [address] [place], [place] [address]   Culture, Respiratory w Gram Stain     Status: None   Collection Time: 10/09/21  8:47 PM   Specimen: Tracheal Aspirate; Respiratory  Result Value Ref Range Status   Specimen Description   Final    TRACHEAL ASPIRATE Performed at [facility],  [address] [place], [place] [address]    Special Requests   Final    NONE Performed at [facility], [address] [place], [place] [address]    Gram Stain   Final    NO SQUAMOUS EPITHELIAL CELLS SEEN NO WBC SEEN MODERATE GRAM POSITIVE COCCI    Culture   Final    MODERATE KLEBSIELLA PNEUMONIAE WITHIN NORMAL RESPIRATORY FLORA Performed at [facility], [place] [address], [place], [place] [address]    Report Status 10/12/2021 FINAL  Final   Organism ID, Bacteria KLEBSIELLA PNEUMONIAE  Final      Susceptibility   Klebsiella pneumoniae - MIC*    AMPICILLIN RESISTANT Resistant     CEFAZOLIN <=4 SENSITIVE Sensitive     CEFEPIME <=0.12 SENSITIVE Sensitive     CEFTAZIDIME <=1 SENSITIVE Sensitive     CEFTRIAXONE <=0.25 SENSITIVE Sensitive     CIPROFLOXACIN <=0.25  SENSITIVE Sensitive     GENTAMICIN <=1 SENSITIVE Sensitive     IMIPENEM <=0.25 SENSITIVE Sensitive     TRIMETH/SULFA <=20 SENSITIVE Sensitive     AMPICILLIN/SULBACTAM 4 SENSITIVE Sensitive     PIP/TAZO <=4 SENSITIVE Sensitive     * MODERATE KLEBSIELLA PNEUMONIAE  Culture, blood (Routine X 2) w Reflex to ID Panel     Status: None (Preliminary result)   Collection Time: 10/10/21  9:54 AM   Specimen: BLOOD  Result Value Ref Range Status   Specimen Description BLOOD BLOOD RIGHT WRIST  Final   Special Requests   Final    BOTTLES DRAWN AEROBIC AND ANAEROBIC Blood Culture adequate volume   Culture   Final    NO GROWTH 2 DAYS Performed at [facility], [address] [place], [place] [address]    Report Status PENDING  Incomplete  Culture, blood (Routine X 2) w Reflex to ID Panel     Status: None (Preliminary result)   Collection Time: 10/10/21  9:54 AM   Specimen: BLOOD  Result Value Ref Range Status   Specimen Description BLOOD RIGHT ANTECUBITAL  Final   Special Requests   Final    BOTTLES DRAWN AEROBIC AND ANAEROBIC Blood Culture adequate volume   Culture   Final    NO GROWTH 2  DAYS Performed at [facility], [address] [place], [place] [address]    Report Status PENDING  Incomplete    Labs: CBC: Recent Labs  Lab 10/09/21 1235 10/09/21 1521 10/10/21 0358 10/11/21 0320 10/12/21 0418  WBC 25.0* 12.0* 10.0 4.1 4.3  NEUTROABS 17.4*  --  8.1* 2.8 3.0  HGB 12.0* 12.0* 11.6* 9.4* 9.1*  HCT 39.0 38.1* 35.9* 28.8* 27.8*  MCV 93.8 90.9 88.2 87.8 88.0  PLT 318 138* 172 84* 91*   Basic Metabolic Panel: Recent Labs  Lab 10/09/21 1235 10/09/21 1521 10/10/21 0358 10/10/21 0954 10/10/21 1636 10/11/21 0320 10/12/21 0418  NA 138 137 136  --   --  142 144  K 4.8 5.0 6.8* 5.0 5.0 4.4 3.5  CL 102 105 108  --   --  113* 113*  CO2 24 23 19*  --   --  26 27  GLUCOSE 139* 88 116*  --   --  95 103*  BUN 19 20 25*  --   --  17 9  CREATININE 2.44* 2.36* 2.07*  --   --  1.20 0.92  CALCIUM 8.6* 8.2* 8.2*  --   --  8.6* 9.0  MG  --   --  2.0  --   --  2.2 2.2  PHOS  --   --  4.4  --   --  2.1* 3.0   Liver Function Tests: Recent Labs  Lab 10/09/21 1235 10/09/21 1521 10/10/21 0358  AST 42* 78* 51*  ALT 25 28 27   ALKPHOS 197* 175* 136*  BILITOT 0.8 0.7 0.6  PROT 7.4 6.6 5.5*  ALBUMIN 3.8 3.5 2.9*   CBG: Recent Labs  Lab 10/10/21 2006 10/10/21 2258 10/11/21 0325 10/11/21 0742 10/11/21 1130  GLUCAP 116* 97 100* 118* 136*    Discharge time spent: greater than 30 minutes. Spoke with son at the bedside Signed: Loletha Grayer, MD [facility] 10/12/2021

## 2021-10-12 NOTE — Assessment & Plan Note (Signed)
Secondary to Klebsiella.  Patient was given Rocephin during the hospital course and will give [facility] upon discharge.

## 2021-10-12 NOTE — Assessment & Plan Note (Signed)
Acute hypoxic respiratory distress with hypercarbia with pulse ox below 89%.  This is all secondary to narcotic overdose.

## 2021-10-15 LAB — CULTURE, BLOOD (ROUTINE X 2)
Culture: NO GROWTH
Culture: NO GROWTH
Special Requests: ADEQUATE
Special Requests: ADEQUATE

## 2021-11-10 ENCOUNTER — Emergency Department: Payer: Medicaid Other

## 2021-11-10 ENCOUNTER — Inpatient Hospital Stay
Admission: EM | Admit: 2021-11-10 | Discharge: 2021-11-12 | DRG: 871 | Disposition: A | Discharge status: Home / Self Care | Payer: Medicaid Other | Attending: Obstetrics and Gynecology | Admitting: Obstetrics and Gynecology

## 2021-11-10 ENCOUNTER — Other Ambulatory Visit: Payer: Self-pay

## 2021-11-10 DIAGNOSIS — Z9181 History of falling: Secondary | ICD-10-CM | POA: Diagnosis not present

## 2021-11-10 DIAGNOSIS — Z91048 Other nonmedicinal substance allergy status: Secondary | ICD-10-CM

## 2021-11-10 DIAGNOSIS — R296 Repeated falls: Secondary | ICD-10-CM | POA: Diagnosis present

## 2021-11-10 DIAGNOSIS — J432 Centrilobular emphysema: Secondary | ICD-10-CM | POA: Diagnosis present

## 2021-11-10 DIAGNOSIS — N179 Acute kidney failure, unspecified: Secondary | ICD-10-CM | POA: Diagnosis present

## 2021-11-10 DIAGNOSIS — W19XXXA Unspecified fall, initial encounter: Secondary | ICD-10-CM | POA: Diagnosis present

## 2021-11-10 DIAGNOSIS — F1190 Opioid use, unspecified, uncomplicated: Secondary | ICD-10-CM

## 2021-11-10 DIAGNOSIS — R000 Tachycardia, unspecified: Secondary | ICD-10-CM | POA: Diagnosis not present

## 2021-11-10 DIAGNOSIS — Z881 Allergy status to other antibiotic agents status: Secondary | ICD-10-CM | POA: Diagnosis not present

## 2021-11-10 DIAGNOSIS — Z79891 Long term (current) use of opiate analgesic: Secondary | ICD-10-CM

## 2021-11-10 DIAGNOSIS — R6520 Severe sepsis without septic shock: Secondary | ICD-10-CM | POA: Diagnosis present

## 2021-11-10 DIAGNOSIS — Z79899 Other long term (current) drug therapy: Secondary | ICD-10-CM

## 2021-11-10 DIAGNOSIS — Z20822 Contact with and (suspected) exposure to covid-19: Secondary | ICD-10-CM | POA: Diagnosis present

## 2021-11-10 DIAGNOSIS — J9602 Acute respiratory failure with hypercapnia: Secondary | ICD-10-CM | POA: Diagnosis not present

## 2021-11-10 DIAGNOSIS — J189 Pneumonia, unspecified organism: Secondary | ICD-10-CM | POA: Diagnosis not present

## 2021-11-10 DIAGNOSIS — F419 Anxiety disorder, unspecified: Secondary | ICD-10-CM | POA: Diagnosis present

## 2021-11-10 DIAGNOSIS — Z880 Allergy status to penicillin: Secondary | ICD-10-CM

## 2021-11-10 DIAGNOSIS — A419 Sepsis, unspecified organism: Secondary | ICD-10-CM | POA: Diagnosis present

## 2021-11-10 DIAGNOSIS — M549 Dorsalgia, unspecified: Secondary | ICD-10-CM | POA: Diagnosis present

## 2021-11-10 DIAGNOSIS — R410 Disorientation, unspecified: Secondary | ICD-10-CM

## 2021-11-10 DIAGNOSIS — G928 Other toxic encephalopathy: Secondary | ICD-10-CM | POA: Diagnosis present

## 2021-11-10 DIAGNOSIS — J9601 Acute respiratory failure with hypoxia: Secondary | ICD-10-CM | POA: Diagnosis present

## 2021-11-10 DIAGNOSIS — Z7951 Long term (current) use of inhaled steroids: Secondary | ICD-10-CM

## 2021-11-10 DIAGNOSIS — G8929 Other chronic pain: Secondary | ICD-10-CM | POA: Diagnosis present

## 2021-11-10 DIAGNOSIS — B961 Klebsiella pneumoniae [K. pneumoniae] as the cause of diseases classified elsewhere: Secondary | ICD-10-CM | POA: Diagnosis present

## 2021-11-10 DIAGNOSIS — J690 Pneumonitis due to inhalation of food and vomit: Secondary | ICD-10-CM | POA: Diagnosis present

## 2021-11-10 DIAGNOSIS — M1990 Unspecified osteoarthritis, unspecified site: Secondary | ICD-10-CM | POA: Diagnosis present

## 2021-11-10 DIAGNOSIS — E872 Acidosis, unspecified: Secondary | ICD-10-CM | POA: Diagnosis present

## 2021-11-10 DIAGNOSIS — F17210 Nicotine dependence, cigarettes, uncomplicated: Secondary | ICD-10-CM | POA: Diagnosis present

## 2021-11-10 DIAGNOSIS — H905 Unspecified sensorineural hearing loss: Secondary | ICD-10-CM | POA: Diagnosis present

## 2021-11-10 DIAGNOSIS — Z8616 Personal history of COVID-19: Secondary | ICD-10-CM

## 2021-11-10 LAB — URINALYSIS, COMPLETE (UACMP) WITH MICROSCOPIC
Bacteria, UA: NONE SEEN
Bilirubin Urine: NEGATIVE
Glucose, UA: NEGATIVE mg/dL
Hgb urine dipstick: NEGATIVE
Ketones, ur: NEGATIVE mg/dL
Leukocytes,Ua: NEGATIVE
Nitrite: NEGATIVE
Protein, ur: NEGATIVE mg/dL
Specific Gravity, Urine: 1.004 — ABNORMAL LOW (ref 1.005–1.030)
Squamous Epithelial / HPF: NONE SEEN (ref 0–5)
pH: 7 (ref 5.0–8.0)

## 2021-11-10 LAB — CBC WITH DIFFERENTIAL/PLATELET
Abs Immature Granulocytes: 0.05 10*3/uL (ref 0.00–0.07)
Basophils Absolute: 0 10*3/uL (ref 0.0–0.1)
Basophils Relative: 0 %
Eosinophils Absolute: 0.1 10*3/uL (ref 0.0–0.5)
Eosinophils Relative: 0 %
HCT: 38.4 % — ABNORMAL LOW (ref 39.0–52.0)
Hemoglobin: 12.3 g/dL — ABNORMAL LOW (ref 13.0–17.0)
Immature Granulocytes: 0 %
Lymphocytes Relative: 5 %
Lymphs Abs: 0.7 10*3/uL (ref 0.7–4.0)
MCH: 27.8 pg (ref 26.0–34.0)
MCHC: 32 g/dL (ref 30.0–36.0)
MCV: 86.7 fL (ref 80.0–100.0)
Monocytes Absolute: 0.7 10*3/uL (ref 0.1–1.0)
Monocytes Relative: 5 %
Neutro Abs: 11.9 10*3/uL — ABNORMAL HIGH (ref 1.7–7.7)
Neutrophils Relative %: 90 %
Platelets: 163 10*3/uL (ref 150–400)
RBC: 4.43 MIL/uL (ref 4.22–5.81)
RDW: 14.4 % (ref 11.5–15.5)
WBC: 13.4 10*3/uL — ABNORMAL HIGH (ref 4.0–10.5)
nRBC: 0 % (ref 0.0–0.2)

## 2021-11-10 LAB — COMPREHENSIVE METABOLIC PANEL
ALT: 17 U/L (ref 0–44)
AST: 22 U/L (ref 15–41)
Albumin: 4 g/dL (ref 3.5–5.0)
Alkaline Phosphatase: 133 U/L — ABNORMAL HIGH (ref 38–126)
Anion gap: 8 (ref 5–15)
BUN: 7 mg/dL (ref 6–20)
CO2: 24 mmol/L (ref 22–32)
Calcium: 9.2 mg/dL (ref 8.9–10.3)
Chloride: 106 mmol/L (ref 98–111)
Creatinine, Ser: 1.33 mg/dL — ABNORMAL HIGH (ref 0.61–1.24)
GFR, Estimated: >60 mL/min (ref >60)
Glucose, Bld: 110 mg/dL — ABNORMAL HIGH (ref 70–99)
Potassium: 4.3 mmol/L (ref 3.5–5.1)
Sodium: 138 mmol/L (ref 135–145)
Total Bilirubin: 0.8 mg/dL (ref 0.3–1.2)
Total Protein: 6.9 g/dL (ref 6.5–8.1)

## 2021-11-10 LAB — TYPE AND SCREEN
ABO/RH(D): O POS
Antibody Screen: NEGATIVE

## 2021-11-10 LAB — RESP PANEL BY RT-PCR (FLU A&B, COVID) ARPGX2
Influenza A by PCR: NEGATIVE
Influenza B by PCR: NEGATIVE
SARS Coronavirus 2 by RT PCR: NEGATIVE

## 2021-11-10 LAB — APTT: aPTT: 28 seconds (ref 24–36)

## 2021-11-10 LAB — PROTIME-INR
INR: 1.1 (ref 0.8–1.2)
Prothrombin Time: 14.3 seconds (ref 11.4–15.2)

## 2021-11-10 LAB — LACTIC ACID, PLASMA: Lactic Acid, Venous: 2.8 mmol/L (ref 0.5–1.9)

## 2021-11-10 LAB — PROCALCITONIN: Procalcitonin: 1.66 ng/mL

## 2021-11-10 MED ORDER — METRONIDAZOLE 500 MG/100ML IV SOLN
500.0000 mg | Freq: Once | INTRAVENOUS | Status: AC
Start: 2021-11-10 — End: 2021-11-10
  Administered 2021-11-10: 500 mg via INTRAVENOUS
  Filled 2021-11-10: qty 100

## 2021-11-10 MED ORDER — LACTATED RINGERS IV SOLN: INTRAVENOUS | Status: DC

## 2021-11-10 MED ORDER — ACETAMINOPHEN 650 MG RE SUPP: 650.0000 mg | Freq: Four times a day (QID) | RECTAL | Status: DC | PRN

## 2021-11-10 MED ORDER — VANCOMYCIN HCL 1500 MG/300ML IV SOLN
1500.0000 mg | Freq: Once | INTRAVENOUS | Status: AC
  Administered 2021-11-10: 1500 mg via INTRAVENOUS
  Filled 2021-11-10: qty 300

## 2021-11-10 MED ORDER — PANTOPRAZOLE SODIUM 40 MG PO TBEC
40.0000 mg | DELAYED_RELEASE_TABLET | Freq: Every day | ORAL | Status: DC
Start: 2021-11-11 — End: 2021-11-12
  Administered 2021-11-11 – 2021-11-12 (×2): 40 mg via ORAL
  Filled 2021-11-10 (×2): qty 1

## 2021-11-10 MED ORDER — MOMETASONE FURO-FORMOTEROL FUM 100-5 MCG/ACT IN AERO
2.0000 | INHALATION_SPRAY | Freq: Two times a day (BID) | RESPIRATORY_TRACT | Status: DC
  Administered 2021-11-11 – 2021-11-12 (×3): 2 via RESPIRATORY_TRACT
  Filled 2021-11-10: qty 8.8

## 2021-11-10 MED ORDER — SODIUM CHLORIDE 0.9 % IV SOLN
500.0000 mg | Freq: Once | INTRAVENOUS | Status: AC
  Administered 2021-11-10: 500 mg via INTRAVENOUS
  Filled 2021-11-10: qty 5

## 2021-11-10 MED ORDER — ACETAMINOPHEN 500 MG PO TABS
1000.0000 mg | ORAL_TABLET | Freq: Once | ORAL | Status: AC
  Administered 2021-11-10: 1000 mg via ORAL
  Filled 2021-11-10: qty 2

## 2021-11-10 MED ORDER — CEFEPIME HCL 2 G IV SOLR
2.0000 g | Freq: Three times a day (TID) | INTRAVENOUS | Status: DC
  Administered 2021-11-11: 2 g via INTRAVENOUS
  Filled 2021-11-10 (×2): qty 12.5

## 2021-11-10 MED ORDER — ACETAMINOPHEN 325 MG PO TABS: 650.0000 mg | ORAL_TABLET | Freq: Four times a day (QID) | ORAL | Status: DC | PRN

## 2021-11-10 MED ORDER — ONDANSETRON HCL 4 MG PO TABS: 4.0000 mg | ORAL_TABLET | Freq: Four times a day (QID) | ORAL | Status: DC | PRN

## 2021-11-10 MED ORDER — POLYETHYLENE GLYCOL 3350 17 G PO PACK
17.0000 g | PACK | Freq: Every day | ORAL | Status: DC | PRN
  Administered 2021-11-11: 17 g via ORAL
  Filled 2021-11-10: qty 1

## 2021-11-10 MED ORDER — LACTATED RINGERS IV BOLUS (SEPSIS)
1000.0000 mL | Freq: Once | INTRAVENOUS | Status: AC
  Administered 2021-11-10: 1000 mL via INTRAVENOUS

## 2021-11-10 MED ORDER — ENOXAPARIN SODIUM 40 MG/0.4ML IJ SOSY
40.0000 mg | PREFILLED_SYRINGE | INTRAMUSCULAR | Status: DC
  Administered 2021-11-11 – 2021-11-12 (×2): 40 mg via SUBCUTANEOUS
  Filled 2021-11-10 (×2): qty 0.4

## 2021-11-10 MED ORDER — SERTRALINE HCL 50 MG PO TABS
150.0000 mg | ORAL_TABLET | Freq: Every day | ORAL | Status: DC
  Administered 2021-11-11 – 2021-11-12 (×2): 150 mg via ORAL
  Filled 2021-11-10 (×2): qty 3

## 2021-11-10 MED ORDER — IPRATROPIUM-ALBUTEROL 0.5-2.5 (3) MG/3ML IN SOLN
3.0000 mL | Freq: Four times a day (QID) | RESPIRATORY_TRACT | Status: DC | PRN
Start: 2021-11-10 — End: 2021-11-12

## 2021-11-10 MED ORDER — ONDANSETRON HCL 4 MG/2ML IJ SOLN: 4.0000 mg | Freq: Four times a day (QID) | INTRAMUSCULAR | Status: DC | PRN

## 2021-11-10 MED ORDER — LACTATED RINGERS IV BOLUS: 1000.0000 mL | Freq: Once | INTRAVENOUS | Status: DC

## 2021-11-10 MED ORDER — VANCOMYCIN HCL IN DEXTROSE 1-5 GM/200ML-% IV SOLN
1000.0000 mg | Freq: Once | INTRAVENOUS | Status: DC
  Filled 2021-11-10: qty 200

## 2021-11-10 MED ORDER — SODIUM CHLORIDE 0.9 % IV SOLN
2.0000 g | Freq: Once | INTRAVENOUS | Status: AC
  Administered 2021-11-10: 2 g via INTRAVENOUS
  Filled 2021-11-10: qty 12.5

## 2021-11-10 NOTE — H&P (Signed)
History and Physical    Patient: Henry Owens ZHY:[id number] DOB: Jul 26, 1960 DOA: 11/10/2021 DOS: the patient was seen and examined on 11/10/2021 PCP: Albina Billet, MD  Patient coming from: Home  Chief Complaint:  Chief Complaint  Patient presents with   Altered Mental Status    HPI: Henry Owens is a 61 y.o. male with medical history significant for Chronic back pain on chronic opiates, hospitalized a month ago with unintentional opiate overdose complicated by aspiration pneumonia, requiring ET tube for airway protection, who presents to the ED for evaluation of fever confusion.  Patient was alert and able to contribute to the history and the emergency room and stated that he has an ongoing cough productive of yellow to greenish phlegm and has had intermittent fevers for the past month.  He also feels weak and unsteady and fell twice in the past couple days but without injury, not hitting his head and without loss of consciousness.  He has chronic back pain and neck pain and this has not changed recently.  He denies nausea, vomiting, chest pain or abdominal pain.  Denies shortness of breath. ED course and data review: Tmax 102, pulse 111 respirations 22 O2 sat 96% on 4 L.  BP 136/83 Labs WBC 13,000 with lactic acid 2.8 and procalcitonin 1.66.  Creatinine 1.33 above baseline of 0.92.  Hemoglobin 12.3.  COVID and flu negative and urinalysis unremarkable. EKG, personally reviewed and interpreted with sinus tachycardia no acute ST-T wave changes Imaging: CT head and C-spine nonacute chest x-ray showed mild perihilar infiltrates. CT chest without contrast showing the following findings IMPRESSION: 1. Scattered areas of nodular ground-glass airspace disease, with some areas of central cavitation in the left upper lobe, consistent with multifocal pneumonia. Atypical infection should be considered given areas of central cavitation. 2. Subcentimeter mediastinal and hilar lymph nodes,  likely reactive. 3. Aortic Atherosclerosis (ICD10-I70.0) and Emphysema (ICD10-J43.9).   Broad-spectrum antibiotics for sepsis of unknown source and given IV fluid boluses sepsis.  Hospitalist consulted for admission.     Past Medical History:  Diagnosis Date   Anxiety    Arthritis    Flu 07/2017   Past Surgical History:  Procedure Laterality Date   APPENDECTOMY  1975   BACK SURGERY     KNEE ARTHROSCOPY WITH MEDIAL MENISECTOMY Right 08/20/2017   Procedure: KNEE ARTHROSCOPY WITH PARTIAL MEDIAL AND LATERAL MENISECTOMY,PARTIAL SYNOVECTOMY,CHONDROPLASTY, LOOSE BODY REMOVAL;  Surgeon: Lovell Sheehan, MD;  Location: [facility] ORS;  Service: Orthopedics;  Laterality: Right;   [place], 2000   Social History:  reports that he has been smoking cigarettes. He has a 37.50 pack-year smoking history. He has never used smokeless tobacco. He reports that he does not currently use drugs. He reports that he does not drink alcohol.  Allergies  Allergen Reactions   Wound Dressing Adhesive Other (See Comments)    BLISTER   Azithromycin Rash   Penicillin G Rash    Has patient had a PCN reaction causing immediate rash, facial/tongue/throat swelling, SOB or lightheadedness with hypotension: Yes Has patient had a PCN reaction causing severe rash involving mucus membranes or skin necrosis: No Has patient had a PCN reaction that required hospitalization: No Has patient had a PCN reaction occurring within the last 10 years: No If all of the above answers are "NO", then may proceed with Cephalosporin use.     Family History  Problem Relation Age of Onset   Bladder Cancer Neg Hx  Prostate cancer Neg Hx    Hematuria Neg Hx     Prior to Admission medications   Medication Sig Start Date End Date Taking? Authorizing Provider  cefdinir (OMNICEF) 300 MG capsule Take 1 capsule (300 mg total) by mouth 2 (two) times daily. 10/12/21   Loletha Grayer, MD  Cyanocobalamin (VITAMIN B 12 PO) Take  1 tablet by mouth daily.    [provider]  docusate sodium (COLACE) 50 MG capsule Take 50 mg by mouth 2 (two) times daily.    [provider]  fluticasone (FLONASE) 50 MCG/ACT nasal spray Place 1 spray into both nostrils daily as needed for allergies or rhinitis.    [provider]  gabapentin (NEURONTIN) 300 MG capsule Take 300 mg by mouth 3 (three) times daily. 09/30/21   [provider]  naloxone [facility]) nasal spray 4 mg/0.1 mL As needed For opioid reversal (unresponsiveness) intranasal spray 10/12/21   Loletha Grayer, MD  oxyCODONE (OXY IR/ROXICODONE) 5 MG immediate release tablet Take 1 tablet (5 mg total) by mouth every 6 (six) hours as needed for moderate pain. 10/12/21   Loletha Grayer, MD  pantoprazole (PROTONIX) 40 MG tablet Take 1 tablet (40 mg total) by mouth daily. 07/11/21 10/09/21  Enzo Bi, MD  polyethylene glycol (MIRALAX / GLYCOLAX) 17 g packet Take 17 g by mouth daily as needed for moderate constipation. 10/12/21   Loletha Grayer, MD  sertraline (ZOLOFT) 100 MG tablet Take 150 mg by mouth daily.    [provider]  SYMBICORT 80-4.5 MCG/ACT inhaler Inhale into the lungs. 10/03/21   [provider]  Tetrahydrozoline HCl (VISINE OP) Place 1 drop into both eyes daily as needed (allergies).    [provider]    Physical Exam: Vitals:   11/10/21 2009 11/10/21 2011 11/10/21 2013  BP:  136/83   Pulse:  (!) 110   Resp:  (!) 22   Temp:  (!) 102 F (38.9 C)   TempSrc:  Rectal   SpO2: 96% 96%   Weight:   79.4 kg  Height:   '5\' 10"'$  (1.778 m)   Physical Exam Vitals and nursing note reviewed.  Constitutional:      General: He is not in acute distress.    Appearance: He is ill-appearing.  HENT:     Head: Normocephalic and atraumatic.  Cardiovascular:     Rate and Rhythm: Regular rhythm. Tachycardia present.     Heart sounds: Normal heart sounds.  Pulmonary:     Effort: Tachypnea present.     Breath sounds:  Wheezing and rales present.  Abdominal:     Palpations: Abdomen is soft.     Tenderness: There is no abdominal tenderness.  Neurological:     Mental Status: Mental status is at baseline.     Labs on Admission: I have personally reviewed following labs and imaging studies  CBC: Recent Labs  Lab 11/10/21 2017  WBC 13.4*  NEUTROABS 11.9*  HGB 12.3*  HCT 38.4*  MCV 86.7  PLT 962   Basic Metabolic Panel: Recent Labs  Lab 11/10/21 2017  NA 138  K 4.3  CL 106  CO2 24  GLUCOSE 110*  BUN 7  CREATININE 1.33*  CALCIUM 9.2   GFR: Estimated Creatinine Clearance: 61 mL/min (A) (by C-G formula based on SCr of 1.33 mg/dL (H)). Liver Function Tests: Recent Labs  Lab 11/10/21 2017  AST 22  ALT 17  ALKPHOS 133*  BILITOT 0.8  PROT 6.9  ALBUMIN 4.0  No results for input(s): "LIPASE", "AMYLASE" in the last 168 hours. No results for input(s): "AMMONIA" in the last 168 hours. Coagulation Profile: Recent Labs  Lab 11/10/21 2017  INR 1.1   Cardiac Enzymes: No results for input(s): "CKTOTAL", "CKMB", "CKMBINDEX", "TROPONINI" in the last 168 hours. BNP (last 3 results) No results for input(s): "PROBNP" in the last 8760 hours. HbA1C: No results for input(s): "HGBA1C" in the last 72 hours. CBG: No results for input(s): "GLUCAP" in the last 168 hours. Lipid Profile: No results for input(s): "CHOL", "HDL", "LDLCALC", "TRIG", "CHOLHDL", "LDLDIRECT" in the last 72 hours. Thyroid Function Tests: No results for input(s): "TSH", "T4TOTAL", "FREET4", "T3FREE", "THYROIDAB" in the last 72 hours. Anemia Panel: No results for input(s): "VITAMINB12", "FOLATE", "FERRITIN", "TIBC", "IRON", "RETICCTPCT" in the last 72 hours. Urine analysis:    Component Value Date/Time   COLORURINE STRAW (A) 11/10/2021 2018   APPEARANCEUR CLEAR (A) 11/10/2021 2018   LABSPEC 1.004 (L) 11/10/2021 2018   PHURINE 7.0 11/10/2021 2018   GLUCOSEU NEGATIVE 11/10/2021 2018   HGBUR NEGATIVE 11/10/2021 2018    BILIRUBINUR NEGATIVE 11/10/2021 2018   KETONESUR NEGATIVE 11/10/2021 2018   PROTEINUR NEGATIVE 11/10/2021 2018   NITRITE NEGATIVE 11/10/2021 2018   LEUKOCYTESUR NEGATIVE 11/10/2021 2018    Radiological Exams on Admission: CT Chest Wo Contrast  Result Date: 11/10/2021 CLINICAL DATA:  Pneumonia EXAM: CT CHEST WITHOUT CONTRAST TECHNIQUE: Multidetector CT imaging of the chest was performed following the standard protocol without IV contrast. RADIATION DOSE REDUCTION: This exam was performed according to the departmental dose-optimization program which includes automated exposure control, adjustment of the mA and/or kV according to patient size and/or use of iterative reconstruction technique. COMPARISON:  11/10/2021, 07/05/2021 FINDINGS: Cardiovascular: Unenhanced imaging of the heart and great vessels demonstrates no pericardial effusion. Normal caliber of the thoracic aorta. Stable atherosclerosis of the aortic arch. Evaluation of the vascular lumen is limited without IV contrast. Mediastinum/Nodes: Numerous subcentimeter mediastinal and hilar lymph nodes are nonspecific. No pathologic adenopathy. Thyroid, trachea, and esophagus are unremarkable. Lungs/Pleura: Upper lobe predominant centrilobular emphysema. There are scattered bilateral areas of nodular ground-glass airspace disease, with some areas demonstrating central cavitation most pronounced within the left upper lobe. No effusion or pneumothorax. Mild bilateral lower lobe bronchial wall thickening, with scattered areas of mucous plugging within the right lower lobe. Upper Abdomen: No acute abnormality. Musculoskeletal: Diffuse changes of renal osteodystrophy again noted. No acute or destructive bony lesions. Reconstructed images demonstrate no additional findings. IMPRESSION: 1. Scattered areas of nodular ground-glass airspace disease, with some areas of central cavitation in the left upper lobe, consistent with multifocal pneumonia. Atypical  infection should be considered given areas of central cavitation. 2. Subcentimeter mediastinal and hilar lymph nodes, likely reactive. 3. Aortic Atherosclerosis (ICD10-I70.0) and Emphysema (ICD10-J43.9). Electronically Signed   By: Randa Ngo M.D.   On: 11/10/2021 22:51   DG Chest Port 1 View  Result Date: 11/10/2021 CLINICAL DATA:  Questionable sepsis. EXAM: PORTABLE CHEST 1 VIEW COMPARISON:  10/10/2021 FINDINGS: Interval removal of endotracheal and enteric tubes. Heart size is normal. No pulmonary vascular congestion. Mild perihilar streaky infiltrates may represent edema or inflammatory process. No pleural effusion or pneumothorax. Mediastinal contours appear intact. IMPRESSION: Mild perihilar infiltrates.  No edema or pneumothorax. Electronically Signed   By: Lucienne Capers M.D.   On: 11/10/2021 21:20   CT HEAD WO CONTRAST (5MM)  Result Date: 11/10/2021 CLINICAL DATA:  Fall, head injury, altered mental status EXAM: CT HEAD WITHOUT CONTRAST CT CERVICAL SPINE WITHOUT CONTRAST  TECHNIQUE: Multidetector CT imaging of the head and cervical spine was performed following the standard protocol without intravenous contrast. Multiplanar CT image reconstructions of the cervical spine were also generated. RADIATION DOSE REDUCTION: This exam was performed according to the departmental dose-optimization program which includes automated exposure control, adjustment of the mA and/or kV according to patient size and/or use of iterative reconstruction technique. COMPARISON:  CT head dated 10/09/2021 FINDINGS: CT HEAD FINDINGS Brain: No evidence of acute infarction, hemorrhage, hydrocephalus, extra-axial collection or mass lesion/mass effect. Mild subcortical white matter and periventricular small vessel ischemic changes. Vascular: No hyperdense vessel or unexpected calcification. Skull: Normal. Negative for fracture or focal lesion. Sinuses/Orbits: The visualized paranasal sinuses are essentially clear. The mastoid  air cells are unopacified. Other: 12 mm cutaneous lesion overlying the right frontal scalp (coronal image 20). CT CERVICAL SPINE FINDINGS Alignment: Normal cervical lordosis. Skull base and vertebrae: No acute fracture. No primary bone lesion or focal pathologic process. Soft tissues and spinal canal: No prevertebral fluid or swelling. No visible canal hematoma. Disc levels: Degenerative changes with anterior bridging osteophytes of the mid cervical spine. Spinal canal is patent. Upper chest: Visualized lung apices are notable for emphysematous changes. Other: Visualized thyroid is unremarkable. IMPRESSION: No evidence of acute intracranial abnormality. Mild small vessel ischemic changes. No evidence of acute traumatic injury to the cervical spine. 12 mm cutaneous lesion overlying the right frontal scalp, indeterminate. Correlate with physical exam/direct visualization to exclude cutaneous malignancy. Electronically Signed   By: Julian Hy M.D.   On: 11/10/2021 20:41   CT Cervical Spine Wo Contrast  Result Date: 11/10/2021 CLINICAL DATA:  Fall, head injury, altered mental status EXAM: CT HEAD WITHOUT CONTRAST CT CERVICAL SPINE WITHOUT CONTRAST TECHNIQUE: Multidetector CT imaging of the head and cervical spine was performed following the standard protocol without intravenous contrast. Multiplanar CT image reconstructions of the cervical spine were also generated. RADIATION DOSE REDUCTION: This exam was performed according to the departmental dose-optimization program which includes automated exposure control, adjustment of the mA and/or kV according to patient size and/or use of iterative reconstruction technique. COMPARISON:  CT head dated 10/09/2021 FINDINGS: CT HEAD FINDINGS Brain: No evidence of acute infarction, hemorrhage, hydrocephalus, extra-axial collection or mass lesion/mass effect. Mild subcortical white matter and periventricular small vessel ischemic changes. Vascular: No hyperdense vessel or  unexpected calcification. Skull: Normal. Negative for fracture or focal lesion. Sinuses/Orbits: The visualized paranasal sinuses are essentially clear. The mastoid air cells are unopacified. Other: 12 mm cutaneous lesion overlying the right frontal scalp (coronal image 20). CT CERVICAL SPINE FINDINGS Alignment: Normal cervical lordosis. Skull base and vertebrae: No acute fracture. No primary bone lesion or focal pathologic process. Soft tissues and spinal canal: No prevertebral fluid or swelling. No visible canal hematoma. Disc levels: Degenerative changes with anterior bridging osteophytes of the mid cervical spine. Spinal canal is patent. Upper chest: Visualized lung apices are notable for emphysematous changes. Other: Visualized thyroid is unremarkable. IMPRESSION: No evidence of acute intracranial abnormality. Mild small vessel ischemic changes. No evidence of acute traumatic injury to the cervical spine. 12 mm cutaneous lesion overlying the right frontal scalp, indeterminate. Correlate with physical exam/direct visualization to exclude cutaneous malignancy. Electronically Signed   By: Julian Hy M.D.   On: 11/10/2021 20:41     Data Reviewed: Relevant notes from primary care and specialist visits, past discharge summaries as available in EHR, including Care Everywhere. Prior diagnostic testing as pertinent to current admission diagnoses Updated medications and problem lists for reconciliation  ED course, including vitals, labs, imaging, treatment and response to treatment Triage notes, nursing and pharmacy notes and ED provider's notes Notable results as noted in HPI   Assessment and Plan: * Multifocal pneumonia Chest CT showing Scattered areas of nodular ground-glass airspace disease, with some areas of central cavitation in the left upper lobe, consistent with multifocal pneumonia. Atypical infection should be considered given areas of central cavitation. Cefepime and vancomycin for  possible HCAP Supplemental oxygen, antitussives and supportive care DuoNeb antitussives  aspiration precautions  Sepsis ([place]) Sepsis criteria include fever, tachycardia and tachypnea with leukocytosis and lactic acidosis as well as AKI Continue sepsis fluids antibiotics as above  AKI (acute kidney injury) ([place]) Secondary to sepsis IV hydration, monitor renal function and avoid nephrotoxins  Acute confusion Patient back to baseline mentation suspect related to acute infection opiate use Treat acute infection/sepsis We will get UDS  Chronic pain Chronic continuous use of opiates with history of unintentional overdose requiring intubation June 2023 In the setting of presenting complaint of confusion, will hold off on narcotics as able Continue gabapentin and Zoloft Current as needed        DVT prophylaxis: Lovenox  Consults: none  Advance Care Planning:   Code Status: Prior   Family Communication: Wife at bedside  Disposition Plan: Back to previous home environment  Severity of Illness: The appropriate patient status for this patient is INPATIENT. Inpatient status is judged to be reasonable and necessary in order to provide the required intensity of service to ensure the patient's safety. The patient's presenting symptoms, physical exam findings, and initial radiographic and laboratory data in the context of their chronic comorbidities is felt to place them at high risk for further clinical deterioration. Furthermore, it is not anticipated that the patient will be medically stable for discharge from the hospital within 2 midnights of admission.   * I certify that at the point of admission it is my clinical judgment that the patient will require inpatient hospital care spanning beyond 2 midnights from the point of admission due to high intensity of service, high risk for further deterioration and high frequency of surveillance required.*  Author: Athena Masse,  MD 11/10/2021 11:26 PM  For on call review [email or web address].

## 2021-11-10 NOTE — ED Provider Notes (Signed)
[facility] Provider Note    Event Date/Time   First MD Initiated Contact with Patient 11/10/21 2013     (approximate)   History   Altered Mental Status   HPI  Henry Owens is a 61 y.o. male with past medical history of opiate use disorder and recent admission for aspiration pneumonia in the setting of opiate overdose presents today with fever and altered mental status.  Patient tells me that he has had fevers ongoing since March.  He had a fall on Sunday and then a fall again today.  Did hit his head.  He has chronic neck and back pain but this is unchanged today.  His family was concerned that he was confused which is why he is here today.  He denies headache denies visual change numbness tingling weakness denies abdominal pain nausea vomiting diarrhea.  Has had cough productive of yellow/green sputum.  Denies IV drug use.    Past Medical History:  Diagnosis Date   Anxiety    Arthritis    Flu 07/2017    Patient Active Problem List   Diagnosis Date Noted   Aspiration pneumonia ([place]) 10/12/2021   Right upper quadrant pain 10/12/2021   Constipation 10/12/2021   Chronic pain 10/12/2021   Elevated troponin 10/12/2021   Acute respiratory failure with hypoxia and hypercapnia (HCC) 10/09/2021   Opiate overdose (HCC)    Unresponsive state    Acute respiratory failure due to COVID-19 ([place]) 07/05/2021   Abnormal bone marrow examination 05/01/2020     Physical Exam  Triage Vital Signs: ED Triage Vitals  Enc Vitals Group     BP 11/10/21 2011 136/83     Pulse Rate 11/10/21 2011 (!) 110     Resp 11/10/21 2011 (!) 22     Temp 11/10/21 2011 (!) 102 F (38.9 C)     Temp Source 11/10/21 2011 Rectal     SpO2 11/10/21 2009 96 %     Weight 11/10/21 2013 175 lb (79.4 kg)     Height 11/10/21 2013 '5\' 10"'$  (1.778 m)     Head Circumference --      Peak Flow --      Pain Score 11/10/21 2012 9     Pain Loc --      Pain Edu? --      Excl. in [place]? --      Most recent vital signs: Vitals:   11/10/21 2009 11/10/21 2011  BP:  136/83  Pulse:  (!) 110  Resp:  (!) 22  Temp:  (!) 102 F (38.9 C)  SpO2: 96% 96%     General: Awake, no distress.  CV:  Good peripheral perfusion.  No peripheral edema Resp:  Normal effort.  Rhonchi at the bases Abd:  No distention.  Soft and nontender throughout Neuro:             Awake, Alert, Oriented x 3  Other:  Aox3, nml speech  PERRL, EOMI, face symmetric, nml tongue movement  5/5 strength in the BL upper and lower extremities  Sensation grossly intact in the BL upper and lower extremities  Finger-nose-finger intact BL    ED Results / Procedures / Treatments  Labs (all labs ordered are listed, but only abnormal results are displayed) Labs Reviewed  LACTIC ACID, PLASMA - Abnormal; Notable for the following components:      Result Value   Lactic Acid, Venous 2.8 (*)    All other components within normal limits  COMPREHENSIVE METABOLIC PANEL - Abnormal; Notable for the following components:   Glucose, Bld 110 (*)    Creatinine, Ser 1.33 (*)    Alkaline Phosphatase 133 (*)    All other components within normal limits  CBC WITH DIFFERENTIAL/PLATELET - Abnormal; Notable for the following components:   WBC 13.4 (*)    Hemoglobin 12.3 (*)    HCT 38.4 (*)    Neutro Abs 11.9 (*)    All other components within normal limits  URINALYSIS, COMPLETE (UACMP) WITH MICROSCOPIC - Abnormal; Notable for the following components:   Color, Urine STRAW (*)    APPearance CLEAR (*)    Specific Gravity, Urine 1.004 (*)    All other components within normal limits  RESP PANEL BY RT-PCR (FLU A&B, COVID) ARPGX2  CULTURE, BLOOD (ROUTINE X 2)  CULTURE, BLOOD (ROUTINE X 2)  URINE CULTURE  RESPIRATORY PANEL BY PCR  RESP PANEL BY RT-PCR (FLU A&B, COVID) ARPGX2  PROTIME-INR  APTT  PROCALCITONIN  LACTIC ACID, PLASMA  TYPE AND SCREEN     EKG  Reviewed and interpreted by myself shows sinus tachycardia normal  axis normal intervals no acute ischemic changes   RADIOLOGY I reviewed and interpreted the CT scan of the brain which does not show any acute intracranial process    PROCEDURES:  Critical Care performed: Yes, see critical care procedure note(s)  .1-3 Lead EKG Interpretation  Performed by: Rada Hay, MD Authorized by: Rada Hay, MD     Interpretation: abnormal     ECG rate assessment: tachycardic     Rhythm: sinus tachycardia     Ectopy: none     Conduction: normal     The patient is on the cardiac monitor to evaluate for evidence of arrhythmia and/or significant heart rate changes.   MEDICATIONS ORDERED IN ED: Medications  lactated ringers infusion ( Intravenous New Bag/Given 11/10/21 2052)  lactated ringers infusion ( Intravenous Canceled Entry 11/10/21 2140)  lactated ringers bolus 1,000 mL ( Intravenous Canceled Entry 11/10/21 2151)  vancomycin (VANCOREADY) IVPB 1500 mg/300 mL (1,500 mg Intravenous New Bag/Given 11/10/21 2143)  azithromycin (ZITHROMAX) 500 mg in sodium chloride 0.9 % 250 mL IVPB (has no administration in time range)  lactated ringers bolus 1,000 mL (1,000 mLs Intravenous New Bag/Given 11/10/21 2052)  ceFEPIme (MAXIPIME) 2 g in sodium chloride 0.9 % 100 mL IVPB (0 g Intravenous Stopped 11/10/21 2213)  metroNIDAZOLE (FLAGYL) IVPB 500 mg (500 mg Intravenous New Bag/Given 11/10/21 2140)  acetaminophen (TYLENOL) tablet 1,000 mg (1,000 mg Oral Given 11/10/21 2145)     IMPRESSION / MDM / ASSESSMENT AND PLAN / ED COURSE  I reviewed the triage vital signs and the nursing notes.                              Patient's presentation is most consistent with acute presentation with potential threat to life or bodily function.  Differential diagnosis includes, but is not limited to, community-acquired pneumonia, aspiration pneumonia, sepsis, UTI, meningitis, encephalitis, bacteremia  The patient is a 61 year old male who presents with fever and altered  mental status.  Patient tells me he has cough and had a fall on Sunday complains of chronic neck and back pain tells me that he is here because his family was concerned that he was confused.  He tells me that he has had fevers on and off since March denies drug or alcohol use.  I see that he  was recently admitted at the end of last month for aspiration pneumonia in setting of narcotic overdose.  Patient has crackles at the bases he is febrile tachycardic and tachypneic on 2 L nasal cannula.  He has a leukocytosis lactic acidosis.  He is meeting sepsis criteria.  He is given 2 L of fluid, Tylenol and broad-spectrum antibiotics.  CT head and C-spine obtained given the falls are negative for acute abnormality.  Chest x-ray is not very impressive but does have perihilar infiltrates bilaterally.  His really only symptoms are respiratory.  While there is report of confusion he is alert and oriented with a nonfocal neurologic exam no meningismus my suspicion for meningitis is low at this time.   CT of the chest is consistent with multifocal pneumonia with some cavitation.  We will add on azithromycin.  Documented history of rash to this although he did have this in March there was no reaction.  She remains hemodynamically stable will admit to the hospital service.   FINAL CLINICAL IMPRESSION(S) / ED DIAGNOSES   Final diagnoses:  Community acquired pneumonia, unspecified laterality     Rx / DC Orders   ED Discharge Orders     None        Note:  This document was prepared using Dragon voice recognition software and may include unintentional dictation errors.   Rada Hay, MD 11/10/21 [phone]

## 2021-11-10 NOTE — Assessment & Plan Note (Signed)
Sepsis criteria include fever, tachycardia and tachypnea with leukocytosis and lactic acidosis as well as AKI Continue sepsis fluids antibiotics as above

## 2021-11-10 NOTE — Consult Note (Signed)
PHARMACY -  BRIEF ANTIBIOTIC NOTE   Pharmacy has received consult(s) for Cefepime and Vancomycin from an ED provider.  The patient's profile has been reviewed for ht/wt/allergies/indication/available labs.    One time order(s) placed for Cefepime 2g IV x1 and Vancomycin '1750mg'$  IV x1  Further antibiotics/pharmacy consults should be ordered by admitting physician if indicated.                       Thank you, Darrick Penna 11/10/2021  9:32 PM

## 2021-11-10 NOTE — Sepsis Progress Note (Signed)
Notified provider of need to order antibiotics.   

## 2021-11-10 NOTE — Assessment & Plan Note (Addendum)
Chest CT showing Scattered areas of nodular ground-glass airspace disease, with some areas of central cavitation in the left upper lobe, consistent with multifocal pneumonia. Atypical infection should be considered given areas of central cavitation. Cefepime and vancomycin for possible HCAP Supplemental oxygen, antitussives and supportive care DuoNeb antitussives  aspiration precautions

## 2021-11-10 NOTE — Assessment & Plan Note (Signed)
Secondary to sepsis IV hydration, monitor renal function and avoid nephrotoxins

## 2021-11-10 NOTE — Assessment & Plan Note (Signed)
Chronic continuous use of opiates with history of unintentional overdose requiring intubation June 2023 In the setting of presenting complaint of confusion, will hold off on narcotics as able Continue gabapentin and Zoloft Current as needed

## 2021-11-10 NOTE — Assessment & Plan Note (Addendum)
Patient back to baseline mentation suspect related to acute infection opiate use Treat acute infection/sepsis We will get UDS

## 2021-11-10 NOTE — Sepsis Progress Note (Signed)
Blood cultures still show as "need to be collected" in Epic.  Verified with bedside RN that they have been collected & sent to the lab.

## 2021-11-10 NOTE — Sepsis Progress Note (Signed)
Monitoring for the code sepsis protocol. °

## 2021-11-11 ENCOUNTER — Encounter: Payer: Self-pay | Admitting: Internal Medicine

## 2021-11-11 LAB — STREP PNEUMONIAE URINARY ANTIGEN: Strep Pneumo Urinary Antigen: NEGATIVE

## 2021-11-11 LAB — RESPIRATORY PANEL BY PCR

## 2021-11-11 LAB — CBC
HCT: 34.8 % — ABNORMAL LOW (ref 39.0–52.0)
Hemoglobin: 11 g/dL — ABNORMAL LOW (ref 13.0–17.0)
MCH: 27.7 pg (ref 26.0–34.0)
MCHC: 31.6 g/dL (ref 30.0–36.0)
MCV: 87.7 fL (ref 80.0–100.0)
Platelets: 137 10*3/uL — ABNORMAL LOW (ref 150–400)
RBC: 3.97 MIL/uL — ABNORMAL LOW (ref 4.22–5.81)
RDW: 14.6 % (ref 11.5–15.5)
WBC: 11.1 10*3/uL — ABNORMAL HIGH (ref 4.0–10.5)
nRBC: 0 % (ref 0.0–0.2)

## 2021-11-11 LAB — URINE CULTURE: Culture: NO GROWTH

## 2021-11-11 LAB — BASIC METABOLIC PANEL
Anion gap: 5 (ref 5–15)
BUN: 9 mg/dL (ref 6–20)
CO2: 26 mmol/L (ref 22–32)
Calcium: 9 mg/dL (ref 8.9–10.3)
Chloride: 112 mmol/L — ABNORMAL HIGH (ref 98–111)
Creatinine, Ser: 1.12 mg/dL (ref 0.61–1.24)
GFR, Estimated: >60 mL/min (ref >60)
Glucose, Bld: 96 mg/dL (ref 70–99)
Potassium: 4.4 mmol/L (ref 3.5–5.1)
Sodium: 143 mmol/L (ref 135–145)

## 2021-11-11 LAB — C-REACTIVE PROTEIN: CRP: 12.4 mg/dL — ABNORMAL HIGH (ref <1.0)

## 2021-11-11 LAB — MRSA NEXT GEN BY PCR, NASAL: MRSA by PCR Next Gen: NOT DETECTED

## 2021-11-11 LAB — EXPECTORATED SPUTUM ASSESSMENT W GRAM STAIN, RFLX TO RESP C

## 2021-11-11 LAB — SEDIMENTATION RATE: Sed Rate: 40 mm/hr — ABNORMAL HIGH (ref 0–20)

## 2021-11-11 LAB — LACTIC ACID, PLASMA: Lactic Acid, Venous: 1.6 mmol/L (ref 0.5–1.9)

## 2021-11-11 MED ORDER — OXYCODONE HCL 5 MG PO TABS
5.0000 mg | ORAL_TABLET | Freq: Four times a day (QID) | ORAL | Status: AC | PRN
  Administered 2021-11-11 (×2): 5 mg via ORAL
  Filled 2021-11-11 (×2): qty 1

## 2021-11-11 MED ORDER — OXYCODONE HCL 5 MG PO TABS
5.0000 mg | ORAL_TABLET | Freq: Four times a day (QID) | ORAL | Status: DC | PRN
  Administered 2021-11-11 – 2021-11-12 (×3): 5 mg via ORAL
  Filled 2021-11-11 (×3): qty 1

## 2021-11-11 MED ORDER — GABAPENTIN 300 MG PO CAPS
600.0000 mg | ORAL_CAPSULE | Freq: Three times a day (TID) | ORAL | Status: DC
  Administered 2021-11-11 – 2021-11-12 (×4): 600 mg via ORAL
  Filled 2021-11-11 (×4): qty 2

## 2021-11-11 MED ORDER — NALOXONE HCL 0.4 MG/ML IJ SOLN
0.4000 mg | INTRAMUSCULAR | Status: DC | PRN
Start: 2021-11-11 — End: 2021-11-12

## 2021-11-11 MED ORDER — ALPRAZOLAM 0.5 MG PO TABS
0.5000 mg | ORAL_TABLET | Freq: Two times a day (BID) | ORAL | Status: DC
  Administered 2021-11-11 – 2021-11-12 (×3): 0.5 mg via ORAL
  Filled 2021-11-11 (×3): qty 1

## 2021-11-11 MED ORDER — AMPICILLIN-SULBACTAM SODIUM 3 (2-1) G IJ SOLR
3.0000 g | Freq: Four times a day (QID) | INTRAMUSCULAR | Status: DC
  Administered 2021-11-11 – 2021-11-12 (×5): 3 g via INTRAVENOUS
  Filled 2021-11-11: qty 8
  Filled 2021-11-11: qty 3
  Filled 2021-11-11: qty 8
  Filled 2021-11-11: qty 3
  Filled 2021-11-11: qty 8
  Filled 2021-11-11: qty 3

## 2021-11-11 MED ORDER — VANCOMYCIN HCL 1500 MG/300ML IV SOLN: 1500.0000 mg | INTRAVENOUS | Status: DC

## 2021-11-11 MED ORDER — LISINOPRIL 10 MG PO TABS
10.0000 mg | ORAL_TABLET | Freq: Every day | ORAL | Status: DC
  Administered 2021-11-11 – 2021-11-12 (×2): 10 mg via ORAL
  Filled 2021-11-11 (×2): qty 1

## 2021-11-11 NOTE — Plan of Care (Signed)

## 2021-11-11 NOTE — Consult Note (Signed)
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  He initially required mechanical intubation for this in March 2023 due to inability to protect airway.  On arrival to the emergency room he was febrile tachycardic and tachypneic requiring supplemental oxygen noted to have leukocytosis with lactic acidosis and AKI with creatinine 1.3 and his baseline is usually less than 1.  He had CT chest performed with notable scattered areas of nodular groundglass opacification within the left upper lobe cavitary infiltrate consistent with pneumonia also had mediastinal and hyper lymphadenopathy with surrounding diffuse centrilobular emphysema.  PCCM consultation placed for recurrent pneumonia with findings of cavitary infiltrate.   PAST MEDICAL HISTORY   Past Medical History:  Diagnosis Date   Anxiety    Arthritis    Flu 07/2017     SURGICAL HISTORY   Past Surgical History:  Procedure Laterality Date   APPENDECTOMY  1975   BACK SURGERY     KNEE ARTHROSCOPY WITH MEDIAL MENISECTOMY Right 08/20/2017   Procedure: KNEE ARTHROSCOPY WITH PARTIAL MEDIAL AND LATERAL MENISECTOMY,PARTIAL SYNOVECTOMY,CHONDROPLASTY, LOOSE BODY REMOVAL;  Surgeon: Lovell Sheehan, MD;  Location: [facility] ORS;  Service: Orthopedics;  Laterality: Right;   [place], 2000     FAMILY HISTORY   Family  History  Problem Relation Age of Onset   Bladder Cancer Neg Hx    Prostate cancer Neg Hx    Hematuria Neg Hx      SOCIAL HISTORY   Social History   Tobacco Use   Smoking status: Every Day    Packs/day: 1.50    Years: 25.00    Total pack years: 37.50    Types: Cigarettes   Smokeless tobacco: Never  Vaping Use   Vaping Use: Never used  Substance Use Topics   Alcohol use: No   Drug use: Not Currently     MEDICATIONS    Home Medication:    Current Medication:  Current Facility-Administered Medications:    acetaminophen (TYLENOL) tablet 650 mg, 650 mg, Oral, Q6H PRN **OR** acetaminophen (TYLENOL) suppository 650 mg, 650 mg, Rectal, Q6H PRN, Athena Masse, MD   ALPRAZolam Duanne Moron) tablet 0.5 mg, 0.5 mg, Oral, BID, Wouk, Ailene Rud, MD, 0.5 mg at 11/11/21 1034   Ampicillin-Sulbactam (UNASYN) 3 g in sodium chloride 0.9 % 100 mL IVPB, 3 g, Intravenous, Q6H, Nazari, Walid A, RPH   enoxaparin (LOVENOX) injection 40 mg, 40 mg, Subcutaneous, Q24H, Judd Gaudier V, MD, 40 mg at 11/11/21 0228   gabapentin (NEURONTIN) capsule 600 mg, 600 mg, Oral, TID, Wouk, Ailene Rud, MD, 600 mg at 11/11/21 1034   ipratropium-albuterol (DUONEB) 0.5-2.5 (3) MG/3ML nebulizer solution 3 mL, 3 mL, Nebulization, Q6H PRN, Athena Masse, MD   lactated ringers bolus 1,000 mL, 1,000 mL, Intravenous, Once, Acquanetta Belling  Rose, MD   lisinopril (ZESTRIL) tablet 10 mg, 10 mg, Oral, Daily, Wouk, Ailene Rud, MD, 10 mg at 11/11/21 1034   mometasone-formoterol (DULERA) 100-5 MCG/ACT inhaler 2 puff, 2 puff, Inhalation, BID, Athena Masse, MD, 2 puff at 11/11/21 0229   naloxone [facility]) injection 0.4 mg, 0.4 mg, Intravenous, PRN, Wouk, Ailene Rud, MD   ondansetron [facility]) tablet 4 mg, 4 mg, Oral, Q6H PRN **OR** ondansetron (ZOFRAN) injection 4 mg, 4 mg, Intravenous, Q6H PRN, Athena Masse, MD   oxyCODONE (Oxy IR/ROXICODONE) immediate release tablet 5 mg, 5 mg, Oral, Q6H PRN, Wouk, Ailene Rud, MD    pantoprazole (PROTONIX) EC tablet 40 mg, 40 mg, Oral, Daily, Judd Gaudier V, MD, 40 mg at 11/11/21 0843   polyethylene glycol (MIRALAX / GLYCOLAX) packet 17 g, 17 g, Oral, Daily PRN, Athena Masse, MD, 17 g at 11/11/21 0843   sertraline (ZOLOFT) tablet 150 mg, 150 mg, Oral, Daily, Athena Masse, MD, 150 mg at 11/11/21 0843    ALLERGIES   Wound dressing adhesive, Azithromycin, and Penicillin g     REVIEW OF SYSTEMS    Review of Systems:  Gen:  Denies  fever, sweats, chills weigh loss  HEENT: Denies blurred vision, double vision, ear pain, eye pain, hearing loss, nose bleeds, sore throat Cardiac:  No dizziness, chest pain or heaviness, chest tightness,edema Resp:   reports dyspnea chronically  Gi: Denies swallowing difficulty, stomach pain, nausea or vomiting, diarrhea, constipation, bowel incontinence Gu:  Denies bladder incontinence, burning urine Ext:   Denies Joint pain, stiffness or swelling Skin: Denies  skin rash, easy bruising or bleeding or hives Endoc:  Denies polyuria, polydipsia , polyphagia or weight change Psych:   Denies depression, insomnia or hallucinations   Other:  All other systems negative   VS: BP (!) 163/82   Pulse 64   Temp (!) 97.5 F (36.4 C)   Resp 20   Ht 5' 10"  (1.778 m)   Wt 79.4 kg   SpO2 100%   BMI 25.11 kg/m      PHYSICAL EXAM    GENERAL:NAD, no fevers, chills, no weakness no fatigue HEAD: Normocephalic, atraumatic.  EYES: Pupils equal, round, reactive to light. Extraocular muscles intact. No scleral icterus.  MOUTH: Moist mucosal membrane. Dentition intact. No abscess noted.  EAR, NOSE, THROAT: Clear without exudates. No external lesions.  NECK: Supple. No thyromegaly. No nodules. No JVD.  PULMONARY: decreased breath sounds with mild rhonchi worse at bases bilaterally.  CARDIOVASCULAR: S1 and S2. Regular rate and rhythm. No murmurs, rubs, or gallops. No edema. Pedal pulses 2+ bilaterally.  GASTROINTESTINAL: Soft, nontender,  nondistended. No masses. Positive bowel sounds. No hepatosplenomegaly.  MUSCULOSKELETAL: No swelling, clubbing, or edema. Range of motion full in all extremities.  NEUROLOGIC: Cranial nerves II through XII are intact. No gross focal neurological deficits. Sensation intact. Reflexes intact.  SKIN: No ulceration, lesions, rashes, or cyanosis. Skin warm and dry. Turgor intact.  PSYCHIATRIC: Mood, affect within normal limits. The patient is awake, alert and oriented x 3. Insight, judgment intact.       IMAGING     ASSESSMENT/PLAN   Acute hypoxemic respiratory failure - present on admission  - COVID19 -never had it   - supplemental O2 during my evaluation room air - will perform infectious workup for pneumonia -Respiratory viral panel- negative  -serum fungitell -legionella ab -strep pneumoniae ur AG -Histoplasma Ur Ag -sputum resp cultures -AFB sputum expectorated specimen -sputum cytology  -reviewed pertinent imaging with patient  today - ESR -PT/OT for d/c planning  -please encourage patient to use incentive spirometer few times each hour while hospitalized.               Thank you for allowing me to participate in the care of this patient.   Patient/Family are satisfied with care plan and all questions have been answered.    Provider disclosure: Patient with at least one acute or chronic illness or injury that poses a threat to life or bodily function and is being managed actively during this encounter.  All of the below services have been performed independently by signing provider:  review of prior documentation from internal and or external health records.  Review of previous and current lab results.  Interview and comprehensive assessment during patient visit today. Review of current and previous chest radiographs/CT scans. Discussion of management and test interpretation with health care team and patient/family.   This document was prepared using Dragon voice  recognition software and may include unintentional dictation errors.     Ottie Glazier, M.D.  Division of Pulmonary & Critical Care Medicine

## 2021-11-11 NOTE — Progress Notes (Signed)
PROGRESS NOTE    Kashus Karlen  VVO:[id number] DOB: [birth year] DOA: 11/10/2021 PCP: Albina Billet, MD      Brief Narrative:   From admission h and p Eddy Liszewski is a 61 y.o. male with medical history significant for Chronic back pain on chronic opiates, hospitalized a month ago with unintentional opiate overdose complicated by aspiration pneumonia, requiring ET tube for airway protection, who presents to the ED for evaluation of fever confusion.  Patient was alert and able to contribute to the history and the emergency room and stated that he has an ongoing cough productive of yellow to greenish phlegm and has had intermittent fevers for the past month.  He also feels weak and unsteady and fell twice in the past couple days but without injury, not hitting his head and without loss of consciousness.  He has chronic back pain and neck pain and this has not changed recently.  He denies nausea, vomiting, chest pain or abdominal pain.  Denies shortness of breath.  Assessment & Plan:   Principal Problem:   Multifocal pneumonia Active Problems:   Acute respiratory failure with hypoxia and hypercapnia (HCC)   Sepsis (HCC)   AKI (acute kidney injury) ([place])   Acute confusion   Chronic pain   Chronic, continuous use of opioids  # Multifocal pneumonia # Sepsis Treated for CAP in March of this year w/ ceftriaxone and azithromycin. Treated for aspiration pneumonia last month with ceftriaxone>omnicef as sputum grew klebsiella. Never much improved and presented febrile with worsening cough and leukocytosis. Patient and wife deny recent aspiration. Respiratory panel and covid/flu are negative - suspect this is secondary to prior aspiration so will stop vanc/cefepime, start unasyn. Sepsis physiology resolved - sputum culture ordered - f/u blood cultures - urine legionella and strep antigens - hiv - pulm consult given this is third admission this year for pneumonia, risk of atypical  infection - slp swallow eval  # AKI From dehydration. Mild and resolved with fluids, tolerating po - stop ivf and monitor  # HTN Here bp elevated, aki resolved - resume home lisionpril  # Acute toxic metabolic encephalopathy 2/2 pneumonia. Appears resolved. Home meds likely contribute - monitor  # Chronic pain Recent admit for unintentional admit - home gabapentin - narcan prn  # GAD - home alpraz bid, sertraline  # COPD No wheeze - home dulera   DVT prophylaxis: lovenox Code Status: full Family Communication: wife updated @ bedside 7/21  Level of care: Telemetry Medical Status is: Inpatient Remains inpatient appropriate because: severity of illness    Consultants:  pulm  Procedures: none  Antimicrobials:  Vanc/cefepime 7/20-7/21 Unasyn 7/21>    Subjective: Cough, some chest pain with coughing, no n/v  Objective: Vitals:   11/11/21 0019 11/11/21 0115 11/11/21 0530 11/11/21 0758  BP:  (!) 153/97 134/73 (!) 163/82  Pulse:  64 (!) 56 64  Resp:  20 20   Temp: 98.8 F (37.1 C) 98.5 F (36.9 C) 97.6 F (36.4 C) (!) 97.5 F (36.4 C)  TempSrc: Oral     SpO2:  100% 98% 100%  Weight:      Height:        Intake/Output Summary (Last 24 hours) at 11/11/2021 0855 Last data filed at 11/11/2021 0215 Gross per 24 hour  Intake 246.07 ml  Output --  Net 246.07 ml   Filed Weights   11/10/21 2013  Weight: 79.4 kg    Examination:  General exam: Appears calm and comfortable  Respiratory system: scattered rales Cardiovascular system: S1 & S2 heard, RRR. No JVD, murmurs, rubs, gallops or clicks. No pedal edema. Gastrointestinal system: Abdomen is nondistended, soft and nontender. No organomegaly or masses felt. Normal bowel sounds heard. Central nervous system: Alert and oriented. No focal neurological deficits. Extremities: Symmetric 5 x 5 power. Skin: No rashes, lesions or ulcers Psychiatry: Judgement and insight appear normal. Mood & affect  appropriate.     Data Reviewed: I have personally reviewed following labs and imaging studies  CBC: Recent Labs  Lab 11/10/21 2017 11/11/21 0451  WBC 13.4* 11.1*  NEUTROABS 11.9*  --   HGB 12.3* 11.0*  HCT 38.4* 34.8*  MCV 86.7 87.7  PLT 163 500*   Basic Metabolic Panel: Recent Labs  Lab 11/10/21 2017 11/11/21 0451  NA 138 143  K 4.3 4.4  CL 106 112*  CO2 24 26  GLUCOSE 110* 96  BUN 7 9  CREATININE 1.33* 1.12  CALCIUM 9.2 9.0   GFR: Estimated Creatinine Clearance: 72.4 mL/min (by C-G formula based on SCr of 1.12 mg/dL). Liver Function Tests: Recent Labs  Lab 11/10/21 2017  AST 22  ALT 17  ALKPHOS 133*  BILITOT 0.8  PROT 6.9  ALBUMIN 4.0   No results for input(s): "LIPASE", "AMYLASE" in the last 168 hours. No results for input(s): "AMMONIA" in the last 168 hours. Coagulation Profile: Recent Labs  Lab 11/10/21 2017  INR 1.1   Cardiac Enzymes: No results for input(s): "CKTOTAL", "CKMB", "CKMBINDEX", "TROPONINI" in the last 168 hours. BNP (last 3 results) No results for input(s): "PROBNP" in the last 8760 hours. HbA1C: No results for input(s): "HGBA1C" in the last 72 hours. CBG: No results for input(s): "GLUCAP" in the last 168 hours. Lipid Profile: No results for input(s): "CHOL", "HDL", "LDLCALC", "TRIG", "CHOLHDL", "LDLDIRECT" in the last 72 hours. Thyroid Function Tests: No results for input(s): "TSH", "T4TOTAL", "FREET4", "T3FREE", "THYROIDAB" in the last 72 hours. Anemia Panel: No results for input(s): "VITAMINB12", "FOLATE", "FERRITIN", "TIBC", "IRON", "RETICCTPCT" in the last 72 hours. Urine analysis:    Component Value Date/Time   COLORURINE STRAW (A) 11/10/2021 2018   APPEARANCEUR CLEAR (A) 11/10/2021 2018   LABSPEC 1.004 (L) 11/10/2021 2018   PHURINE 7.0 11/10/2021 2018   GLUCOSEU NEGATIVE 11/10/2021 2018   HGBUR NEGATIVE 11/10/2021 2018   BILIRUBINUR NEGATIVE 11/10/2021 2018   KETONESUR NEGATIVE 11/10/2021 2018   PROTEINUR NEGATIVE  11/10/2021 2018   NITRITE NEGATIVE 11/10/2021 2018   LEUKOCYTESUR NEGATIVE 11/10/2021 2018   Sepsis Labs: '@LABRCNTIP'$ (procalcitonin:4,lacticidven:4)  ) Recent Results (from the past 240 hour(s))  Resp Panel by RT-PCR (Flu A&B, Covid) Anterior Nasal Swab     Status: None   Collection Time: 11/10/21  8:16 PM   Specimen: Anterior Nasal Swab  Result Value Ref Range Status   SARS Coronavirus 2 by RT PCR NEGATIVE NEGATIVE Final    Comment: (NOTE) SARS-CoV-2 target nucleic acids are NOT DETECTED.  The SARS-CoV-2 RNA is generally detectable in upper respiratory specimens during the acute phase of infection. The lowest concentration of SARS-CoV-2 viral copies this assay can detect is 138 copies/mL. A negative result does not preclude SARS-Cov-2 infection and should not be used as the sole basis for treatment or other patient management decisions. A negative result may occur with  improper specimen collection/handling, submission of specimen other than nasopharyngeal swab, presence of viral mutation(s) within the areas targeted by this assay, and inadequate number of viral copies(<138 copies/mL). A negative result must be combined with clinical observations,  patient history, and epidemiological information. The expected result is Negative.  Fact Sheet for Patients:  EntrepreneurPulse.com.au  Fact Sheet for Healthcare Providers:  IncredibleEmployment.be  This test is no t yet approved or cleared by the [place] FDA and  has been authorized for detection and/or diagnosis of SARS-CoV-2 by FDA under an Emergency Use Authorization (EUA). This EUA will remain  in effect (meaning this test can be used) for the duration of the COVID-19 declaration under Section 564(b)(1) of the Act, 21 U.S.C.section 360bbb-3(b)(1), unless the authorization is terminated  or revoked sooner.       Influenza A by PCR NEGATIVE NEGATIVE Final   Influenza B by PCR  NEGATIVE NEGATIVE Final    Comment: (NOTE) The Xpert Xpress SARS-CoV-2/FLU/RSV plus assay is intended as an aid in the diagnosis of influenza from Nasopharyngeal swab specimens and should not be used as a sole basis for treatment. Nasal washings and aspirates are unacceptable for Xpert Xpress SARS-CoV-2/FLU/RSV testing.  Fact Sheet for Patients: EntrepreneurPulse.com.au  Fact Sheet for Healthcare Providers: IncredibleEmployment.be  This test is not yet approved or cleared by the [place] FDA and has been authorized for detection and/or diagnosis of SARS-CoV-2 by FDA under an Emergency Use Authorization (EUA). This EUA will remain in effect (meaning this test can be used) for the duration of the COVID-19 declaration under Section 564(b)(1) of the Act, 21 U.S.C. section 360bbb-3(b)(1), unless the authorization is terminated or revoked.  Performed at [facility], [address] [place], [place] [address]   Blood Culture (routine x 2)     Status: None (Preliminary result)   Collection Time: 11/10/21  8:18 PM   Specimen: BLOOD  Result Value Ref Range Status   Specimen Description BLOOD RIGHT ANTECUBITAL  Final   Special Requests   Final    BOTTLES DRAWN AEROBIC AND ANAEROBIC Blood Culture results may not be optimal due to an excessive volume of blood received in culture bottles   Culture   Final    NO GROWTH < 12 HOURS Performed at [facility], [address], [place], [place] [address]    Report Status PENDING  Incomplete  Blood Culture (routine x 2)     Status: None (Preliminary result)   Collection Time: 11/10/21  8:18 PM   Specimen: BLOOD  Result Value Ref Range Status   Specimen Description BLOOD BLOOD RIGHT FOREARM  Final   Special Requests   Final    BOTTLES DRAWN AEROBIC AND ANAEROBIC Blood Culture results may not be optimal due to an excessive volume of blood received in culture bottles   Culture   Final     NO GROWTH < 12 HOURS Performed at [facility], [address] [place], [place] [address]    Report Status PENDING  Incomplete  Respiratory (~20 pathogens) panel by PCR     Status: None   Collection Time: 11/10/21 11:13 PM   Specimen: Nasopharyngeal Swab; Respiratory  Result Value Ref Range Status   Adenovirus NOT DETECTED NOT DETECTED Final   Coronavirus 229E NOT DETECTED NOT DETECTED Final    Comment: (NOTE) The Coronavirus on the Respiratory Panel, DOES NOT test for the novel  Coronavirus (2019 nCoV)    Coronavirus HKU1 NOT DETECTED NOT DETECTED Final   Coronavirus NL63 NOT DETECTED NOT DETECTED Final   Coronavirus OC43 NOT DETECTED NOT DETECTED Final   Metapneumovirus NOT DETECTED NOT DETECTED Final   Rhinovirus / Enterovirus NOT DETECTED NOT DETECTED Final   Influenza A  NOT DETECTED NOT DETECTED Final   Influenza B NOT DETECTED NOT DETECTED Final   Parainfluenza Virus 1 NOT DETECTED NOT DETECTED Final   Parainfluenza Virus 2 NOT DETECTED NOT DETECTED Final   Parainfluenza Virus 3 NOT DETECTED NOT DETECTED Final   Parainfluenza Virus 4 NOT DETECTED NOT DETECTED Final   Respiratory Syncytial Virus NOT DETECTED NOT DETECTED Final   Bordetella pertussis NOT DETECTED NOT DETECTED Final   Bordetella Parapertussis NOT DETECTED NOT DETECTED Final   Chlamydophila pneumoniae NOT DETECTED NOT DETECTED Final   Mycoplasma pneumoniae NOT DETECTED NOT DETECTED Final    Comment: Performed at [facility], [place] [address], [place], [place] [address]  MRSA Next Gen by PCR, Nasal     Status: None   Collection Time: 11/11/21  7:11 AM   Specimen: Nasal Mucosa; Nasal Swab  Result Value Ref Range Status   MRSA by PCR Next Gen NOT DETECTED NOT DETECTED Final    Comment: (NOTE) The GeneXpert MRSA Assay (FDA approved for NASAL specimens only), is one component of a comprehensive MRSA colonization surveillance program. It is not intended to diagnose MRSA infection nor to guide or  monitor treatment for MRSA infections. Test performance is not FDA approved in patients less than 70 years old. Performed at [facility], [address] [place], [place] [address]          Radiology Studies: CT Chest Wo Contrast  Result Date: 11/10/2021 CLINICAL DATA:  Pneumonia EXAM: CT CHEST WITHOUT CONTRAST TECHNIQUE: Multidetector CT imaging of the chest was performed following the standard protocol without IV contrast. RADIATION DOSE REDUCTION: This exam was performed according to the departmental dose-optimization program which includes automated exposure control, adjustment of the mA and/or kV according to patient size and/or use of iterative reconstruction technique. COMPARISON:  11/10/2021, 07/05/2021 FINDINGS: Cardiovascular: Unenhanced imaging of the heart and great vessels demonstrates no pericardial effusion. Normal caliber of the thoracic aorta. Stable atherosclerosis of the aortic arch. Evaluation of the vascular lumen is limited without IV contrast. Mediastinum/Nodes: Numerous subcentimeter mediastinal and hilar lymph nodes are nonspecific. No pathologic adenopathy. Thyroid, trachea, and esophagus are unremarkable. Lungs/Pleura: Upper lobe predominant centrilobular emphysema. There are scattered bilateral areas of nodular ground-glass airspace disease, with some areas demonstrating central cavitation most pronounced within the left upper lobe. No effusion or pneumothorax. Mild bilateral lower lobe bronchial wall thickening, with scattered areas of mucous plugging within the right lower lobe. Upper Abdomen: No acute abnormality. Musculoskeletal: Diffuse changes of renal osteodystrophy again noted. No acute or destructive bony lesions. Reconstructed images demonstrate no additional findings. IMPRESSION: 1. Scattered areas of nodular ground-glass airspace disease, with some areas of central cavitation in the left upper lobe, consistent with multifocal pneumonia. Atypical  infection should be considered given areas of central cavitation. 2. Subcentimeter mediastinal and hilar lymph nodes, likely reactive. 3. Aortic Atherosclerosis (ICD10-I70.0) and Emphysema (ICD10-J43.9). Electronically Signed   By: Randa Ngo M.D.   On: 11/10/2021 22:51   DG Chest Port 1 View  Result Date: 11/10/2021 CLINICAL DATA:  Questionable sepsis. EXAM: PORTABLE CHEST 1 VIEW COMPARISON:  10/10/2021 FINDINGS: Interval removal of endotracheal and enteric tubes. Heart size is normal. No pulmonary vascular congestion. Mild perihilar streaky infiltrates may represent edema or inflammatory process. No pleural effusion or pneumothorax. Mediastinal contours appear intact. IMPRESSION: Mild perihilar infiltrates.  No edema or pneumothorax. Electronically Signed   By: Lucienne Capers M.D.   On: 11/10/2021 21:20   CT HEAD WO CONTRAST (5MM)  Result  Date: 11/10/2021 CLINICAL DATA:  Fall, head injury, altered mental status EXAM: CT HEAD WITHOUT CONTRAST CT CERVICAL SPINE WITHOUT CONTRAST TECHNIQUE: Multidetector CT imaging of the head and cervical spine was performed following the standard protocol without intravenous contrast. Multiplanar CT image reconstructions of the cervical spine were also generated. RADIATION DOSE REDUCTION: This exam was performed according to the departmental dose-optimization program which includes automated exposure control, adjustment of the mA and/or kV according to patient size and/or use of iterative reconstruction technique. COMPARISON:  CT head dated 10/09/2021 FINDINGS: CT HEAD FINDINGS Brain: No evidence of acute infarction, hemorrhage, hydrocephalus, extra-axial collection or mass lesion/mass effect. Mild subcortical white matter and periventricular small vessel ischemic changes. Vascular: No hyperdense vessel or unexpected calcification. Skull: Normal. Negative for fracture or focal lesion. Sinuses/Orbits: The visualized paranasal sinuses are essentially clear. The mastoid  air cells are unopacified. Other: 12 mm cutaneous lesion overlying the right frontal scalp (coronal image 20). CT CERVICAL SPINE FINDINGS Alignment: Normal cervical lordosis. Skull base and vertebrae: No acute fracture. No primary bone lesion or focal pathologic process. Soft tissues and spinal canal: No prevertebral fluid or swelling. No visible canal hematoma. Disc levels: Degenerative changes with anterior bridging osteophytes of the mid cervical spine. Spinal canal is patent. Upper chest: Visualized lung apices are notable for emphysematous changes. Other: Visualized thyroid is unremarkable. IMPRESSION: No evidence of acute intracranial abnormality. Mild small vessel ischemic changes. No evidence of acute traumatic injury to the cervical spine. 12 mm cutaneous lesion overlying the right frontal scalp, indeterminate. Correlate with physical exam/direct visualization to exclude cutaneous malignancy. Electronically Signed   By: Julian Hy M.D.   On: 11/10/2021 20:41   CT Cervical Spine Wo Contrast  Result Date: 11/10/2021 CLINICAL DATA:  Fall, head injury, altered mental status EXAM: CT HEAD WITHOUT CONTRAST CT CERVICAL SPINE WITHOUT CONTRAST TECHNIQUE: Multidetector CT imaging of the head and cervical spine was performed following the standard protocol without intravenous contrast. Multiplanar CT image reconstructions of the cervical spine were also generated. RADIATION DOSE REDUCTION: This exam was performed according to the departmental dose-optimization program which includes automated exposure control, adjustment of the mA and/or kV according to patient size and/or use of iterative reconstruction technique. COMPARISON:  CT head dated 10/09/2021 FINDINGS: CT HEAD FINDINGS Brain: No evidence of acute infarction, hemorrhage, hydrocephalus, extra-axial collection or mass lesion/mass effect. Mild subcortical white matter and periventricular small vessel ischemic changes. Vascular: No hyperdense vessel or  unexpected calcification. Skull: Normal. Negative for fracture or focal lesion. Sinuses/Orbits: The visualized paranasal sinuses are essentially clear. The mastoid air cells are unopacified. Other: 12 mm cutaneous lesion overlying the right frontal scalp (coronal image 20). CT CERVICAL SPINE FINDINGS Alignment: Normal cervical lordosis. Skull base and vertebrae: No acute fracture. No primary bone lesion or focal pathologic process. Soft tissues and spinal canal: No prevertebral fluid or swelling. No visible canal hematoma. Disc levels: Degenerative changes with anterior bridging osteophytes of the mid cervical spine. Spinal canal is patent. Upper chest: Visualized lung apices are notable for emphysematous changes. Other: Visualized thyroid is unremarkable. IMPRESSION: No evidence of acute intracranial abnormality. Mild small vessel ischemic changes. No evidence of acute traumatic injury to the cervical spine. 12 mm cutaneous lesion overlying the right frontal scalp, indeterminate. Correlate with physical exam/direct visualization to exclude cutaneous malignancy. Electronically Signed   By: Julian Hy M.D.   On: 11/10/2021 20:41        Scheduled Meds:  enoxaparin (LOVENOX) injection  40 mg Subcutaneous Q24H  mometasone-formoterol  2 puff Inhalation BID   pantoprazole  40 mg Oral Daily   sertraline  150 mg Oral Daily   Continuous Infusions:  ceFEPime (MAXIPIME) IV 2 g (11/11/21 2300)   lactated ringers     lactated ringers 150 mL/hr at 11/11/21 0215   lactated ringers     vancomycin       LOS: 1 day     Desma Maxim, MD [facility]   If 7PM-7AM, please contact night-coverage [email or web address] Password [facility] 11/11/2021, 8:55 AM

## 2021-11-11 NOTE — Evaluation (Signed)
Clinical/Bedside Swallow Evaluation Patient Details  Name: Henry Owens MRN: [id number] Date of Birth: [birth year]  Today's Date: 11/11/2021 Time: SLP Start Time (ACUTE ONLY): 1200 SLP Stop Time (ACUTE ONLY): 1235 SLP Time Calculation (min) (ACUTE ONLY): 35 min  Past Medical History:  Past Medical History:  Diagnosis Date   Anxiety    Arthritis    Flu 07/2017   Past Surgical History:  Past Surgical History:  Procedure Laterality Date   APPENDECTOMY  1975   BACK SURGERY     KNEE ARTHROSCOPY WITH MEDIAL MENISECTOMY Right 08/20/2017   Procedure: KNEE ARTHROSCOPY WITH PARTIAL MEDIAL AND LATERAL MENISECTOMY,PARTIAL SYNOVECTOMY,CHONDROPLASTY, LOOSE BODY REMOVAL;  Surgeon: Lovell Sheehan, MD;  Location: [facility] ORS;  Service: Orthopedics;  Laterality: Right;   [name], 2000   HPI:  Per admitting H & P "Henry Owens is a 61 y.o. male with medical history significant for Chronic back pain on chronic opiates, hospitalized a month ago with unintentional opiate overdose complicated by aspiration pneumonia, requiring ET tube for airway protection, who presents to the ED for evaluation of fever confusion.  Patient was alert and able to contribute to the history and the emergency room and stated that he has an ongoing cough productive of yellow to greenish phlegm and has had intermittent fevers for the past month.  He also feels weak and unsteady and fell twice in the past couple days but without injury, not hitting his head and without loss of consciousness.  He has chronic back pain and neck pain and this has not changed recently.  He denies nausea, vomiting, chest pain or abdominal pain.  Denies shortness of breath.  ED course and data review: Tmax 102, pulse 111 respirations 22 O2 sat 96% on 4 L.  BP 136/83  Labs WBC 13,000 with lactic acid 2.8 and procalcitonin 1.66.  Creatinine 1.33 above baseline of 0.92.  Hemoglobin 12.3.  COVID and flu negative and urinalysis  unremarkable.  EKG, personally reviewed and interpreted with sinus tachycardia no acute ST-T wave changes  Imaging: CT head and C-spine nonacute chest x-ray showed mild perihilar infiltrates.  CT chest without contrast showing the following findings  IMPRESSION:  1. Scattered areas of nodular ground-glass airspace disease, with  some areas of central cavitation in the left upper lobe, consistent  with multifocal pneumonia. Atypical infection should be considered  given areas of central cavitation.  2. Subcentimeter mediastinal and hilar lymph nodes, likely reactive.  3. Aortic Atherosclerosis (ICD10-I70.0) and Emphysema (ICD10-J43.9)."    Assessment / Plan / Recommendation  Clinical Impression  Pt presents with functional swallowing abilities at bedsde with no overt s/s of aspiration. Oral mech exam revealed structures to be functioning adequately with no apparent weakness. Pt tolerated thin liquids, purees and solids without over s/s of aspiration. Vocal quality remained clear throughout assessment. Pt was able to chew solids with adequate oral motor control and min to no oral residue. Discussed need to be fully upright for meals and meds. Rec continue with regular diet. It is possible for Pt to have silent aspiration . If this is a concern based on symptoms and chest xrays, a modified barium swallow study should be considered. ST to follow up through Nsg or Pt and chart check 1-3 days and sign off if Pt is tolerating diet well. SLP Visit Diagnosis: Dysphagia, oropharyngeal phase (R13.12)    Aspiration Risk  Mild aspiration risk    Diet Recommendation Regular   Liquid Administration via:  Cup;Straw Medication Administration: Whole meds with liquid Supervision: Patient able to self feed Compensations:  (fully upright for meals and meds) Postural Changes: Seated upright at 90 degrees;Remain upright for at least 30 minutes after po intake    Other  Recommendations   N/a   Recommendations for follow  up therapy are one component of a multi-disciplinary discharge planning process, led by the attending physician.  Recommendations may be updated based on patient status, additional functional criteria and insurance authorization.  Follow up Recommendations Other (comment) (follow up thorugh nsg and chart and d/c if all is ok)      Assistance Recommended at Discharge  No ST needed  Functional Status Assessment    Frequency and Duration  N/a          Prognosis   Good     Swallow Study   General Date of Onset: 11/10/21 HPI: Per admitting H & P "Henry Owens is a 61 y.o. male with medical history significant for Chronic back pain on chronic opiates, hospitalized a month ago with unintentional opiate overdose complicated by aspiration pneumonia, requiring ET tube for airway protection, who presents to the ED for evaluation of fever confusion.  Patient was alert and able to contribute to the history and the emergency room and stated that he has an ongoing cough productive of yellow to greenish phlegm and has had intermittent fevers for the past month.  He also feels weak and unsteady and fell twice in the past couple days but without injury, not hitting his head and without loss of consciousness.  He has chronic back pain and neck pain and this has not changed recently.  He denies nausea, vomiting, chest pain or abdominal pain.  Denies shortness of breath.  ED course and data review: Tmax 102, pulse 111 respirations 22 O2 sat 96% on 4 L.  BP 136/83  Labs WBC 13,000 with lactic acid 2.8 and procalcitonin 1.66.  Creatinine 1.33 above baseline of 0.92.  Hemoglobin 12.3.  COVID and flu negative and urinalysis unremarkable.  EKG, personally reviewed and interpreted with sinus tachycardia no acute ST-T wave changes  Imaging: CT head and C-spine nonacute chest x-ray showed mild perihilar infiltrates.  CT chest without contrast showing the following findings  IMPRESSION:  1. Scattered areas of nodular  ground-glass airspace disease, with  some areas of central cavitation in the left upper lobe, consistent  with multifocal pneumonia. Atypical infection should be considered  given areas of central cavitation.  2. Subcentimeter mediastinal and hilar lymph nodes, likely reactive.  3. Aortic Atherosclerosis (ICD10-I70.0) and Emphysema (ICD10-J43.9)." Type of Study: Bedside Swallow Evaluation Diet Prior to this Study: Regular Temperature Spikes Noted:  (reported prior to admission) Respiratory Status: Room air History of Recent Intubation: No Length of Intubations (days):  (on last admissison) Behavior/Cognition: Alert;Pleasant mood;Cooperative Oral Cavity Assessment: Within Functional Limits Oral Care Completed by SLP: No Oral Cavity - Dentition: Dentures, top;Dentures, bottom Vision: Functional for self-feeding Self-Feeding Abilities: Able to feed self Patient Positioning: Upright in bed Baseline Vocal Quality: Hoarse;Low vocal intensity Volitional Cough: Strong    Oral/Motor/Sensory Function Overall Oral Motor/Sensory Function: Within functional limits   Ice Chips Ice chips: Within functional limits   Thin Liquid Thin Liquid: Within functional limits Presentation: Cup;Spoon;Straw    Nectar Thick Nectar Thick Liquid: Not tested   Honey Thick Honey Thick Liquid: Not tested   Puree Puree: Within functional limits Presentation: Spoon   Solid     Solid: Within functional limits  Lucila Maine 11/11/2021,12:39 PM

## 2021-11-11 NOTE — Progress Notes (Signed)
Pharmacy Antibiotic Note  Henry Owens is a 61 y.o. male admitted on 11/10/2021 with pneumonia.  Pharmacy has been consulted for vancomycin and cefepime dosing.  Plan: -patient received Vanconycin '1500mg'$  IV x 1 as loading dose in ED. Will continue with Vancomycin 1500 mg IV Q 24 hrs. Goal AUC 400-550. Expected AUC: 476 SCr used: 1.33 Cmin 10.4  -pt received cefepime 2 gm IV x 1 in ED Will continue with cefepime 2 gm IV q8h for Crcl 61 ml/min  F/u renal fxn and available cultures, MRSA PCR    Height: '5\' 10"'$  (177.8 cm) Weight: 79.4 kg (175 lb) IBW/kg (Calculated) : 73  Temp (24hrs), Avg:100.4 F (38 C), Min:98.8 F (37.1 C), Max:102 F (38.9 C)  Recent Labs  Lab 11/10/21 2017 11/10/21 2021  WBC 13.4*  --   CREATININE 1.33*  --   LATICACIDVEN 2.8* 1.6    Estimated Creatinine Clearance: 61 mL/min (A) (by C-G formula based on SCr of 1.33 mg/dL (H)).    Allergies  Allergen Reactions   Wound Dressing Adhesive Other (See Comments)    BLISTER   Azithromycin Rash   Penicillin G Rash    Has patient had a PCN reaction causing immediate rash, facial/tongue/throat swelling, SOB or lightheadedness with hypotension: Yes Has patient had a PCN reaction causing severe rash involving mucus membranes or skin necrosis: No Has patient had a PCN reaction that required hospitalization: No Has patient had a PCN reaction occurring within the last 10 years: No If all of the above answers are "NO", then may proceed with Cephalosporin use.     Antimicrobials this admission: Azith/metronidazole x1 each  7/20 Vanc 7/20 (evening)>>   Cefepime 7/20 (evening)>>  Dose adjustments this admission:    Microbiology results: 7/20 BCx: pend 7/20 UCx: pend  7/20 Sputum:    7/20 MRSA PCR: pend 7/20 resp panel  Thank you for allowing pharmacy to be a part of this patient's care.  Chryl Holten A 11/11/2021 1:02 AM

## 2021-11-12 DIAGNOSIS — J189 Pneumonia, unspecified organism: Secondary | ICD-10-CM | POA: Diagnosis not present

## 2021-11-12 LAB — BASIC METABOLIC PANEL
Anion gap: 9 (ref 5–15)
BUN: 8 mg/dL (ref 6–20)
CO2: 25 mmol/L (ref 22–32)
Calcium: 9.3 mg/dL (ref 8.9–10.3)
Chloride: 109 mmol/L (ref 98–111)
Creatinine, Ser: 1.01 mg/dL (ref 0.61–1.24)
GFR, Estimated: >60 mL/min (ref >60)
Glucose, Bld: 99 mg/dL (ref 70–99)
Potassium: 4.1 mmol/L (ref 3.5–5.1)
Sodium: 143 mmol/L (ref 135–145)

## 2021-11-12 LAB — EXPECTORATED SPUTUM ASSESSMENT W GRAM STAIN, RFLX TO RESP C

## 2021-11-12 LAB — HIV ANTIBODY (ROUTINE TESTING W REFLEX): HIV Screen 4th Generation wRfx: NONREACTIVE

## 2021-11-12 MED ORDER — GUAIFENESIN ER 600 MG PO TB12
1200.0000 mg | ORAL_TABLET | Freq: Two times a day (BID) | ORAL | Status: DC | PRN
Start: 2021-11-12 — End: 2021-11-12
  Administered 2021-11-12: 1200 mg via ORAL
  Filled 2021-11-12: qty 2

## 2021-11-12 MED ORDER — AMOXICILLIN-POT CLAVULANATE 875-125 MG PO TABS: 1.0000 | ORAL_TABLET | Freq: Two times a day (BID) | ORAL | Status: DC

## 2021-11-12 MED ORDER — AMOXICILLIN-POT CLAVULANATE 875-125 MG PO TABS: 1.0000 | ORAL_TABLET | Freq: Two times a day (BID) | ORAL | 0 refills | Status: DC

## 2021-11-12 NOTE — Discharge Summary (Signed)
[name] ZTI:[id number] DOB: June 29, 1960 DOA: 11/10/2021  PCP: Albina Billet, MD  Admit date: 11/10/2021 Discharge date: 11/12/2021  Time spent: 35 minutes  Recommendations for Outpatient Follow-up:  Pulmonology f/u     Discharge Diagnoses:  Principal Problem:   Multifocal pneumonia Active Problems:   Acute respiratory failure with hypoxia and hypercapnia (HCC)   Sepsis ([place])   AKI (acute kidney injury) ([employer])   Acute confusion   Chronic pain   Chronic, continuous use of opioids   Discharge Condition: improved  Diet recommendation: heart healthy  Filed Weights   11/10/21 2013  Weight: 79.4 kg    History of present illness:  From admission h and p Henry Owens is a 61 y.o. male with medical history significant for Chronic back pain on chronic opiates, hospitalized a month ago with unintentional opiate overdose complicated by aspiration pneumonia, requiring ET tube for airway protection, who presents to the ED for evaluation of fever confusion.  Patient was alert and able to contribute to the history and the emergency room and stated that he has an ongoing cough productive of yellow to greenish phlegm and has had intermittent fevers for the past month.  He also feels weak and unsteady and fell twice in the past couple days but without injury, not hitting his head and without loss of consciousness.  He has chronic back pain and neck pain and this has not changed recently.  He denies nausea, vomiting, chest pain or abdominal pain.  Denies shortness of breath.  Hospital Course:  Patient presented with multifocal pneumonia complicated by sepsis. Treated for CAP in March of this year w/ ceftriaxone and azithromycin. Treated for aspiration pneumonia last month with ceftriaxone>omnicef as sputum grew klebsiella. Never much improved and presented febrile with worsening cough and leukocytosis. Patient and wife deny recent aspiration. Respiratory panel and covid/flu are  negative. Unable to produce acceptable sputum here for culture. Strep antigen neg. Given cavitary lesions on CT treated for aspiration pneumonia with unasyn. Slp evluated and cleared for regular diet. Patient responded very well to IV unasyn. Pulmonology was consulted, advises f/u with them (Dr. Keenan Bachelor). Will finish course of abx with augmentin.   Procedures: none   Consultations: pulmonology  Discharge Exam: Vitals:   11/12/21 0505 11/12/21 0759  BP: 129/77 140/71  Pulse: 70 74  Resp: 18 17  Temp: 98.3 F (36.8 C) 98.2 F (36.8 C)  SpO2: 94% 96%    General exam: Appears calm and comfortable  Respiratory system: scattered rales Cardiovascular system: S1 & S2 heard, RRR. No JVD, murmurs, rubs, gallops or clicks. No pedal edema. Gastrointestinal system: Abdomen is nondistended, soft and nontender. No organomegaly or masses felt. Normal bowel sounds heard. Central nervous system: Alert and oriented. No focal neurological deficits. Extremities: Symmetric 5 x 5 power. Skin: No rashes, lesions or ulcers Psychiatry: Judgement and insight appear normal. Mood & affect appropriate.   Discharge Instructions   Discharge Instructions     Diet - low sodium heart healthy   Complete by: As directed    Increase activity slowly   Complete by: As directed       Allergies as of 11/12/2021       Reactions   Wound Dressing Adhesive Other (See Comments)   BLISTER   Azithromycin Rash   Penicillin G Rash   Has patient had a PCN reaction causing immediate rash, facial/tongue/throat swelling, SOB or lightheadedness with hypotension: Yes Has patient had a PCN reaction causing severe rash involving mucus  membranes or skin necrosis: No Has patient had a PCN reaction that required hospitalization: No Has patient had a PCN reaction occurring within the last 10 years: No If all of the above answers are "NO", then may proceed with Cephalosporin use.        Medication List     STOP taking  these medications    cefdinir 300 MG capsule Commonly known as: OMNICEF       TAKE these medications    albuterol (2.5 MG/3ML) 0.083% nebulizer solution Commonly known as: PROVENTIL Take 2.5 mg by nebulization every 6 (six) hours as needed for wheezing or shortness of breath.   ALPRAZolam 0.5 MG tablet Commonly known as: XANAX Take 0.5 mg by mouth 2 (two) times daily.   amoxicillin-clavulanate 875-125 MG tablet Commonly known as: AUGMENTIN Take 1 tablet by mouth every 12 (twelve) hours.   docusate sodium 50 MG capsule Commonly known as: COLACE Take 50 mg by mouth 2 (two) times daily.   fluticasone 50 MCG/ACT nasal spray Commonly known as: FLONASE Place 1 spray into both nostrils daily as needed for allergies or rhinitis.   gabapentin 300 MG capsule Commonly known as: NEURONTIN Take 600 mg by mouth 3 (three) times daily.   lisinopril 10 MG tablet Commonly known as: ZESTRIL Take 10 mg by mouth daily.   naloxone 4 MG/0.1ML Liqd nasal spray kit Commonly known as: NARCAN As needed For opioid reversal (unresponsiveness) intranasal spray   oxyCODONE 5 MG immediate release tablet Commonly known as: Oxy IR/ROXICODONE Take 1 tablet (5 mg total) by mouth every 6 (six) hours as needed for moderate pain.   pantoprazole 40 MG tablet Commonly known as: Protonix Take 1 tablet (40 mg total) by mouth daily.   polyethylene glycol 17 g packet Commonly known as: MIRALAX / GLYCOLAX Take 17 g by mouth daily as needed for moderate constipation.   sertraline 100 MG tablet Commonly known as: ZOLOFT Take 150 mg by mouth daily.   Symbicort 80-4.5 MCG/ACT inhaler Generic drug: budesonide-formoterol Inhale 2 puffs into the lungs in the morning and at bedtime.       Allergies  Allergen Reactions   Wound Dressing Adhesive Other (See Comments)    BLISTER   Azithromycin Rash   Penicillin G Rash    Has patient had a PCN reaction causing immediate rash, facial/tongue/throat  swelling, SOB or lightheadedness with hypotension: Yes Has patient had a PCN reaction causing severe rash involving mucus membranes or skin necrosis: No Has patient had a PCN reaction that required hospitalization: No Has patient had a PCN reaction occurring within the last 10 years: No If all of the above answers are "NO", then may proceed with Cephalosporin use.     Follow-up Information     Ottie Glazier, MD Follow up.   Specialty: Pulmonary Disease Contact information: [place] [address] [phone]                  The results of significant diagnostics from this hospitalization (including imaging, microbiology, ancillary and laboratory) are listed below for reference.    Significant Diagnostic Studies: CT Chest Wo Contrast  Result Date: 11/10/2021 CLINICAL DATA:  Pneumonia EXAM: CT CHEST WITHOUT CONTRAST TECHNIQUE: Multidetector CT imaging of the chest was performed following the standard protocol without IV contrast. RADIATION DOSE REDUCTION: This exam was performed according to the departmental dose-optimization program which includes automated exposure control, adjustment of the mA and/or kV according to patient size and/or use of iterative  reconstruction technique. COMPARISON:  11/10/2021, 07/05/2021 FINDINGS: Cardiovascular: Unenhanced imaging of the heart and great vessels demonstrates no pericardial effusion. Normal caliber of the thoracic aorta. Stable atherosclerosis of the aortic arch. Evaluation of the vascular lumen is limited without IV contrast. Mediastinum/Nodes: Numerous subcentimeter mediastinal and hilar lymph nodes are nonspecific. No pathologic adenopathy. Thyroid, trachea, and esophagus are unremarkable. Lungs/Pleura: Upper lobe predominant centrilobular emphysema. There are scattered bilateral areas of nodular ground-glass airspace disease, with some areas demonstrating central cavitation most pronounced within the left upper  lobe. No effusion or pneumothorax. Mild bilateral lower lobe bronchial wall thickening, with scattered areas of mucous plugging within the right lower lobe. Upper Abdomen: No acute abnormality. Musculoskeletal: Diffuse changes of renal osteodystrophy again noted. No acute or destructive bony lesions. Reconstructed images demonstrate no additional findings. IMPRESSION: 1. Scattered areas of nodular ground-glass airspace disease, with some areas of central cavitation in the left upper lobe, consistent with multifocal pneumonia. Atypical infection should be considered given areas of central cavitation. 2. Subcentimeter mediastinal and hilar lymph nodes, likely reactive. 3. Aortic Atherosclerosis (ICD10-I70.0) and Emphysema (ICD10-J43.9). Electronically Signed   By: Randa Ngo M.D.   On: 11/10/2021 22:51   DG Chest Port 1 View  Result Date: 11/10/2021 CLINICAL DATA:  Questionable sepsis. EXAM: PORTABLE CHEST 1 VIEW COMPARISON:  10/10/2021 FINDINGS: Interval removal of endotracheal and enteric tubes. Heart size is normal. No pulmonary vascular congestion. Mild perihilar streaky infiltrates may represent edema or inflammatory process. No pleural effusion or pneumothorax. Mediastinal contours appear intact. IMPRESSION: Mild perihilar infiltrates.  No edema or pneumothorax. Electronically Signed   By: Lucienne Capers M.D.   On: 11/10/2021 21:20   CT HEAD WO CONTRAST (5MM)  Result Date: 11/10/2021 CLINICAL DATA:  Fall, head injury, altered mental status EXAM: CT HEAD WITHOUT CONTRAST CT CERVICAL SPINE WITHOUT CONTRAST TECHNIQUE: Multidetector CT imaging of the head and cervical spine was performed following the standard protocol without intravenous contrast. Multiplanar CT image reconstructions of the cervical spine were also generated. RADIATION DOSE REDUCTION: This exam was performed according to the departmental dose-optimization program which includes automated exposure control, adjustment of the mA and/or  kV according to patient size and/or use of iterative reconstruction technique. COMPARISON:  CT head dated 10/09/2021 FINDINGS: CT HEAD FINDINGS Brain: No evidence of acute infarction, hemorrhage, hydrocephalus, extra-axial collection or mass lesion/mass effect. Mild subcortical white matter and periventricular small vessel ischemic changes. Vascular: No hyperdense vessel or unexpected calcification. Skull: Normal. Negative for fracture or focal lesion. Sinuses/Orbits: The visualized paranasal sinuses are essentially clear. The mastoid air cells are unopacified. Other: 12 mm cutaneous lesion overlying the right frontal scalp (coronal image 20). CT CERVICAL SPINE FINDINGS Alignment: Normal cervical lordosis. Skull base and vertebrae: No acute fracture. No primary bone lesion or focal pathologic process. Soft tissues and spinal canal: No prevertebral fluid or swelling. No visible canal hematoma. Disc levels: Degenerative changes with anterior bridging osteophytes of the mid cervical spine. Spinal canal is patent. Upper chest: Visualized lung apices are notable for emphysematous changes. Other: Visualized thyroid is unremarkable. IMPRESSION: No evidence of acute intracranial abnormality. Mild small vessel ischemic changes. No evidence of acute traumatic injury to the cervical spine. 12 mm cutaneous lesion overlying the right frontal scalp, indeterminate. Correlate with physical exam/direct visualization to exclude cutaneous malignancy. Electronically Signed   By: Julian Hy M.D.   On: 11/10/2021 20:41   CT Cervical Spine Wo Contrast  Result Date: 11/10/2021 CLINICAL DATA:  Fall, head injury, altered mental status EXAM:  CT HEAD WITHOUT CONTRAST CT CERVICAL SPINE WITHOUT CONTRAST TECHNIQUE: Multidetector CT imaging of the head and cervical spine was performed following the standard protocol without intravenous contrast. Multiplanar CT image reconstructions of the cervical spine were also generated. RADIATION  DOSE REDUCTION: This exam was performed according to the departmental dose-optimization program which includes automated exposure control, adjustment of the mA and/or kV according to patient size and/or use of iterative reconstruction technique. COMPARISON:  CT head dated 10/09/2021 FINDINGS: CT HEAD FINDINGS Brain: No evidence of acute infarction, hemorrhage, hydrocephalus, extra-axial collection or mass lesion/mass effect. Mild subcortical white matter and periventricular small vessel ischemic changes. Vascular: No hyperdense vessel or unexpected calcification. Skull: Normal. Negative for fracture or focal lesion. Sinuses/Orbits: The visualized paranasal sinuses are essentially clear. The mastoid air cells are unopacified. Other: 12 mm cutaneous lesion overlying the right frontal scalp (coronal image 20). CT CERVICAL SPINE FINDINGS Alignment: Normal cervical lordosis. Skull base and vertebrae: No acute fracture. No primary bone lesion or focal pathologic process. Soft tissues and spinal canal: No prevertebral fluid or swelling. No visible canal hematoma. Disc levels: Degenerative changes with anterior bridging osteophytes of the mid cervical spine. Spinal canal is patent. Upper chest: Visualized lung apices are notable for emphysematous changes. Other: Visualized thyroid is unremarkable. IMPRESSION: No evidence of acute intracranial abnormality. Mild small vessel ischemic changes. No evidence of acute traumatic injury to the cervical spine. 12 mm cutaneous lesion overlying the right frontal scalp, indeterminate. Correlate with physical exam/direct visualization to exclude cutaneous malignancy. Electronically Signed   By: Julian Hy M.D.   On: 11/10/2021 20:41    Microbiology: Recent Results (from the past 240 hour(s))  Resp Panel by RT-PCR (Flu A&B, Covid) Anterior Nasal Swab     Status: None   Collection Time: 11/10/21  8:16 PM   Specimen: Anterior Nasal Swab  Result Value Ref Range Status   SARS  Coronavirus 2 by RT PCR NEGATIVE NEGATIVE Final    Comment: (NOTE) SARS-CoV-2 target nucleic acids are NOT DETECTED.  The SARS-CoV-2 RNA is generally detectable in upper respiratory specimens during the acute phase of infection. The lowest concentration of SARS-CoV-2 viral copies this assay can detect is 138 copies/mL. A negative result does not preclude SARS-Cov-2 infection and should not be used as the sole basis for treatment or other patient management decisions. A negative result may occur with  improper specimen collection/handling, submission of specimen other than nasopharyngeal swab, presence of viral mutation(s) within the areas targeted by this assay, and inadequate number of viral copies(<138 copies/mL). A negative result must be combined with clinical observations, patient history, and epidemiological information. The expected result is Negative.  Fact Sheet for Patients:  EntrepreneurPulse.com.au  Fact Sheet for Healthcare Providers:  IncredibleEmployment.be  This test is no t yet approved or cleared by the [place] FDA and  has been authorized for detection and/or diagnosis of SARS-CoV-2 by FDA under an Emergency Use Authorization (EUA). This EUA will remain  in effect (meaning this test can be used) for the duration of the COVID-19 declaration under Section 564(b)(1) of the Act, 21 U.S.C.section 360bbb-3(b)(1), unless the authorization is terminated  or revoked sooner.       Influenza A by PCR NEGATIVE NEGATIVE Final   Influenza B by PCR NEGATIVE NEGATIVE Final    Comment: (NOTE) The Xpert Xpress SARS-CoV-2/FLU/RSV plus assay is intended as an aid in the diagnosis of influenza from Nasopharyngeal swab specimens and should not be used as a sole basis for treatment.  Nasal washings and aspirates are unacceptable for Xpert Xpress SARS-CoV-2/FLU/RSV testing.  Fact Sheet for  Patients: EntrepreneurPulse.com.au  Fact Sheet for Healthcare Providers: IncredibleEmployment.be  This test is not yet approved or cleared by the [place] FDA and has been authorized for detection and/or diagnosis of SARS-CoV-2 by FDA under an Emergency Use Authorization (EUA). This EUA will remain in effect (meaning this test can be used) for the duration of the COVID-19 declaration under Section 564(b)(1) of the Act, 21 U.S.C. section 360bbb-3(b)(1), unless the authorization is terminated or revoked.  Performed at [facility], [address] [place], [place] [address]   Blood Culture (routine x 2)     Status: None (Preliminary result)   Collection Time: 11/10/21  8:18 PM   Specimen: BLOOD  Result Value Ref Range Status   Specimen Description BLOOD RIGHT ANTECUBITAL  Final   Special Requests   Final    BOTTLES DRAWN AEROBIC AND ANAEROBIC Blood Culture results may not be optimal due to an excessive volume of blood received in culture bottles   Culture   Final    NO GROWTH 2 DAYS Performed at [facility], [address], [place], [place] [address]    Report Status PENDING  Incomplete  Blood Culture (routine x 2)     Status: None (Preliminary result)   Collection Time: 11/10/21  8:18 PM   Specimen: BLOOD  Result Value Ref Range Status   Specimen Description BLOOD BLOOD RIGHT FOREARM  Final   Special Requests   Final    BOTTLES DRAWN AEROBIC AND ANAEROBIC Blood Culture results may not be optimal due to an excessive volume of blood received in culture bottles   Culture   Final    NO GROWTH 2 DAYS Performed at [facility], [address] [place], [place] [address]    Report Status PENDING  Incomplete  Urine Culture     Status: None   Collection Time: 11/10/21  8:18 PM   Specimen: In/Out Cath Urine  Result Value Ref Range Status   Specimen Description   Final    IN/OUT CATH URINE Performed at [facility], [address], [place], [place] [address]    Special Requests   Final    NONE Performed at [facility], [address], [place], [place] [address]    Culture   Final    NO GROWTH Performed at [facility], [place] [address], [place], [place] [address]    Report Status 11/11/2021 FINAL  Final  Respiratory (~20 pathogens) panel by PCR     Status: None   Collection Time: 11/10/21 11:13 PM   Specimen: Nasopharyngeal Swab; Respiratory  Result Value Ref Range Status   Adenovirus NOT DETECTED NOT DETECTED Final   Coronavirus 229E NOT DETECTED NOT DETECTED Final    Comment: (NOTE) The Coronavirus on the Respiratory Panel, DOES NOT test for the novel  Coronavirus (2019 nCoV)    Coronavirus HKU1 NOT DETECTED NOT DETECTED Final   Coronavirus NL63 NOT DETECTED NOT DETECTED Final   Coronavirus OC43 NOT DETECTED NOT DETECTED Final   Metapneumovirus NOT DETECTED NOT DETECTED Final   Rhinovirus / Enterovirus NOT DETECTED NOT DETECTED Final   Influenza A NOT DETECTED NOT DETECTED Final   Influenza B NOT DETECTED NOT DETECTED Final   Parainfluenza Virus 1 NOT DETECTED NOT DETECTED Final   Parainfluenza Virus 2 NOT DETECTED NOT DETECTED Final   Parainfluenza Virus 3 NOT DETECTED NOT DETECTED Final  Parainfluenza Virus 4 NOT DETECTED NOT DETECTED Final   Respiratory Syncytial Virus NOT DETECTED NOT DETECTED Final   Bordetella pertussis NOT DETECTED NOT DETECTED Final   Bordetella Parapertussis NOT DETECTED NOT DETECTED Final   Chlamydophila pneumoniae NOT DETECTED NOT DETECTED Final   Mycoplasma pneumoniae NOT DETECTED NOT DETECTED Final    Comment: Performed at [facility], [place] [address], [place], [place] [address]  MRSA Next Gen by PCR, Nasal     Status: None   Collection Time: 11/11/21  7:11 AM   Specimen: Nasal Mucosa; Nasal Swab  Result Value Ref Range Status   MRSA by PCR Next Gen NOT DETECTED NOT DETECTED Final    Comment: (NOTE) The GeneXpert  MRSA Assay (FDA approved for NASAL specimens only), is one component of a comprehensive MRSA colonization surveillance program. It is not intended to diagnose MRSA infection nor to guide or monitor treatment for MRSA infections. Test performance is not FDA approved in patients less than 61 years old. Performed at [facility], [address] [place], [place] [address]   Expectorated Sputum Assessment w Gram Stain, Rflx to Resp Cult     Status: None   Collection Time: 11/11/21 10:54 AM   Specimen: Expectorated Sputum  Result Value Ref Range Status   Specimen Description EXPECTORATED SPUTUM  Final   Special Requests NONE  Final   Sputum evaluation   Final    Sputum specimen not acceptable for testing.  Please recollect.   Performed at [facility], [address] [place],  [address]    Report Status 11/11/2021 FINAL  Final     Labs: Basic Metabolic Panel: Recent Labs  Lab 11/10/21 2017 11/11/21 0451 11/12/21 0437  NA 138 143 143  K 4.3 4.4 4.1  CL 106 112* 109  CO2 24 26 25   GLUCOSE 110* 96 99  BUN 7 9 8   CREATININE 1.33* 1.12 1.01  CALCIUM 9.2 9.0 9.3   Liver Function Tests: Recent Labs  Lab 11/10/21 2017  AST 22  ALT 17  ALKPHOS 133*  BILITOT 0.8  PROT 6.9  ALBUMIN 4.0   No results for input(s): "LIPASE", "AMYLASE" in the last 168 hours. No results for input(s): "AMMONIA" in the last 168 hours. CBC: Recent Labs  Lab 11/10/21 2017 11/11/21 0451  WBC 13.4* 11.1*  NEUTROABS 11.9*  --   HGB 12.3* 11.0*  HCT 38.4* 34.8*  MCV 86.7 87.7  PLT 163 137*   Cardiac Enzymes: No results for input(s): "CKTOTAL", "CKMB", "CKMBINDEX", "TROPONINI" in the last 168 hours. BNP: BNP (last 3 results) Recent Labs    07/05/21 2123 10/09/21 1307  BNP 91.4 200.2*    ProBNP (last 3 results) No results for input(s): "PROBNP" in the last 8760 hours.  CBG: No results for input(s): "GLUCAP" in the last 168 hours.     Signed:  Desma Maxim MD.  [facility] 11/12/2021, 1:46 PM

## 2021-11-12 NOTE — Progress Notes (Signed)
SLP Cancellation Note  Patient Details Name: Henry Owens MRN: [id number] DOB: 01-08-1961   Cancelled treatment:       Reason Eval/Treat Not Completed: SLP screened, no needs identified, will sign off (chart reviewed; met w/ pt and family in room. Pt drinking a coca-cola.) Reviewed chart notes including CT of Chest("emphysema, atypical infection should be considered  given areas of central cavitation").  Pt is on a Regular diet w/ thins. He and family deny any swallowing issues currently. He has been tolerating his oral diet w/out deficits noted by NSG. He is on RA, afebrile. Pt has no neurological history.  Discussed and recommended general aspiration precautions, especially when eating in bed(sit Upright w/ all po's). Discussed Menu, food items and gave support w/ ordering meals. No further skilled ST services indicated at this time. Pt and family agreed. MD/NSG updated.       Orinda Kenner, MS, CCC-SLP Speech Language Pathologist Rehab Services; [place] [phone] (ascom) Sukhmani Fetherolf 11/12/2021, 1:19 PM

## 2021-11-12 NOTE — Plan of Care (Signed)

## 2021-11-12 NOTE — Plan of Care (Signed)

## 2021-11-14 LAB — LEGIONELLA PNEUMOPHILA SEROGP 1 UR AG: L. pneumophila Serogp 1 Ur Ag: NEGATIVE

## 2021-11-15 LAB — CULTURE, BLOOD (ROUTINE X 2)
Culture: NO GROWTH
Culture: NO GROWTH

## 2021-11-15 LAB — CULTURE, RESPIRATORY W GRAM STAIN

## 2022-01-28 ENCOUNTER — Emergency Department: Payer: Medicaid Other

## 2022-01-28 ENCOUNTER — Inpatient Hospital Stay
Admission: EM | Admit: 2022-01-28 | Discharge: 2022-01-30 | DRG: 193 | Disposition: A | Discharge status: Home / Self Care | Payer: Medicaid Other | Attending: Internal Medicine | Admitting: Internal Medicine

## 2022-01-28 ENCOUNTER — Other Ambulatory Visit: Payer: Self-pay

## 2022-01-28 DIAGNOSIS — J9601 Acute respiratory failure with hypoxia: Secondary | ICD-10-CM | POA: Diagnosis present

## 2022-01-28 DIAGNOSIS — J9602 Acute respiratory failure with hypercapnia: Secondary | ICD-10-CM | POA: Diagnosis not present

## 2022-01-28 DIAGNOSIS — J441 Chronic obstructive pulmonary disease with (acute) exacerbation: Secondary | ICD-10-CM | POA: Diagnosis present

## 2022-01-28 DIAGNOSIS — Z91048 Other nonmedicinal substance allergy status: Secondary | ICD-10-CM | POA: Diagnosis not present

## 2022-01-28 DIAGNOSIS — N179 Acute kidney failure, unspecified: Secondary | ICD-10-CM | POA: Diagnosis present

## 2022-01-28 DIAGNOSIS — M549 Dorsalgia, unspecified: Secondary | ICD-10-CM | POA: Diagnosis not present

## 2022-01-28 DIAGNOSIS — F419 Anxiety disorder, unspecified: Secondary | ICD-10-CM | POA: Diagnosis present

## 2022-01-28 DIAGNOSIS — M1990 Unspecified osteoarthritis, unspecified site: Secondary | ICD-10-CM | POA: Diagnosis not present

## 2022-01-28 DIAGNOSIS — I5031 Acute diastolic (congestive) heart failure: Secondary | ICD-10-CM | POA: Diagnosis not present

## 2022-01-28 DIAGNOSIS — R0902 Hypoxemia: Secondary | ICD-10-CM | POA: Diagnosis present

## 2022-01-28 DIAGNOSIS — F1110 Opioid abuse, uncomplicated: Secondary | ICD-10-CM | POA: Diagnosis present

## 2022-01-28 DIAGNOSIS — J690 Pneumonitis due to inhalation of food and vomit: Secondary | ICD-10-CM | POA: Diagnosis present

## 2022-01-28 DIAGNOSIS — F17210 Nicotine dependence, cigarettes, uncomplicated: Secondary | ICD-10-CM | POA: Diagnosis present

## 2022-01-28 DIAGNOSIS — Z79891 Long term (current) use of opiate analgesic: Secondary | ICD-10-CM | POA: Diagnosis not present

## 2022-01-28 DIAGNOSIS — K5900 Constipation, unspecified: Secondary | ICD-10-CM | POA: Diagnosis present

## 2022-01-28 DIAGNOSIS — Z880 Allergy status to penicillin: Secondary | ICD-10-CM | POA: Diagnosis not present

## 2022-01-28 DIAGNOSIS — M545 Low back pain, unspecified: Secondary | ICD-10-CM | POA: Diagnosis not present

## 2022-01-28 DIAGNOSIS — J209 Acute bronchitis, unspecified: Secondary | ICD-10-CM | POA: Diagnosis present

## 2022-01-28 DIAGNOSIS — F17200 Nicotine dependence, unspecified, uncomplicated: Secondary | ICD-10-CM | POA: Diagnosis present

## 2022-01-28 DIAGNOSIS — G8929 Other chronic pain: Secondary | ICD-10-CM | POA: Diagnosis not present

## 2022-01-28 DIAGNOSIS — Z881 Allergy status to other antibiotic agents status: Secondary | ICD-10-CM | POA: Diagnosis not present

## 2022-01-28 DIAGNOSIS — J189 Pneumonia, unspecified organism: Secondary | ICD-10-CM | POA: Diagnosis not present

## 2022-01-28 DIAGNOSIS — R0602 Shortness of breath: Principal | ICD-10-CM

## 2022-01-28 DIAGNOSIS — N182 Chronic kidney disease, stage 2 (mild): Secondary | ICD-10-CM | POA: Diagnosis present

## 2022-01-28 DIAGNOSIS — R000 Tachycardia, unspecified: Secondary | ICD-10-CM | POA: Diagnosis present

## 2022-01-28 DIAGNOSIS — J432 Centrilobular emphysema: Secondary | ICD-10-CM | POA: Diagnosis not present

## 2022-01-28 LAB — CBC
HCT: 43.8 % (ref 39.0–52.0)
Hemoglobin: 14 g/dL (ref 13.0–17.0)
MCH: 28.4 pg (ref 26.0–34.0)
MCHC: 32 g/dL (ref 30.0–36.0)
MCV: 88.8 fL (ref 80.0–100.0)
Platelets: 179 10*3/uL (ref 150–400)
RBC: 4.93 MIL/uL (ref 4.22–5.81)
RDW: 16 % — ABNORMAL HIGH (ref 11.5–15.5)
WBC: 13.7 10*3/uL — ABNORMAL HIGH (ref 4.0–10.5)
nRBC: 0 % (ref 0.0–0.2)

## 2022-01-28 LAB — HEPATIC FUNCTION PANEL
ALT: 27 U/L (ref 0–44)
AST: 21 U/L (ref 15–41)
Albumin: 4.6 g/dL (ref 3.5–5.0)
Alkaline Phosphatase: 145 U/L — ABNORMAL HIGH (ref 38–126)
Bilirubin, Direct: <0.1 mg/dL (ref 0.0–0.2)
Total Bilirubin: 0.7 mg/dL (ref 0.3–1.2)
Total Protein: 7.3 g/dL (ref 6.5–8.1)

## 2022-01-28 LAB — BASIC METABOLIC PANEL
Anion gap: 6 (ref 5–15)
BUN: 11 mg/dL (ref 6–20)
CO2: 26 mmol/L (ref 22–32)
Calcium: 9.6 mg/dL (ref 8.9–10.3)
Chloride: 106 mmol/L (ref 98–111)
Creatinine, Ser: 1.49 mg/dL — ABNORMAL HIGH (ref 0.61–1.24)
GFR, Estimated: 53 mL/min — ABNORMAL LOW (ref >60)
Glucose, Bld: 112 mg/dL — ABNORMAL HIGH (ref 70–99)
Potassium: 4.5 mmol/L (ref 3.5–5.1)
Sodium: 138 mmol/L (ref 135–145)

## 2022-01-28 LAB — PROCALCITONIN: Procalcitonin: 0.51 ng/mL

## 2022-01-28 LAB — LACTIC ACID, PLASMA: Lactic Acid, Venous: 1.8 mmol/L (ref 0.5–1.9)

## 2022-01-28 LAB — LIPASE, BLOOD: Lipase: 26 U/L (ref 11–51)

## 2022-01-28 LAB — CREATININE, SERUM
Creatinine, Ser: 1.39 mg/dL — ABNORMAL HIGH (ref 0.61–1.24)
GFR, Estimated: 58 mL/min — ABNORMAL LOW (ref >60)

## 2022-01-28 LAB — TROPONIN I (HIGH SENSITIVITY)
Troponin I (High Sensitivity): 7 ng/L (ref <18)
Troponin I (High Sensitivity): 6 ng/L (ref <18)

## 2022-01-28 MED ORDER — POLYETHYLENE GLYCOL 3350 17 G PO PACK: 17.0000 g | PACK | Freq: Every day | ORAL | Status: DC | PRN

## 2022-01-28 MED ORDER — PANTOPRAZOLE SODIUM 40 MG PO TBEC
40.0000 mg | DELAYED_RELEASE_TABLET | Freq: Every day | ORAL | Status: DC
  Administered 2022-01-29 – 2022-01-30 (×2): 40 mg via ORAL
  Filled 2022-01-28 (×2): qty 1

## 2022-01-28 MED ORDER — ENOXAPARIN SODIUM 40 MG/0.4ML IJ SOSY
40.0000 mg | PREFILLED_SYRINGE | INTRAMUSCULAR | Status: DC
  Administered 2022-01-28 – 2022-01-29 (×2): 40 mg via SUBCUTANEOUS
  Filled 2022-01-28 (×2): qty 0.4

## 2022-01-28 MED ORDER — ALBUTEROL SULFATE (2.5 MG/3ML) 0.083% IN NEBU: 2.5000 mg | INHALATION_SOLUTION | Freq: Four times a day (QID) | RESPIRATORY_TRACT | Status: DC | PRN

## 2022-01-28 MED ORDER — VANCOMYCIN HCL IN DEXTROSE 1-5 GM/200ML-% IV SOLN
1000.0000 mg | Freq: Once | INTRAVENOUS | Status: AC
  Administered 2022-01-28: 1000 mg via INTRAVENOUS
  Filled 2022-01-28: qty 200

## 2022-01-28 MED ORDER — IOHEXOL 350 MG/ML SOLN
100.0000 mL | Freq: Once | INTRAVENOUS | Status: AC | PRN
  Administered 2022-01-28: 100 mL via INTRAVENOUS

## 2022-01-28 MED ORDER — GABAPENTIN 300 MG PO CAPS
600.0000 mg | ORAL_CAPSULE | Freq: Three times a day (TID) | ORAL | Status: DC
  Administered 2022-01-29 – 2022-01-30 (×5): 600 mg via ORAL
  Filled 2022-01-28 (×5): qty 2

## 2022-01-28 MED ORDER — SODIUM CHLORIDE 0.9 % IV SOLN
2.0000 g | INTRAVENOUS | Status: DC
  Administered 2022-01-29 (×2): 2 g via INTRAVENOUS
  Filled 2022-01-28: qty 2
  Filled 2022-01-28 (×2): qty 20

## 2022-01-28 MED ORDER — SODIUM CHLORIDE 0.9 % IV SOLN
2.0000 g | Freq: Once | INTRAVENOUS | Status: AC
  Administered 2022-01-28: 2 g via INTRAVENOUS
  Filled 2022-01-28: qty 12.5

## 2022-01-28 MED ORDER — MOMETASONE FURO-FORMOTEROL FUM 100-5 MCG/ACT IN AERO
2.0000 | INHALATION_SPRAY | Freq: Two times a day (BID) | RESPIRATORY_TRACT | Status: DC
  Administered 2022-01-29 – 2022-01-30 (×3): 2 via RESPIRATORY_TRACT
  Filled 2022-01-28 (×2): qty 8.8

## 2022-01-28 MED ORDER — SODIUM CHLORIDE 0.9 % IV SOLN
500.0000 mg | INTRAVENOUS | Status: DC
  Administered 2022-01-28: 500 mg via INTRAVENOUS
  Filled 2022-01-28: qty 5

## 2022-01-28 MED ORDER — SERTRALINE HCL 50 MG PO TABS
150.0000 mg | ORAL_TABLET | Freq: Every day | ORAL | Status: DC
  Administered 2022-01-29 – 2022-01-30 (×2): 150 mg via ORAL
  Filled 2022-01-28 (×2): qty 3

## 2022-01-28 MED ORDER — HYDROMORPHONE HCL 1 MG/ML IJ SOLN
0.5000 mg | Freq: Once | INTRAMUSCULAR | Status: AC
  Administered 2022-01-28: 0.5 mg via INTRAVENOUS
  Filled 2022-01-28: qty 0.5

## 2022-01-28 MED ORDER — DOCUSATE SODIUM 50 MG/5ML PO LIQD
50.0000 mg | Freq: Two times a day (BID) | ORAL | Status: DC
  Administered 2022-01-29 (×2): 50 mg via ORAL
  Filled 2022-01-28 (×3): qty 10

## 2022-01-28 MED ORDER — LISINOPRIL 10 MG PO TABS
10.0000 mg | ORAL_TABLET | Freq: Every day | ORAL | Status: DC
  Administered 2022-01-29 – 2022-01-30 (×2): 10 mg via ORAL
  Filled 2022-01-28 (×2): qty 1

## 2022-01-28 MED ORDER — SODIUM CHLORIDE 0.9 % IV SOLN: INTRAVENOUS | Status: DC

## 2022-01-28 MED ORDER — SODIUM CHLORIDE 0.9 % IV BOLUS
500.0000 mL | Freq: Once | INTRAVENOUS | Status: AC
  Administered 2022-01-28: 500 mL via INTRAVENOUS

## 2022-01-28 MED ORDER — FLUTICASONE PROPIONATE 50 MCG/ACT NA SUSP: 1.0000 | Freq: Every day | NASAL | Status: DC | PRN

## 2022-01-28 MED ORDER — ALPRAZOLAM 0.25 MG PO TABS
0.2500 mg | ORAL_TABLET | Freq: Three times a day (TID) | ORAL | Status: DC | PRN
  Administered 2022-01-29 (×2): 0.25 mg via ORAL
  Filled 2022-01-28 (×2): qty 1

## 2022-01-28 MED ORDER — KETOROLAC TROMETHAMINE 15 MG/ML IJ SOLN
15.0000 mg | Freq: Four times a day (QID) | INTRAMUSCULAR | Status: DC
  Administered 2022-01-28 – 2022-01-30 (×7): 15 mg via INTRAVENOUS
  Filled 2022-01-28 (×7): qty 1

## 2022-01-28 MED ORDER — MORPHINE SULFATE (PF) 2 MG/ML IV SOLN
2.0000 mg | INTRAVENOUS | Status: DC | PRN
  Administered 2022-01-28 (×2): 2 mg via INTRAVENOUS
  Filled 2022-01-28 (×2): qty 1

## 2022-01-28 NOTE — ED Notes (Signed)
Pt placed on hospital bed for comfort at this time. Pt remains on 3L [place]. Pt asked about pharmacy doing his med rec and more pain medication. Explained left request for pharm tech to do med rec and reached out to admitting MD regarding more pain medication.

## 2022-01-28 NOTE — ED Triage Notes (Addendum)
Pt to ED via POV from home. Pt reports SOB and right rib pain. Pt has hx double pneumonia. Pt follows up with Dr. Lanney Gins who prescribed 21 day steroid. Pt states no improvement but has continued to have worsening symptoms. Pt denies blood thinner. Pt placed on 2L [place] for comfort and WOB. Pt reports thermometer at home reading 101.

## 2022-01-28 NOTE — ED Provider Notes (Addendum)
[facility] Provider Note    Event Date/Time   First MD Initiated Contact with Patient 01/28/22 1358     (approximate)   History   Shortness of Breath and Chest Pain   HPI  Henry Owens is a 61 y.o. male with chronic back pain on chronic opioids who comes in with shortness of breath and chest pain.  On review of records patient was admitted back in July 2023 for unintentional opioid overdose complicated by aspiration pneumonia requiring ET tube for airway protection on 6/18.  When he was admitted back in July he was treated for multifocal pneumonia patient was treated with Unasyn due to cavitary lesions and followed up outpatient with pulmonary.  Patient was seen on 9/20 and at that time was doing well.  However patient reports new shortness of breath and right rib pain.  Currently on 21 days of steroids but due to worsening symptoms came to the emergency room to be evaluated.  Patient is also currently on Bactrim as well.  Patient reports worsening pain on the right upper abdomen as well as like the right lung area.  He reports having ultrasound previously that she did not show any gallstones.  Denies any falls and hitting his head.  Physical Exam   Triage Vital Signs: ED Triage Vitals  Enc Vitals Group     BP 01/28/22 1340 (!) 150/87     Pulse Rate 01/28/22 1340 (!) 128     Resp 01/28/22 1340 18     Temp 01/28/22 1343 98.5 F (36.9 C)     Temp Source 01/28/22 1343 Oral     SpO2 01/28/22 1340 94 %     Weight 01/28/22 1341 185 lb (83.9 kg)     Height 01/28/22 1341 '5\' 10"'$  (1.778 m)     Head Circumference --      Peak Flow --      Pain Score 01/28/22 1340 9     Pain Loc --      Pain Edu? --      Excl. in [place]? --     Most recent vital signs: Vitals:   01/28/22 1340 01/28/22 1343  BP: (!) 150/87   Pulse: (!) 128   Resp: 18   Temp:  98.5 F (36.9 C)  SpO2: 94%      General: Awake, no distress.  CV:  Good peripheral perfusion.   Tachycardic Resp:  Normal effort.  Abd:  No distention.  Tender on the right side upper abdomen Other:  No swelling the legs.   ED Results / Procedures / Treatments   Labs (all labs ordered are listed, but only abnormal results are displayed) Labs Reviewed  BASIC METABOLIC PANEL  CBC  TROPONIN I (HIGH SENSITIVITY)     EKG  My interpretation of EKG:  Sinus tachycardia rate of 130 without any ST elevation or T wave inversions, normal intervals  RADIOLOGY I have reviewed the xray personally and interpreted no evidence of any pneumonia   PROCEDURES:  Critical Care performed: No  .1-3 Lead EKG Interpretation  Performed by: Vanessa Export, MD Authorized by: Vanessa [place], MD     Interpretation: abnormal     ECG rate:  120   ECG rate assessment: tachycardic     Rhythm: sinus tachycardia     Ectopy: none     Conduction: normal   .Critical Care  Performed by: Vanessa Woodridge, MD Authorized by: Vanessa Cave, MD   Critical  care provider statement:    Critical care time (minutes):  30   Critical care was necessary to treat or prevent imminent or life-threatening deterioration of the following conditions:  Respiratory failure   Critical care was time spent personally by me on the following activities:  Development of treatment plan with patient or surrogate, discussions with consultants, evaluation of patient's response to treatment, examination of patient, ordering and review of laboratory studies, ordering and review of radiographic studies, ordering and performing treatments and interventions, pulse oximetry, re-evaluation of patient's condition and review of old charts    MEDICATIONS ORDERED IN ED: Medications  HYDROmorphone (DILAUDID) injection 0.5 mg (has no administration in time range)  sodium chloride 0.9 % bolus 500 mL (has no administration in time range)     IMPRESSION / MDM / [place] / ED COURSE  I reviewed the triage vital signs and the nursing  notes.   Patient's presentation is most consistent with acute presentation with potential threat to life or bodily function.   Patient comes in tachycardic with shortness of breath and chest pain.  Given his history of significant pneumonias and hospitalizations given chest x-ray did not show any pneumonia or pneumothorax we will proceed with CT PE to evaluate for pulmonary embolism or cavitary pneumonia being missed on the chest x-ray.  We will also get CT abdomen given some pain referring into the abdomen just to ensure there is no other acute pathology.  Patient was given half a milligram of Dilaudid and some fluids to help with symptoms.  Patient was not hypoxic but was placed on 2 L for comfort due to work of breathing.  Patient's white count was slightly elevated meet sepsis criteria but given unclear source no fever we will hold off on antibiotics at this time.  White count elevation could just be from the steroids.  Patient be handed off to oncoming team pending these results  The patient is on the cardiac monitor to evaluate for evidence of arrhythmia and/or significant heart rate changes.      FINAL CLINICAL IMPRESSION(S) / ED DIAGNOSES   Final diagnoses:  Shortness of breath  Tachycardia     Rx / DC Orders   ED Discharge Orders     None        Note:  This document was prepared using Dragon voice recognition software and may include unintentional dictation errors.   Vanessa Woodland, MD 01/28/22 1411    Vanessa [place], MD 01/28/22 [phone]

## 2022-01-28 NOTE — ED Notes (Signed)
Admitting MD messaged again regarding patient's home meds. States he will order shortly.

## 2022-01-28 NOTE — H&P (Signed)
History and Physical    Patient: Henry Owens ION:[id number] DOB: 05/07/1960 DOA: 01/28/2022 DOS: the patient was seen and examined on 01/28/2022 PCP: Albina Billet, MD  Patient coming from: Home  Chief Complaint:  Chief Complaint  Patient presents with   Shortness of Breath   Chest Pain   HPI: Henry Owens is a 61 y.o. male with medical history significant of osteoarthritis, anxiety disorder, chronic low back pain, history of opiate overdose with aspiration pneumonia, previous cavitary lesions who presented to the ER with worsening shortness of breath.  He was apparently seen back in September placed on steroids at the time.  Was undergoing treatment.  Has been on Bactrim but not getting better.  In the ER today woke up shows pneumonia.  Patient is now being admitted for further treatment.  Denied any fever or chills.  Patient is complaining of left hip pain otherwise.  At this point patient will continue with IV antibiotics with steroids.  Review of Systems: As mentioned in the history of present illness. All other systems reviewed and are negative. Past Medical History:  Diagnosis Date   Anxiety    Arthritis    Flu 07/2017   Past Surgical History:  Procedure Laterality Date   APPENDECTOMY  1975   BACK SURGERY     KNEE ARTHROSCOPY WITH MEDIAL MENISECTOMY Right 08/20/2017   Procedure: KNEE ARTHROSCOPY WITH PARTIAL MEDIAL AND LATERAL MENISECTOMY,PARTIAL SYNOVECTOMY,CHONDROPLASTY, LOOSE BODY REMOVAL;  Surgeon: Lovell Sheehan, MD;  Location: [facility] ORS;  Service: Orthopedics;  Laterality: Right;   [place], 2000   Social History:  reports that he has been smoking cigarettes. He has a 37.50 pack-year smoking history. He has never used smokeless tobacco. He reports that he does not currently use drugs. He reports that he does not drink alcohol.  Allergies  Allergen Reactions   Wound Dressing Adhesive Other (See Comments)    BLISTER   Azithromycin Rash    Penicillin G Rash    Has patient had a PCN reaction causing immediate rash, facial/tongue/throat swelling, SOB or lightheadedness with hypotension: Yes Has patient had a PCN reaction causing severe rash involving mucus membranes or skin necrosis: No Has patient had a PCN reaction that required hospitalization: No Has patient had a PCN reaction occurring within the last 10 years: No If all of the above answers are "NO", then may proceed with Cephalosporin use.     Family History  Problem Relation Age of Onset   Bladder Cancer Neg Hx    Prostate cancer Neg Hx    Hematuria Neg Hx     Prior to Admission medications   Medication Sig Start Date End Date Taking? Authorizing Provider  albuterol (PROVENTIL) (2.5 MG/3ML) 0.083% nebulizer solution Take 2.5 mg by nebulization every 6 (six) hours as needed for wheezing or shortness of breath.    [provider]  ALPRAZolam Henry Owens) 0.5 MG tablet Take 0.5 mg by mouth 2 (two) times daily.    [provider]  amoxicillin-clavulanate (AUGMENTIN) 875-125 MG tablet Take 1 tablet by mouth every 12 (twelve) hours. 11/12/21   Henry Owens, Ailene Rud, MD  docusate sodium (COLACE) 50 MG capsule Take 50 mg by mouth 2 (two) times daily.    [provider]  fluticasone (FLONASE) 50 MCG/ACT nasal spray Place 1 spray into both nostrils daily as needed for allergies or rhinitis.    [provider]  gabapentin (NEURONTIN) 300 MG capsule Take 600 mg by mouth  3 (three) times daily. 09/30/21   [provider]  lisinopril (ZESTRIL) 10 MG tablet Take 10 mg by mouth daily.    [provider]  naloxone [facility]) nasal spray 4 mg/0.1 mL As needed For opioid reversal (unresponsiveness) intranasal spray 10/12/21   Henry Grayer, MD  oxyCODONE (OXY IR/ROXICODONE) 5 MG immediate release tablet Take 1 tablet (5 mg total) by mouth every 6 (six) hours as needed for moderate pain. 10/12/21   Henry Grayer, MD  pantoprazole (PROTONIX)  40 MG tablet Take 1 tablet (40 mg total) by mouth daily. 07/11/21 11/11/21  Henry Bi, MD  polyethylene glycol (MIRALAX / GLYCOLAX) 17 g packet Take 17 g by mouth daily as needed for moderate constipation. 10/12/21   Henry Grayer, MD  sertraline (ZOLOFT) 100 MG tablet Take 150 mg by mouth daily.    [provider]  SYMBICORT 80-4.5 MCG/ACT inhaler Inhale 2 puffs into the lungs in the morning and at bedtime. 10/03/21   [provider]    Physical Exam: Vitals:   01/28/22 1341 01/28/22 1343 01/28/22 1445 01/28/22 1600  BP:    (!) 141/86  Pulse:    89  Resp:    (!) 23  Temp:  98.5 F (36.9 C) 99.2 F (37.3 C)   TempSrc:  Oral Oral   SpO2:    96%  Weight: 83.9 kg     Height: '5\' 10"'$  (1.778 m)      Constitutional: NAD, calm, comfortable Eyes: PERRL, lids and conjunctivae normal ENMT: Mucous membranes are moist. Posterior pharynx clear of any exudate or lesions.Normal dentition.  Neck: normal, supple, no masses, no thyromegaly Respiratory: clear to auscultation bilaterally, no wheezing, no crackles. Normal respiratory effort. No accessory muscle use.  Cardiovascular: Regular rate and rhythm, no murmurs / rubs / gallops. No extremity edema. 2+ pedal pulses. No carotid bruits.  Abdomen: no tenderness, no masses palpated. No hepatosplenomegaly. Bowel sounds positive.  Musculoskeletal: Good range of motion, no joint swelling or tenderness, Skin: no rashes, lesions, ulcers. No induration Neurologic: CN 2-12 grossly intact. Sensation intact, DTR normal. Strength 5/5 in all 4.  Psychiatric: Normal judgment and insight. Alert and oriented x 3. Normal mood  Data Reviewed:  Creatinine 1.49 with alkaline phos 145, lactic acid 1.8, white count 13.7 and normal hemoglobin and platelets.  CT abdomen pelvis with contrast as well as CT angiogram showed no PE but bibasilar groundglass airspace disease on emphysema bibasilar pneumonia or aspiration.  Also persistent stool burden consistent  with constipation.  Assessment and Plan:  #1 bibasilar pneumonia: Patient has been on treatment so almost likely to be HCAP.  Patient will be admitted and initiated on IV antibiotics, steroids as well as breathing treatment.  Blood cultures obtained and will follow results.  #2 opiate abuse with prior overdose: We will be careful with medications and avoid withdrawal symptoms.  Check drug screen  #3 AKI: Hydrate and monitor renal function  #4 tobacco abuse: Continue nicotine patch and tobacco cessation counseling  #5 acute respiratory failure with hypoxia: Patient is now requiring 2 L of oxygen.  Most likely combination of COPD and pneumonia.  #6 COPD exacerbation: No formal diagnosis but patient has COPD on CT findings.  Continue treatment as above.    Advance Care Planning:   Code Status: Prior full code  Consults: None  Family Communication: No family at bedside  Severity of Illness: The appropriate patient status for this patient is INPATIENT. Inpatient status is judged to be reasonable and necessary  in order to provide the required intensity of service to ensure the patient's safety. The patient's presenting symptoms, physical exam findings, and initial radiographic and laboratory data in the context of their chronic comorbidities is felt to place them at high risk for further clinical deterioration. Furthermore, it is not anticipated that the patient will be medically stable for discharge from the hospital within 2 midnights of admission.   * I certify that at the point of admission it is my clinical judgment that the patient will require inpatient hospital care spanning beyond 2 midnights from the point of admission due to high intensity of service, high risk for further deterioration and high frequency of surveillance required.*  AuthorBarbette Merino, MD 01/28/2022 6:09 PM  For on call review [email or web address].

## 2022-01-29 DIAGNOSIS — J9602 Acute respiratory failure with hypercapnia: Secondary | ICD-10-CM | POA: Diagnosis not present

## 2022-01-29 DIAGNOSIS — J9601 Acute respiratory failure with hypoxia: Secondary | ICD-10-CM | POA: Diagnosis not present

## 2022-01-29 LAB — COMPREHENSIVE METABOLIC PANEL
ALT: 23 U/L (ref 0–44)
AST: 15 U/L (ref 15–41)
Albumin: 3.8 g/dL (ref 3.5–5.0)
Alkaline Phosphatase: 121 U/L (ref 38–126)
Anion gap: 8 (ref 5–15)
BUN: 16 mg/dL (ref 6–20)
CO2: 26 mmol/L (ref 22–32)
Calcium: 9.5 mg/dL (ref 8.9–10.3)
Chloride: 105 mmol/L (ref 98–111)
Creatinine, Ser: 1.61 mg/dL — ABNORMAL HIGH (ref 0.61–1.24)
GFR, Estimated: 49 mL/min — ABNORMAL LOW (ref >60)
Glucose, Bld: 101 mg/dL — ABNORMAL HIGH (ref 70–99)
Potassium: 5.1 mmol/L (ref 3.5–5.1)
Sodium: 139 mmol/L (ref 135–145)
Total Bilirubin: 0.5 mg/dL (ref 0.3–1.2)
Total Protein: 6.6 g/dL (ref 6.5–8.1)

## 2022-01-29 LAB — CBC
HCT: 36.7 % — ABNORMAL LOW (ref 39.0–52.0)
Hemoglobin: 11.5 g/dL — ABNORMAL LOW (ref 13.0–17.0)
MCH: 28.5 pg (ref 26.0–34.0)
MCHC: 31.3 g/dL (ref 30.0–36.0)
MCV: 91.1 fL (ref 80.0–100.0)
Platelets: 119 10*3/uL — ABNORMAL LOW (ref 150–400)
RBC: 4.03 MIL/uL — ABNORMAL LOW (ref 4.22–5.81)
RDW: 16.3 % — ABNORMAL HIGH (ref 11.5–15.5)
WBC: 7.3 10*3/uL (ref 4.0–10.5)
nRBC: 0 % (ref 0.0–0.2)

## 2022-01-29 LAB — HIV ANTIBODY (ROUTINE TESTING W REFLEX): HIV Screen 4th Generation wRfx: NONREACTIVE

## 2022-01-29 LAB — PROCALCITONIN: Procalcitonin: 1.15 ng/mL

## 2022-01-29 MED ORDER — PREDNISONE 10 MG PO TABS
10.0000 mg | ORAL_TABLET | Freq: Every day | ORAL | Status: DC
  Administered 2022-01-29 – 2022-01-30 (×2): 10 mg via ORAL
  Filled 2022-01-29 (×2): qty 1

## 2022-01-29 MED ORDER — AZITHROMYCIN 500 MG PO TABS
500.0000 mg | ORAL_TABLET | Freq: Every day | ORAL | Status: DC
  Administered 2022-01-29: 500 mg via ORAL
  Filled 2022-01-29: qty 1

## 2022-01-29 MED ORDER — OXYCODONE HCL 5 MG PO TABS
5.0000 mg | ORAL_TABLET | Freq: Four times a day (QID) | ORAL | Status: DC | PRN
  Administered 2022-01-29 – 2022-01-30 (×4): 5 mg via ORAL
  Filled 2022-01-29 (×4): qty 1

## 2022-01-29 NOTE — Progress Notes (Signed)
Patient admitted to Room 129 from ED. VSS. Admission screening and Assessment completed. Patient on 2 L of O2 via [place] and on Antibiotic Therapy. Wife at bedside. Will continue to monitor and assess with plan of care.

## 2022-01-29 NOTE — Plan of Care (Signed)

## 2022-01-29 NOTE — Progress Notes (Signed)
PHARMACIST - PHYSICIAN COMMUNICATION  CONCERNING: Antibiotic IV to Oral Route Change Policy  RECOMMENDATION: This patient is receiving azithromycin by the intravenous route.  Based on criteria approved by the Pharmacy and Therapeutics Committee, the antibiotic(s) is/are being converted to the equivalent oral dose form(s).   DESCRIPTION: These criteria include: Patient being treated for a respiratory tract infection, urinary tract infection, cellulitis or clostridium difficile associated diarrhea if on metronidazole The patient is not neutropenic and does not exhibit a GI malabsorption state The patient is eating (either orally or via tube) and/or has been taking other orally administered medications for a least 24 hours The patient is improving clinically and has a Tmax < 100.5  If you have questions about this conversion, please contact the Pharmacy Department   Elmira Olkowski B Neli Fofana  01/29/22    

## 2022-01-29 NOTE — ED Notes (Signed)
Pt sleeping. Blanket provided to wife per request.

## 2022-01-29 NOTE — Progress Notes (Signed)
[name] NAME: Henry Owens    MR#:  [id number]  DATE OF BIRTH:  1961/03/03  SUBJECTIVE:      VITALS:  Blood pressure (!) 156/77, pulse 71, temperature 98.2 F (36.8 C), temperature source Oral, resp. rate 18, height '5\' 10"'$  (1.778 m), weight 83.9 kg, SpO2 100 %.  PHYSICAL EXAMINATION:   GENERAL:  61 y.o.-year-old patient lying in the bed with no acute distress.  LUNGS: Normal breath sounds bilaterally, no wheezing CARDIOVASCULAR: S1, S2 normal. No murmurs,   ABDOMEN: Soft, nontender, nondistended. Bowel sounds present.  EXTREMITIES: No  edema b/l.    NEUROLOGIC: nonfocal  patient is alert and awake SKIN: No obvious rash, lesion, or ulcer.   LABORATORY PANEL:  CBC Recent Labs  Lab 01/29/22 0544  WBC 7.3  HGB 11.5*  HCT 36.7*  PLT 119*    Chemistries  Recent Labs  Lab 01/29/22 0544  NA 139  K 5.1  CL 105  CO2 26  GLUCOSE 101*  BUN 16  CREATININE 1.61*  CALCIUM 9.5  AST 15  ALT 23  ALKPHOS 121  BILITOT 0.5   Cardiac Enzymes No results for input(s): "TROPONINI" in the last 168 hours. RADIOLOGY:  CT Angio Chest PE W and/or Wo Contrast  Result Date: 01/28/2022 CLINICAL DATA:  Short of breath, right rib pain, febrile, abdominal pain EXAM: CT ANGIOGRAPHY CHEST CT ABDOMEN AND PELVIS WITH CONTRAST TECHNIQUE: Multidetector CT imaging of the chest was performed using the standard protocol during bolus administration of intravenous contrast. Multiplanar CT image reconstructions and MIPs were obtained to evaluate the vascular anatomy. Multidetector CT imaging of the abdomen and pelvis was performed using the standard protocol during bolus administration of intravenous contrast. RADIATION DOSE REDUCTION: This exam was performed according to the departmental dose-optimization program which includes automated exposure control, adjustment of the mA and/or kV according to patient size and/or use of iterative reconstruction  technique. CONTRAST:  161m OMNIPAQUE IOHEXOL 350 MG/ML SOLN COMPARISON:  01/28/2022, 11/10/2021, 07/05/2021 FINDINGS: CTA CHEST FINDINGS Cardiovascular: This is a technically adequate evaluation of the pulmonary vasculature. No filling defects or pulmonary emboli. The heart is unremarkable without pericardial effusion. No evidence of thoracic aortic aneurysm or dissection. Stable atherosclerosis. Mediastinum/Nodes: No enlarged mediastinal, hilar, or axillary lymph nodes. Thyroid gland, trachea, and esophagus demonstrate no significant findings. Lungs/Pleura: Severe emphysema unchanged. Patchy ground-glass airspace disease again identified within the mid to lower lung zones, greatest within the lingula and bilateral lower lobes. Areas of cavitation seen on the prior study are no longer evident. No effusion or pneumothorax. Central airways are patent. Stable 4 mm right upper lobe pulmonary nodule, reference image 58/3. Musculoskeletal: Diffuse bony sclerosis is again identified, please correlate with any underlying history of hyperparathyroidism, myelofibrosis, or other metabolic abnormality. No acute or destructive bony lesion. Reconstructed images demonstrate no additional findings. Review of the MIP images confirms the above findings. CT ABDOMEN and PELVIS FINDINGS Hepatobiliary: No focal liver abnormality is seen. No gallstones, gallbladder wall thickening, or biliary dilatation. Pancreas: Unremarkable. No pancreatic ductal dilatation or surrounding inflammatory changes. Spleen: Spleen is enlarged measuring 15.8 cm in craniocaudal length. No focal parenchymal abnormality. Adrenals/Urinary Tract: 2 cm simple appearing cyst lower pole right kidney does not require follow-up. Otherwise the kidneys enhance normally and symmetrically. No urinary tract calculi or obstructive uropathy. The adrenals and bladder are unremarkable. Stomach/Bowel: No bowel obstruction or ileus. Moderate retained stool throughout the colon,  greatest in the rectosigmoid colon,  consistent with constipation. The appendix is not identified. No bowel wall thickening or inflammatory change. Vascular/Lymphatic: Aortic atherosclerosis. Retroaortic left renal vein incidentally noted, a frequent anatomic variant. No pathologic adenopathy within the abdomen or pelvis. Reproductive: Prostate is unremarkable. Other: No free fluid or free intraperitoneal gas. No abdominal wall hernia. Musculoskeletal: Diffuse bony sclerosis again noted, please correlate with any history of underlying hyperparathyroidism, myelofibrosis, or metabolic abnormality. No acute or destructive bony lesion. Postsurgical changes from prior L4-S1 fusion. Reconstructed images demonstrate no additional findings. Review of the MIP images confirms the above findings. IMPRESSION: 1. No evidence of pulmonary embolus. 2. Bibasilar ground-glass airspace disease superimposed upon background emphysema, most consistent with bibasilar pneumonia or aspiration. 3. Moderate retained stool throughout the colon consistent with constipation. No bowel obstruction or ileus. 4. Splenomegaly. 5. Aortic Atherosclerosis (ICD10-I70.0) and Emphysema (ICD10-J43.9). Electronically Signed   By: Randa Ngo M.D.   On: 01/28/2022 16:56   CT ABDOMEN PELVIS W CONTRAST  Result Date: 01/28/2022 CLINICAL DATA:  Short of breath, right rib pain, febrile, abdominal pain EXAM: CT ANGIOGRAPHY CHEST CT ABDOMEN AND PELVIS WITH CONTRAST TECHNIQUE: Multidetector CT imaging of the chest was performed using the standard protocol during bolus administration of intravenous contrast. Multiplanar CT image reconstructions and MIPs were obtained to evaluate the vascular anatomy. Multidetector CT imaging of the abdomen and pelvis was performed using the standard protocol during bolus administration of intravenous contrast. RADIATION DOSE REDUCTION: This exam was performed according to the departmental dose-optimization program which  includes automated exposure control, adjustment of the mA and/or kV according to patient size and/or use of iterative reconstruction technique. CONTRAST:  169m OMNIPAQUE IOHEXOL 350 MG/ML SOLN COMPARISON:  01/28/2022, 11/10/2021, 07/05/2021 FINDINGS: CTA CHEST FINDINGS Cardiovascular: This is a technically adequate evaluation of the pulmonary vasculature. No filling defects or pulmonary emboli. The heart is unremarkable without pericardial effusion. No evidence of thoracic aortic aneurysm or dissection. Stable atherosclerosis. Mediastinum/Nodes: No enlarged mediastinal, hilar, or axillary lymph nodes. Thyroid gland, trachea, and esophagus demonstrate no significant findings. Lungs/Pleura: Severe emphysema unchanged. Patchy ground-glass airspace disease again identified within the mid to lower lung zones, greatest within the lingula and bilateral lower lobes. Areas of cavitation seen on the prior study are no longer evident. No effusion or pneumothorax. Central airways are patent. Stable 4 mm right upper lobe pulmonary nodule, reference image 58/3. Musculoskeletal: Diffuse bony sclerosis is again identified, please correlate with any underlying history of hyperparathyroidism, myelofibrosis, or other metabolic abnormality. No acute or destructive bony lesion. Reconstructed images demonstrate no additional findings. Review of the MIP images confirms the above findings. CT ABDOMEN and PELVIS FINDINGS Hepatobiliary: No focal liver abnormality is seen. No gallstones, gallbladder wall thickening, or biliary dilatation. Pancreas: Unremarkable. No pancreatic ductal dilatation or surrounding inflammatory changes. Spleen: Spleen is enlarged measuring 15.8 cm in craniocaudal length. No focal parenchymal abnormality. Adrenals/Urinary Tract: 2 cm simple appearing cyst lower pole right kidney does not require follow-up. Otherwise the kidneys enhance normally and symmetrically. No urinary tract calculi or obstructive uropathy. The  adrenals and bladder are unremarkable. Stomach/Bowel: No bowel obstruction or ileus. Moderate retained stool throughout the colon, greatest in the rectosigmoid colon, consistent with constipation. The appendix is not identified. No bowel wall thickening or inflammatory change. Vascular/Lymphatic: Aortic atherosclerosis. Retroaortic left renal vein incidentally noted, a frequent anatomic variant. No pathologic adenopathy within the abdomen or pelvis. Reproductive: Prostate is unremarkable. Other: No free fluid or free intraperitoneal gas. No abdominal wall hernia. Musculoskeletal: Diffuse bony sclerosis again noted, please  correlate with any history of underlying hyperparathyroidism, myelofibrosis, or metabolic abnormality. No acute or destructive bony lesion. Postsurgical changes from prior L4-S1 fusion. Reconstructed images demonstrate no additional findings. Review of the MIP images confirms the above findings. IMPRESSION: 1. No evidence of pulmonary embolus. 2. Bibasilar ground-glass airspace disease superimposed upon background emphysema, most consistent with bibasilar pneumonia or aspiration. 3. Moderate retained stool throughout the colon consistent with constipation. No bowel obstruction or ileus. 4. Splenomegaly. 5. Aortic Atherosclerosis (ICD10-I70.0) and Emphysema (ICD10-J43.9). Electronically Signed   By: Randa Ngo M.D.   On: 01/28/2022 16:56   DG Chest 2 View  Result Date: 01/28/2022 CLINICAL DATA:  Shortness of breath, right rib pain EXAM: CHEST - 2 VIEW COMPARISON:  11/10/2021 FINDINGS: Heart and mediastinal contours are within normal limits. No focal opacities or effusions. No acute bony abnormality. Diffuse sclerosis throughout the osseous structures compatible with renal osteodystrophy, unchanged. IMPRESSION: No active cardiopulmonary disease. Electronically Signed   By: Rolm Baptise M.D.   On: 01/28/2022 14:06    Assessment and Plan Henry Owens is a 61 y.o. male with medical  history significant of osteoarthritis, anxiety disorder, chronic low back pain, history of opiate overdose with aspiration pneumonia, previous cavitary lesions who presented to the ER with worsening shortness of breath.  He was apparently seen back in September placed on steroids at the time. CT abdomen pelvis with contrast as well as CT angiogram showed no PE but bibasilar groundglass airspace disease on emphysema bibasilar pneumonia or aspiration.  Also persistent stool burden consistent with constipation.  bibasilar pneumonia:recurrent --  Patient will be admitted and initiated on IV antibiotics, steroids as well as breathing treatment.  -- Blood cultures negative --Dr Lanney Gins to see pt    opiate abuse with prior overdose: We will be careful with medications and avoid withdrawal symptoms.  Check drug screen    AKI: Hydrate and monitor renal function   tobacco abuse: Continue nicotine patch and tobacco cessation counseling    acute respiratory failure with hypoxia: Patient is now requiring 2 L of oxygen.  Most likely combination of COPD and pneumonia.  --assess for home oxygen use   COPD exacerbation: No formal diagnosis but patient has COPD on CT findings.  Continue treatment as above.  Chronic back pain--per pt PCP prescribes oxycodone      Advance Care Planning:   Code Status: Prior full code   Consults: pulmonary   Family Communication: No family at bedside   Procedures:none Family communication :wife DVT Prophylaxis :lovenox Level of care: Telemetry Medical Status is: Inpatient Remains inpatient appropriate because: PNA    TOTAL TIME TAKING CARE OF THIS PATIENT: 35 minutes.  >50% time spent on counselling and coordination of care  Note: This dictation was prepared with Dragon dictation along with smaller phrase technology. Any transcriptional errors that result from this process are unintentional.  Fritzi Mandes M.D    [facility]   CC: Primary care  physician; Albina Billet, MD

## 2022-01-30 ENCOUNTER — Inpatient Hospital Stay
Admit: 2022-01-30 | Discharge: 2022-01-30 | Disposition: A | Discharge status: Home / Self Care | Payer: Medicaid Other | Attending: Pulmonary Disease | Admitting: Pulmonary Disease

## 2022-01-30 DIAGNOSIS — J9601 Acute respiratory failure with hypoxia: Secondary | ICD-10-CM | POA: Diagnosis not present

## 2022-01-30 DIAGNOSIS — I5031 Acute diastolic (congestive) heart failure: Secondary | ICD-10-CM | POA: Diagnosis not present

## 2022-01-30 LAB — ECHOCARDIOGRAM COMPLETE
AR max vel: 3.47 cm2
AV Area VTI: 3.35 cm2
AV Area mean vel: 3.15 cm2
AV Mean grad: 4 mmHg
AV Peak grad: 7.6 mmHg
Ao pk vel: 1.38 m/s
Area-P 1/2: 3.89 cm2
Height: 70 in
S' Lateral: 3.3 cm
Weight: 2960 oz

## 2022-01-30 LAB — AST: AST: 17 U/L (ref 15–41)

## 2022-01-30 LAB — BRAIN NATRIURETIC PEPTIDE: B Natriuretic Peptide: 65.2 pg/mL (ref 0.0–100.0)

## 2022-01-30 LAB — PROCALCITONIN: Procalcitonin: 0.26 ng/mL

## 2022-01-30 MED ORDER — AZITHROMYCIN 500 MG PO TABS: 500.0000 mg | ORAL_TABLET | Freq: Every day | ORAL | 0 refills | Status: DC

## 2022-01-30 MED ORDER — ACETAMINOPHEN 325 MG PO TABS
650.0000 mg | ORAL_TABLET | Freq: Four times a day (QID) | ORAL | Status: DC | PRN
  Filled 2022-01-30: qty 2

## 2022-01-30 MED ORDER — FUROSEMIDE 10 MG/ML IJ SOLN
20.0000 mg | Freq: Every day | INTRAMUSCULAR | Status: DC
  Administered 2022-01-30: 20 mg via INTRAVENOUS
  Filled 2022-01-30: qty 2

## 2022-01-30 MED ORDER — FUROSEMIDE 20 MG PO TABS: 20.0000 mg | ORAL_TABLET | Freq: Every day | ORAL | 0 refills | Status: DC

## 2022-01-30 NOTE — Progress Notes (Signed)
Patient stable. PIVs removed. Adequate for discharge.

## 2022-01-30 NOTE — Discharge Instructions (Signed)
Use your inhalers and nebulizer as before °

## 2022-01-30 NOTE — Plan of Care (Signed)
Patient stable for DC. No falls this shift. PRN pain meds given. Ambulatory. On RA. VSS.

## 2022-01-30 NOTE — Consult Note (Signed)
PULMONOLOGY         Date: 01/30/2022,   MRN# [id number] Henry Owens 27-Apr-1960     AdmissionWeight: 83.9 kg                 CurrentWeight: 83.9 kg  Referring provider: Dr. Posey Pronto   CHIEF COMPLAINT:   Recurrent pneumonia with readmission   HISTORY OF PRESENT ILLNESS   This is a pleasant 61 year old male with history of anxiety disorder, chronic recurrent bronchitis, history of osteoarthritis and influenza who came in for refractory pneumonia failure of outpatient therapy.  Patient had been hospitalized few months ago with requirement for ICU admission endotracheal intubation with mechanical ventilation.  Was also able to evaluate patient in clinic with ongoing cough, mucus plugging and dyspnea.  He was prescribed Bactrim therapy on outpatient for 21 days however continues to have productive cough and chest discomfort with acute on chronic hypoxemic respiratory failure.  He came in due to ongoing respiratory symptoms with findings suggestive of active lower respiratory tract infection is currently being treated empirically for community-acquired pneumonia. Wife states patient was getting confused at home with shortness of breath but had pain on right side of chest.  Patient CT chest performed on arrival with PE protocol.  I reviewed the study independently.  There are no findings to suggest pneumonia and no consolidated infiltrate however there are bilateral interstitial opacities which are posterior basal predominant with a anterior-posterior gradient.  This is suggestive of positional effect with likely CHF as the main culprit on differential.  There also very obvious centrilobular emphysematous changes which are worse at the apices and upper lobes bilaterally.  Bronchitic changes are noted which are more pronounced due to surrounding interstitial infiltrates.  He had an echo done in June which shows borderline normal findings.   PAST MEDICAL HISTORY   Past Medical  History:  Diagnosis Date   Anxiety    Arthritis    Flu 07/2017     SURGICAL HISTORY   Past Surgical History:  Procedure Laterality Date   APPENDECTOMY  1975   BACK SURGERY     KNEE ARTHROSCOPY WITH MEDIAL MENISECTOMY Right 08/20/2017   Procedure: KNEE ARTHROSCOPY WITH PARTIAL MEDIAL AND LATERAL MENISECTOMY,PARTIAL SYNOVECTOMY,CHONDROPLASTY, LOOSE BODY REMOVAL;  Surgeon: Lovell Sheehan, MD;  Location: [facility] ORS;  Service: Orthopedics;  Laterality: Right;   [place], 2000     FAMILY HISTORY   Family History  Problem Relation Age of Onset   Bladder Cancer Neg Hx    Prostate cancer Neg Hx    Hematuria Neg Hx      SOCIAL HISTORY   Social History   Tobacco Use   Smoking status: Every Day    Packs/day: 1.50    Years: 25.00    Total pack years: 37.50    Types: Cigarettes   Smokeless tobacco: Never  Vaping Use   Vaping Use: Never used  Substance Use Topics   Alcohol use: No   Drug use: Not Currently     MEDICATIONS    Home Medication:    Current Medication:  Current Facility-Administered Medications:    albuterol (PROVENTIL) (2.5 MG/3ML) 0.083% nebulizer solution 2.5 mg, 2.5 mg, Nebulization, Q6H PRN, Jonelle Sidle, Mohammad L, MD   ALPRAZolam Duanne Moron) tablet 0.25 mg, 0.25 mg, Oral, TID PRN, Gala Romney L, MD, 0.25 mg at 01/29/22 2141   azithromycin (ZITHROMAX) tablet 500 mg, 500 mg, Oral, q1800, Benita Gutter, RPH, 500 mg at 01/29/22  1803   cefTRIAXone (ROCEPHIN) 2 g in sodium chloride 0.9 % 100 mL IVPB, 2 g, Intravenous, Q24H, Garba, Mohammad L, MD, Last Rate: 200 mL/hr at 01/29/22 1851, 2 g at 01/29/22 1851   docusate (COLACE) 50 MG/5ML liquid 50 mg, 50 mg, Oral, BID, Jonelle Sidle, Mohammad L, MD, 50 mg at 01/29/22 2141   enoxaparin (LOVENOX) injection 40 mg, 40 mg, Subcutaneous, Q24H, Garba, Mohammad L, MD, 40 mg at 01/29/22 2142   fluticasone (FLONASE) 50 MCG/ACT nasal spray 1 spray, 1 spray, Each Nare, Daily PRN, Elwyn Reach, MD   gabapentin  (NEURONTIN) capsule 600 mg, 600 mg, Oral, TID, Gala Romney L, MD, 600 mg at 01/29/22 2141   ketorolac (TORADOL) 15 MG/ML injection 15 mg, 15 mg, Intravenous, Q6H, Garba, Mohammad L, MD, 15 mg at 01/30/22 0548   lisinopril (ZESTRIL) tablet 10 mg, 10 mg, Oral, Daily, Jonelle Sidle, Mohammad L, MD, 10 mg at 01/29/22 5465   mometasone-formoterol (DULERA) 100-5 MCG/ACT inhaler 2 puff, 2 puff, Inhalation, BID, Elwyn Reach, MD, 2 puff at 01/29/22 2037   oxyCODONE (Oxy IR/ROXICODONE) immediate release tablet 5 mg, 5 mg, Oral, Q6H PRN, Fritzi Mandes, MD, 5 mg at 01/30/22 0432   pantoprazole (PROTONIX) EC tablet 40 mg, 40 mg, Oral, Daily, Jonelle Sidle, Mohammad L, MD, 40 mg at 01/29/22 0354   polyethylene glycol (MIRALAX / GLYCOLAX) packet 17 g, 17 g, Oral, Daily PRN, Jonelle Sidle, Mohammad L, MD   predniSONE (DELTASONE) tablet 10 mg, 10 mg, Oral, Daily, Fritzi Mandes, MD, 10 mg at 01/29/22 1037   sertraline (ZOLOFT) tablet 150 mg, 150 mg, Oral, Daily, Jonelle Sidle, Mohammad L, MD, 150 mg at 01/29/22 6568    ALLERGIES   Wound dressing adhesive, Azithromycin, and Penicillin g     REVIEW OF SYSTEMS    Review of Systems:  Gen:  Denies  fever, sweats, chills weigh loss  HEENT: Denies blurred vision, double vision, ear pain, eye pain, hearing loss, nose bleeds, sore throat Cardiac:  No dizziness, chest pain or heaviness, chest tightness,edema Resp:   reports dyspnea chronically  Gi: Denies swallowing difficulty, stomach pain, nausea or vomiting, diarrhea, constipation, bowel incontinence Gu:  Denies bladder incontinence, burning urine Ext:   Denies Joint pain, stiffness or swelling Skin: Denies  skin rash, easy bruising or bleeding or hives Endoc:  Denies polyuria, polydipsia , polyphagia or weight change Psych:   Denies depression, insomnia or hallucinations   Other:  All other systems negative   VS: BP 137/80 (BP Location: Right Arm)   Pulse 63   Temp 97.8 F (36.6 C) (Oral)   Resp 20   Ht '5\' 10"'$  (1.778 m)    Wt 83.9 kg   SpO2 99%   BMI 26.54 kg/m      PHYSICAL EXAM    GENERAL:NAD, no fevers, chills, no weakness no fatigue HEAD: Normocephalic, atraumatic.  EYES: Pupils equal, round, reactive to light. Extraocular muscles intact. No scleral icterus.  MOUTH: Moist mucosal membrane. Dentition intact. No abscess noted.  EAR, NOSE, THROAT: Clear without exudates. No external lesions.  NECK: Supple. No thyromegaly. No nodules. No JVD.  PULMONARY: decreased breath sounds with mild rhonchi worse at bases bilaterally.  CARDIOVASCULAR: S1 and S2. Regular rate and rhythm. No murmurs, rubs, or gallops. No edema. Pedal pulses 2+ bilaterally.  GASTROINTESTINAL: Soft, nontender, nondistended. No masses. Positive bowel sounds. No hepatosplenomegaly.  MUSCULOSKELETAL: No swelling, clubbing, or edema. Range of motion full in all extremities.  NEUROLOGIC: Cranial nerves II through XII are intact. No gross  focal neurological deficits. Sensation intact. Reflexes intact.  SKIN: No ulceration, lesions, rashes, or cyanosis. Skin warm and dry. Turgor intact.  PSYCHIATRIC: Mood, affect within normal limits. The patient is awake, alert and oriented x 3. Insight, judgment intact.       IMAGING       ASSESSMENT/PLAN   Acute on chronic hypoxemic respiratory failure  - Patient has CT chest with no PE but does have antero-posterior gradient of interstitial infiltrates suggestive of underlying edema  - would send home with '20mg'$  lasix until we can see him in 2 weeks  - would recheck BNP and AST for cardiac biomarker activity   - outpatient cardiology evaluation and either outpatient TTE or inpatient.     Centrilobular emphysema - patient may need oxygen for home use - he should continue albuterol and finish generic COPD treatment with antibiotics and prednisone             Thank you for allowing me to participate in the care of this patient.   Patient/Family are satisfied with care plan and all  questions have been answered.    Provider disclosure: Patient with at least one acute or chronic illness or injury that poses a threat to life or bodily function and is being managed actively during this encounter.  All of the below services have been performed independently by signing provider:  review of prior documentation from internal and or external health records.  Review of previous and current lab results.  Interview and comprehensive assessment during patient visit today. Review of current and previous chest radiographs/CT scans. Discussion of management and test interpretation with health care team and patient/family.   This document was prepared using Dragon voice recognition software and may include unintentional dictation errors.     Ottie Glazier, M.D.  Division of Pulmonary & Critical Care Medicine

## 2022-01-30 NOTE — Discharge Summary (Signed)
Physician Discharge Summary   Patient: Henry Owens MRN: [id number] DOB: 11/08/1960  Admit date:     01/28/2022  Discharge date: 01/30/22  Discharge Physician: Fritzi Mandes   PCP: Albina Billet, MD   Recommendations at discharge:   follow-up Dr Lanney Gins in 1 to 2 weeks use your inhalers and nebulizer as before   Discharge Diagnoses: acute congestive heart failure suspect diastolic. Echo pending acute bronchitis chronic emphysema  Hospital Course: Henry Owens is a 61 y.o. male with medical history significant of osteoarthritis, anxiety disorder, chronic low back pain, history of opiate overdose with aspiration pneumonia, previous cavitary lesions who presented to the ER with worsening shortness of breath.  He was apparently seen back in September placed on steroids at the time. CT abdomen pelvis with contrast as well as CT angiogram showed no PE but bibasilar groundglass airspace disease on emphysema bibasilar pneumonia or aspiration.  Also persistent stool burden consistent with constipation.   Bilateral lung infiltrate likely new CHF acute, likley diastolic per pulmonary eval Chronic Bronchitis Severe Emphysema --EF pending --echo done --  Patient will be admitted and initiated on IV antibiotics, steroids as well as breathing treatment.--change to po zithromax and po steroids (10 mg qd) -- Blood cultures negative --Dr Lanney Gins recommends repeat echo, lasix 20 mg qd --BNP 69 --diuresed well with IV lasix x1--1300cc -- Hypoxia resolved. Patient is on room air 97%   opiate abuse with prior overdose: We will be careful with medications and avoid withdrawal symptoms.  Check drug screen    AKI on CKD II  --stable    tobacco abuse:pt reports he quit in March    COPD exacerbation: No formal diagnosis but patient has COPD on CT findings.  Continue treatment as above.   Chronic back pain--per pt PCP prescribes oxycodone  Overall hemodynamically stable. Will  discharged to home. Patient is agreeable.      Advance Care Planning:   Code Status: Prior full code   Consults: pulmonary   Family Communication: No family at bedside     Procedures:none Family communication :wife DVT Prophylaxis :lovenox     Pain control - [place] Controlled Substance Reporting System database was reviewed. and patient was instructed, not to drive, operate heavy machinery, perform activities at heights, swimming or participation in water activities or provide baby-sitting services while on Pain, Sleep and Anxiety Medications; until their outpatient Physician has advised to do so again. Also recommended to not to take more than prescribed Pain, Sleep and Anxiety Medications.  Consultants: pulmonary   Disposition: Home Diet recommendation:  Cardiac diet DISCHARGE MEDICATION: Allergies as of 01/30/2022       Reactions   Wound Dressing Adhesive Other (See Comments)   BLISTER   Azithromycin Rash   Penicillin G Rash   Has patient had a PCN reaction causing immediate rash, facial/tongue/throat swelling, SOB or lightheadedness with hypotension: Yes Has patient had a PCN reaction causing severe rash involving mucus membranes or skin necrosis: No Has patient had a PCN reaction that required hospitalization: No Has patient had a PCN reaction occurring within the last 10 years: No If all of the above answers are "NO", then may proceed with Cephalosporin use.        Medication List     STOP taking these medications    sulfamethoxazole-trimethoprim 400-80 MG tablet Commonly known as: BACTRIM       TAKE these medications    albuterol (2.5 MG/3ML) 0.083% nebulizer solution Commonly known as: PROVENTIL Take  2.5 mg by nebulization every 6 (six) hours as needed for wheezing or shortness of breath.   ALPRAZolam 0.25 MG tablet Commonly known as: XANAX Take 0.25 mg by mouth 3 (three) times daily as needed for anxiety or sleep.   azithromycin 500 MG  tablet Commonly known as: ZITHROMAX Take 1 tablet (500 mg total) by mouth daily at 6 PM.   budesonide-formoterol 80-4.5 MCG/ACT inhaler Commonly known as: SYMBICORT Inhale 2 puffs into the lungs in the morning and at bedtime.   docusate sodium 50 MG capsule Commonly known as: COLACE Take 50 mg by mouth 2 (two) times daily.   fluticasone 50 MCG/ACT nasal spray Commonly known as: FLONASE Place 1 spray into both nostrils daily as needed for allergies or rhinitis.   furosemide 20 MG tablet Commonly known as: Lasix Take 1 tablet (20 mg total) by mouth daily.   gabapentin 300 MG capsule Commonly known as: NEURONTIN Take 600 mg by mouth 3 (three) times daily.   lisinopril 10 MG tablet Commonly known as: ZESTRIL Take 10 mg by mouth daily.   naloxone 4 MG/0.1ML Liqd nasal spray kit Commonly known as: NARCAN As needed For opioid reversal (unresponsiveness) intranasal spray   pantoprazole 40 MG tablet Commonly known as: Protonix Take 1 tablet (40 mg total) by mouth daily.   polyethylene glycol 17 g packet Commonly known as: MIRALAX / GLYCOLAX Take 17 g by mouth daily as needed for moderate constipation.   predniSONE 10 MG tablet Commonly known as: DELTASONE Take 10 mg by mouth daily.   sertraline 100 MG tablet Commonly known as: ZOLOFT Take 150 mg by mouth daily.        Follow-up Information     Albina Billet, MD. Schedule an appointment as soon as possible for a visit in 1 week(s).   Specialty: Internal Medicine Contact information: [address]   [place] [address] [phone]         Ottie Glazier, MD. Schedule an appointment as soon as possible for a visit in 1 week(s).   Specialty: Pulmonary Disease Why: copd/?chf Contact information: [place] [address] [phone]                Discharge Exam: Danley Danker Weights   01/28/22 1341  Weight: 83.9 kg     Condition at discharge: fair  The results of  significant diagnostics from this hospitalization (including imaging, microbiology, ancillary and laboratory) are listed below for reference.   Imaging Studies: CT Angio Chest PE W and/or Wo Contrast  Result Date: 01/28/2022 CLINICAL DATA:  Short of breath, right rib pain, febrile, abdominal pain EXAM: CT ANGIOGRAPHY CHEST CT ABDOMEN AND PELVIS WITH CONTRAST TECHNIQUE: Multidetector CT imaging of the chest was performed using the standard protocol during bolus administration of intravenous contrast. Multiplanar CT image reconstructions and MIPs were obtained to evaluate the vascular anatomy. Multidetector CT imaging of the abdomen and pelvis was performed using the standard protocol during bolus administration of intravenous contrast. RADIATION DOSE REDUCTION: This exam was performed according to the departmental dose-optimization program which includes automated exposure control, adjustment of the mA and/or kV according to patient size and/or use of iterative reconstruction technique. CONTRAST:  155m OMNIPAQUE IOHEXOL 350 MG/ML SOLN COMPARISON:  01/28/2022, 11/10/2021, 07/05/2021 FINDINGS: CTA CHEST FINDINGS Cardiovascular: This is a technically adequate evaluation of the pulmonary vasculature. No filling defects or pulmonary emboli. The heart is unremarkable without pericardial effusion. No evidence of thoracic aortic aneurysm or dissection. Stable atherosclerosis.  Mediastinum/Nodes: No enlarged mediastinal, hilar, or axillary lymph nodes. Thyroid gland, trachea, and esophagus demonstrate no significant findings. Lungs/Pleura: Severe emphysema unchanged. Patchy ground-glass airspace disease again identified within the mid to lower lung zones, greatest within the lingula and bilateral lower lobes. Areas of cavitation seen on the prior study are no longer evident. No effusion or pneumothorax. Central airways are patent. Stable 4 mm right upper lobe pulmonary nodule, reference image 58/3. Musculoskeletal:  Diffuse bony sclerosis is again identified, please correlate with any underlying history of hyperparathyroidism, myelofibrosis, or other metabolic abnormality. No acute or destructive bony lesion. Reconstructed images demonstrate no additional findings. Review of the MIP images confirms the above findings. CT ABDOMEN and PELVIS FINDINGS Hepatobiliary: No focal liver abnormality is seen. No gallstones, gallbladder wall thickening, or biliary dilatation. Pancreas: Unremarkable. No pancreatic ductal dilatation or surrounding inflammatory changes. Spleen: Spleen is enlarged measuring 15.8 cm in craniocaudal length. No focal parenchymal abnormality. Adrenals/Urinary Tract: 2 cm simple appearing cyst lower pole right kidney does not require follow-up. Otherwise the kidneys enhance normally and symmetrically. No urinary tract calculi or obstructive uropathy. The adrenals and bladder are unremarkable. Stomach/Bowel: No bowel obstruction or ileus. Moderate retained stool throughout the colon, greatest in the rectosigmoid colon, consistent with constipation. The appendix is not identified. No bowel wall thickening or inflammatory change. Vascular/Lymphatic: Aortic atherosclerosis. Retroaortic left renal vein incidentally noted, a frequent anatomic variant. No pathologic adenopathy within the abdomen or pelvis. Reproductive: Prostate is unremarkable. Other: No free fluid or free intraperitoneal gas. No abdominal wall hernia. Musculoskeletal: Diffuse bony sclerosis again noted, please correlate with any history of underlying hyperparathyroidism, myelofibrosis, or metabolic abnormality. No acute or destructive bony lesion. Postsurgical changes from prior L4-S1 fusion. Reconstructed images demonstrate no additional findings. Review of the MIP images confirms the above findings. IMPRESSION: 1. No evidence of pulmonary embolus. 2. Bibasilar ground-glass airspace disease superimposed upon background emphysema, most consistent with  bibasilar pneumonia or aspiration. 3. Moderate retained stool throughout the colon consistent with constipation. No bowel obstruction or ileus. 4. Splenomegaly. 5. Aortic Atherosclerosis (ICD10-I70.0) and Emphysema (ICD10-J43.9). Electronically Signed   By: Randa Ngo M.D.   On: 01/28/2022 16:56   CT ABDOMEN PELVIS W CONTRAST  Result Date: 01/28/2022 CLINICAL DATA:  Short of breath, right rib pain, febrile, abdominal pain EXAM: CT ANGIOGRAPHY CHEST CT ABDOMEN AND PELVIS WITH CONTRAST TECHNIQUE: Multidetector CT imaging of the chest was performed using the standard protocol during bolus administration of intravenous contrast. Multiplanar CT image reconstructions and MIPs were obtained to evaluate the vascular anatomy. Multidetector CT imaging of the abdomen and pelvis was performed using the standard protocol during bolus administration of intravenous contrast. RADIATION DOSE REDUCTION: This exam was performed according to the departmental dose-optimization program which includes automated exposure control, adjustment of the mA and/or kV according to patient size and/or use of iterative reconstruction technique. CONTRAST:  181m OMNIPAQUE IOHEXOL 350 MG/ML SOLN COMPARISON:  01/28/2022, 11/10/2021, 07/05/2021 FINDINGS: CTA CHEST FINDINGS Cardiovascular: This is a technically adequate evaluation of the pulmonary vasculature. No filling defects or pulmonary emboli. The heart is unremarkable without pericardial effusion. No evidence of thoracic aortic aneurysm or dissection. Stable atherosclerosis. Mediastinum/Nodes: No enlarged mediastinal, hilar, or axillary lymph nodes. Thyroid gland, trachea, and esophagus demonstrate no significant findings. Lungs/Pleura: Severe emphysema unchanged. Patchy ground-glass airspace disease again identified within the mid to lower lung zones, greatest within the lingula and bilateral lower lobes. Areas of cavitation seen on the prior study are no longer evident. No effusion or  pneumothorax. Central airways are patent. Stable 4 mm right upper lobe pulmonary nodule, reference image 58/3. Musculoskeletal: Diffuse bony sclerosis is again identified, please correlate with any underlying history of hyperparathyroidism, myelofibrosis, or other metabolic abnormality. No acute or destructive bony lesion. Reconstructed images demonstrate no additional findings. Review of the MIP images confirms the above findings. CT ABDOMEN and PELVIS FINDINGS Hepatobiliary: No focal liver abnormality is seen. No gallstones, gallbladder wall thickening, or biliary dilatation. Pancreas: Unremarkable. No pancreatic ductal dilatation or surrounding inflammatory changes. Spleen: Spleen is enlarged measuring 15.8 cm in craniocaudal length. No focal parenchymal abnormality. Adrenals/Urinary Tract: 2 cm simple appearing cyst lower pole right kidney does not require follow-up. Otherwise the kidneys enhance normally and symmetrically. No urinary tract calculi or obstructive uropathy. The adrenals and bladder are unremarkable. Stomach/Bowel: No bowel obstruction or ileus. Moderate retained stool throughout the colon, greatest in the rectosigmoid colon, consistent with constipation. The appendix is not identified. No bowel wall thickening or inflammatory change. Vascular/Lymphatic: Aortic atherosclerosis. Retroaortic left renal vein incidentally noted, a frequent anatomic variant. No pathologic adenopathy within the abdomen or pelvis. Reproductive: Prostate is unremarkable. Other: No free fluid or free intraperitoneal gas. No abdominal wall hernia. Musculoskeletal: Diffuse bony sclerosis again noted, please correlate with any history of underlying hyperparathyroidism, myelofibrosis, or metabolic abnormality. No acute or destructive bony lesion. Postsurgical changes from prior L4-S1 fusion. Reconstructed images demonstrate no additional findings. Review of the MIP images confirms the above findings. IMPRESSION: 1. No evidence  of pulmonary embolus. 2. Bibasilar ground-glass airspace disease superimposed upon background emphysema, most consistent with bibasilar pneumonia or aspiration. 3. Moderate retained stool throughout the colon consistent with constipation. No bowel obstruction or ileus. 4. Splenomegaly. 5. Aortic Atherosclerosis (ICD10-I70.0) and Emphysema (ICD10-J43.9). Electronically Signed   By: Randa Ngo M.D.   On: 01/28/2022 16:56   DG Chest 2 View  Result Date: 01/28/2022 CLINICAL DATA:  Shortness of breath, right rib pain EXAM: CHEST - 2 VIEW COMPARISON:  11/10/2021 FINDINGS: Heart and mediastinal contours are within normal limits. No focal opacities or effusions. No acute bony abnormality. Diffuse sclerosis throughout the osseous structures compatible with renal osteodystrophy, unchanged. IMPRESSION: No active cardiopulmonary disease. Electronically Signed   By: Rolm Baptise M.D.   On: 01/28/2022 14:06    Microbiology: Results for orders placed or performed during the hospital encounter of 01/28/22  Blood culture (routine x 2)     Status: None (Preliminary result)   Collection Time: 01/28/22  2:16 PM   Specimen: BLOOD RIGHT ARM  Result Value Ref Range Status   Specimen Description BLOOD RIGHT ARM  Final   Special Requests   Final    BOTTLES DRAWN AEROBIC AND ANAEROBIC Blood Culture results may not be optimal due to an inadequate volume of blood received in culture bottles   Culture   Final    NO GROWTH 2 DAYS Performed at [facility], [address] [place], [place] [address]    Report Status PENDING  Incomplete  Blood culture (routine x 2)     Status: None (Preliminary result)   Collection Time: 01/28/22  2:16 PM   Specimen: BLOOD  Result Value Ref Range Status   Specimen Description BLOOD LEFT ANTECUBITAL  Final   Special Requests   Final    BOTTLES DRAWN AEROBIC AND ANAEROBIC Blood Culture adequate volume   Culture   Final    NO GROWTH 2 DAYS Performed at [facility],  [address] [place], [place] [address]  Report Status PENDING  Incomplete    Labs: CBC: Recent Labs  Lab 01/28/22 1344 01/29/22 0544  WBC 13.7* 7.3  HGB 14.0 11.5*  HCT 43.8 36.7*  MCV 88.8 91.1  PLT 179 271*   Basic Metabolic Panel: Recent Labs  Lab 01/28/22 1344 01/28/22 1945 01/29/22 0544  NA 138  --  139  K 4.5  --  5.1  CL 106  --  105  CO2 26  --  26  GLUCOSE 112*  --  101*  BUN 11  --  16  CREATININE 1.49* 1.39* 1.61*  CALCIUM 9.6  --  9.5   Liver Function Tests: Recent Labs  Lab 01/28/22 1416 01/29/22 0544 01/30/22 0948  AST 21 15 17   ALT 27 23  --   ALKPHOS 145* 121  --   BILITOT 0.7 0.5  --   PROT 7.3 6.6  --   ALBUMIN 4.6 3.8  --    CBG: No results for input(s): "GLUCAP" in the last 168 hours.  Discharge time spent: greater than 30 minutes.  Signed: Fritzi Mandes, MD [facility] 01/30/2022

## 2022-02-02 LAB — CULTURE, BLOOD (ROUTINE X 2)
Culture: NO GROWTH
Culture: NO GROWTH
Special Requests: ADEQUATE

## 2022-02-06 ENCOUNTER — Other Ambulatory Visit: Payer: Self-pay

## 2022-02-06 ENCOUNTER — Emergency Department: Payer: Medicaid Other

## 2022-02-06 ENCOUNTER — Inpatient Hospital Stay
Admission: EM | Admit: 2022-02-06 | Discharge: 2022-02-09 | DRG: 194 | Disposition: A | Discharge status: Home / Self Care | Payer: Medicaid Other | Attending: Internal Medicine | Admitting: Internal Medicine

## 2022-02-06 DIAGNOSIS — R0602 Shortness of breath: Secondary | ICD-10-CM | POA: Diagnosis not present

## 2022-02-06 DIAGNOSIS — I130 Hypertensive heart and chronic kidney disease with heart failure and stage 1 through stage 4 chronic kidney disease, or unspecified chronic kidney disease: Secondary | ICD-10-CM | POA: Diagnosis present

## 2022-02-06 DIAGNOSIS — D696 Thrombocytopenia, unspecified: Secondary | ICD-10-CM | POA: Diagnosis present

## 2022-02-06 DIAGNOSIS — Z880 Allergy status to penicillin: Secondary | ICD-10-CM

## 2022-02-06 DIAGNOSIS — J432 Centrilobular emphysema: Secondary | ICD-10-CM | POA: Diagnosis present

## 2022-02-06 DIAGNOSIS — Z1152 Encounter for screening for COVID-19: Secondary | ICD-10-CM

## 2022-02-06 DIAGNOSIS — N1411 Contrast-induced nephropathy: Secondary | ICD-10-CM | POA: Diagnosis not present

## 2022-02-06 DIAGNOSIS — J441 Chronic obstructive pulmonary disease with (acute) exacerbation: Secondary | ICD-10-CM

## 2022-02-06 DIAGNOSIS — R531 Weakness: Secondary | ICD-10-CM

## 2022-02-06 DIAGNOSIS — R0600 Dyspnea, unspecified: Principal | ICD-10-CM

## 2022-02-06 DIAGNOSIS — F17210 Nicotine dependence, cigarettes, uncomplicated: Secondary | ICD-10-CM | POA: Diagnosis present

## 2022-02-06 DIAGNOSIS — I10 Essential (primary) hypertension: Secondary | ICD-10-CM

## 2022-02-06 DIAGNOSIS — Z7951 Long term (current) use of inhaled steroids: Secondary | ICD-10-CM

## 2022-02-06 DIAGNOSIS — J440 Chronic obstructive pulmonary disease with acute lower respiratory infection: Secondary | ICD-10-CM | POA: Diagnosis present

## 2022-02-06 DIAGNOSIS — N179 Acute kidney failure, unspecified: Secondary | ICD-10-CM | POA: Diagnosis not present

## 2022-02-06 DIAGNOSIS — Z79899 Other long term (current) drug therapy: Secondary | ICD-10-CM

## 2022-02-06 DIAGNOSIS — R0902 Hypoxemia: Secondary | ICD-10-CM | POA: Diagnosis present

## 2022-02-06 DIAGNOSIS — G8929 Other chronic pain: Secondary | ICD-10-CM | POA: Diagnosis not present

## 2022-02-06 DIAGNOSIS — Z881 Allergy status to other antibiotic agents status: Secondary | ICD-10-CM

## 2022-02-06 DIAGNOSIS — J189 Pneumonia, unspecified organism: Secondary | ICD-10-CM | POA: Diagnosis not present

## 2022-02-06 DIAGNOSIS — F32A Depression, unspecified: Secondary | ICD-10-CM | POA: Diagnosis present

## 2022-02-06 DIAGNOSIS — J690 Pneumonitis due to inhalation of food and vomit: Secondary | ICD-10-CM | POA: Diagnosis present

## 2022-02-06 DIAGNOSIS — N1831 Chronic kidney disease, stage 3a: Secondary | ICD-10-CM | POA: Diagnosis present

## 2022-02-06 DIAGNOSIS — F419 Anxiety disorder, unspecified: Secondary | ICD-10-CM | POA: Diagnosis present

## 2022-02-06 DIAGNOSIS — Z8701 Personal history of pneumonia (recurrent): Secondary | ICD-10-CM

## 2022-02-06 DIAGNOSIS — Z765 Malingerer [conscious simulation]: Secondary | ICD-10-CM

## 2022-02-06 DIAGNOSIS — E871 Hypo-osmolality and hyponatremia: Secondary | ICD-10-CM | POA: Diagnosis present

## 2022-02-06 DIAGNOSIS — N2581 Secondary hyperparathyroidism of renal origin: Secondary | ICD-10-CM | POA: Diagnosis present

## 2022-02-06 DIAGNOSIS — E875 Hyperkalemia: Secondary | ICD-10-CM | POA: Diagnosis not present

## 2022-02-06 DIAGNOSIS — D631 Anemia in chronic kidney disease: Secondary | ICD-10-CM | POA: Diagnosis present

## 2022-02-06 DIAGNOSIS — I5032 Chronic diastolic (congestive) heart failure: Secondary | ICD-10-CM | POA: Diagnosis present

## 2022-02-06 DIAGNOSIS — Z91048 Other nonmedicinal substance allergy status: Secondary | ICD-10-CM

## 2022-02-06 DIAGNOSIS — K219 Gastro-esophageal reflux disease without esophagitis: Secondary | ICD-10-CM | POA: Diagnosis present

## 2022-02-06 LAB — COMPREHENSIVE METABOLIC PANEL
ALT: 23 U/L (ref 0–44)
AST: 21 U/L (ref 15–41)
Albumin: 4.2 g/dL (ref 3.5–5.0)
Alkaline Phosphatase: 134 U/L — ABNORMAL HIGH (ref 38–126)
Anion gap: 7 (ref 5–15)
BUN: 14 mg/dL (ref 6–20)
CO2: 22 mmol/L (ref 22–32)
Calcium: 9.2 mg/dL (ref 8.9–10.3)
Chloride: 104 mmol/L (ref 98–111)
Creatinine, Ser: 1.78 mg/dL — ABNORMAL HIGH (ref 0.61–1.24)
GFR, Estimated: 43 mL/min — ABNORMAL LOW (ref >60)
Glucose, Bld: 104 mg/dL — ABNORMAL HIGH (ref 70–99)
Potassium: 4.4 mmol/L (ref 3.5–5.1)
Sodium: 133 mmol/L — ABNORMAL LOW (ref 135–145)
Total Bilirubin: 0.8 mg/dL (ref 0.3–1.2)
Total Protein: 7.2 g/dL (ref 6.5–8.1)

## 2022-02-06 LAB — CBC WITH DIFFERENTIAL/PLATELET
Abs Immature Granulocytes: 0.04 10*3/uL (ref 0.00–0.07)
Basophils Absolute: 0 10*3/uL (ref 0.0–0.1)
Basophils Relative: 0 %
Eosinophils Absolute: 0.1 10*3/uL (ref 0.0–0.5)
Eosinophils Relative: 1 %
HCT: 37 % — ABNORMAL LOW (ref 39.0–52.0)
Hemoglobin: 12.3 g/dL — ABNORMAL LOW (ref 13.0–17.0)
Immature Granulocytes: 0 %
Lymphocytes Relative: 12 %
Lymphs Abs: 1.2 10*3/uL (ref 0.7–4.0)
MCH: 28.5 pg (ref 26.0–34.0)
MCHC: 33.2 g/dL (ref 30.0–36.0)
MCV: 85.8 fL (ref 80.0–100.0)
Monocytes Absolute: 0.7 10*3/uL (ref 0.1–1.0)
Monocytes Relative: 6 %
Neutro Abs: 8.5 10*3/uL — ABNORMAL HIGH (ref 1.7–7.7)
Neutrophils Relative %: 81 %
Platelets: 170 10*3/uL (ref 150–400)
RBC: 4.31 MIL/uL (ref 4.22–5.81)
RDW: 15.9 % — ABNORMAL HIGH (ref 11.5–15.5)
WBC: 10.6 10*3/uL — ABNORMAL HIGH (ref 4.0–10.5)
nRBC: 0 % (ref 0.0–0.2)

## 2022-02-06 LAB — URINALYSIS, ROUTINE W REFLEX MICROSCOPIC
Bilirubin Urine: NEGATIVE
Glucose, UA: NEGATIVE mg/dL
Hgb urine dipstick: NEGATIVE
Ketones, ur: NEGATIVE mg/dL
Leukocytes,Ua: NEGATIVE
Nitrite: NEGATIVE
Protein, ur: NEGATIVE mg/dL
Specific Gravity, Urine: 1.008 (ref 1.005–1.030)
pH: 5 (ref 5.0–8.0)

## 2022-02-06 LAB — BRAIN NATRIURETIC PEPTIDE: B Natriuretic Peptide: 12.8 pg/mL (ref 0.0–100.0)

## 2022-02-06 LAB — D-DIMER, QUANTITATIVE: D-Dimer, Quant: <0.27 ug/mL-FEU (ref 0.00–0.50)

## 2022-02-06 LAB — LACTIC ACID, PLASMA
Lactic Acid, Venous: 1.3 mmol/L (ref 0.5–1.9)
Lactic Acid, Venous: 1.6 mmol/L (ref 0.5–1.9)

## 2022-02-06 LAB — PROCALCITONIN: Procalcitonin: 0.52 ng/mL

## 2022-02-06 LAB — MRSA NEXT GEN BY PCR, NASAL: MRSA by PCR Next Gen: NOT DETECTED

## 2022-02-06 LAB — SARS CORONAVIRUS 2 BY RT PCR: SARS Coronavirus 2 by RT PCR: NEGATIVE

## 2022-02-06 MED ORDER — DOCUSATE SODIUM 100 MG PO CAPS
100.0000 mg | ORAL_CAPSULE | Freq: Every day | ORAL | Status: DC
  Administered 2022-02-07 – 2022-02-09 (×3): 100 mg via ORAL
  Filled 2022-02-06 (×3): qty 1

## 2022-02-06 MED ORDER — DIPHENHYDRAMINE HCL 50 MG/ML IJ SOLN
25.0000 mg | Freq: Once | INTRAMUSCULAR | Status: AC
  Administered 2022-02-06: 25 mg via INTRAVENOUS
  Filled 2022-02-06: qty 1

## 2022-02-06 MED ORDER — LISINOPRIL 10 MG PO TABS
10.0000 mg | ORAL_TABLET | Freq: Every day | ORAL | Status: DC
  Administered 2022-02-07: 10 mg via ORAL
  Filled 2022-02-06: qty 1

## 2022-02-06 MED ORDER — ACETAMINOPHEN 650 MG RE SUPP: 650.0000 mg | Freq: Four times a day (QID) | RECTAL | Status: DC | PRN

## 2022-02-06 MED ORDER — LEVOFLOXACIN IN D5W 500 MG/100ML IV SOLN
500.0000 mg | Freq: Once | INTRAVENOUS | Status: DC
  Filled 2022-02-06: qty 100

## 2022-02-06 MED ORDER — POLYETHYLENE GLYCOL 3350 17 G PO PACK: 17.0000 g | PACK | Freq: Every day | ORAL | Status: DC | PRN

## 2022-02-06 MED ORDER — FLUTICASONE PROPIONATE 50 MCG/ACT NA SUSP: 1.0000 | Freq: Every day | NASAL | Status: DC | PRN

## 2022-02-06 MED ORDER — ONDANSETRON HCL 4 MG PO TABS: 4.0000 mg | ORAL_TABLET | Freq: Four times a day (QID) | ORAL | Status: DC | PRN

## 2022-02-06 MED ORDER — SERTRALINE HCL 50 MG PO TABS
150.0000 mg | ORAL_TABLET | Freq: Every day | ORAL | Status: DC
  Administered 2022-02-07 – 2022-02-09 (×3): 150 mg via ORAL
  Filled 2022-02-06 (×3): qty 3

## 2022-02-06 MED ORDER — MOMETASONE FURO-FORMOTEROL FUM 100-5 MCG/ACT IN AERO
2.0000 | INHALATION_SPRAY | Freq: Two times a day (BID) | RESPIRATORY_TRACT | Status: DC
  Administered 2022-02-07 – 2022-02-09 (×5): 2 via RESPIRATORY_TRACT
  Filled 2022-02-06: qty 8.8

## 2022-02-06 MED ORDER — IOHEXOL 350 MG/ML SOLN
60.0000 mL | Freq: Once | INTRAVENOUS | Status: AC | PRN
  Administered 2022-02-06: 60 mL via INTRAVENOUS

## 2022-02-06 MED ORDER — ALPRAZOLAM 0.25 MG PO TABS
0.2500 mg | ORAL_TABLET | Freq: Three times a day (TID) | ORAL | Status: DC | PRN
  Administered 2022-02-06 – 2022-02-09 (×4): 0.25 mg via ORAL
  Filled 2022-02-06 (×4): qty 1

## 2022-02-06 MED ORDER — LEVOFLOXACIN IN D5W 750 MG/150ML IV SOLN: 750.0000 mg | INTRAVENOUS | Status: DC

## 2022-02-06 MED ORDER — FUROSEMIDE 40 MG PO TABS
20.0000 mg | ORAL_TABLET | Freq: Every day | ORAL | Status: DC
  Administered 2022-02-07: 20 mg via ORAL
  Filled 2022-02-06: qty 1

## 2022-02-06 MED ORDER — ENOXAPARIN SODIUM 40 MG/0.4ML IJ SOSY
40.0000 mg | PREFILLED_SYRINGE | INTRAMUSCULAR | Status: DC
  Administered 2022-02-06 – 2022-02-08 (×3): 40 mg via SUBCUTANEOUS
  Filled 2022-02-06 (×3): qty 0.4

## 2022-02-06 MED ORDER — ACETAMINOPHEN 325 MG PO TABS
650.0000 mg | ORAL_TABLET | Freq: Four times a day (QID) | ORAL | Status: DC | PRN
  Administered 2022-02-07 – 2022-02-08 (×2): 650 mg via ORAL
  Filled 2022-02-06 (×2): qty 2

## 2022-02-06 MED ORDER — MORPHINE SULFATE (PF) 2 MG/ML IV SOLN
2.0000 mg | INTRAVENOUS | Status: DC | PRN
  Administered 2022-02-07 (×3): 2 mg via INTRAVENOUS
  Filled 2022-02-06 (×3): qty 1

## 2022-02-06 MED ORDER — IPRATROPIUM-ALBUTEROL 0.5-2.5 (3) MG/3ML IN SOLN: 3.0000 mL | Freq: Four times a day (QID) | RESPIRATORY_TRACT | Status: DC | PRN

## 2022-02-06 MED ORDER — PANTOPRAZOLE SODIUM 40 MG PO TBEC
40.0000 mg | DELAYED_RELEASE_TABLET | Freq: Every day | ORAL | Status: DC
  Administered 2022-02-07 – 2022-02-09 (×3): 40 mg via ORAL
  Filled 2022-02-06 (×3): qty 1

## 2022-02-06 MED ORDER — LEVOFLOXACIN IN D5W 500 MG/100ML IV SOLN
500.0000 mg | INTRAVENOUS | Status: DC
  Administered 2022-02-06: 500 mg via INTRAVENOUS

## 2022-02-06 MED ORDER — FENTANYL CITRATE PF 50 MCG/ML IJ SOSY
25.0000 ug | PREFILLED_SYRINGE | INTRAMUSCULAR | Status: DC | PRN
  Filled 2022-02-06: qty 1

## 2022-02-06 MED ORDER — ONDANSETRON HCL 4 MG/2ML IJ SOLN: 4.0000 mg | Freq: Four times a day (QID) | INTRAMUSCULAR | Status: DC | PRN

## 2022-02-06 MED ORDER — GABAPENTIN 300 MG PO CAPS
600.0000 mg | ORAL_CAPSULE | Freq: Three times a day (TID) | ORAL | Status: DC
  Administered 2022-02-06 – 2022-02-09 (×8): 600 mg via ORAL
  Filled 2022-02-06 (×8): qty 2

## 2022-02-06 MED ORDER — HYDRALAZINE HCL 10 MG PO TABS: 10.0000 mg | ORAL_TABLET | Freq: Three times a day (TID) | ORAL | Status: DC | PRN

## 2022-02-06 MED ORDER — OXYCODONE HCL 5 MG PO TABS
5.0000 mg | ORAL_TABLET | Freq: Four times a day (QID) | ORAL | Status: DC | PRN
  Administered 2022-02-06 – 2022-02-08 (×5): 5 mg via ORAL
  Filled 2022-02-06 (×6): qty 1

## 2022-02-06 NOTE — ED Provider Notes (Signed)
[facility] Provider Note    Event Date/Time   First MD Initiated Contact with Patient 02/06/22 1516     (approximate)   History   Shortness of Breath   HPI  Henry Owens is a 61 y.o. male who was a recent discharge from the hospital with history of pneumonia.  He also had some CHF noted in the discharge summary.  Patient reports and his wife confirms that he has been having increasing shortness of breath and still coughing up some yellow phlegm.  Today he developed some pleuritic pain in the right side of the chest.  He had a fever of 101 at home.  Here his temperature is 99.1 pulse 98 respirations 24.  O2 sats 90%.  He is now on 2 L and O2 sats improved slightly.      Physical Exam   Triage Vital Signs: ED Triage Vitals  Enc Vitals Group     BP 02/06/22 1404 129/60     Pulse Rate 02/06/22 1404 98     Resp 02/06/22 1411 (!) 24     Temp 02/06/22 1404 99.1 F (37.3 C)     Temp Source 02/06/22 1404 Oral     SpO2 02/06/22 1404 90 %     Weight 02/06/22 1404 185 lb (83.9 kg)     Height 02/06/22 1404 '5\' 10"'$  (1.778 m)     Head Circumference --      Peak Flow --      Pain Score 02/06/22 1411 5     Pain Loc --      Pain Edu? --      Excl. in [place]? --     Most recent vital signs: Vitals:   02/06/22 2330 02/07/22 0000  BP: 128/75 121/67  Pulse: 89 92  Resp: 11 19  Temp:    SpO2: 99% 98%    General: Awake, alert breathing fast CV:  Good peripheral perfusion.  Regular rate and rhythm no audible murmurs there is some tenderness in the right side of the chest in the area of his pain when I palpate the area Resp:  Increased effort increased rate occasional rare scattered crackle Abd:  Soft minimal diffuse tenderness Extremities: No edema   ED Results / Procedures / Treatments   Labs (all labs ordered are listed, but only abnormal results are displayed) Labs Reviewed  CBC WITH DIFFERENTIAL/PLATELET - Abnormal; Notable for the following  components:      Result Value   WBC 10.6 (*)    Hemoglobin 12.3 (*)    HCT 37.0 (*)    RDW 15.9 (*)    Neutro Abs 8.5 (*)    All other components within normal limits  COMPREHENSIVE METABOLIC PANEL - Abnormal; Notable for the following components:   Sodium 133 (*)    Glucose, Bld 104 (*)    Creatinine, Ser 1.78 (*)    Alkaline Phosphatase 134 (*)    GFR, Estimated 43 (*)    All other components within normal limits  URINALYSIS, ROUTINE W REFLEX MICROSCOPIC - Abnormal; Notable for the following components:   Color, Urine COLORLESS (*)    APPearance CLEAR (*)    All other components within normal limits  SARS CORONAVIRUS 2 BY RT PCR  MRSA NEXT GEN BY PCR, NASAL  BRAIN NATRIURETIC PEPTIDE  LACTIC ACID, PLASMA  LACTIC ACID, PLASMA  PROCALCITONIN  D-DIMER, QUANTITATIVE  BASIC METABOLIC PANEL  CBC  LEGIONELLA PNEUMOPHILA SEROGP 1 UR AG  EKG  EKG read interpreted by me shows sinus tach rate of 105 normal axis no acute ST-T changes   RADIOLOGY Chest x-ray reviewed by me and interpreted by me shows no obvious marked infiltrate.  Maybe there is some slight fluffiness in the right middle lobe area I will await the radiologist report CT read by radiology reviewed by me shows groundglass opacities especially in dependent portions of the lungs consistent with pneumonia not significantly changed from the previous CT.  No sign of PE.  PROCEDURES:  Critical Care performed: 20 minutes.  This involved speaking to the patient reviewing his old records and current tests and speaking with the hospitalist.  Procedures   MEDICATIONS ORDERED IN ED: Medications  ALPRAZolam Duanne Moron) tablet 0.25 mg (0.25 mg Oral Given 02/06/22 2224)  docusate sodium (COLACE) capsule 100 mg (has no administration in time range)  pantoprazole (PROTONIX) EC tablet 40 mg (has no administration in time range)  polyethylene glycol (MIRALAX / GLYCOLAX) packet 17 g (has no administration in time range)   acetaminophen (TYLENOL) tablet 650 mg (has no administration in time range)    Or  acetaminophen (TYLENOL) suppository 650 mg (has no administration in time range)  ondansetron (ZOFRAN) tablet 4 mg (has no administration in time range)    Or  ondansetron (ZOFRAN) injection 4 mg (has no administration in time range)  enoxaparin (LOVENOX) injection 40 mg (40 mg Subcutaneous Given 02/06/22 2213)  ipratropium-albuterol (DUONEB) 0.5-2.5 (3) MG/3ML nebulizer solution 3 mL (has no administration in time range)  hydrALAZINE (APRESOLINE) tablet 10 mg (has no administration in time range)  morphine (PF) 2 MG/ML injection 2 mg (has no administration in time range)  fentaNYL (SUBLIMAZE) injection 25 mcg (has no administration in time range)  furosemide (LASIX) tablet 20 mg (has no administration in time range)  lisinopril (ZESTRIL) tablet 10 mg (has no administration in time range)  sertraline (ZOLOFT) tablet 150 mg (has no administration in time range)  gabapentin (NEURONTIN) capsule 600 mg (600 mg Oral Given 02/06/22 2211)  mometasone-formoterol (DULERA) 100-5 MCG/ACT inhaler 2 puff (has no administration in time range)  fluticasone (FLONASE) 50 MCG/ACT nasal spray 1 spray (has no administration in time range)  oxyCODONE (Oxy IR/ROXICODONE) immediate release tablet 5 mg (5 mg Oral Given 02/06/22 2225)  levofloxacin (LEVAQUIN) IVPB 750 mg (has no administration in time range)  iohexol (OMNIPAQUE) 350 MG/ML injection 60 mL (60 mLs Intravenous Contrast Given 02/06/22 1551)  diphenhydrAMINE (BENADRYL) injection 25 mg (25 mg Intravenous Given 02/06/22 1818)     IMPRESSION / MDM / ASSESSMENT AND PLAN / ED COURSE  I reviewed the triage vital signs and the nursing notes. ----------------------------------------- 3:40 PM on 02/06/2022 ----------------------------------------- Patient has been not very active for the last several weeks.  He is having pleuritic chest pain currently not a high-grade fever  relative tachycardia and tachypnea hypoxia with a fairly clear chest x-ray.  I will CT his chest again to make sure there is no PE.  I will help me see if there is any kind of pneumonia or groundglass appearing areas in the lungs.  In the meantime I will also get him some antibiotics.  He will be careful with the fluids with his history of CHF I ordered a BNP also to make sure there is no elevation there.  Differential diagnosis includes, but is not limited to, COVID is a possibility hospital associated pneumonia community-acquired pneumonia which is worsened from his previous admission or PE or possibly even CHF although this  is the most likely possibility  Patient's presentation is most consistent with acute presentation with potential threat to life or bodily function.  The patient is on the cardiac monitor to evaluate for evidence of arrhythmia and/or significant heart rate changes.  None have been seen      FINAL CLINICAL IMPRESSION(S) / ED DIAGNOSES   Final diagnoses:  Dyspnea, unspecified type  Hypoxia  HCAP (healthcare-associated pneumonia)     Rx / DC Orders   ED Discharge Orders     None        Note:  This document was prepared using Dragon voice recognition software and may include unintentional dictation errors.   Nena Polio, MD 02/07/22 [phone]

## 2022-02-06 NOTE — Assessment & Plan Note (Addendum)
Patient on lisinopril and Lasix

## 2022-02-06 NOTE — Assessment & Plan Note (Deleted)
-   Patient was discharged with azithromycin - Procalcitonin is elevated compared to prior on last admission - Levaquin 500 mg IV daily ordered - Check MRSA PCR - Check COVID PCR - Admit to MedSurg, observation

## 2022-02-06 NOTE — Assessment & Plan Note (Addendum)
Slight improvement of creatinine from 1.78 down to 1.59.

## 2022-02-06 NOTE — Assessment & Plan Note (Signed)
-   Oxycodone 5 mg p.o. every 6 hours as needed for moderate pain, 3 days ordered - This is a home medication

## 2022-02-06 NOTE — ED Triage Notes (Addendum)
Pt arrives with c/o SOB and fever that started today. Pt was recently d/c'd for PNA PO antibiotics Wednesday. Pt denies CP. Pt O2 sat 88-91% on RA. Pt placed on 2L [place].

## 2022-02-06 NOTE — Hospital Course (Signed)
[name] is a 61 year old male with history of hypertension, depression, anxiety, insomnia, GERD, who presents to the emergency department for chief concerns of shortness of breath and fever.  Initial vitals in the emergency department showed temperature of 99.1, respiration rate of 24, heart rate of 98, blood pressure 129/60, SPO2 of 90% on room air.  Serum sodium is 133, potassium 4.4, chloride 104, bicarb 22, BUN of 14, serum creatinine of 1.78, nonfasting blood glucose 104, WBC 10.6, hemoglobin 12.3, platelets of 170.  GFR of 43.  Lactic acid 1.3.  Procalcitonin was elevated at 0.52.  COVID PCR has been ordered and pending collection.  ED treatment: Levofloxacin 750 IV every 48 hours.  Patient still not feeling well.  In a lot of pain.  States he has no problems swallowing.  Family requested Dr. Lanney Gins pulmonary too see him. Feeling fatigued, coughing up yellow phlegm.  Had a fever of 101.  He does not wear oxygen at home.

## 2022-02-06 NOTE — ED Notes (Signed)
Pt reporting rash to right arm, MD notified

## 2022-02-06 NOTE — Assessment & Plan Note (Signed)
Pharmacy dosing of Levaquin 750 mg IV every 48 hours.  Check ANA and ANCA profile.

## 2022-02-06 NOTE — H&P (Addendum)
History and Physical   Henry Owens OHY:[id number] DOB: [birth year] DOA: 02/06/2022  PCP: Albina Billet, MD Patient coming from: Home  I have personally briefly reviewed patient's old medical records in [place].  Chief Concern: Shortness of breath  HPI: [name] is a 61 year old male with history of hypertension, depression, anxiety, insomnia, GERD, who presents to the emergency department for chief concerns of shortness of breath and fever.  Initial vitals in the emergency department showed temperature of 99.1, respiration rate of 24, heart rate of 98, blood pressure 129/60, SPO2 of 90% on room air.  Serum sodium is 133, potassium 4.4, chloride 104, bicarb 22, BUN of 14, serum creatinine of 1.78, nonfasting blood glucose 104, WBC 10.6, hemoglobin 12.3, platelets of 170.  GFR of 43.  Lactic acid 1.3.  Procalcitonin was elevated at 0.52.  COVID PCR has been ordered and pending collection.  ED treatment: Levofloxacin 500 mg IV  Patient reports worsening cough that is productive of yellow sputum and generalized muscle aches with distant low back pain.  He reports he takes chronic pain medication for back pain.  He endorses chills, fever with a high of 101.7 at home.  He denies known sick contacts.  He endorses poor p.o. intake  He denies dysuria, hematuria, diarrhea, chest pain, syncope, loss of consciousness, nausea, vomiting.  ROS: Constitutional: no weight change, no fever ENT/Mouth: no sore throat, no rhinorrhea Eyes: no eye pain, no vision changes Cardiovascular: no chest pain, + dyspnea,  no edema, no palpitations Respiratory: + cough, + sputum, no wheezing Gastrointestinal: no nausea, no vomiting, no diarrhea, no constipation Genitourinary: no urinary incontinence, no dysuria, no hematuria Musculoskeletal: no arthralgias, no myalgias Skin: no skin lesions, no pruritus, Neuro: + weakness, no loss of consciousness, no syncope Psych: no anxiety, no  depression, + decrease appetite Heme/Lymph: no bruising, no bleeding  ED Course: Discussed with emergency medicine provider, patient requiring hospitalization for chief concerns of pneumonia.  Assessment/Plan  Principal Problem:   Community acquired pneumonia Active Problems:   Chronic pain   Pneumonia   Essential hypertension   Hyponatremia   Stage 3a chronic kidney disease (CKD) ([place])   Assessment and Plan:  * Community acquired pneumonia - Patient was discharged with azithromycin - Procalcitonin is elevated compared to prior on last admission - Levaquin 500 mg IV daily ordered - Check MRSA PCR - Check COVID PCR - Admit to MedSurg, observation  Stage 3a chronic kidney disease (CKD) ([facility]) - Mild increase in serum creatinine - Check BMP in the  Essential hypertension - Patient takes lisinopril 10 mg daily, resumed - Hydralazine 10 mg p.o. every 8 hours as needed for SBP greater than 180, 4 days ordered  Pneumonia - Levofloxacin 500 mg IV daily, 7 days ordered  Chronic pain - Oxycodone 5 mg p.o. every 6 hours as needed for moderate pain, 3 days ordered - This is a home medication  Chart reviewed.   Hospitalization from 01/28/2022 to 01/30/2022 for bronchitis and bilateral lung infiltrate.  Patient was discharged with azithromycin and p.o. steroids.  DVT prophylaxis: Enoxaparin 40 mg subcutaneous Code Status: Full code Diet: Heart healthy Family Communication: Updated spouse at bedside with patient's permission Disposition Plan: Pending clinical course Consults called: None at this time Admission status: MedSurg, observation  Past Medical History:  Diagnosis Date   Anxiety    Arthritis    Flu 07/2017   Past Surgical History:  Procedure Laterality Date   APPENDECTOMY  1975  BACK SURGERY     KNEE ARTHROSCOPY WITH MEDIAL MENISECTOMY Right 08/20/2017   Procedure: KNEE ARTHROSCOPY WITH PARTIAL MEDIAL AND LATERAL MENISECTOMY,PARTIAL SYNOVECTOMY,CHONDROPLASTY,  LOOSE BODY REMOVAL;  Surgeon: Lovell Sheehan, MD;  Location: [facility] ORS;  Service: Orthopedics;  Laterality: Right;   [place], 2000   Social History:  reports that he has been smoking cigarettes. He has a 37.50 pack-year smoking history. He has never used smokeless tobacco. He reports that he does not currently use drugs. He reports that he does not drink alcohol.  Allergies  Allergen Reactions   Wound Dressing Adhesive Other (See Comments)    BLISTER   Azithromycin Rash   Penicillin G Rash    Has patient had a PCN reaction causing immediate rash, facial/tongue/throat swelling, SOB or lightheadedness with hypotension: Yes Has patient had a PCN reaction causing severe rash involving mucus membranes or skin necrosis: No Has patient had a PCN reaction that required hospitalization: No Has patient had a PCN reaction occurring within the last 10 years: No If all of the above answers are "NO", then may proceed with Cephalosporin use.    Family History  Problem Relation Age of Onset   Bladder Cancer Neg Hx    Prostate cancer Neg Hx    Hematuria Neg Hx    Family history: Family history reviewed and not pertinent.  Prior to Admission medications   Medication Sig Start Date End Date Taking? Authorizing Provider  albuterol (PROVENTIL) (2.5 MG/3ML) 0.083% nebulizer solution Take 2.5 mg by nebulization every 6 (six) hours as needed for wheezing or shortness of breath.    [provider]  ALPRAZolam Duanne Moron) 0.25 MG tablet Take 0.25 mg by mouth 3 (three) times daily as needed for anxiety or sleep.    [provider]  azithromycin (ZITHROMAX) 500 MG tablet Take 1 tablet (500 mg total) by mouth daily at 6 PM. 01/30/22   Fritzi Mandes, MD  budesonide-formoterol [facility]) 80-4.5 MCG/ACT inhaler Inhale 2 puffs into the lungs in the morning and at bedtime.    [provider]  docusate sodium (COLACE) 50 MG capsule Take 50 mg by mouth 2 (two) times daily.     [provider]  fluticasone (FLONASE) 50 MCG/ACT nasal spray Place 1 spray into both nostrils daily as needed for allergies or rhinitis.    [provider]  furosemide (LASIX) 20 MG tablet Take 1 tablet (20 mg total) by mouth daily. 01/30/22 01/30/23  Fritzi Mandes, MD  gabapentin (NEURONTIN) 300 MG capsule Take 600 mg by mouth 3 (three) times daily.    [provider]  lisinopril (ZESTRIL) 10 MG tablet Take 10 mg by mouth daily.    [provider]  naloxone [facility]) nasal spray 4 mg/0.1 mL As needed For opioid reversal (unresponsiveness) intranasal spray 10/12/21   Loletha Grayer, MD  pantoprazole (PROTONIX) 40 MG tablet Take 1 tablet (40 mg total) by mouth daily. 07/11/21   Enzo Bi, MD  polyethylene glycol (MIRALAX / GLYCOLAX) 17 g packet Take 17 g by mouth daily as needed for moderate constipation. 10/12/21   Loletha Grayer, MD  sertraline (ZOLOFT) 100 MG tablet Take 150 mg by mouth daily.    [provider]   Physical Exam: Vitals:   02/06/22 1846 02/06/22 1930 02/06/22 1935 02/06/22 2000  BP: 111/73 127/87  110/71  Pulse: 87 97  90  Resp: '12 16  15  '$ Temp:   98.5 F (36.9 C)   TempSrc:  Oral   SpO2: 99% 98%  97%  Weight:      Height:       Constitutional: appears older than chronological age, frail, NAD, calm, comfortable Eyes: PERRL, lids and conjunctivae normal ENMT: Mucous membranes are moist. Posterior pharynx clear of any exudate or lesions. Age-appropriate dentition. Hearing appropriate Neck: normal, supple, no masses, no thyromegaly Respiratory: clear to auscultation bilaterally, no wheezing, no crackles. Normal respiratory effort. No accessory muscle use.  Cardiovascular: Regular rate and rhythm, no murmurs / rubs / gallops. No extremity edema. 2+ pedal pulses. No carotid bruits.  Abdomen: no tenderness, no masses palpated, no hepatosplenomegaly. Bowel sounds positive.  Musculoskeletal: no clubbing / cyanosis. No joint deformity  upper and lower extremities. Good ROM, no contractures, no atrophy. Normal muscle tone.  Back pain that is chronic Skin: no rashes, lesions, ulcers. No induration Neurologic: Sensation intact. Strength 5/5 in all 4.  Psychiatric: Normal judgment and insight. Alert and oriented x 3. Normal mood.   EKG: independently reviewed, showing sinus tachycardia with rate of 105, QTc 504  Chest x-ray on Admission: I personally reviewed and I agree with radiologist reading as below.  CT Angio Chest PE W and/or Wo Contrast  Result Date: 02/06/2022 CLINICAL DATA:  Short of breath, low-grade fever. EXAM: CT ANGIOGRAPHY CHEST WITH CONTRAST TECHNIQUE: Multidetector CT imaging of the chest was performed using the standard protocol during bolus administration of intravenous contrast. Multiplanar CT image reconstructions and MIPs were obtained to evaluate the vascular anatomy. RADIATION DOSE REDUCTION: This exam was performed according to the departmental dose-optimization program which includes automated exposure control, adjustment of the mA and/or kV according to patient size and/or use of iterative reconstruction technique. CONTRAST:  81m OMNIPAQUE IOHEXOL 350 MG/ML SOLN COMPARISON:  CT examination dated January 28, 2022 FINDINGS: Cardiovascular: Satisfactory opacification of the pulmonary arteries to the segmental level. No evidence of pulmonary embolism. Normal heart size. No pericardial effusion. Mediastinum/Nodes: No enlarged mediastinal, hilar, or axillary lymph nodes. Thyroid gland, trachea, and esophagus demonstrate no significant findings. Lungs/Pleura: Moderate-to-severe emphysematous changes of bilateral lungs. Bilateral ground-glass opacities predominantly in the dependent portions of bilateral lower lobes suggesting pneumonia, not significantly changed. No large pleural effusion or pneumothorax. Stable 4 mm pulmonary nodule in the right upper lobe (series 6, image 72) Upper Abdomen: No acute abnormality.  Musculoskeletal: There is again increased density of the axial skeleton. No acute osseous abnormality. Review of the MIP images confirms the above findings. IMPRESSION: 1. No CT evidence of pulmonary embolism. 2. Bilateral ground-glass opacities predominantly in the dependent portions of bilateral lower lobes suggesting pneumonia, not significantly changed. 3. Moderate-to-severe emphysematous changes of bilateral lungs. 4. Stable 4 mm pulmonary nodule in the right upper lobe. 5. Diffuse sclerosis of the axial skeleton, correlate with history of myelofibrosis, hyperparathyroidism or other metabolic disorders. Emphysema (ICD10-J43.9). Electronically Signed   By: IKeane PoliceD.O.   On: 02/06/2022 16:23   DG Chest 2 View  Result Date: 02/06/2022 CLINICAL DATA:  Shortness of breath and fever beginning today. EXAM: CHEST - 2 VIEW COMPARISON:  01/28/2022 FINDINGS: Heart size is normal. Mild aortic atherosclerosis is noted. Bronchial thickening suggesting bronchitis. No consolidation, collapse or effusion. Upper lobe predominant emphysema. IMPRESSION: Bronchitis pattern. No consolidation or collapse. Upper lobe predominant emphysema. Electronically Signed   By: MNelson ChimesM.D.   On: 02/06/2022 15:30    Labs on Admission: I have personally reviewed following labs  CBC: Recent Labs  Lab 02/06/22 1414  WBC 10.6*  NEUTROABS 8.5*  HGB 12.3*  HCT 37.0*  MCV 85.8  PLT 144   Basic Metabolic Panel: Recent Labs  Lab 02/06/22 1414  NA 133*  K 4.4  CL 104  CO2 22  GLUCOSE 104*  BUN 14  CREATININE 1.78*  CALCIUM 9.2   GFR: Estimated Creatinine Clearance: 45.6 mL/min (A) (by C-G formula based on SCr of 1.78 mg/dL (H)).  Liver Function Tests: Recent Labs  Lab 02/06/22 1414  AST 21  ALT 23  ALKPHOS 134*  BILITOT 0.8  PROT 7.2  ALBUMIN 4.2   Urine analysis:    Component Value Date/Time   COLORURINE COLORLESS (A) 02/06/2022 1648   APPEARANCEUR CLEAR (A) 02/06/2022 1648   LABSPEC 1.008  02/06/2022 1648   PHURINE 5.0 02/06/2022 1648   GLUCOSEU NEGATIVE 02/06/2022 1648   HGBUR NEGATIVE 02/06/2022 1648   BILIRUBINUR NEGATIVE 02/06/2022 1648   [place] 02/06/2022 1648   PROTEINUR NEGATIVE 02/06/2022 1648   NITRITE NEGATIVE 02/06/2022 1648   LEUKOCYTESUR NEGATIVE 02/06/2022 1648   Dr. Tobie Poet [facility]  If 7PM-7AM, please contact overnight-coverage provider If 7AM-7PM, please contact day coverage provider [email or web address]  02/06/2022, 9:13 PM

## 2022-02-07 ENCOUNTER — Other Ambulatory Visit: Payer: Self-pay

## 2022-02-07 ENCOUNTER — Inpatient Hospital Stay: Payer: Medicaid Other

## 2022-02-07 DIAGNOSIS — Z880 Allergy status to penicillin: Secondary | ICD-10-CM | POA: Diagnosis not present

## 2022-02-07 DIAGNOSIS — J432 Centrilobular emphysema: Secondary | ICD-10-CM | POA: Diagnosis present

## 2022-02-07 DIAGNOSIS — Z1152 Encounter for screening for COVID-19: Secondary | ICD-10-CM | POA: Diagnosis not present

## 2022-02-07 DIAGNOSIS — I130 Hypertensive heart and chronic kidney disease with heart failure and stage 1 through stage 4 chronic kidney disease, or unspecified chronic kidney disease: Secondary | ICD-10-CM | POA: Diagnosis present

## 2022-02-07 DIAGNOSIS — F17210 Nicotine dependence, cigarettes, uncomplicated: Secondary | ICD-10-CM | POA: Diagnosis present

## 2022-02-07 DIAGNOSIS — D631 Anemia in chronic kidney disease: Secondary | ICD-10-CM | POA: Diagnosis present

## 2022-02-07 DIAGNOSIS — N2581 Secondary hyperparathyroidism of renal origin: Secondary | ICD-10-CM | POA: Diagnosis present

## 2022-02-07 DIAGNOSIS — F32A Depression, unspecified: Secondary | ICD-10-CM | POA: Diagnosis present

## 2022-02-07 DIAGNOSIS — R531 Weakness: Secondary | ICD-10-CM

## 2022-02-07 DIAGNOSIS — F419 Anxiety disorder, unspecified: Secondary | ICD-10-CM | POA: Diagnosis present

## 2022-02-07 DIAGNOSIS — E875 Hyperkalemia: Secondary | ICD-10-CM | POA: Diagnosis not present

## 2022-02-07 DIAGNOSIS — K219 Gastro-esophageal reflux disease without esophagitis: Secondary | ICD-10-CM | POA: Diagnosis present

## 2022-02-07 DIAGNOSIS — J440 Chronic obstructive pulmonary disease with acute lower respiratory infection: Secondary | ICD-10-CM | POA: Diagnosis present

## 2022-02-07 DIAGNOSIS — Z91048 Other nonmedicinal substance allergy status: Secondary | ICD-10-CM | POA: Diagnosis not present

## 2022-02-07 DIAGNOSIS — Z8701 Personal history of pneumonia (recurrent): Secondary | ICD-10-CM | POA: Diagnosis not present

## 2022-02-07 DIAGNOSIS — N1831 Chronic kidney disease, stage 3a: Secondary | ICD-10-CM | POA: Diagnosis present

## 2022-02-07 DIAGNOSIS — J441 Chronic obstructive pulmonary disease with (acute) exacerbation: Secondary | ICD-10-CM

## 2022-02-07 DIAGNOSIS — I5032 Chronic diastolic (congestive) heart failure: Secondary | ICD-10-CM | POA: Diagnosis present

## 2022-02-07 DIAGNOSIS — D696 Thrombocytopenia, unspecified: Secondary | ICD-10-CM | POA: Diagnosis present

## 2022-02-07 DIAGNOSIS — G8929 Other chronic pain: Secondary | ICD-10-CM | POA: Diagnosis present

## 2022-02-07 DIAGNOSIS — J189 Pneumonia, unspecified organism: Secondary | ICD-10-CM | POA: Diagnosis present

## 2022-02-07 DIAGNOSIS — Z79899 Other long term (current) drug therapy: Secondary | ICD-10-CM | POA: Diagnosis not present

## 2022-02-07 DIAGNOSIS — Z7951 Long term (current) use of inhaled steroids: Secondary | ICD-10-CM | POA: Diagnosis not present

## 2022-02-07 DIAGNOSIS — N179 Acute kidney failure, unspecified: Secondary | ICD-10-CM | POA: Diagnosis not present

## 2022-02-07 DIAGNOSIS — Z881 Allergy status to other antibiotic agents status: Secondary | ICD-10-CM | POA: Diagnosis not present

## 2022-02-07 DIAGNOSIS — E871 Hypo-osmolality and hyponatremia: Secondary | ICD-10-CM | POA: Diagnosis present

## 2022-02-07 LAB — CBC
HCT: 35.7 % — ABNORMAL LOW (ref 39.0–52.0)
Hemoglobin: 11.6 g/dL — ABNORMAL LOW (ref 13.0–17.0)
MCH: 29.1 pg (ref 26.0–34.0)
MCHC: 32.5 g/dL (ref 30.0–36.0)
MCV: 89.5 fL (ref 80.0–100.0)
Platelets: 144 10*3/uL — ABNORMAL LOW (ref 150–400)
RBC: 3.99 MIL/uL — ABNORMAL LOW (ref 4.22–5.81)
RDW: 16.1 % — ABNORMAL HIGH (ref 11.5–15.5)
WBC: 7.8 10*3/uL (ref 4.0–10.5)
nRBC: 0 % (ref 0.0–0.2)

## 2022-02-07 LAB — BASIC METABOLIC PANEL
Anion gap: 9 (ref 5–15)
BUN: 16 mg/dL (ref 6–20)
CO2: 27 mmol/L (ref 22–32)
Calcium: 9.3 mg/dL (ref 8.9–10.3)
Chloride: 103 mmol/L (ref 98–111)
Creatinine, Ser: 1.59 mg/dL — ABNORMAL HIGH (ref 0.61–1.24)
GFR, Estimated: 49 mL/min — ABNORMAL LOW (ref >60)
Glucose, Bld: 116 mg/dL — ABNORMAL HIGH (ref 70–99)
Potassium: 4.6 mmol/L (ref 3.5–5.1)
Sodium: 139 mmol/L (ref 135–145)

## 2022-02-07 LAB — URINE DRUG SCREEN, QUALITATIVE (ARMC ONLY)
Amphetamines, Ur Screen: NOT DETECTED
Barbiturates, Ur Screen: NOT DETECTED
Benzodiazepine, Ur Scrn: NOT DETECTED
Cannabinoid 50 Ng, Ur ~~LOC~~: NOT DETECTED
Cocaine Metabolite,Ur ~~LOC~~: NOT DETECTED
MDMA (Ecstasy)Ur Screen: NOT DETECTED
Methadone Scn, Ur: NOT DETECTED
Opiate, Ur Screen: POSITIVE — AB
Phencyclidine (PCP) Ur S: NOT DETECTED
Tricyclic, Ur Screen: NOT DETECTED

## 2022-02-07 LAB — EXPECTORATED SPUTUM ASSESSMENT W GRAM STAIN, RFLX TO RESP C

## 2022-02-07 MED ORDER — FUROSEMIDE 10 MG/ML IJ SOLN
40.0000 mg | Freq: Every day | INTRAMUSCULAR | Status: DC
  Administered 2022-02-07: 40 mg via INTRAVENOUS
  Filled 2022-02-07: qty 4

## 2022-02-07 MED ORDER — METHYLPREDNISOLONE SODIUM SUCC 40 MG IJ SOLR
40.0000 mg | Freq: Every day | INTRAMUSCULAR | Status: DC
  Administered 2022-02-07: 40 mg via INTRAVENOUS
  Filled 2022-02-07: qty 1

## 2022-02-07 NOTE — Evaluation (Signed)
Physical Therapy Evaluation Patient Details Name: Henry Owens MRN: [id number] DOB: 1960-08-04 Today's Date: 02/07/2022  History of Present Illness  Henry Owens is a 46yoM who comes to [facility] on 02/06/22 c SOB and fever. Pt was recently d/c'd for PNA PO antibiotics Wednesday. Pt denies CP. Pt O2 sat 88-91% on RA. Pt placed on 2L . Pt now admitted for CAP. Negative for COVID-19.  UA negative.  CT angio chest negative for pulmonary embolism, bilateral groundglass opacities suggesting pneumonia, moderate to severe emphysematous change.  Clinical Impression  Pt in ED gurney, wife at bedside. Pt agreeable to session despite 9/10 pain in multiple areas and continued dyspnea at rest. Pt able to perform basic mobility at his baseline level of perform and despite dyspnea at rest, he has no progression with basic mobility. Pt passes balance screening with reassuring results. Pt denies any subjective changes to his strength or steadiness or need for assistance. No desaturation seen in session, however did not attempt ween below 2L/min due to faulty O2 meter on wall. RN made aware of pt's mobility. Pt left up in chair at EOS, wife changing bed linen. Will keep on caseload as O2 needs are determined with exertion mobility at DC.      Recommendations for follow up therapy are one component of a multi-disciplinary discharge planning process, led by the attending physician.  Recommendations may be updated based on patient status, additional functional criteria and insurance authorization.  Follow Up Recommendations No PT follow up      Assistance Recommended at Discharge PRN  Patient can return home with the following  Help with stairs or ramp for entrance;Assist for transportation;Assistance with cooking/housework;Direct supervision/assist for medications management    Equipment Recommendations None recommended by PT  Recommendations for Other Services       Functional Status Assessment Patient has  had a recent decline in their functional status and demonstrates the ability to make significant improvements in function in a reasonable and predictable amount of time.     Precautions / Restrictions Precautions Precautions: None Restrictions Weight Bearing Restrictions: No      Mobility  Bed Mobility Overal bed mobility: Modified Independent Bed Mobility: Supine to Sit     Supine to sit: Modified independent (Device/Increase time)     General bed mobility comments: painful, grimacing, labored, baseline    Transfers Overall transfer level: Independent   Transfers: Sit to/from Stand Sit to Stand: Independent, From elevated surface           General transfer comment: elevated ED gurney    Ambulation/Gait Ambulation/Gait assistance:  (stepping in place only due to room setup limititations and no acute inidication for AMB at this time.)                Stairs            Wheelchair Mobility    Modified Rankin (Stroke Patients Only)       Balance Overall balance assessment: Independent (able to static stand with eyes closed, march in place eye open, take steps to recliner, no LOB)                                           Pertinent Vitals/Pain Pain Assessment Pain Assessment: 0-10 Pain Score: 9  Pain Location: neck, back, Rt flank Pain Descriptors / Indicators: Aching Pain Intervention(s): Limited activity within patient's tolerance, Monitored  during session, Premedicated before session    Home Living Family/patient expects to be discharged to:: Private residence Living Arrangements: Spouse/significant other;Children (son, DTR, wife; all home during day) Available Help at Discharge: Available PRN/intermittently Type of Home: House Home Access: Stairs to enter Entrance Stairs-Rails: None Entrance Stairs-Number of Steps: 1   Home Layout: One level Home Equipment: Cane - single point;Toilet riser;Rolling Walker (2 wheels)       Prior Function Prior Level of Function : Independent/Modified Independent             Mobility Comments: Mod indep for household mobilization; intermittent use of SPC as needed (due to chronic pain), does tend to use facility [facility] for longer-distances (typically going out of house only for appts) ADLs Comments: At baseline, pt requires MIN GUARD from wife for most ADLs/iADLs d/t decreased balance and activity tolerance. Requires physical assist for donning socks/shoes, but is able to don pants MIN GUARD     Hand Dominance        Extremity/Trunk Assessment   Upper Extremity Assessment Upper Extremity Assessment: Overall WFL for tasks assessed    Lower Extremity Assessment Lower Extremity Assessment: Overall WFL for tasks assessed       Communication      Cognition Arousal/Alertness: Awake/alert Behavior During Therapy: WFL for tasks assessed/performed Overall Cognitive Status: Within Functional Limits for tasks assessed                                          General Comments      Exercises     Assessment/Plan    PT Assessment Patient needs continued PT services  PT Problem List Cardiopulmonary status limiting activity;Decreased activity tolerance       PT Treatment Interventions Gait training;Stair training;Functional mobility training;Therapeutic activities;Therapeutic exercise;Patient/family education    PT Goals (Current goals can be found in the Care Plan section)  Acute Rehab PT Goals Patient Stated Goal: be rid of dyspnea PT Goal Formulation: With patient Time For Goal Achievement: 02/21/22 Potential to Achieve Goals: Good    Frequency Min 2X/week     Co-evaluation               AM-PAC PT "6 Clicks" Mobility  Outcome Measure Help needed turning from your back to your side while in a flat bed without using bedrails?: None Help needed moving from lying on your back to sitting on the side of a flat bed without using  bedrails?: None Help needed moving to and from a bed to a chair (including a wheelchair)?: None Help needed standing up from a chair using your arms (e.g., wheelchair or bedside chair)?: None Help needed to walk in hospital room?: None Help needed climbing 3-5 steps with a railing? : A Little 6 Click Score: 23    End of Session Equipment Utilized During Treatment: Oxygen Activity Tolerance: Patient tolerated treatment well;No increased pain Patient left: in chair;with family/visitor present;with call bell/phone within reach Nurse Communication: Mobility status PT Visit Diagnosis: Difficulty in walking, not elsewhere classified (R26.2)    Time: 6270-3500 PT Time Calculation (min) (ACUTE ONLY): 18 min   Charges:   PT Evaluation $PT Eval Low Complexity: 1 Low         3:34 PM, 02/07/22 Etta Grandchild, PT, DPT Physical Therapist - [facility]  [phone] ([place])    Sampson C 02/07/2022, 3:31 PM

## 2022-02-07 NOTE — Consult Note (Signed)
PULMONOLOGY         Date: 02/07/2022,   MRN# [id number] Henry Owens Jul 22, 1960     AdmissionWeight: 83.9 kg                 CurrentWeight: 83.9 kg  Referring provider: Dr Leslye Peer   CHIEF COMPLAINT:   Recurrent pneumonia with readmission   HISTORY OF PRESENT ILLNESS   This is a  61 year old male with history of anxiety disorder, chronic recurrent bronchitis, history of osteoarthritis and influenza who came in for shortness of breath. He is on room air during my evaluation.  Patient had been hospitalized few months ago with requirement for ICU admission endotracheal intubation with mechanical ventilation.  Was also able to evaluate patient in clinic with ongoing cough and subjective dyspnea but remained normoxic on room air.   He was prescribed Bactrim therapy on outpatient for 21 days however continues to have productive cough and chest discomfort. He came in due to ongoing subjective respiratory symptoms and reports pain. with findings suggestive of active lower respiratory tract infection is currently being treated empirically for community-acquired pneumonia.  Patient CT chest performed on arrival with PE protocol.  I reviewed the study independently.  There are no findings to suggest pneumonia and no consolidated infiltrate however there are bilateral interstitial opacities which are posterior basal predominant with a anterior-posterior gradient.  This is suggestive of positional effect with linterstitial edema.   There also very obvious centrilobular emphysematous changes which are worse at the apices and upper lobes bilaterally.  He had an echo done in June which shows borderline normal findings.  He had findings of renal impairment. During my evaluation he reports pain despite having morphine and additionally has oral narcotics ordered. He requests more pain medication although he does note seem to be in distress and is not having labored breathing. It is concerning  that he requests more narcotic.    PAST MEDICAL HISTORY   Past Medical History:  Diagnosis Date   Anxiety    Arthritis    Flu 07/2017     SURGICAL HISTORY   Past Surgical History:  Procedure Laterality Date   APPENDECTOMY  1975   BACK SURGERY     KNEE ARTHROSCOPY WITH MEDIAL MENISECTOMY Right 08/20/2017   Procedure: KNEE ARTHROSCOPY WITH PARTIAL MEDIAL AND LATERAL MENISECTOMY,PARTIAL SYNOVECTOMY,CHONDROPLASTY, LOOSE BODY REMOVAL;  Surgeon: Lovell Sheehan, MD;  Location: [facility];  Service: Orthopedics;  Laterality: Right;   [place], 2000     FAMILY HISTORY   Family History  Problem Relation Age of Onset   Bladder Cancer Neg Hx    Prostate cancer Neg Hx    Hematuria Neg Hx      SOCIAL HISTORY   Social History   Tobacco Use   Smoking status: Every Day    Packs/day: 1.50    Years: 25.00    Total pack years: 37.50    Types: Cigarettes   Smokeless tobacco: Never  Vaping Use   Vaping Use: Never used  Substance Use Topics   Alcohol use: No   Drug use: Not Currently     MEDICATIONS    Home Medication:    Current Medication:  Current Facility-Administered Medications:    acetaminophen (TYLENOL) tablet 650 mg, 650 mg, Oral, Q6H PRN **OR** acetaminophen (TYLENOL) suppository 650 mg, 650 mg, Rectal, Q6H PRN, Cox, Amy N, DO   ALPRAZolam (XANAX) tablet 0.25 mg, 0.25 mg, Oral, TID PRN, Cox, Amy  N, DO, 0.25 mg at 02/06/22 2224   docusate sodium (COLACE) capsule 100 mg, 100 mg, Oral, Daily, Cox, Amy N, DO, 100 mg at 02/07/22 0921   enoxaparin (LOVENOX) injection 40 mg, 40 mg, Subcutaneous, Q24H, Cox, Amy N, DO, 40 mg at 02/06/22 2213   fluticasone (FLONASE) 50 MCG/ACT nasal spray 1 spray, 1 spray, Each Nare, Daily PRN, Cox, Amy N, DO   furosemide (LASIX) tablet 20 mg, 20 mg, Oral, Daily, Cox, Amy N, DO, 20 mg at 02/07/22 0920   gabapentin (NEURONTIN) capsule 600 mg, 600 mg, Oral, TID, Cox, Amy N, DO, 600 mg at 02/07/22 0920   hydrALAZINE  (APRESOLINE) tablet 10 mg, 10 mg, Oral, Q8H PRN, Cox, Amy N, DO   ipratropium-albuterol (DUONEB) 0.5-2.5 (3) MG/3ML nebulizer solution 3 mL, 3 mL, Nebulization, Q6H PRN, Cox, Amy N, DO   [START ON 02/08/2022] levofloxacin (LEVAQUIN) IVPB 750 mg, 750 mg, Intravenous, Q48H, Dolan, Carissa E, RPH   lisinopril (ZESTRIL) tablet 10 mg, 10 mg, Oral, Daily, Cox, Amy N, DO, 10 mg at 02/07/22 1751   methylPREDNISolone sodium succinate (SOLU-MEDROL) 40 mg/mL injection 40 mg, 40 mg, Intravenous, Daily, Wieting, Richard, MD, 40 mg at 02/07/22 1156   mometasone-formoterol (DULERA) 100-5 MCG/ACT inhaler 2 puff, 2 puff, Inhalation, BID, Cox, Amy N, DO, 2 puff at 02/07/22 0919   morphine (PF) 2 MG/ML injection 2 mg, 2 mg, Intravenous, Q4H PRN, Cox, Amy N, DO, 2 mg at 02/07/22 0923   ondansetron (ZOFRAN) tablet 4 mg, 4 mg, Oral, Q6H PRN **OR** ondansetron (ZOFRAN) injection 4 mg, 4 mg, Intravenous, Q6H PRN, Cox, Amy N, DO   oxyCODONE (Oxy IR/ROXICODONE) immediate release tablet 5 mg, 5 mg, Oral, Q6H PRN, Cox, Amy N, DO, 5 mg at 02/07/22 1209   pantoprazole (PROTONIX) EC tablet 40 mg, 40 mg, Oral, Daily, Cox, Amy N, DO, 40 mg at 02/07/22 0258   polyethylene glycol (MIRALAX / GLYCOLAX) packet 17 g, 17 g, Oral, Daily PRN, Cox, Amy N, DO   sertraline (ZOLOFT) tablet 150 mg, 150 mg, Oral, Daily, Cox, Amy N, DO, 150 mg at 02/07/22 5277  Current Outpatient Medications:    acetaminophen (TYLENOL) 325 MG tablet, Take 650 mg by mouth every 6 (six) hours as needed for fever., Disp: , Rfl:    albuterol (PROVENTIL) (2.5 MG/3ML) 0.083% nebulizer solution, Take 2.5 mg by nebulization every 6 (six) hours as needed for wheezing or shortness of breath., Disp: , Rfl:    ALPRAZolam (XANAX) 0.25 MG tablet, Take 0.25 mg by mouth 3 (three) times daily as needed for anxiety or sleep., Disp: , Rfl:    budesonide-formoterol (SYMBICORT) 80-4.5 MCG/ACT inhaler, Inhale 2 puffs into the lungs in the morning and at bedtime., Disp: , Rfl:     fluticasone (FLONASE) 50 MCG/ACT nasal spray, Place 1 spray into both nostrils daily as needed for allergies or rhinitis., Disp: , Rfl:    furosemide (LASIX) 20 MG tablet, Take 1 tablet (20 mg total) by mouth daily., Disp: 30 tablet, Rfl: 0   gabapentin (NEURONTIN) 300 MG capsule, Take 600 mg by mouth 3 (three) times daily., Disp: , Rfl:    lisinopril (ZESTRIL) 10 MG tablet, Take 10 mg by mouth daily., Disp: , Rfl:    pantoprazole (PROTONIX) 40 MG tablet, Take 1 tablet (40 mg total) by mouth daily., Disp: 30 tablet, Rfl: 2   sertraline (ZOLOFT) 100 MG tablet, Take 150 mg by mouth daily., Disp: , Rfl:    azithromycin (ZITHROMAX) 500 MG tablet, Take  1 tablet (500 mg total) by mouth daily at 6 PM. (Patient not taking: Reported on 02/06/2022), Disp: 4 tablet, Rfl: 0   docusate sodium (COLACE) 50 MG capsule, Take 50 mg by mouth 2 (two) times daily., Disp: , Rfl:    naloxone (NARCAN) nasal spray 4 mg/0.1 mL, As needed For opioid reversal (unresponsiveness) intranasal spray, Disp: 2 each, Rfl: 0   polyethylene glycol (MIRALAX / GLYCOLAX) 17 g packet, Take 17 g by mouth daily as needed for moderate constipation., Disp: 14 each, Rfl: 0    ALLERGIES   Wound dressing adhesive, Azithromycin, and Penicillin g     REVIEW OF SYSTEMS    Review of Systems:  Gen:  Denies  fever, sweats, chills weigh loss  HEENT: Denies blurred vision, double vision, ear pain, eye pain, hearing loss, nose bleeds, sore throat Cardiac:  No dizziness, chest pain or heaviness, chest tightness,edema Resp:   reports dyspnea chronically  Gi: Denies swallowing difficulty, stomach pain, nausea or vomiting, diarrhea, constipation, bowel incontinence Gu:  Denies bladder incontinence, burning urine Ext:   Denies Joint pain, stiffness or swelling Skin: Denies  skin rash, easy bruising or bleeding or hives Endoc:  Denies polyuria, polydipsia , polyphagia or weight change Psych:   Denies depression, insomnia or hallucinations    Other:  All other systems negative   VS: BP 126/74   Pulse 89   Temp 98 F (36.7 C) (Oral)   Resp 12   Ht '5\' 10"'$  (1.778 m)   Wt 83.9 kg   SpO2 97%   BMI 26.54 kg/m      PHYSICAL EXAM    GENERAL:NAD, no fevers, chills, no weakness no fatigue HEAD: Normocephalic, atraumatic.  EYES: Pupils equal, round, reactive to light. Extraocular muscles intact. No scleral icterus.  MOUTH: Moist mucosal membrane. Dentition intact. No abscess noted.  EAR, NOSE, THROAT: Clear without exudates. No external lesions.  NECK: Supple. No thyromegaly. No nodules. No JVD.  PULMONARY: decreased breath sounds with mild rhonchi worse at bases bilaterally.  CARDIOVASCULAR: S1 and S2. Regular rate and rhythm. No murmurs, rubs, or gallops. No edema. Pedal pulses 2+ bilaterally.  GASTROINTESTINAL: Soft, nontender, nondistended. No masses. Positive bowel sounds. No hepatosplenomegaly.  MUSCULOSKELETAL: No swelling, clubbing, or edema. Range of motion full in all extremities.  NEUROLOGIC: Cranial nerves II through XII are intact. No gross focal neurological deficits. Sensation intact. Reflexes intact.  SKIN: No ulceration, lesions, rashes, or cyanosis. Skin warm and dry. Turgor intact.  PSYCHIATRIC: Mood, affect within normal limits. The patient is awake, alert and oriented x 3. Insight, judgment intact.       IMAGING          ASSESSMENT/PLAN   Dyspnea on exertion   - Patient has CT chest with no PE but does have antero-posterior gradient of interstitial infiltrates suggestive of possible underlying edema  - BNP is normal and renal US in normal   - patient requests more pain medication despite having morphine and PO narcotics orderered  and high dose gabapentin TID - which is certainly concerning  - He does not have elevation of WBC count.  -will dc steroids due to little evidence of acute COPD exacerbation and absence of findings to suggest severe pneumonia  - may continue lasix '20mg'$  po daily      Centrilobular emphysema - he should continue albuterol and finish generic COPD treatment with antibiotics with levofloxacin -encourage to quit smoking       I have Dcd morphine , I dont  have evidence of any cause for severe pain , however he still has OXYCODONE AND GABAPENTIN '600MG'$  TID ordered and he may use this PRN.    Patient has exhibited drug seeking behavior and I will order urine drug screen      Thank you for allowing me to participate in the care of this patient.   Patient/Family are satisfied with care plan and all questions have been answered.    Provider disclosure: Patient with at least one acute or chronic illness or injury that poses a threat to life or bodily function and is being managed actively during this encounter.  All of the below services have been performed independently by signing provider:  review of prior documentation from internal and or external health records.  Review of previous and current lab results.  Interview and comprehensive assessment during patient visit today. Review of current and previous chest radiographs/CT scans. Discussion of management and test interpretation with health care team and patient/family.   This document was prepared using Dragon voice recognition software and may include unintentional dictation errors.     Ottie Glazier, M.D.  Division of Pulmonary & Critical Care Medicine

## 2022-02-07 NOTE — Assessment & Plan Note (Signed)
Physical therapy evaluation °

## 2022-02-07 NOTE — Progress Notes (Addendum)
[facility]  CONSULT NOTE    Date: 02/07/2022                  Patient Name:  Henry Owens  MRN: [id number]  DOB: [birth year]  Age / Sex: 61 y.o., male         PCP: Albina Billet, MD                 Service Requesting Consult: [facility]                 Reason for Consult: Acute kidney injury            History of Present Illness: [name] is a 61 y.o.  male with past medical history including anxiety and depression, GERD, and hypertension, who was admitted to [facility] on 02/06/2022 for Community acquired pneumonia [J18.9] Pneumonia [J18.9]  Patient presents to the emergency room complaining of shortness of breath and fevers.  Patient was recently discharged for pneumonia and states he has completed antibiotic on Wednesday.  Patient states he continues to have shortness of breath and started having fevers yesterday.  Appetite has remained intact, denies nausea, vomiting, or diarrhea.  Denies chest pain.  Reports productive cough with yellow sputum.  Currently on 2 L nasal cannula, room air at baseline.  Has maintained all prescribed medications.  Labs on ED arrival include sodium 133, glucose 104, creatinine 1.78 with GFR 43 elevated WBCs 10.6 with hemoglobin 12.3.  Negative for COVID-19.  UA negative.  CT angio chest negative for pulmonary embolism, bilateral groundglass opacities suggesting pneumonia, moderate to severe emphysematous changes, and stable 4 mm pulmonary nodule on right upper lobe.  Chest x-ray shows upper lobe predominant emphysema.  Nephrology was consulted for elevated creatinine. Patient was seen in the ER. Patient main concern continues to be shortness of breath. Patient offers no complaint of hematuria No complaint of change in speech or change in vision Patient gives no history of over-the-counter NSAID use Patient did not give any history of nephrolithiasis. Patient has no family history of ESRD Patient has no family  history of early hearing loss   Medications: Outpatient medications: (Not in a hospital admission)   Current medications: Current Facility-Administered Medications  Medication Dose Route Frequency Provider Last Rate Last Admin   acetaminophen (TYLENOL) tablet 650 mg  650 mg Oral Q6H PRN Cox, Amy N, DO       Or   acetaminophen (TYLENOL) suppository 650 mg  650 mg Rectal Q6H PRN Cox, Amy N, DO       ALPRAZolam Duanne Moron) tablet 0.25 mg  0.25 mg Oral TID PRN Cox, Amy N, DO   0.25 mg at 02/06/22 2224   docusate sodium (COLACE) capsule 100 mg  100 mg Oral Daily Cox, Amy N, DO   100 mg at 02/07/22 0921   enoxaparin (LOVENOX) injection 40 mg  40 mg Subcutaneous Q24H Cox, Amy N, DO   40 mg at 02/06/22 2213   fluticasone (FLONASE) 50 MCG/ACT nasal spray 1 spray  1 spray Each Nare Daily PRN Cox, Amy N, DO       furosemide (LASIX) tablet 20 mg  20 mg Oral Daily Cox, Amy N, DO   20 mg at 02/07/22 0920   gabapentin (NEURONTIN) capsule 600 mg  600 mg Oral TID Cox, Amy N, DO   600 mg at 02/07/22 0920   hydrALAZINE (APRESOLINE) tablet 10 mg  10 mg Oral Q8H PRN Cox, Amy N,  DO       ipratropium-albuterol (DUONEB) 0.5-2.5 (3) MG/3ML nebulizer solution 3 mL  3 mL Nebulization Q6H PRN Cox, Amy N, DO       [START ON 02/08/2022] levofloxacin (LEVAQUIN) IVPB 750 mg  750 mg Intravenous Q48H Dolan, Carissa E, RPH       lisinopril (ZESTRIL) tablet 10 mg  10 mg Oral Daily Cox, Amy N, DO   10 mg at 02/07/22 0762   methylPREDNISolone sodium succinate (SOLU-MEDROL) 40 mg/mL injection 40 mg  40 mg Intravenous Daily Loletha Grayer, MD   40 mg at 02/07/22 1156   mometasone-formoterol (DULERA) 100-5 MCG/ACT inhaler 2 puff  2 puff Inhalation BID Cox, Amy N, DO   2 puff at 02/07/22 0919   morphine (PF) 2 MG/ML injection 2 mg  2 mg Intravenous Q4H PRN Cox, Amy N, DO   2 mg at 02/07/22 0923   ondansetron (ZOFRAN) tablet 4 mg  4 mg Oral Q6H PRN Cox, Amy N, DO       Or   ondansetron (ZOFRAN) injection 4 mg  4 mg Intravenous Q6H  PRN Cox, Amy N, DO       oxyCODONE (Oxy IR/ROXICODONE) immediate release tablet 5 mg  5 mg Oral Q6H PRN Cox, Amy N, DO   5 mg at 02/07/22 1209   pantoprazole (PROTONIX) EC tablet 40 mg  40 mg Oral Daily Cox, Amy N, DO   40 mg at 02/07/22 2633   polyethylene glycol (MIRALAX / GLYCOLAX) packet 17 g  17 g Oral Daily PRN Cox, Amy N, DO       sertraline (ZOLOFT) tablet 150 mg  150 mg Oral Daily Cox, Amy N, DO   150 mg at 02/07/22 3545   Current Outpatient Medications  Medication Sig Dispense Refill   acetaminophen (TYLENOL) 325 MG tablet Take 650 mg by mouth every 6 (six) hours as needed for fever.     albuterol (PROVENTIL) (2.5 MG/3ML) 0.083% nebulizer solution Take 2.5 mg by nebulization every 6 (six) hours as needed for wheezing or shortness of breath.     ALPRAZolam (XANAX) 0.25 MG tablet Take 0.25 mg by mouth 3 (three) times daily as needed for anxiety or sleep.     budesonide-formoterol (SYMBICORT) 80-4.5 MCG/ACT inhaler Inhale 2 puffs into the lungs in the morning and at bedtime.     fluticasone (FLONASE) 50 MCG/ACT nasal spray Place 1 spray into both nostrils daily as needed for allergies or rhinitis.     furosemide (LASIX) 20 MG tablet Take 1 tablet (20 mg total) by mouth daily. 30 tablet 0   gabapentin (NEURONTIN) 300 MG capsule Take 600 mg by mouth 3 (three) times daily.     lisinopril (ZESTRIL) 10 MG tablet Take 10 mg by mouth daily.     pantoprazole (PROTONIX) 40 MG tablet Take 1 tablet (40 mg total) by mouth daily. 30 tablet 2   sertraline (ZOLOFT) 100 MG tablet Take 150 mg by mouth daily.     azithromycin (ZITHROMAX) 500 MG tablet Take 1 tablet (500 mg total) by mouth daily at 6 PM. (Patient not taking: Reported on 02/06/2022) 4 tablet 0   docusate sodium (COLACE) 50 MG capsule Take 50 mg by mouth 2 (two) times daily.     naloxone (NARCAN) nasal spray 4 mg/0.1 mL As needed For opioid reversal (unresponsiveness) intranasal spray 2 each 0   polyethylene glycol (MIRALAX / GLYCOLAX) 17 g  packet Take 17 g by mouth daily as needed for moderate constipation.  14 each 0      Allergies: Allergies  Allergen Reactions   Wound Dressing Adhesive Other (See Comments)    BLISTER   Azithromycin Rash   Penicillin G Rash    Has patient had a PCN reaction causing immediate rash, facial/tongue/throat swelling, SOB or lightheadedness with hypotension: Yes Has patient had a PCN reaction causing severe rash involving mucus membranes or skin necrosis: No Has patient had a PCN reaction that required hospitalization: No Has patient had a PCN reaction occurring within the last 10 years: No If all of the above answers are "NO", then may proceed with Cephalosporin use.       Past Medical History: Past Medical History:  Diagnosis Date   Anxiety    Arthritis    Flu 07/2017     Past Surgical History: Past Surgical History:  Procedure Laterality Date   APPENDECTOMY  1975   BACK SURGERY     KNEE ARTHROSCOPY WITH MEDIAL MENISECTOMY Right 08/20/2017   Procedure: KNEE ARTHROSCOPY WITH PARTIAL MEDIAL AND LATERAL MENISECTOMY,PARTIAL SYNOVECTOMY,CHONDROPLASTY, LOOSE BODY REMOVAL;  Surgeon: Lovell Sheehan, MD;  Location: [facility] ORS;  Service: Orthopedics;  Laterality: Right;   [place], 2000     Family History: Family History  Problem Relation Age of Onset   Bladder Cancer Neg Hx    Prostate cancer Neg Hx    Hematuria Neg Hx      Social History: Social History   Socioeconomic History   Marital status: Married    Spouse name: Not on file   Number of children: Not on file   Years of education: Not on file   Highest education level: Not on file  Occupational History   Not on file  Tobacco Use   Smoking status: Every Day    Packs/day: 1.50    Years: 25.00    Total pack years: 37.50    Types: Cigarettes   Smokeless tobacco: Never  Vaping Use   Vaping Use: Never used  Substance and Sexual Activity   Alcohol use: No   Drug use: Not Currently   Sexual  activity: Not Currently    Birth control/protection: None  Other Topics Concern   Not on file  Social History Narrative   Not on file   Social Determinants of Health   Financial Resource Strain: Not on file  Food Insecurity: No Food Insecurity (01/29/2022)   Hunger Vital Sign    Worried About Running Out of Food in the Last Year: Never true    Ran Out of Food in the Last Year: Never true  Transportation Needs: No Transportation Needs (01/29/2022)   PRAPARE - Hydrologist (Medical): No    Lack of Transportation (Non-Medical): No  Physical Activity: Not on file  Stress: Not on file  Social Connections: Not on file  Intimate Partner Violence: Not At Risk (01/29/2022)   Humiliation, Afraid, Rape, and Kick questionnaire    Fear of Current or Ex-Partner: No    Emotionally Abused: No    Physically Abused: No    Sexually Abused: No     Review of Systems: Review of Systems  Constitutional:  Positive for fever. Negative for chills and malaise/fatigue.  HENT:  Negative for congestion, sore throat and tinnitus.   Eyes:  Negative for blurred vision and redness.  Respiratory:  Positive for shortness of breath. Negative for cough and wheezing.   Cardiovascular:  Negative for chest pain, palpitations, claudication and leg  swelling.  Gastrointestinal:  Negative for abdominal pain, blood in stool, diarrhea, nausea and vomiting.  Genitourinary:  Negative for flank pain, frequency and hematuria.  Musculoskeletal:  Negative for back pain, falls and myalgias.  Skin:  Negative for rash.  Neurological:  Negative for dizziness, weakness and headaches.  Endo/Heme/Allergies:  Does not bruise/bleed easily.  Psychiatric/Behavioral:  Negative for depression. The patient is not nervous/anxious and does not have insomnia.     Vital Signs: Blood pressure (!) 119/59, pulse 82, temperature 98 F (36.7 C), temperature source Oral, resp. rate 18, height '5\' 10"'$  (1.778 m), weight  83.9 kg, SpO2 97 %.  Weight trends: Filed Weights   02/06/22 1404  Weight: 83.9 kg    Physical Exam: General: NAD  Head: Normocephalic, atraumatic. Moist oral mucosal membranes  Eyes: Anicteric  Lungs:  Clear to auscultation normal effort, [place] O2  Heart: Regular rate and rhythm  Abdomen:  Soft, nontender  Extremities: No peripheral edema.  Neurologic: Nonfocal, moving all four extremities  Skin: No lesions  Access: None     Lab results: Basic Metabolic Panel: Recent Labs  Lab 02/06/22 1414 02/07/22 0455  NA 133* 139  K 4.4 4.6  CL 104 103  CO2 22 27  GLUCOSE 104* 116*  BUN 14 16  CREATININE 1.78* 1.59*  CALCIUM 9.2 9.3    Liver Function Tests: Recent Labs  Lab 02/06/22 1414  AST 21  ALT 23  ALKPHOS 134*  BILITOT 0.8  PROT 7.2  ALBUMIN 4.2   No results for input(s): "LIPASE", "AMYLASE" in the last 168 hours. No results for input(s): "AMMONIA" in the last 168 hours.  CBC: Recent Labs  Lab 02/06/22 1414 02/07/22 0455  WBC 10.6* 7.8  NEUTROABS 8.5*  --   HGB 12.3* 11.6*  HCT 37.0* 35.7*  MCV 85.8 89.5  PLT 170 144*    Cardiac Enzymes: No results for input(s): "CKTOTAL", "CKMB", "CKMBINDEX", "TROPONINI" in the last 168 hours.  BNP: Invalid input(s): "POCBNP"  CBG: No results for input(s): "GLUCAP" in the last 168 hours.  Microbiology: Results for orders placed or performed during the hospital encounter of 02/06/22  SARS Coronavirus 2 by RT PCR (hospital order, performed in [facility] hospital lab) *cepheid single result test* Anterior Nasal Swab     Status: None   Collection Time: 02/06/22  4:52 PM   Specimen: Anterior Nasal Swab  Result Value Ref Range Status   SARS Coronavirus 2 by RT PCR NEGATIVE NEGATIVE Final    Comment: (NOTE) SARS-CoV-2 target nucleic acids are NOT DETECTED.  The SARS-CoV-2 RNA is generally detectable in upper and lower respiratory specimens during the acute phase of infection. The lowest concentration of  SARS-CoV-2 viral copies this assay can detect is 250 copies / mL. A negative result does not preclude SARS-CoV-2 infection and should not be used as the sole basis for treatment or other patient management decisions.  A negative result may occur with improper specimen collection / handling, submission of specimen other than nasopharyngeal swab, presence of viral mutation(s) within the areas targeted by this assay, and inadequate number of viral copies (<250 copies / mL). A negative result must be combined with clinical observations, patient history, and epidemiological information.  Fact Sheet for Patients:   https://www.patel.info/  Fact Sheet for Healthcare Providers: https://hall.com/  This test is not yet approved or  cleared by the [place] FDA and has been authorized for detection and/or diagnosis of SARS-CoV-2 by FDA under an Emergency Use Authorization (EUA).  This EUA will remain in effect (meaning this test can be used) for the duration of the COVID-19 declaration under Section 564(b)(1) of the Act, 21 U.S.C. section 360bbb-3(b)(1), unless the authorization is terminated or revoked sooner.  Performed at [facility], [address] [place], [place] [address]   MRSA Next Gen by PCR, Nasal     Status: None   Collection Time: 02/06/22  7:32 PM   Specimen: Nasal Mucosa; Nasal Swab  Result Value Ref Range Status   MRSA by PCR Next Gen NOT DETECTED NOT DETECTED Final    Comment: (NOTE) The GeneXpert MRSA Assay (FDA approved for NASAL specimens only), is one component of a comprehensive MRSA colonization surveillance program. It is not intended to diagnose MRSA infection nor to guide or monitor treatment for MRSA infections. Test performance is not FDA approved in patients less than 73 years old. Performed at [facility], [address] [place], [place] [address]     Coagulation Studies: No results  for input(s): "LABPROT", "INR" in the last 72 hours.  Urinalysis: Recent Labs    02/06/22 1648  COLORURINE COLORLESS*  LABSPEC 1.008  PHURINE 5.0  GLUCOSEU NEGATIVE  HGBUR NEGATIVE  BILIRUBINUR NEGATIVE  KETONESUR NEGATIVE  PROTEINUR NEGATIVE  NITRITE NEGATIVE  LEUKOCYTESUR NEGATIVE      Imaging: CT Angio Chest PE W and/or Wo Contrast  Result Date: 02/06/2022 CLINICAL DATA:  Short of breath, low-grade fever. EXAM: CT ANGIOGRAPHY CHEST WITH CONTRAST TECHNIQUE: Multidetector CT imaging of the chest was performed using the standard protocol during bolus administration of intravenous contrast. Multiplanar CT image reconstructions and MIPs were obtained to evaluate the vascular anatomy. RADIATION DOSE REDUCTION: This exam was performed according to the departmental dose-optimization program which includes automated exposure control, adjustment of the mA and/or kV according to patient size and/or use of iterative reconstruction technique. CONTRAST:  78m OMNIPAQUE IOHEXOL 350 MG/ML SOLN COMPARISON:  CT examination dated January 28, 2022 FINDINGS: Cardiovascular: Satisfactory opacification of the pulmonary arteries to the segmental level. No evidence of pulmonary embolism. Normal heart size. No pericardial effusion. Mediastinum/Nodes: No enlarged mediastinal, hilar, or axillary lymph nodes. Thyroid gland, trachea, and esophagus demonstrate no significant findings. Lungs/Pleura: Moderate-to-severe emphysematous changes of bilateral lungs. Bilateral ground-glass opacities predominantly in the dependent portions of bilateral lower lobes suggesting pneumonia, not significantly changed. No large pleural effusion or pneumothorax. Stable 4 mm pulmonary nodule in the right upper lobe (series 6, image 72) Upper Abdomen: No acute abnormality. Musculoskeletal: There is again increased density of the axial skeleton. No acute osseous abnormality. Review of the MIP images confirms the above findings. IMPRESSION: 1.  No CT evidence of pulmonary embolism. 2. Bilateral ground-glass opacities predominantly in the dependent portions of bilateral lower lobes suggesting pneumonia, not significantly changed. 3. Moderate-to-severe emphysematous changes of bilateral lungs. 4. Stable 4 mm pulmonary nodule in the right upper lobe. 5. Diffuse sclerosis of the axial skeleton, correlate with history of myelofibrosis, hyperparathyroidism or other metabolic disorders. Emphysema (ICD10-J43.9). Electronically Signed   By: IKeane PoliceD.O.   On: 02/06/2022 16:23   DG Chest 2 View  Result Date: 02/06/2022 CLINICAL DATA:  Shortness of breath and fever beginning today. EXAM: CHEST - 2 VIEW COMPARISON:  01/28/2022 FINDINGS: Heart size is normal. Mild aortic atherosclerosis is noted. Bronchial thickening suggesting bronchitis. No consolidation, collapse or effusion. Upper lobe predominant emphysema. IMPRESSION: Bronchitis pattern. No consolidation or collapse. Upper lobe predominant emphysema. Electronically Signed   By: MNelson ChimesM.D.   On:  02/06/2022 15:30     Assessment & Plan: Mr. Deklan Minar is a 61 y.o.  male with with past medical history including anxiety and depression, GERD, and hypertension, who was admitted to [facility] on 02/06/2022 for Community acquired pneumonia [J18.9] Pneumonia [J18.9]  Acute kidney injury likely secondary to severe illness and diuretic use.   Normal function recorded on 11/12/2021, creatinine 1.01.   Will order renal ultrasound to evaluate obstruction.   IV contrast exposure on 02/06/2022 and previously on 01/28/2022.  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  Patient creatinine on October 7 was at 1.4-1.5. Patient received IV contrast on October 7 Patient creatinine was 1.6 on October 8 Patient did receive another IV contrast on October 16 Patient creatinine was at 1.8 on October 16 Patient creatinine is now at 1.6 Patient was on diuretics-Lasix and on ACE-lisinopril as  an outpatient.   Would recommend holding furosemide and lisinopril for now.  Avoid nephrotoxic agents and therapies.  We will continue to monitor.  Creatinine trend 2023  1.3==>1.8==>1.6 Patient had AKI in June when patient creatinine peaked at 2.4 Patient had AKI in March when patient creatinine peaked at 1.5    2)HTN    Blood pressure is stable    3)Anemia of chronic disease     Latest Ref Rng & Units 02/07/2022    4:55 AM 02/06/2022    2:14 PM 01/29/2022    5:44 AM  CBC  WBC 4.0 - 10.5 K/uL 7.8  10.6  7.3   Hemoglobin 13.0 - 17.0 g/dL 11.6  12.3  11.5   Hematocrit 39.0 - 52.0 % 35.7  37.0  36.7   Platelets 150 - 400 K/uL 144  170  119        HGb at goal (9--11)   4) Secondary hyperparathyroidism -CKD Mineral-Bone Disorder    Lab Results  Component Value Date   CALCIUM 9.3 02/07/2022   PHOS 3.0 10/12/2021      Phosphorus at goal.   5)Multifocal pneumonia Patient is being followed by primary team and pulmonary team  6) Electrolytes      Latest Ref Rng & Units 02/07/2022    4:55 AM 02/06/2022    2:14 PM 01/29/2022    5:44 AM  BMP  Glucose 70 - 99 mg/dL 116  104  101   BUN 6 - 20 mg/dL '16  14  16   '$ Creatinine 0.61 - 1.24 mg/dL 1.59  1.78  1.61   Sodium 135 - 145 mmol/L 139  133  139   Potassium 3.5 - 5.1 mmol/L 4.6  4.4  5.1   Chloride 98 - 111 mmol/L 103  104  105   CO2 22 - 32 mmol/L '27  22  26   '$ Calcium 8.9 - 10.3 mg/dL 9.3  9.2  9.5      Sodium Hyponatremia Patient had hyponatremia most likely secondary to lung issues Now better    Potassium Normokalemic    7)Acid base  Co2 at goal    Plan  We will ask for autoimmune work-up We will ask for renal ultrasound Agree with holding off lisinopril We will follow   I saw and evaluated the patient with Colon Flattery, NP.  I personally formulated the plan of care.  I agree with the findings and plan as documented in the note except as noted.     LOS: 0 Shantelle  Breeze 10/17/20231:41 PM

## 2022-02-07 NOTE — Assessment & Plan Note (Signed)
Check hepatitis C

## 2022-02-07 NOTE — Progress Notes (Addendum)
Progress Note   Patient: Henry Owens XBM:[id number] DOB: [birth year] DOA: 02/06/2022     0 DOS: the patient was seen and examined on 02/07/2022   Brief hospital course: Mr. Chino Sardo is a 61 year old male with history of hypertension, depression, anxiety, insomnia, GERD, who presents to the emergency department for chief concerns of shortness of breath and fever.  Initial vitals in the emergency department showed temperature of 99.1, respiration rate of 24, heart rate of 98, blood pressure 129/60, SPO2 of 90% on room air.  Serum sodium is 133, potassium 4.4, chloride 104, bicarb 22, BUN of 14, serum creatinine of 1.78, nonfasting blood glucose 104, WBC 10.6, hemoglobin 12.3, platelets of 170.  GFR of 43.  Lactic acid 1.3.  Procalcitonin was elevated at 0.52.  COVID PCR has been ordered and pending collection.  ED treatment: Levofloxacin 750 IV every 48 hours.  Patient still not feeling well.  In a lot of pain.  States he has no problems swallowing.  Family requested Dr. Lanney Gins pulmonary too see him. Feeling fatigued, coughing up yellow phlegm.  Had a fever of 101.  He does not wear oxygen at home.  Assessment and Plan: * Multifocal pneumonia Bilateral pneumonia seen on CT scan.  Pharmacy dosing Levaquin at 750 mg every 48 hours.  With pleuritic pain and Solu-Medrol.  Sputum culture.  COPD with acute exacerbation ([place]) With slight wheeze we will start Solu-Medrol.  Patient also has pleuritic chest pain on the right side.  Stage 3a chronic kidney disease (CKD) (HCC) Slight improvement of creatinine from 1.78 down to 1.59.  Essential hypertension Patient on lisinopril and Lasix  Hyponatremia Hyponatremia of 133 on admission and came up in the normal range today.  Thrombocytopenia (HCC) Check hepatitis C  Weakness Physical therapy evaluation  Chronic pain - Oxycodone 5 mg p.o. every 6 hours as needed for moderate pain, 3 days ordered         Subjective:  Patient had a fever of 101 at home.  He has been coughing up yellow phlegm.  He feels weak.  He does not wear oxygen at home.  Requested consultation with from pulmonary.  Was recently in the hospital for pneumonia  Physical Exam: Vitals:   02/07/22 1030 02/07/22 1130 02/07/22 1159 02/07/22 1200  BP: (!) 105/53 (!) 110/43  (!) 119/59  Pulse: 86 86  82  Resp: '19 19  18  '$ Temp:   98 F (36.7 C)   TempSrc:   Oral   SpO2: 95% 93%  97%  Weight:      Height:       Physical Exam HENT:     Head: Normocephalic.     Mouth/Throat:     Pharynx: No oropharyngeal exudate.  Eyes:     General: Lids are normal.     Conjunctiva/sclera: Conjunctivae normal.  Cardiovascular:     Rate and Rhythm: Normal rate and regular rhythm.     Heart sounds: Normal heart sounds, S1 normal and S2 normal.  Pulmonary:     Breath sounds: Examination of the right-lower field reveals decreased breath sounds and wheezing. Examination of the left-lower field reveals decreased breath sounds and wheezing. Decreased breath sounds and wheezing present. No rhonchi or rales.  Abdominal:     Palpations: Abdomen is soft.     Tenderness: There is no abdominal tenderness.  Musculoskeletal:     Right lower leg: No swelling.     Left lower leg: No swelling.  Skin:    General:  Skin is warm.     Findings: No rash.  Neurological:     Mental Status: He is alert and oriented to person, place, and time.     Comments: Able to straight leg raise     Data Reviewed: CT scan shows bilateral groundglass opacities suggestive of pneumonia, severe emphysematous changes of the lung, stable 4 mm pulmonary nodule right upper lobe, diffuse sclerosis of the axial skeleton. Creatinine 1.59, platelet count 144, hemoglobin 11.6  Family Communication: Spoke with wife at the bedside  Disposition: Status is: Inpatient Remains inpatient appropriate because: Being treated for pneumonia with IV antibiotics  Planned Discharge Destination:  Home    Time spent: 28 minutes  Author: Loletha Grayer, MD 02/07/2022 12:57 PM  For on call review [email or web address].

## 2022-02-07 NOTE — Assessment & Plan Note (Addendum)
Bilateral pneumonia seen on CT scan.  Pharmacy dosing Levaquin at 750 mg every 48 hours.  With pleuritic pain and Solu-Medrol.  Sputum culture.

## 2022-02-07 NOTE — Assessment & Plan Note (Signed)
With slight wheeze we will start Solu-Medrol.  Patient also has pleuritic chest pain on the right side.

## 2022-02-07 NOTE — Assessment & Plan Note (Signed)
Hyponatremia of 133 on admission and came up in the normal range today.

## 2022-02-08 DIAGNOSIS — J189 Pneumonia, unspecified organism: Secondary | ICD-10-CM | POA: Diagnosis not present

## 2022-02-08 LAB — CBC
HCT: 41.6 % (ref 39.0–52.0)
Hemoglobin: 13.6 g/dL (ref 13.0–17.0)
MCH: 28.7 pg (ref 26.0–34.0)
MCHC: 32.7 g/dL (ref 30.0–36.0)
MCV: 87.8 fL (ref 80.0–100.0)
Platelets: 193 10*3/uL (ref 150–400)
RBC: 4.74 MIL/uL (ref 4.22–5.81)
RDW: 16 % — ABNORMAL HIGH (ref 11.5–15.5)
WBC: 10 10*3/uL (ref 4.0–10.5)
nRBC: 0 % (ref 0.0–0.2)

## 2022-02-08 LAB — BASIC METABOLIC PANEL
Anion gap: 11 (ref 5–15)
BUN: 23 mg/dL — ABNORMAL HIGH (ref 6–20)
CO2: 28 mmol/L (ref 22–32)
Calcium: 10.1 mg/dL (ref 8.9–10.3)
Chloride: 100 mmol/L (ref 98–111)
Creatinine, Ser: 1.88 mg/dL — ABNORMAL HIGH (ref 0.61–1.24)
GFR, Estimated: 40 mL/min — ABNORMAL LOW (ref >60)
Glucose, Bld: 131 mg/dL — ABNORMAL HIGH (ref 70–99)
Potassium: 5.5 mmol/L — ABNORMAL HIGH (ref 3.5–5.1)
Sodium: 139 mmol/L (ref 135–145)

## 2022-02-08 MED ORDER — OXYCODONE HCL 5 MG PO TABS: 5.0000 mg | ORAL_TABLET | ORAL | Status: DC | PRN

## 2022-02-08 MED ORDER — SODIUM ZIRCONIUM CYCLOSILICATE 10 G PO PACK
10.0000 g | PACK | Freq: Once | ORAL | Status: AC
  Administered 2022-02-08: 10 g via ORAL
  Filled 2022-02-08: qty 1

## 2022-02-08 MED ORDER — SODIUM CHLORIDE 0.9 % IV SOLN
1.0000 g | INTRAVENOUS | Status: DC
  Administered 2022-02-08 – 2022-02-09 (×2): 1 g via INTRAVENOUS
  Filled 2022-02-08 (×2): qty 1

## 2022-02-08 MED ORDER — OXYCODONE HCL 5 MG PO TABS
5.0000 mg | ORAL_TABLET | ORAL | Status: DC | PRN
  Administered 2022-02-08 – 2022-02-09 (×4): 5 mg via ORAL
  Filled 2022-02-08 (×4): qty 1

## 2022-02-08 MED ORDER — DEXTROSE 5 % IV SOLN
0.5000 g | Freq: Three times a day (TID) | INTRAVENOUS | Status: DC
  Filled 2022-02-08: qty 5

## 2022-02-08 MED ORDER — SODIUM CHLORIDE 0.9 % IV SOLN: INTRAVENOUS | Status: AC

## 2022-02-08 NOTE — Progress Notes (Signed)
Henry Owens  MRN: [id number]  DOB/AGE: 1960-09-11 61 y.o.  Primary Care Physician:Tate, Leona Carry, MD  Admit date: 02/06/2022  Chief Complaint:  Chief Complaint  Patient presents with   Shortness of Breath    S-Pt presented on  02/06/2022 with  Chief Complaint  Patient presents with   Shortness of Breath  . [name] is a 61 y.o.  male with past medical history including anxiety and depression, GERD, and hypertension, who was admitted to [facility] on 02/06/2022 for Community acquired pneumonia [J18.9] Pneumonia [J18.9]   Nephrology was consulted for elevated creatinine. Patient was seen today on first floor Patient resting comfortably in the bed.    Medications  docusate sodium  100 mg Oral Daily   enoxaparin (LOVENOX) injection  40 mg Subcutaneous Q24H   gabapentin  600 mg Oral TID   mometasone-formoterol  2 puff Inhalation BID   pantoprazole  40 mg Oral Daily   sertraline  150 mg Oral Daily         HUT:MLYYT from the symptoms mentioned above,there are no other symptoms referable to all systems reviewed.  Physical Exam: Vital signs in last 24 hours: Temp:  [97.9 F (36.6 C)-98.2 F (36.8 C)] 98 F (36.7 C) (10/18 0404) Pulse Rate:  [67-105] 67 (10/18 0404) Resp:  [11-26] 18 (10/18 0404) BP: (97-148)/(42-98) 120/69 (10/18 0404) SpO2:  [91 %-99 %] 91 % (10/18 0404) Weight change:     Intake/Output from previous day: 10/17 0701 - 10/18 0700 In: -  Out: 300 [Urine:300] No intake/output data recorded.   Physical Exam:  General- pt is awake,alert, oriented to time place and person  Resp- No acute REsp distress, Decreased breath sounds at bases  CVS- S1S2 regular in rate and rhythm  GIT- BS+, soft, Non tender , Non distended  EXT- No LE Edema,  No Cyanosis   Lab Results:  CBC  Recent Labs    02/07/22 0455 02/08/22 0424  WBC 7.8 10.0  HGB 11.6* 13.6  HCT 35.7* 41.6  PLT 144* 193    BMET  Recent Labs     02/07/22 0455 02/08/22 0424  NA 139 139  K 4.6 5.5*  CL 103 100  CO2 27 28  GLUCOSE 116* 131*  BUN 16 23*  CREATININE 1.59* 1.88*  CALCIUM 9.3 10.1      Most recent Creatinine trend  Lab Results  Component Value Date   CREATININE 1.88 (H) 02/08/2022   CREATININE 1.59 (H) 02/07/2022   CREATININE 1.78 (H) 02/06/2022      MICRO   Recent Results (from the past 240 hour(s))  SARS Coronavirus 2 by RT PCR (hospital order, performed in [facility] hospital lab) *cepheid single result test* Anterior Nasal Swab     Status: None   Collection Time: 02/06/22  4:52 PM   Specimen: Anterior Nasal Swab  Result Value Ref Range Status   SARS Coronavirus 2 by RT PCR NEGATIVE NEGATIVE Final    Comment: (NOTE) SARS-CoV-2 target nucleic acids are NOT DETECTED.  The SARS-CoV-2 RNA is generally detectable in upper and lower respiratory specimens during the acute phase of infection. The lowest concentration of SARS-CoV-2 viral copies this assay can detect is 250 copies / mL. A negative result does not preclude SARS-CoV-2 infection and should not be used as the sole basis for treatment or other patient management decisions.  A negative result may occur with improper specimen collection / handling, submission of specimen other than nasopharyngeal swab, presence of viral  mutation(s) within the areas targeted by this assay, and inadequate number of viral copies (<250 copies / mL). A negative result must be combined with clinical observations, patient history, and epidemiological information.  Fact Sheet for Patients:   https://www.patel.info/  Fact Sheet for Healthcare Providers: https://hall.com/  This test is not yet approved or  cleared by the [place] FDA and has been authorized for detection and/or diagnosis of SARS-CoV-2 by FDA under an Emergency Use Authorization (EUA).  This EUA will remain in effect (meaning this test can be used) for  the duration of the COVID-19 declaration under Section 564(b)(1) of the Act, 21 U.S.C. section 360bbb-3(b)(1), unless the authorization is terminated or revoked sooner.  Performed at [facility], [address] [place], [place] [address]   MRSA Next Gen by PCR, Nasal     Status: None   Collection Time: 02/06/22  7:32 PM   Specimen: Nasal Mucosa; Nasal Swab  Result Value Ref Range Status   MRSA by PCR Next Gen NOT DETECTED NOT DETECTED Final    Comment: (NOTE) The GeneXpert MRSA Assay (FDA approved for NASAL specimens only), is one component of a comprehensive MRSA colonization surveillance program. It is not intended to diagnose MRSA infection nor to guide or monitor treatment for MRSA infections. Test performance is not FDA approved in patients less than 61 years old. Performed at [facility], [address] [place], [place] [address]   Expectorated Sputum Assessment w Gram Stain, Rflx to Resp Cult     Status: None   Collection Time: 02/07/22  4:39 PM   Specimen: Sputum  Result Value Ref Range Status   Specimen Description SPUTUM  Final   Special Requests SPUTUM  Final   Sputum evaluation   Final    THIS SPECIMEN IS ACCEPTABLE FOR SPUTUM CULTURE Performed at [facility], [address] [place], [place] [address]    Report Status 02/07/2022 FINAL  Final  Culture, Respiratory w Gram Stain     Status: None (Preliminary result)   Collection Time: 02/07/22  4:39 PM   Specimen: SPU  Result Value Ref Range Status   Specimen Description   Final    SPUTUM Performed at [facility], [address] [place], [place] [address]    Special Requests   Final    SPUTUM Reflexed from [id number] Performed at [facility], [address] [place], [place] [address]    Gram Stain   Final    FEW SQUAMOUS EPITHELIAL CELLS PRESENT FEW WBC PRESENT, PREDOMINANTLY MONONUCLEAR FEW BUDDING YEAST SEEN FEW GRAM POSITIVE COCCI IN PAIRS RARE GRAM  POSITIVE RODS Performed at [facility], [place] [address] [place], [place] [address]    Culture PENDING  Incomplete   Report Status PENDING  Incomplete         Impression:   [name] is a 61 y.o.  male with with past medical history including anxiety and depression, GERD, and hypertension, who was admitted to [facility] on 02/06/2022 for Community acquired pneumonia [J18.9] Pneumonia [J18.9]   Acute kidney injury likely secondary to severe illness and diuretic use.   Normal function recorded on 11/12/2021, creatinine 1.01.   Will order renal ultrasound to evaluate obstruction.   IV contrast exposure on 02/06/2022 and previously on 01/28/2022.  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  Patient creatinine on October 7 was at 1.4-1.5. Patient received IV contrast on October 7 Patient creatinine was 1.6 on October  8 Patient did receive another IV contrast on October 16 Patient creatinine was at 1.8 on October 16 Patient creatinine is now at 1.6 Patient was on diuretics-Lasix and on ACE-lisinopril as an outpatient.    Would recommend holding furosemide and lisinopril for now.  Avoid nephrotoxic agents and therapies.  We will continue to monitor.   Creatinine trend 2023  1.3==>1.6-->1.9 Patient had AKI in June when patient creatinine peaked at 2.4 Patient had AKI in March when patient creatinine peaked at 1.5    Patient AKI is worsening We will start patient on IV fluids We will follow     2HTN       Blood pressure is stable      3)Anemia of chronic disease/Thrombocytopenia     Latest Ref Rng & Units 02/08/2022    4:24 AM 02/07/2022    4:55 AM 02/06/2022    2:14 PM  CBC  WBC 4.0 - 10.5 K/uL 10.0  7.8  10.6   Hemoglobin 13.0 - 17.0 g/dL 13.6  11.6  12.3   Hematocrit 39.0 - 52.0 % 41.6  35.7  37.0   Platelets 150 - 400 K/uL 193  144  170        HGb at goal (9--11)  Platelet counts were low now better   4) Secondary  hyperparathyroidism -CKD Mineral-Bone Disorder    Lab Results  Component Value Date   CALCIUM 10.1 02/08/2022   PHOS 3.0 10/12/2021    Secondary Hyperparathyroidism w/u pending present /absent.  Phosphorus at goal.   5)Multifocal pneumonia Patient is being followed by primary team and pulmonary team   6) Electrolytes      Latest Ref Rng & Units 02/08/2022    4:24 AM 02/07/2022    4:55 AM 02/06/2022    2:14 PM  BMP  Glucose 70 - 99 mg/dL 131  116  104   BUN 6 - 20 mg/dL '23  16  14   '$ Creatinine 0.61 - 1.24 mg/dL 1.88  1.59  1.78   Sodium 135 - 145 mmol/L 139  139  133   Potassium 3.5 - 5.1 mmol/L 5.5  4.6  4.4   Chloride 98 - 111 mmol/L 100  103  104   CO2 22 - 32 mmol/L '28  27  22   '$ Calcium 8.9 - 10.3 mg/dL 10.1  9.3  9.2      Sodium Normonatremic   Potassium Hyperkalemia We will give Lokelma   7)Acid base    Co2 at goal     Plan:   Patient is hyperkalemic We will get [facility] Agree with stopping patient's diuretics We will give IV fluids At 50 mill per hour for 24 hours We will follow Chem-7 trend     Henry Owens 02/08/2022, 7:23 AM

## 2022-02-08 NOTE — Progress Notes (Signed)
PROGRESS NOTE    Henry Owens  ONG:[id number] DOB: 03-Dec-1960 DOA: 02/06/2022 PCP: Albina Billet, MD    Brief Narrative:  61 year old male with history of hypertension, depression, anxiety, insomnia, GERD, who presents to the emergency department for chief concerns of shortness of breath and fever.   Initial vitals in the emergency department showed temperature of 99.1, respiration rate of 24, heart rate of 98, blood pressure 129/60, SPO2 of 90% on room air.   Serum sodium is 133, potassium 4.4, chloride 104, bicarb 22, BUN of 14, serum creatinine of 1.78, nonfasting blood glucose 104, WBC 10.6, hemoglobin 12.3, platelets of 170.  GFR of 43.  Lactic acid 1.3.  Procalcitonin was elevated at 0.52.  Seen in consultation by nephrology for AKI and pulmonary.  Taken off steroids.  Remains on IV Levaquin.  Improving as of 10/18.   Assessment & Plan:   Principal Problem:   Multifocal pneumonia Active Problems:   COPD with acute exacerbation ([place])   Essential hypertension   Stage 3a chronic kidney disease (CKD) (HCC)   Hyponatremia   Chronic pain   Weakness   Thrombocytopenia (HCC)  * Multifocal pneumonia Bilateral pneumonia seen on CT scan.  Was on Levaquin.  Clinically improving.  Pleuritic pain.  Discontinue Solu-Medrol.  Multimodal pain regimen.  Antibiotics de-escalated to Ancef.  Possible transition to oral antibiotics at discharge 10/19.   COPD with acute exacerbation ([place]), ruled out No wheeze noted on examination.  Antibiotic stopped..   AKI on CKD stage IIIa Creatinine slightly worse today.  On gentle IV fluids.  Suspect prerenal azotemia.  Recheck creatinine in a.m.   Essential hypertension Patient on lisinopril and Lasix.  Currently on hold   Hyponatremia Now resolved   Thrombocytopenia ([place]) Panel negative   Weakness Physical therapy evaluation, no follow-up recommended.   Chronic pain Pain regimen.  Discontinued all IV narcotic.   DVT prophylaxis:  SQ Lovenox Code Status: Full Family Communication: Spouse at bedside 10/18 Disposition Plan: Status is: Inpatient Remains inpatient appropriate because: Multifocal pneumonia on IV antibiotics.  Clinically improving.  Possible discharge 10/19.   Level of care: Med-Surg  Consultants:  Pulmonary Nephrology none  Procedures:  None  Antimicrobials: Ancef   Subjective: Seen and examined.  Clinically improving.  No specific complaints today.  Objective: Vitals:   02/07/22 1830 02/07/22 2000 02/08/22 0404 02/08/22 0807  BP: 125/87 136/78 120/69 139/86  Pulse: 90 (!) 105 67 71  Resp: '12 17 18 16  '$ Temp:  97.9 F (36.6 C) 98 F (36.7 C) 98.2 F (36.8 C)  TempSrc:  Oral Oral   SpO2: 97% 95% 91% 97%  Weight:      Height:  '5\' 10"'$  (1.778 m)      Intake/Output Summary (Last 24 hours) at 02/08/2022 1104 Last data filed at 02/07/2022 2015 Gross per 24 hour  Intake --  Output 300 ml  Net -300 ml   Filed Weights   02/06/22 1404  Weight: 83.9 kg    Examination:  General exam: Appears calm and comfortable  Respiratory system: Mild bibasilar crackles.  Normal work of breathing.  0.5 L Cardiovascular system: S1-S2, RRR, no murmurs, no pedal edema Gastrointestinal system: Soft, NT/ND, normal bowel sounds Central nervous system: Alert and oriented. No focal neurological deficits. Extremities: Symmetric 5 x 5 power. Skin: No rashes, lesions or ulcers Psychiatry: Judgement and insight appear normal. Mood & affect appropriate.     Data Reviewed: I have personally reviewed following labs and imaging studies  CBC: Recent Labs  Lab 02/06/22 1414 02/07/22 0455 02/08/22 0424  WBC 10.6* 7.8 10.0  NEUTROABS 8.5*  --   --   HGB 12.3* 11.6* 13.6  HCT 37.0* 35.7* 41.6  MCV 85.8 89.5 87.8  PLT 170 144* 956   Basic Metabolic Panel: Recent Labs  Lab 02/06/22 1414 02/07/22 0455 02/08/22 0424  NA 133* 139 139  K 4.4 4.6 5.5*  CL 104 103 100  CO2 '22 27 28  '$ GLUCOSE 104*  116* 131*  BUN 14 16 23*  CREATININE 1.78* 1.59* 1.88*  CALCIUM 9.2 9.3 10.1   GFR: Estimated Creatinine Clearance: 43.1 mL/min (A) (by C-G formula based on SCr of 1.88 mg/dL (H)). Liver Function Tests: Recent Labs  Lab 02/06/22 1414  AST 21  ALT 23  ALKPHOS 134*  BILITOT 0.8  PROT 7.2  ALBUMIN 4.2   No results for input(s): "LIPASE", "AMYLASE" in the last 168 hours. No results for input(s): "AMMONIA" in the last 168 hours. Coagulation Profile: No results for input(s): "INR", "PROTIME" in the last 168 hours. Cardiac Enzymes: No results for input(s): "CKTOTAL", "CKMB", "CKMBINDEX", "TROPONINI" in the last 168 hours. BNP (last 3 results) No results for input(s): "PROBNP" in the last 8760 hours. HbA1C: No results for input(s): "HGBA1C" in the last 72 hours. CBG: No results for input(s): "GLUCAP" in the last 168 hours. Lipid Profile: No results for input(s): "CHOL", "HDL", "LDLCALC", "TRIG", "CHOLHDL", "LDLDIRECT" in the last 72 hours. Thyroid Function Tests: No results for input(s): "TSH", "T4TOTAL", "FREET4", "T3FREE", "THYROIDAB" in the last 72 hours. Anemia Panel: No results for input(s): "VITAMINB12", "FOLATE", "FERRITIN", "TIBC", "IRON", "RETICCTPCT" in the last 72 hours. Sepsis Labs: Recent Labs  Lab 02/06/22 1414 02/06/22 1652  PROCALCITON 0.52  --   LATICACIDVEN 1.3 1.6    Recent Results (from the past 240 hour(s))  SARS Coronavirus 2 by RT PCR (hospital order, performed in [facility] hospital lab) *cepheid single result test* Anterior Nasal Swab     Status: None   Collection Time: 02/06/22  4:52 PM   Specimen: Anterior Nasal Swab  Result Value Ref Range Status   SARS Coronavirus 2 by RT PCR NEGATIVE NEGATIVE Final    Comment: (NOTE) SARS-CoV-2 target nucleic acids are NOT DETECTED.  The SARS-CoV-2 RNA is generally detectable in upper and lower respiratory specimens during the acute phase of infection. The lowest concentration of SARS-CoV-2 viral copies  this assay can detect is 250 copies / mL. A negative result does not preclude SARS-CoV-2 infection and should not be used as the sole basis for treatment or other patient management decisions.  A negative result may occur with improper specimen collection / handling, submission of specimen other than nasopharyngeal swab, presence of viral mutation(s) within the areas targeted by this assay, and inadequate number of viral copies (<250 copies / mL). A negative result must be combined with clinical observations, patient history, and epidemiological information.  Fact Sheet for Patients:   https://www.patel.info/  Fact Sheet for Healthcare Providers: https://hall.com/  This test is not yet approved or  cleared by the [place] FDA and has been authorized for detection and/or diagnosis of SARS-CoV-2 by FDA under an Emergency Use Authorization (EUA).  This EUA will remain in effect (meaning this test can be used) for the duration of the COVID-19 declaration under Section 564(b)(1) of the Act, 21 U.S.C. section 360bbb-3(b)(1), unless the authorization is terminated or revoked sooner.  Performed at [facility], [address], [place], [place] [address]  MRSA Next Gen by PCR, Nasal     Status: None   Collection Time: 02/06/22  7:32 PM   Specimen: Nasal Mucosa; Nasal Swab  Result Value Ref Range Status   MRSA by PCR Next Gen NOT DETECTED NOT DETECTED Final    Comment: (NOTE) The GeneXpert MRSA Assay (FDA approved for NASAL specimens only), is one component of a comprehensive MRSA colonization surveillance program. It is not intended to diagnose MRSA infection nor to guide or monitor treatment for MRSA infections. Test performance is not FDA approved in patients less than 65 years old. Performed at [facility], [address] [place], [place] [address]   Expectorated Sputum Assessment w Gram Stain, Rflx to Resp Cult      Status: None   Collection Time: 02/07/22  4:39 PM   Specimen: Sputum  Result Value Ref Range Status   Specimen Description SPUTUM  Final   Special Requests SPUTUM  Final   Sputum evaluation   Final    THIS SPECIMEN IS ACCEPTABLE FOR SPUTUM CULTURE Performed at [facility], [address] [place], [place] [address]    Report Status 02/07/2022 FINAL  Final  Culture, Respiratory w Gram Stain     Status: None (Preliminary result)   Collection Time: 02/07/22  4:39 PM   Specimen: SPU  Result Value Ref Range Status   Specimen Description   Final    SPUTUM Performed at [facility], [address], [place], [place] [address]    Special Requests   Final    SPUTUM Reflexed from [id number] Performed at [facility], [address] [place], [place] [address]    Gram Stain   Final    FEW SQUAMOUS EPITHELIAL CELLS PRESENT FEW WBC PRESENT, PREDOMINANTLY MONONUCLEAR FEW BUDDING YEAST SEEN FEW GRAM POSITIVE COCCI IN PAIRS RARE GRAM POSITIVE RODS    Culture   Final    TOO YOUNG TO READ Performed at [facility], [place] [address] [place],  [address]    Report Status PENDING  Incomplete         Radiology Studies: US RENAL  Result Date: 02/07/2022 CLINICAL DATA:  AKI EXAM: RENAL / URINARY TRACT ULTRASOUND COMPLETE COMPARISON:  None Available. FINDINGS: Right Kidney: Renal measurements: 9.7 x 3.6 x 4.3 cm = volume: 79.4 mL. Echogenicity within normal limits. No mass or hydronephrosis visualized. Simple appearing cyst of the lower pole the right kidney measuring 2.1 x 1.9 x 1.8 cm. Left Kidney: Renal measurements: 10.4 x 4.3 x 5.3 cm = volume: 124.6 mL. Echogenicity within normal limits. No mass or hydronephrosis visualized. Bladder: Appears normal for degree of bladder distention. Other: None. IMPRESSION: No hydronephrosis. Electronically Signed   By: Yetta Glassman M.D.   On: 02/07/2022 15:40   CT Angio Chest PE W and/or Wo Contrast  Result  Date: 02/06/2022 CLINICAL DATA:  Short of breath, low-grade fever. EXAM: CT ANGIOGRAPHY CHEST WITH CONTRAST TECHNIQUE: Multidetector CT imaging of the chest was performed using the standard protocol during bolus administration of intravenous contrast. Multiplanar CT image reconstructions and MIPs were obtained to evaluate the vascular anatomy. RADIATION DOSE REDUCTION: This exam was performed according to the departmental dose-optimization program which includes automated exposure control, adjustment of the mA and/or kV according to patient size and/or use of iterative reconstruction technique. CONTRAST:  81m OMNIPAQUE IOHEXOL 350 MG/ML SOLN COMPARISON:  CT examination dated January 28, 2022 FINDINGS: Cardiovascular: Satisfactory opacification of the pulmonary arteries to the segmental level. No evidence  of pulmonary embolism. Normal heart size. No pericardial effusion. Mediastinum/Nodes: No enlarged mediastinal, hilar, or axillary lymph nodes. Thyroid gland, trachea, and esophagus demonstrate no significant findings. Lungs/Pleura: Moderate-to-severe emphysematous changes of bilateral lungs. Bilateral ground-glass opacities predominantly in the dependent portions of bilateral lower lobes suggesting pneumonia, not significantly changed. No large pleural effusion or pneumothorax. Stable 4 mm pulmonary nodule in the right upper lobe (series 6, image 72) Upper Abdomen: No acute abnormality. Musculoskeletal: There is again increased density of the axial skeleton. No acute osseous abnormality. Review of the MIP images confirms the above findings. IMPRESSION: 1. No CT evidence of pulmonary embolism. 2. Bilateral ground-glass opacities predominantly in the dependent portions of bilateral lower lobes suggesting pneumonia, not significantly changed. 3. Moderate-to-severe emphysematous changes of bilateral lungs. 4. Stable 4 mm pulmonary nodule in the right upper lobe. 5. Diffuse sclerosis of the axial skeleton, correlate  with history of myelofibrosis, hyperparathyroidism or other metabolic disorders. Emphysema (ICD10-J43.9). Electronically Signed   By: Keane Police D.O.   On: 02/06/2022 16:23   DG Chest 2 View  Result Date: 02/06/2022 CLINICAL DATA:  Shortness of breath and fever beginning today. EXAM: CHEST - 2 VIEW COMPARISON:  01/28/2022 FINDINGS: Heart size is normal. Mild aortic atherosclerosis is noted. Bronchial thickening suggesting bronchitis. No consolidation, collapse or effusion. Upper lobe predominant emphysema. IMPRESSION: Bronchitis pattern. No consolidation or collapse. Upper lobe predominant emphysema. Electronically Signed   By: Nelson Chimes M.D.   On: 02/06/2022 15:30        Scheduled Meds:  docusate sodium  100 mg Oral Daily   enoxaparin (LOVENOX) injection  40 mg Subcutaneous Q24H   gabapentin  600 mg Oral TID   mometasone-formoterol  2 puff Inhalation BID   pantoprazole  40 mg Oral Daily   sertraline  150 mg Oral Daily   Continuous Infusions:  sodium chloride 50 mL/hr at 02/08/22 0739   cefTRIAXone (ROCEPHIN)  IV       LOS: 1 day       Sidney Ace, MD [facility]   If 7PM-7AM, please contact night-coverage  02/08/2022, 11:04 AM

## 2022-02-09 DIAGNOSIS — J189 Pneumonia, unspecified organism: Secondary | ICD-10-CM | POA: Diagnosis not present

## 2022-02-09 LAB — CBC WITH DIFFERENTIAL/PLATELET
Abs Immature Granulocytes: 0.03 10*3/uL (ref 0.00–0.07)
Basophils Absolute: 0 10*3/uL (ref 0.0–0.1)
Basophils Relative: 0 %
Eosinophils Absolute: 0.1 10*3/uL (ref 0.0–0.5)
Eosinophils Relative: 2 %
HCT: 37 % — ABNORMAL LOW (ref 39.0–52.0)
Hemoglobin: 12 g/dL — ABNORMAL LOW (ref 13.0–17.0)
Immature Granulocytes: 1 %
Lymphocytes Relative: 32 %
Lymphs Abs: 2 10*3/uL (ref 0.7–4.0)
MCH: 28.3 pg (ref 26.0–34.0)
MCHC: 32.4 g/dL (ref 30.0–36.0)
MCV: 87.3 fL (ref 80.0–100.0)
Monocytes Absolute: 0.3 10*3/uL (ref 0.1–1.0)
Monocytes Relative: 5 %
Neutro Abs: 3.8 10*3/uL (ref 1.7–7.7)
Neutrophils Relative %: 60 %
Platelets: 161 10*3/uL (ref 150–400)
RBC: 4.24 MIL/uL (ref 4.22–5.81)
RDW: 16.3 % — ABNORMAL HIGH (ref 11.5–15.5)
WBC: 6.3 10*3/uL (ref 4.0–10.5)
nRBC: 0 % (ref 0.0–0.2)

## 2022-02-09 LAB — BASIC METABOLIC PANEL
Anion gap: 8 (ref 5–15)
BUN: 21 mg/dL — ABNORMAL HIGH (ref 6–20)
CO2: 25 mmol/L (ref 22–32)
Calcium: 9.2 mg/dL (ref 8.9–10.3)
Chloride: 106 mmol/L (ref 98–111)
Creatinine, Ser: 1.52 mg/dL — ABNORMAL HIGH (ref 0.61–1.24)
GFR, Estimated: 52 mL/min — ABNORMAL LOW (ref >60)
Glucose, Bld: 97 mg/dL (ref 70–99)
Potassium: 4.5 mmol/L (ref 3.5–5.1)
Sodium: 139 mmol/L (ref 135–145)

## 2022-02-09 LAB — ANCA PROFILE
Anti-MPO Antibodies: <0.2 units (ref 0.0–0.9)
Anti-PR3 Antibodies: <0.2 units (ref 0.0–0.9)
Atypical P-ANCA titer: <1:20 {titer}
C-ANCA: <1:20 {titer}
P-ANCA: <1:20 {titer}

## 2022-02-09 LAB — HEPATITIS C ANTIBODY: HCV Ab: NONREACTIVE — AB

## 2022-02-09 LAB — ANA W/REFLEX IF POSITIVE: Anti Nuclear Antibody (ANA): NEGATIVE

## 2022-02-09 MED ORDER — CEFDINIR 300 MG PO CAPS: 300.0000 mg | ORAL_CAPSULE | Freq: Two times a day (BID) | ORAL | 0 refills | Status: AC

## 2022-02-09 NOTE — Discharge Summary (Signed)
Physician Discharge Summary  Henry Owens SEG:[id number] DOB: Jul 19, 1960 DOA: 02/06/2022  PCP: Albina Billet, MD  Admit date: 02/06/2022 Discharge date: 02/09/2022  Admitted From: Home Disposition:  Home  Recommendations for Outpatient Follow-up:  Follow up with PCP in 1-2 weeks   Home Health:No Equipment/Devices:None   Discharge Condition:Stable  CODE STATUS:FULL  Diet recommendation: Reg  Brief/Interim Summary: 61 year old male with history of hypertension, depression, anxiety, insomnia, GERD, who presents to the emergency department for chief concerns of shortness of breath and fever.   Initial vitals in the emergency department showed temperature of 99.1, respiration rate of 24, heart rate of 98, blood pressure 129/60, SPO2 of 90% on room air.   Serum sodium is 133, potassium 4.4, chloride 104, bicarb 22, BUN of 14, serum creatinine of 1.78, nonfasting blood glucose 104, WBC 10.6, hemoglobin 12.3, platelets of 170.  GFR of 43.  Lactic acid 1.3.  Procalcitonin was elevated at 0.52.   Seen in consultation by nephrology for AKI and pulmonary.  Taken off steroids.  Remains on IV Levaquin.  Improving as of 10/18. Back to baseline respiratory status on day of discharge.  Kidney function improving.  Stable for DC at this time.  Will recommend cefdinir to complete 5-day antibiotic course for pneumonia.  Can resume home medications.  Follow-up outpatient PCP within 1 week for repeat lab work.  Ensure adequate hydration.    Discharge Diagnoses:  Principal Problem:   Multifocal pneumonia Active Problems:   COPD with acute exacerbation ([place])   Essential hypertension   Stage 3a chronic kidney disease (CKD) ([facility])   Hyponatremia   Chronic pain   Weakness   Thrombocytopenia ([facility])  * Multifocal pneumonia Bilateral pneumonia seen on CT scan.  Was on Levaquin.  Clinically improving.  Pleuritic pain.  Discontinue Solu-Medrol.  Multimodal pain regimen.  Antibiotics de-escalated to  Ancef.  DC on 10/19.  Convert to p.o. cefdinir.  Complete additional 3 days for 5-day antibiotic course.   COPD with acute exacerbation ([place]), ruled out No wheeze noted on examination.  Steroid stopped..   AKI on CKD stage IIIa Creatinine improving at time of discharge.  Not quite at baseline but approaching.  Can resume home medication regimen with outpatient PCP follow-up.  Ensure adequate hydration.   Essential hypertension Patient on lisinopril and Lasix.  Can resume on DC   Hyponatremia Now resolved   Thrombocytopenia ([place]) Panel negative   Weakness Physical therapy evaluation, no follow-up recommended.   Chronic pain PTA pain regimen  Discharge Instructions  Discharge Instructions     Diet - low sodium heart healthy   Complete by: As directed    Increase activity slowly   Complete by: As directed       Allergies as of 02/09/2022       Reactions   Wound Dressing Adhesive Other (See Comments)   BLISTER   Azithromycin Rash   Penicillin G Rash   Has patient had a PCN reaction causing immediate rash, facial/tongue/throat swelling, SOB or lightheadedness with hypotension: Yes Has patient had a PCN reaction causing severe rash involving mucus membranes or skin necrosis: No Has patient had a PCN reaction that required hospitalization: No Has patient had a PCN reaction occurring within the last 10 years: No If all of the above answers are "NO", then may proceed with Cephalosporin use.        Medication List     STOP taking these medications    azithromycin 500 MG tablet Commonly known as: VPXTGGYIR  TAKE these medications    acetaminophen 325 MG tablet Commonly known as: TYLENOL Take 650 mg by mouth every 6 (six) hours as needed for fever.   albuterol (2.5 MG/3ML) 0.083% nebulizer solution Commonly known as: PROVENTIL Take 2.5 mg by nebulization every 6 (six) hours as needed for wheezing or shortness of breath.   ALPRAZolam 0.25 MG  tablet Commonly known as: XANAX Take 0.25 mg by mouth 3 (three) times daily as needed for anxiety or sleep.   budesonide-formoterol 80-4.5 MCG/ACT inhaler Commonly known as: SYMBICORT Inhale 2 puffs into the lungs in the morning and at bedtime.   cefdinir 300 MG capsule Commonly known as: OMNICEF Take 1 capsule (300 mg total) by mouth 2 (two) times daily for 3 days. Start taking on: February 10, 2022   docusate sodium 50 MG capsule Commonly known as: COLACE Take 50 mg by mouth 2 (two) times daily.   fluticasone 50 MCG/ACT nasal spray Commonly known as: FLONASE Place 1 spray into both nostrils daily as needed for allergies or rhinitis.   furosemide 20 MG tablet Commonly known as: Lasix Take 1 tablet (20 mg total) by mouth daily.   gabapentin 300 MG capsule Commonly known as: NEURONTIN Take 600 mg by mouth 3 (three) times daily.   lisinopril 10 MG tablet Commonly known as: ZESTRIL Take 10 mg by mouth daily.   naloxone 4 MG/0.1ML Liqd nasal spray kit Commonly known as: NARCAN As needed For opioid reversal (unresponsiveness) intranasal spray   pantoprazole 40 MG tablet Commonly known as: Protonix Take 1 tablet (40 mg total) by mouth daily.   polyethylene glycol 17 g packet Commonly known as: MIRALAX / GLYCOLAX Take 17 g by mouth daily as needed for moderate constipation.   sertraline 100 MG tablet Commonly known as: ZOLOFT Take 150 mg by mouth daily.        Follow-up Information     Albina Billet, MD. Schedule an appointment as soon as possible for a visit in 1 week(s).   Specialty: Internal Medicine Contact information: [address] [place] [address] [phone]                Allergies  Allergen Reactions   Wound Dressing Adhesive Other (See Comments)    BLISTER   Azithromycin Rash   Penicillin G Rash    Has patient had a PCN reaction causing immediate rash, facial/tongue/throat swelling, SOB or lightheadedness with hypotension:  Yes Has patient had a PCN reaction causing severe rash involving mucus membranes or skin necrosis: No Has patient had a PCN reaction that required hospitalization: No Has patient had a PCN reaction occurring within the last 10 years: No If all of the above answers are "NO", then may proceed with Cephalosporin use.     Consultations: Nephrology Pulmonary   Procedures/Studies: US RENAL  Result Date: 02/07/2022 CLINICAL DATA:  AKI EXAM: RENAL / URINARY TRACT ULTRASOUND COMPLETE COMPARISON:  None Available. FINDINGS: Right Kidney: Renal measurements: 9.7 x 3.6 x 4.3 cm = volume: 79.4 mL. Echogenicity within normal limits. No mass or hydronephrosis visualized. Simple appearing cyst of the lower pole the right kidney measuring 2.1 x 1.9 x 1.8 cm. Left Kidney: Renal measurements: 10.4 x 4.3 x 5.3 cm = volume: 124.6 mL. Echogenicity within normal limits. No mass or hydronephrosis visualized. Bladder: Appears normal for degree of bladder distention. Other: None. IMPRESSION: No hydronephrosis. Electronically Signed   By: Yetta Glassman M.D.   On: 02/07/2022 15:40   CT  Angio Chest PE W and/or Wo Contrast  Result Date: 02/06/2022 CLINICAL DATA:  Short of breath, low-grade fever. EXAM: CT ANGIOGRAPHY CHEST WITH CONTRAST TECHNIQUE: Multidetector CT imaging of the chest was performed using the standard protocol during bolus administration of intravenous contrast. Multiplanar CT image reconstructions and MIPs were obtained to evaluate the vascular anatomy. RADIATION DOSE REDUCTION: This exam was performed according to the departmental dose-optimization program which includes automated exposure control, adjustment of the mA and/or kV according to patient size and/or use of iterative reconstruction technique. CONTRAST:  64m OMNIPAQUE IOHEXOL 350 MG/ML SOLN COMPARISON:  CT examination dated January 28, 2022 FINDINGS: Cardiovascular: Satisfactory opacification of the pulmonary arteries to the segmental level. No  evidence of pulmonary embolism. Normal heart size. No pericardial effusion. Mediastinum/Nodes: No enlarged mediastinal, hilar, or axillary lymph nodes. Thyroid gland, trachea, and esophagus demonstrate no significant findings. Lungs/Pleura: Moderate-to-severe emphysematous changes of bilateral lungs. Bilateral ground-glass opacities predominantly in the dependent portions of bilateral lower lobes suggesting pneumonia, not significantly changed. No large pleural effusion or pneumothorax. Stable 4 mm pulmonary nodule in the right upper lobe (series 6, image 72) Upper Abdomen: No acute abnormality. Musculoskeletal: There is again increased density of the axial skeleton. No acute osseous abnormality. Review of the MIP images confirms the above findings. IMPRESSION: 1. No CT evidence of pulmonary embolism. 2. Bilateral ground-glass opacities predominantly in the dependent portions of bilateral lower lobes suggesting pneumonia, not significantly changed. 3. Moderate-to-severe emphysematous changes of bilateral lungs. 4. Stable 4 mm pulmonary nodule in the right upper lobe. 5. Diffuse sclerosis of the axial skeleton, correlate with history of myelofibrosis, hyperparathyroidism or other metabolic disorders. Emphysema (ICD10-J43.9). Electronically Signed   By: IKeane PoliceD.O.   On: 02/06/2022 16:23   DG Chest 2 View  Result Date: 02/06/2022 CLINICAL DATA:  Shortness of breath and fever beginning today. EXAM: CHEST - 2 VIEW COMPARISON:  01/28/2022 FINDINGS: Heart size is normal. Mild aortic atherosclerosis is noted. Bronchial thickening suggesting bronchitis. No consolidation, collapse or effusion. Upper lobe predominant emphysema. IMPRESSION: Bronchitis pattern. No consolidation or collapse. Upper lobe predominant emphysema. Electronically Signed   By: MNelson ChimesM.D.   On: 02/06/2022 15:30   ECHOCARDIOGRAM COMPLETE  Result Date: 01/30/2022    ECHOCARDIOGRAM REPORT   Patient Name:   WTATEN MERROWDate of  Exam: 01/30/2022 Medical Rec #:  [id number]           Height:       70.0 in Accession #:    [id number]          Weight:       185.0 lb Date of Birth:  504/12/1960           BSA:          2.019 m Patient Age:    89years             BP:           154/89 mmHg Patient Gender: M                    HR:           70 bpm. Exam Location:  [facility] Procedure: 2D Echo, Color Doppler and Cardiac Doppler Indications:     I50.31 CHF Acute Diastolic  History:         Patient has prior history of Echocardiogram examinations, most  recent 10/10/2021. Risk Factors:Current Smoker.  Sonographer:     Rosalia Hammers Referring Phys:  [id number] Ottie Glazier Diagnosing Phys: Yolonda Kida MD  Sonographer Comments: Image acquisition challenging due to respiratory motion. IMPRESSIONS  1. Left ventricular ejection fraction, by estimation, is 55 to 60%. The left ventricle has normal function. The left ventricle has no regional wall motion abnormalities. Left ventricular diastolic parameters were normal.  2. Right ventricular systolic function is normal. The right ventricular size is normal.  3. The mitral valve is normal in structure. No evidence of mitral valve regurgitation.  4. The aortic valve is normal in structure. Aortic valve regurgitation is not visualized. FINDINGS  Left Ventricle: Left ventricular ejection fraction, by estimation, is 55 to 60%. The left ventricle has normal function. The left ventricle has no regional wall motion abnormalities. The left ventricular internal cavity size was normal in size. There is  no left ventricular hypertrophy. Left ventricular diastolic parameters were normal. Right Ventricle: The right ventricular size is normal. No increase in right ventricular wall thickness. Right ventricular systolic function is normal. Left Atrium: Left atrial size was normal in size. Right Atrium: Right atrial size was normal in size. Pericardium: There is no evidence of pericardial effusion. Mitral Valve:  The mitral valve is normal in structure. No evidence of mitral valve regurgitation. Tricuspid Valve: The tricuspid valve is normal in structure. Tricuspid valve regurgitation is trivial. Aortic Valve: The aortic valve is normal in structure. Aortic valve regurgitation is not visualized. Aortic valve mean gradient measures 4.0 mmHg. Aortic valve peak gradient measures 7.6 mmHg. Aortic valve area, by VTI measures 3.35 cm. Pulmonic Valve: The pulmonic valve was normal in structure. Pulmonic valve regurgitation is not visualized. Aorta: The ascending aorta was not well visualized. IAS/Shunts: No atrial level shunt detected by color flow Doppler.  LEFT VENTRICLE PLAX 2D LVIDd:         4.80 cm   Diastology LVIDs:         3.30 cm   LV e' medial:    11.40 cm/s LV PW:         0.80 cm   LV E/e' medial:  9.1 LV IVS:        1.00 cm   LV e' lateral:   12.50 cm/s LVOT diam:     2.20 cm   LV E/e' lateral: 8.3 LV SV:         87 LV SV Index:   43 LVOT Area:     3.80 cm  RIGHT VENTRICLE RV Basal diam:  3.60 cm RV Mid diam:    3.60 cm RV S prime:     17.50 cm/s TAPSE (M-mode): 3.3 cm LEFT ATRIUM             Index        RIGHT ATRIUM           Index LA diam:        3.00 cm 1.49 cm/m   RA Area:     17.50 cm LA Vol (A2C):   55.7 ml 27.58 ml/m  RA Volume:   48.60 ml  24.07 ml/m LA Vol (A4C):   25.1 ml 12.43 ml/m LA Biplane Vol: 40.2 ml 19.91 ml/m  AORTIC VALVE                    PULMONIC VALVE AV Area (Vmax):    3.47 cm     PR End Diast Vel: 2.78 msec AV Area (Vmean):  3.15 cm AV Area (VTI):     3.35 cm AV Vmax:           138.00 cm/s AV Vmean:          91.700 cm/s AV VTI:            0.260 m AV Peak Grad:      7.6 mmHg AV Mean Grad:      4.0 mmHg LVOT Vmax:         126.00 cm/s LVOT Vmean:        75.900 cm/s LVOT VTI:          0.229 m LVOT/AV VTI ratio: 0.88  AORTA Ao Root diam: 3.40 cm MITRAL VALVE MV Area (PHT): 3.89 cm     SHUNTS MV Decel Time: 195 msec     Systemic VTI:  0.23 m MV E velocity: 104.00 cm/s  Systemic Diam:  2.20 cm MV A velocity: 79.00 cm/s MV E/A ratio:  1.32 Yolonda Kida MD Electronically signed by Yolonda Kida MD Signature Date/Time: 01/30/2022/8:17:49 PM    Final    CT Angio Chest PE W and/or Wo Contrast  Result Date: 01/28/2022 CLINICAL DATA:  Short of breath, right rib pain, febrile, abdominal pain EXAM: CT ANGIOGRAPHY CHEST CT ABDOMEN AND PELVIS WITH CONTRAST TECHNIQUE: Multidetector CT imaging of the chest was performed using the standard protocol during bolus administration of intravenous contrast. Multiplanar CT image reconstructions and MIPs were obtained to evaluate the vascular anatomy. Multidetector CT imaging of the abdomen and pelvis was performed using the standard protocol during bolus administration of intravenous contrast. RADIATION DOSE REDUCTION: This exam was performed according to the departmental dose-optimization program which includes automated exposure control, adjustment of the mA and/or kV according to patient size and/or use of iterative reconstruction technique. CONTRAST:  137m OMNIPAQUE IOHEXOL 350 MG/ML SOLN COMPARISON:  01/28/2022, 11/10/2021, 07/05/2021 FINDINGS: CTA CHEST FINDINGS Cardiovascular: This is a technically adequate evaluation of the pulmonary vasculature. No filling defects or pulmonary emboli. The heart is unremarkable without pericardial effusion. No evidence of thoracic aortic aneurysm or dissection. Stable atherosclerosis. Mediastinum/Nodes: No enlarged mediastinal, hilar, or axillary lymph nodes. Thyroid gland, trachea, and esophagus demonstrate no significant findings. Lungs/Pleura: Severe emphysema unchanged. Patchy ground-glass airspace disease again identified within the mid to lower lung zones, greatest within the lingula and bilateral lower lobes. Areas of cavitation seen on the prior study are no longer evident. No effusion or pneumothorax. Central airways are patent. Stable 4 mm right upper lobe pulmonary nodule, reference image 58/3.  Musculoskeletal: Diffuse bony sclerosis is again identified, please correlate with any underlying history of hyperparathyroidism, myelofibrosis, or other metabolic abnormality. No acute or destructive bony lesion. Reconstructed images demonstrate no additional findings. Review of the MIP images confirms the above findings. CT ABDOMEN and PELVIS FINDINGS Hepatobiliary: No focal liver abnormality is seen. No gallstones, gallbladder wall thickening, or biliary dilatation. Pancreas: Unremarkable. No pancreatic ductal dilatation or surrounding inflammatory changes. Spleen: Spleen is enlarged measuring 15.8 cm in craniocaudal length. No focal parenchymal abnormality. Adrenals/Urinary Tract: 2 cm simple appearing cyst lower pole right kidney does not require follow-up. Otherwise the kidneys enhance normally and symmetrically. No urinary tract calculi or obstructive uropathy. The adrenals and bladder are unremarkable. Stomach/Bowel: No bowel obstruction or ileus. Moderate retained stool throughout the colon, greatest in the rectosigmoid colon, consistent with constipation. The appendix is not identified. No bowel wall thickening or inflammatory change. Vascular/Lymphatic: Aortic atherosclerosis. Retroaortic left renal vein incidentally noted, a frequent anatomic variant.  No pathologic adenopathy within the abdomen or pelvis. Reproductive: Prostate is unremarkable. Other: No free fluid or free intraperitoneal gas. No abdominal wall hernia. Musculoskeletal: Diffuse bony sclerosis again noted, please correlate with any history of underlying hyperparathyroidism, myelofibrosis, or metabolic abnormality. No acute or destructive bony lesion. Postsurgical changes from prior L4-S1 fusion. Reconstructed images demonstrate no additional findings. Review of the MIP images confirms the above findings. IMPRESSION: 1. No evidence of pulmonary embolus. 2. Bibasilar ground-glass airspace disease superimposed upon background emphysema, most  consistent with bibasilar pneumonia or aspiration. 3. Moderate retained stool throughout the colon consistent with constipation. No bowel obstruction or ileus. 4. Splenomegaly. 5. Aortic Atherosclerosis (ICD10-I70.0) and Emphysema (ICD10-J43.9). Electronically Signed   By: Randa Ngo M.D.   On: 01/28/2022 16:56   CT ABDOMEN PELVIS W CONTRAST  Result Date: 01/28/2022 CLINICAL DATA:  Short of breath, right rib pain, febrile, abdominal pain EXAM: CT ANGIOGRAPHY CHEST CT ABDOMEN AND PELVIS WITH CONTRAST TECHNIQUE: Multidetector CT imaging of the chest was performed using the standard protocol during bolus administration of intravenous contrast. Multiplanar CT image reconstructions and MIPs were obtained to evaluate the vascular anatomy. Multidetector CT imaging of the abdomen and pelvis was performed using the standard protocol during bolus administration of intravenous contrast. RADIATION DOSE REDUCTION: This exam was performed according to the departmental dose-optimization program which includes automated exposure control, adjustment of the mA and/or kV according to patient size and/or use of iterative reconstruction technique. CONTRAST:  130m OMNIPAQUE IOHEXOL 350 MG/ML SOLN COMPARISON:  01/28/2022, 11/10/2021, 07/05/2021 FINDINGS: CTA CHEST FINDINGS Cardiovascular: This is a technically adequate evaluation of the pulmonary vasculature. No filling defects or pulmonary emboli. The heart is unremarkable without pericardial effusion. No evidence of thoracic aortic aneurysm or dissection. Stable atherosclerosis. Mediastinum/Nodes: No enlarged mediastinal, hilar, or axillary lymph nodes. Thyroid gland, trachea, and esophagus demonstrate no significant findings. Lungs/Pleura: Severe emphysema unchanged. Patchy ground-glass airspace disease again identified within the mid to lower lung zones, greatest within the lingula and bilateral lower lobes. Areas of cavitation seen on the prior study are no longer evident.  No effusion or pneumothorax. Central airways are patent. Stable 4 mm right upper lobe pulmonary nodule, reference image 58/3. Musculoskeletal: Diffuse bony sclerosis is again identified, please correlate with any underlying history of hyperparathyroidism, myelofibrosis, or other metabolic abnormality. No acute or destructive bony lesion. Reconstructed images demonstrate no additional findings. Review of the MIP images confirms the above findings. CT ABDOMEN and PELVIS FINDINGS Hepatobiliary: No focal liver abnormality is seen. No gallstones, gallbladder wall thickening, or biliary dilatation. Pancreas: Unremarkable. No pancreatic ductal dilatation or surrounding inflammatory changes. Spleen: Spleen is enlarged measuring 15.8 cm in craniocaudal length. No focal parenchymal abnormality. Adrenals/Urinary Tract: 2 cm simple appearing cyst lower pole right kidney does not require follow-up. Otherwise the kidneys enhance normally and symmetrically. No urinary tract calculi or obstructive uropathy. The adrenals and bladder are unremarkable. Stomach/Bowel: No bowel obstruction or ileus. Moderate retained stool throughout the colon, greatest in the rectosigmoid colon, consistent with constipation. The appendix is not identified. No bowel wall thickening or inflammatory change. Vascular/Lymphatic: Aortic atherosclerosis. Retroaortic left renal vein incidentally noted, a frequent anatomic variant. No pathologic adenopathy within the abdomen or pelvis. Reproductive: Prostate is unremarkable. Other: No free fluid or free intraperitoneal gas. No abdominal wall hernia. Musculoskeletal: Diffuse bony sclerosis again noted, please correlate with any history of underlying hyperparathyroidism, myelofibrosis, or metabolic abnormality. No acute or destructive bony lesion. Postsurgical changes from prior L4-S1 fusion. Reconstructed images demonstrate no additional  findings. Review of the MIP images confirms the above findings. IMPRESSION:  1. No evidence of pulmonary embolus. 2. Bibasilar ground-glass airspace disease superimposed upon background emphysema, most consistent with bibasilar pneumonia or aspiration. 3. Moderate retained stool throughout the colon consistent with constipation. No bowel obstruction or ileus. 4. Splenomegaly. 5. Aortic Atherosclerosis (ICD10-I70.0) and Emphysema (ICD10-J43.9). Electronically Signed   By: Randa Ngo M.D.   On: 01/28/2022 16:56   DG Chest 2 View  Result Date: 01/28/2022 CLINICAL DATA:  Shortness of breath, right rib pain EXAM: CHEST - 2 VIEW COMPARISON:  11/10/2021 FINDINGS: Heart and mediastinal contours are within normal limits. No focal opacities or effusions. No acute bony abnormality. Diffuse sclerosis throughout the osseous structures compatible with renal osteodystrophy, unchanged. IMPRESSION: No active cardiopulmonary disease. Electronically Signed   By: Rolm Baptise M.D.   On: 01/28/2022 14:06      Subjective: Seen and examined on day of discharge.  Sitting comfortably in bed.  No cough, no wheeze.  No shortness of breath.  Stable for DC home.  Discharge Exam: Vitals:   02/09/22 0404 02/09/22 0846  BP: (!) 147/73 (!) 153/95  Pulse: 77 73  Resp: 17 18  Temp: (!) 97.5 F (36.4 C) 97.8 F (36.6 C)  SpO2: 100% 96%   Vitals:   02/08/22 1533 02/08/22 2120 02/09/22 0404 02/09/22 0846  BP: (!) 152/83 (!) 143/75 (!) 147/73 (!) 153/95  Pulse: 80 72 77 73  Resp: 17 17 17 18   Temp: 97.7 F (36.5 C) 97.9 F (36.6 C) (!) 97.5 F (36.4 C) 97.8 F (36.6 C)  TempSrc:    Oral  SpO2: 94% 100% 100% 96%  Weight:      Height:        General: Pt is alert, awake, not in acute distress Cardiovascular: RRR, S1/S2 +, no rubs, no gallops Respiratory: CTA bilaterally, no wheezing, no rhonchi Abdominal: Soft, NT, ND, bowel sounds + Extremities: no edema, no cyanosis    The results of significant diagnostics from this hospitalization (including imaging, microbiology, ancillary and  laboratory) are listed below for reference.     Microbiology: Recent Results (from the past 240 hour(s))  SARS Coronavirus 2 by RT PCR (hospital order, performed in [facility] hospital lab) *cepheid single result test* Anterior Nasal Swab     Status: None   Collection Time: 02/06/22  4:52 PM   Specimen: Anterior Nasal Swab  Result Value Ref Range Status   SARS Coronavirus 2 by RT PCR NEGATIVE NEGATIVE Final    Comment: (NOTE) SARS-CoV-2 target nucleic acids are NOT DETECTED.  The SARS-CoV-2 RNA is generally detectable in upper and lower respiratory specimens during the acute phase of infection. The lowest concentration of SARS-CoV-2 viral copies this assay can detect is 250 copies / mL. A negative result does not preclude SARS-CoV-2 infection and should not be used as the sole basis for treatment or other patient management decisions.  A negative result may occur with improper specimen collection / handling, submission of specimen other than nasopharyngeal swab, presence of viral mutation(s) within the areas targeted by this assay, and inadequate number of viral copies (<250 copies / mL). A negative result must be combined with clinical observations, patient history, and epidemiological information.  Fact Sheet for Patients:   https://www.patel.info/  Fact Sheet for Healthcare Providers: https://hall.com/  This test is not yet approved or  cleared by the [place] FDA and has been authorized for detection and/or diagnosis of SARS-CoV-2 by FDA under an Emergency  Use Authorization (EUA).  This EUA will remain in effect (meaning this test can be used) for the duration of the COVID-19 declaration under Section 564(b)(1) of the Act, 21 U.S.C. section 360bbb-3(b)(1), unless the authorization is terminated or revoked sooner.  Performed at [facility], [address] [place], [place] [address]   MRSA Next Gen by PCR, Nasal      Status: None   Collection Time: 02/06/22  7:32 PM   Specimen: Nasal Mucosa; Nasal Swab  Result Value Ref Range Status   MRSA by PCR Next Gen NOT DETECTED NOT DETECTED Final    Comment: (NOTE) The GeneXpert MRSA Assay (FDA approved for NASAL specimens only), is one component of a comprehensive MRSA colonization surveillance program. It is not intended to diagnose MRSA infection nor to guide or monitor treatment for MRSA infections. Test performance is not FDA approved in patients less than 14 years old. Performed at [facility], [address] [place], [place] [address]   Expectorated Sputum Assessment w Gram Stain, Rflx to Resp Cult     Status: None   Collection Time: 02/07/22  4:39 PM   Specimen: Sputum  Result Value Ref Range Status   Specimen Description SPUTUM  Final   Special Requests SPUTUM  Final   Sputum evaluation   Final    THIS SPECIMEN IS ACCEPTABLE FOR SPUTUM CULTURE Performed at [facility], [address] [place], [place] [address]    Report Status 02/07/2022 FINAL  Final  Culture, Respiratory w Gram Stain     Status: None (Preliminary result)   Collection Time: 02/07/22  4:39 PM   Specimen: SPU  Result Value Ref Range Status   Specimen Description   Final    SPUTUM Performed at [facility], [address] [place], [place] [address]    Special Requests   Final    SPUTUM Reflexed from [id number] Performed at [facility], [address] [place], [place] [address]    Gram Stain   Final    FEW SQUAMOUS EPITHELIAL CELLS PRESENT FEW WBC PRESENT, PREDOMINANTLY MONONUCLEAR FEW BUDDING YEAST SEEN FEW GRAM POSITIVE COCCI IN PAIRS RARE GRAM POSITIVE RODS    Culture   Final    TOO YOUNG TO READ Performed at [facility], [place] [address], [place],  [address]    Report Status PENDING  Incomplete     Labs: BNP (last 3 results) Recent Labs    10/09/21 1307 01/30/22 0948 02/06/22 1409  BNP 200.2* 65.2  95.2   Basic Metabolic Panel: Recent Labs  Lab 02/06/22 1414 02/07/22 0455 02/08/22 0424 02/09/22 0746  NA 133* 139 139 139  K 4.4 4.6 5.5* 4.5  CL 104 103 100 106  CO2 22 27 28 25   GLUCOSE 104* 116* 131* 97  BUN 14 16 23* 21*  CREATININE 1.78* 1.59* 1.88* 1.52*  CALCIUM 9.2 9.3 10.1 9.2   Liver Function Tests: Recent Labs  Lab 02/06/22 1414  AST 21  ALT 23  ALKPHOS 134*  BILITOT 0.8  PROT 7.2  ALBUMIN 4.2   No results for input(s): "LIPASE", "AMYLASE" in the last 168 hours. No results for input(s): "AMMONIA" in the last 168 hours. CBC: Recent Labs  Lab 02/06/22 1414 02/07/22 0455 02/08/22 0424 02/09/22 0746  WBC 10.6* 7.8 10.0 6.3  NEUTROABS 8.5*  --   --  3.8  HGB 12.3* 11.6* 13.6 12.0*  HCT 37.0* 35.7* 41.6 37.0*  MCV 85.8 89.5 87.8 87.3  PLT 170 144* 193 161   Cardiac Enzymes: No results for input(s): "CKTOTAL", "CKMB", "CKMBINDEX", "TROPONINI" in the last 168 hours. BNP: Invalid input(s): "POCBNP" CBG: No results for input(s): "GLUCAP" in the last 168 hours. D-Dimer Recent Labs    02/06/22 1414  DDIMER <0.27   Hgb A1c No results for input(s): "HGBA1C" in the last 72 hours. Lipid Profile No results for input(s): "CHOL", "HDL", "LDLCALC", "TRIG", "CHOLHDL", "LDLDIRECT" in the last 72 hours. Thyroid function studies No results for input(s): "TSH", "T4TOTAL", "T3FREE", "THYROIDAB" in the last 72 hours.  Invalid input(s): "FREET3" Anemia work up No results for input(s): "VITAMINB12", "FOLATE", "FERRITIN", "TIBC", "IRON", "RETICCTPCT" in the last 72 hours. Urinalysis    Component Value Date/Time   COLORURINE COLORLESS (A) 02/06/2022 1648   APPEARANCEUR CLEAR (A) 02/06/2022 1648   LABSPEC 1.008 02/06/2022 1648   PHURINE 5.0 02/06/2022 1648   GLUCOSEU NEGATIVE 02/06/2022 1648   HGBUR NEGATIVE 02/06/2022 1648   BILIRUBINUR NEGATIVE 02/06/2022 1648   KETONESUR NEGATIVE 02/06/2022 1648   PROTEINUR NEGATIVE 02/06/2022 1648   NITRITE NEGATIVE  02/06/2022 1648   LEUKOCYTESUR NEGATIVE 02/06/2022 1648   Sepsis Labs Recent Labs  Lab 02/06/22 1414 02/07/22 0455 02/08/22 0424 02/09/22 0746  WBC 10.6* 7.8 10.0 6.3   Microbiology Recent Results (from the past 240 hour(s))  SARS Coronavirus 2 by RT PCR (hospital order, performed in [place] hospital lab) *cepheid single result test* Anterior Nasal Swab     Status: None   Collection Time: 02/06/22  4:52 PM   Specimen: Anterior Nasal Swab  Result Value Ref Range Status   SARS Coronavirus 2 by RT PCR NEGATIVE NEGATIVE Final    Comment: (NOTE) SARS-CoV-2 target nucleic acids are NOT DETECTED.  The SARS-CoV-2 RNA is generally detectable in upper and lower respiratory specimens during the acute phase of infection. The lowest concentration of SARS-CoV-2 viral copies this assay can detect is 250 copies / mL. A negative result does not preclude SARS-CoV-2 infection and should not be used as the sole basis for treatment or other patient management decisions.  A negative result may occur with improper specimen collection / handling, submission of specimen other than nasopharyngeal swab, presence of viral mutation(s) within the areas targeted by this assay, and inadequate number of viral copies (<250 copies / mL). A negative result must be combined with clinical observations, patient history, and epidemiological information.  Fact Sheet for Patients:   https://www.patel.info/  Fact Sheet for Healthcare Providers: https://hall.com/  This test is not yet approved or  cleared by the [place] FDA and has been authorized for detection and/or diagnosis of SARS-CoV-2 by FDA under an Emergency Use Authorization (EUA).  This EUA will remain in effect (meaning this test can be used) for the duration of the COVID-19 declaration under Section 564(b)(1) of the Act, 21 U.S.C. section 360bbb-3(b)(1), unless the authorization is terminated  or revoked sooner.  Performed at [facility], [address] [place], [place] [address]   MRSA Next Gen by PCR, Nasal     Status: None   Collection Time: 02/06/22  7:32 PM   Specimen: Nasal Mucosa; Nasal Swab  Result Value Ref Range Status   MRSA by PCR Next Gen NOT DETECTED NOT DETECTED Final    Comment: (NOTE) The GeneXpert MRSA Assay (FDA approved for NASAL specimens only), is one component of a comprehensive MRSA colonization surveillance program. It is not intended to diagnose MRSA infection nor to guide or monitor treatment for MRSA infections. Test performance  is not FDA approved in patients less than 15 years old. Performed at [facility] [place], [address] [place], [place] [address]   Expectorated Sputum Assessment w Gram Stain, Rflx to Resp Cult     Status: None   Collection Time: 02/07/22  4:39 PM   Specimen: Sputum  Result Value Ref Range Status   Specimen Description SPUTUM  Final   Special Requests SPUTUM  Final   Sputum evaluation   Final    THIS SPECIMEN IS ACCEPTABLE FOR SPUTUM CULTURE Performed at [facility], [address], [place], [place] [address]    Report Status 02/07/2022 FINAL  Final  Culture, Respiratory w Gram Stain     Status: None (Preliminary result)   Collection Time: 02/07/22  4:39 PM   Specimen: SPU  Result Value Ref Range Status   Specimen Description   Final    SPUTUM Performed at [facility], [address], [place], [place] [address]    Special Requests   Final    SPUTUM Reflexed from [id number] Performed at [facility], [address] [place], [place] [address]    Gram Stain   Final    FEW SQUAMOUS EPITHELIAL CELLS PRESENT FEW WBC PRESENT, PREDOMINANTLY MONONUCLEAR FEW BUDDING YEAST SEEN FEW GRAM POSITIVE COCCI IN PAIRS RARE GRAM POSITIVE RODS    Culture   Final    TOO YOUNG TO READ Performed at [facility], [place] [address], [place],  [address]    Report Status  PENDING  Incomplete     Time coordinating discharge: Over 30 minutes  SIGNED:   Sidney Ace, MD  [facility] 02/09/2022, 10:37 AM Pager   If 7PM-7AM, please contact night-coverage

## 2022-02-09 NOTE — Progress Notes (Signed)
Ruvim Risko  MRN: [id number]  DOB/AGE: March 06, 1961 61 y.o.  Primary Care Physician:Tate, Leona Carry, MD  Admit date: 02/06/2022  Chief Complaint:  Chief Complaint  Patient presents with   Shortness of Breath    S-Pt presented on  02/06/2022 with  Chief Complaint  Patient presents with   Shortness of Breath  . Mr. Jaice Digioia is a 61 y.o.  male with past medical history including anxiety and depression, GERD, and hypertension, who was admitted to [facility] on 02/06/2022 for Community acquired pneumonia [J18.9] Pneumonia [J18.9]   Nephrology was consulted for elevated creatinine. Patient was seen today on first floor Patient resting comfortably in the bed. Patient wife was present in the room   Patient inpatient medication reviewed  OHY:WVPXT from the symptoms mentioned above,there are no other symptoms referable to all systems reviewed.  Physical Exam: Vital signs in last 24 hours: Temp:  [97.5 F (36.4 C)-97.9 F (36.6 C)] 97.8 F (36.6 C) (10/19 0846) Pulse Rate:  [72-77] 73 (10/19 0846) Resp:  [17-18] 18 (10/19 0846) BP: (143-153)/(73-95) 153/95 (10/19 0846) SpO2:  [96 %-100 %] 96 % (10/19 0846) Weight change:  Last BM Date : 02/08/22  Intake/Output from previous day: 10/18 0701 - 10/19 0700 In: 439.4 [I.V.:339.4; IV Piggyback:100] Out: 1250 [Urine:1250] Total I/O In: 993.9 [P.O.:120; I.V.:873.9] Out: 700 [Urine:700]   Physical Exam:  General- pt is awake,alert, oriented to time place and person  Resp- No acute REsp distress, Decreased breath sounds at bases  CVS- S1S2 regular in rate and rhythm  GIT- BS+, soft, Non tender , Non distended  EXT- No LE Edema,  No Cyanosis   Lab Results:  CBC  Recent Labs    02/08/22 0424 02/09/22 0746  WBC 10.0 6.3  HGB 13.6 12.0*  HCT 41.6 37.0*  PLT 193 161    BMET  Recent Labs    02/08/22 0424 02/09/22 0746  NA 139 139  K 5.5* 4.5  CL 100 106  CO2 28 25  GLUCOSE 131* 97  BUN 23*  21*  CREATININE 1.88* 1.52*  CALCIUM 10.1 9.2      Most recent Creatinine trend  Lab Results  Component Value Date   CREATININE 1.52 (H) 02/09/2022   CREATININE 1.88 (H) 02/08/2022   CREATININE 1.59 (H) 02/07/2022      MICRO   Recent Results (from the past 240 hour(s))  SARS Coronavirus 2 by RT PCR (hospital order, performed in [facility] hospital lab) *cepheid single result test* Anterior Nasal Swab     Status: None   Collection Time: 02/06/22  4:52 PM   Specimen: Anterior Nasal Swab  Result Value Ref Range Status   SARS Coronavirus 2 by RT PCR NEGATIVE NEGATIVE Final    Comment: (NOTE) SARS-CoV-2 target nucleic acids are NOT DETECTED.  The SARS-CoV-2 RNA is generally detectable in upper and lower respiratory specimens during the acute phase of infection. The lowest concentration of SARS-CoV-2 viral copies this assay can detect is 250 copies / mL. A negative result does not preclude SARS-CoV-2 infection and should not be used as the sole basis for treatment or other patient management decisions.  A negative result may occur with improper specimen collection / handling, submission of specimen other than nasopharyngeal swab, presence of viral mutation(s) within the areas targeted by this assay, and inadequate number of viral copies (<250 copies / mL). A negative result must be combined with clinical observations, patient history, and epidemiological information.  Fact Sheet for Patients:  https://www.patel.info/  Fact Sheet for Healthcare Providers: https://hall.com/  This test is not yet approved or  cleared by the [place] FDA and has been authorized for detection and/or diagnosis of SARS-CoV-2 by FDA under an Emergency Use Authorization (EUA).  This EUA will remain in effect (meaning this test can be used) for the duration of the COVID-19 declaration under Section 564(b)(1) of the Act, 21 U.S.C. section  360bbb-3(b)(1), unless the authorization is terminated or revoked sooner.  Performed at [facility], [address] [place], [place] [address]   MRSA Next Gen by PCR, Nasal     Status: None   Collection Time: 02/06/22  7:32 PM   Specimen: Nasal Mucosa; Nasal Swab  Result Value Ref Range Status   MRSA by PCR Next Gen NOT DETECTED NOT DETECTED Final    Comment: (NOTE) The GeneXpert MRSA Assay (FDA approved for NASAL specimens only), is one component of a comprehensive MRSA colonization surveillance program. It is not intended to diagnose MRSA infection nor to guide or monitor treatment for MRSA infections. Test performance is not FDA approved in patients less than 31 years old. Performed at [facility], [address] [place], [place] [address]   Expectorated Sputum Assessment w Gram Stain, Rflx to Resp Cult     Status: None   Collection Time: 02/07/22  4:39 PM   Specimen: Sputum  Result Value Ref Range Status   Specimen Description SPUTUM  Final   Special Requests SPUTUM  Final   Sputum evaluation   Final    THIS SPECIMEN IS ACCEPTABLE FOR SPUTUM CULTURE Performed at [facility], [address], [place], [place] [address]    Report Status 02/07/2022 FINAL  Final  Culture, Respiratory w Gram Stain     Status: None (Preliminary result)   Collection Time: 02/07/22  4:39 PM   Specimen: SPU  Result Value Ref Range Status   Specimen Description   Final    SPUTUM Performed at [facility], [address] [place], [place] [address]    Special Requests   Final    SPUTUM Reflexed from [id number] Performed at [facility], [address] [place], [place] [address]    Gram Stain   Final    FEW SQUAMOUS EPITHELIAL CELLS PRESENT FEW WBC PRESENT, PREDOMINANTLY MONONUCLEAR FEW BUDDING YEAST SEEN FEW GRAM POSITIVE COCCI IN PAIRS RARE GRAM POSITIVE RODS    Culture   Final    FEW YEAST CULTURE REINCUBATED FOR BETTER GROWTH Performed  at [facility], [place] [address] [place], [place] [address]    Report Status PENDING  Incomplete         Impression:   Mr. Kristine Tiley is a 61 y.o.  male with with past medical history including anxiety and depression, GERD, and hypertension, who was admitted to [facility] on 02/06/2022 for Community acquired pneumonia [J18.9] Pneumonia [J18.9]   Acute kidney injury likely secondary to severe illness and diuretic use.   Normal function recorded on 11/12/2021, creatinine 1.01.   Will order renal ultrasound to evaluate obstruction.   IV contrast exposure on 02/06/2022 and previously on 01/28/2022.  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  Patient creatinine on October 7 was at 1.4-1.5. Patient received IV contrast on October 7 Patient creatinine was 1.6 on October 8 Patient did receive another IV contrast on October 16 Patient creatinine was at 1.8 on October 16 Patient creatinine is now at 1.6 Patient was on diuretics-Lasix  and on ACE-lisinopril as an outpatient.    Would recommend holding furosemide and lisinopril for now.  Avoid nephrotoxic agents and therapies.  We will continue to monitor.   Creatinine trend 2023  1.3==>1.6-->1.9 Patient had AKI in June when patient creatinine peaked at 2.4 Patient had AKI in March when patient creatinine peaked at 1.5    Patient AKI is now better Patient responded to IV fluids      2HTN       Blood pressure is stable      3)Anemia of chronic disease/Thrombocytopenia     Latest Ref Rng & Units 02/09/2022    7:46 AM 02/08/2022    4:24 AM 02/07/2022    4:55 AM  CBC  WBC 4.0 - 10.5 K/uL 6.3  10.0  7.8   Hemoglobin 13.0 - 17.0 g/dL 12.0  13.6  11.6   Hematocrit 39.0 - 52.0 % 37.0  41.6  35.7   Platelets 150 - 400 K/uL 161  193  144        HGb at goal (9--11)  Platelet counts were low now better   4) Secondary hyperparathyroidism -CKD Mineral-Bone Disorder    Lab Results  Component Value  Date   CALCIUM 9.2 02/09/2022   PHOS 3.0 10/12/2021   Phosphorus at goal.   5)Multifocal pneumonia Patient is being followed by primary team and pulmonary team   6) Electrolytes      Latest Ref Rng & Units 02/09/2022    7:46 AM 02/08/2022    4:24 AM 02/07/2022    4:55 AM  BMP  Glucose 70 - 99 mg/dL 97  131  116   BUN 6 - 20 mg/dL '21  23  16   '$ Creatinine 0.61 - 1.24 mg/dL 1.52  1.88  1.59   Sodium 135 - 145 mmol/L 139  139  139   Potassium 3.5 - 5.1 mmol/L 4.5  5.5  4.6   Chloride 98 - 111 mmol/L 106  100  103   CO2 22 - 32 mmol/L '25  28  27   '$ Calcium 8.9 - 10.3 mg/dL 9.2  10.1  9.3      Sodium Normonatremic   Potassium Hyperkalemia Potassium is now better   7)Acid base    Co2 at goal     Plan:   Patient responded to IV fluids Patient AKI is better If patient is discharged, patient will benefit from outpatient nephrology follow-up.Karleen [place] s [place] 02/09/2022, 4:55 PM

## 2022-02-09 NOTE — Progress Notes (Signed)
PT Cancellation Note  Patient Details Name: [place] Owens MRN: [id number] DOB: 1961-02-14   Cancelled Treatment:    Reason Eval/Treat Not Completed: PT screened, no needs identified, will sign off (Pt in process of DC. Author stopped by room to make sure pt has no burning desires for PT reassessment. Pt/wife decline, report pt fully back to baseline, has been AMB ad lib in room.) Will sign off at this time.   10:08 AM, 02/09/22 Etta Grandchild, PT, DPT Physical Therapist - [facility]  [phone] ([place])   Eleesha Purkey C 02/09/2022, 10:08 AM

## 2022-02-10 LAB — PROTEIN ELECTROPHORESIS, SERUM
A/G Ratio: 1.3 (ref 0.7–1.7)
Albumin ELP: 4.1 g/dL (ref 2.9–4.4)
Alpha-1-Globulin: 0.4 g/dL (ref 0.0–0.4)
Alpha-2-Globulin: 1.2 g/dL — ABNORMAL HIGH (ref 0.4–1.0)
Beta Globulin: 0.9 g/dL (ref 0.7–1.3)
Gamma Globulin: 0.7 g/dL (ref 0.4–1.8)
Globulin, Total: 3.2 g/dL (ref 2.2–3.9)
Total Protein ELP: 7.3 g/dL (ref 6.0–8.5)

## 2022-02-10 LAB — LEGIONELLA PNEUMOPHILA SEROGP 1 UR AG: L. pneumophila Serogp 1 Ur Ag: NEGATIVE

## 2022-02-10 LAB — CULTURE, RESPIRATORY W GRAM STAIN

## 2022-02-12 ENCOUNTER — Emergency Department: Payer: Medicaid Other

## 2022-02-12 ENCOUNTER — Other Ambulatory Visit: Payer: Self-pay

## 2022-02-12 ENCOUNTER — Emergency Department
Admission: EM | Admit: 2022-02-12 | Discharge: 2022-02-13 | Disposition: A | Discharge status: Home / Self Care | Payer: Medicaid Other | Attending: Emergency Medicine | Admitting: Emergency Medicine

## 2022-02-12 DIAGNOSIS — Z20822 Contact with and (suspected) exposure to covid-19: Secondary | ICD-10-CM | POA: Diagnosis not present

## 2022-02-12 DIAGNOSIS — R0902 Hypoxemia: Secondary | ICD-10-CM | POA: Diagnosis not present

## 2022-02-12 DIAGNOSIS — R059 Cough, unspecified: Secondary | ICD-10-CM | POA: Diagnosis not present

## 2022-02-12 DIAGNOSIS — R0602 Shortness of breath: Secondary | ICD-10-CM | POA: Diagnosis not present

## 2022-02-12 LAB — BASIC METABOLIC PANEL
Anion gap: 7 (ref 5–15)
BUN: 13 mg/dL (ref 6–20)
CO2: 25 mmol/L (ref 22–32)
Calcium: 9.7 mg/dL (ref 8.9–10.3)
Chloride: 107 mmol/L (ref 98–111)
Creatinine, Ser: 1.26 mg/dL — ABNORMAL HIGH (ref 0.61–1.24)
GFR, Estimated: >60 mL/min (ref >60)
Glucose, Bld: 124 mg/dL — ABNORMAL HIGH (ref 70–99)
Potassium: 4.6 mmol/L (ref 3.5–5.1)
Sodium: 139 mmol/L (ref 135–145)

## 2022-02-12 LAB — BLOOD GAS, ARTERIAL
Acid-Base Excess: 3.6 mmol/L — ABNORMAL HIGH (ref 0.0–2.0)
Bicarbonate: 26.3 mmol/L (ref 20.0–28.0)
O2 Saturation: 99 %
Patient temperature: 37
pCO2 arterial: 33 mmHg (ref 32–48)
pH, Arterial: 7.51 — ABNORMAL HIGH (ref 7.35–7.45)
pO2, Arterial: 84 mmHg (ref 83–108)

## 2022-02-12 LAB — BRAIN NATRIURETIC PEPTIDE: B Natriuretic Peptide: 8 pg/mL (ref 0.0–100.0)

## 2022-02-12 LAB — CBC
HCT: 38.1 % — ABNORMAL LOW (ref 39.0–52.0)
Hemoglobin: 12.2 g/dL — ABNORMAL LOW (ref 13.0–17.0)
MCH: 28.6 pg (ref 26.0–34.0)
MCHC: 32 g/dL (ref 30.0–36.0)
MCV: 89.4 fL (ref 80.0–100.0)
Platelets: 163 10*3/uL (ref 150–400)
RBC: 4.26 MIL/uL (ref 4.22–5.81)
RDW: 15.9 % — ABNORMAL HIGH (ref 11.5–15.5)
WBC: 7.7 10*3/uL (ref 4.0–10.5)
nRBC: 0 % (ref 0.0–0.2)

## 2022-02-12 LAB — RESP PANEL BY RT-PCR (FLU A&B, COVID) ARPGX2
Influenza A by PCR: NEGATIVE
Influenza B by PCR: NEGATIVE
SARS Coronavirus 2 by RT PCR: NEGATIVE

## 2022-02-12 LAB — TROPONIN I (HIGH SENSITIVITY)
Troponin I (High Sensitivity): 3 ng/L (ref <18)
Troponin I (High Sensitivity): 5 ng/L (ref <18)

## 2022-02-12 LAB — PROCALCITONIN: Procalcitonin: 0.25 ng/mL

## 2022-02-12 LAB — AMMONIA: Ammonia: 26 umol/L (ref 9–35)

## 2022-02-12 MED ORDER — OXYCODONE HCL 5 MG PO TABS
5.0000 mg | ORAL_TABLET | Freq: Once | ORAL | Status: AC
  Administered 2022-02-12: 5 mg via ORAL
  Filled 2022-02-12: qty 1

## 2022-02-12 MED ORDER — IOHEXOL 350 MG/ML SOLN
75.0000 mL | Freq: Once | INTRAVENOUS | Status: AC | PRN
  Administered 2022-02-12: 75 mL via INTRAVENOUS

## 2022-02-12 NOTE — ED Triage Notes (Signed)
Pt to ED via POV from home. Pt reports increased SOB. Pt recently admitted and discharged for double pneumonia. Pt was not sent home with oxygen. Pt reports oxygen level at home reading was 81%. RA sats 92-93% and pt placed on 2L [place] for comfort. Pt currently on antibiotics and took 1 breathing tx PTA. Last dose of oxycodone at 12pm.

## 2022-02-12 NOTE — ED Provider Notes (Signed)
[facility] Provider Note    Event Date/Time   First MD Initiated Contact with Patient 02/12/22 1931     (approximate)   History   Shortness of Breath   HPI  [place] Owens is a 61 y.o. male  who presents to the emergency department today because of concern for shortness of breath and hypoxia. The patient had recent admission to the hospital secondary to pneumonia. Discharged 3 days ago. Family states that she did notice he has been intermittently confused since discharge as well. Patient has had a productive cough. No blood in his sputum. Patient denies any fevers or chills.       Physical Exam   Triage Vital Signs: ED Triage Vitals [02/12/22 1539]  Enc Vitals Group     BP (!) 151/74     Pulse Rate 86     Resp 18     Temp 98.1 F (36.7 C)     Temp Source Oral     SpO2 92 %     Weight      Height      Head Circumference      Peak Flow      Pain Score 8     Pain Loc      Pain Edu?      Excl. in [place]?     Most recent vital signs: Vitals:   02/12/22 1539  BP: (!) 151/74  Pulse: 86  Resp: 18  Temp: 98.1 F (36.7 C)  SpO2: 92%    General: Awake, alert, oriented. CV:  Good peripheral perfusion. Regular rate and rhythm. Resp:  Slightly increased work of breathing. Lungs clear. Abd:  No distention.    ED Results / Procedures / Treatments   Labs (all labs ordered are listed, but only abnormal results are displayed) Labs Reviewed  BASIC METABOLIC PANEL - Abnormal; Notable for the following components:      Result Value   Glucose, Bld 124 (*)    Creatinine, Ser 1.26 (*)    All other components within normal limits  CBC - Abnormal; Notable for the following components:   Hemoglobin 12.2 (*)    HCT 38.1 (*)    RDW 15.9 (*)    All other components within normal limits  BLOOD GAS, ARTERIAL - Abnormal; Notable for the following components:   pH, Arterial 7.51 (*)    Acid-Base Excess 3.6 (*)    All other components within  normal limits  RESP PANEL BY RT-PCR (FLU A&B, COVID) [id number]  PROCALCITONIN  BRAIN NATRIURETIC PEPTIDE  AMMONIA  TROPONIN I (HIGH SENSITIVITY)  TROPONIN I (HIGH SENSITIVITY)     EKG  I, Nance Pear, attending physician, personally viewed and interpreted this EKG  EKG Time: 1543 Rate: 83 Rhythm: normal sinus rhythm Axis: normal Intervals: qtc 430 QRS: narrow ST changes: no st elevation Impression: normal ekg  RADIOLOGY I independently interpreted and visualized the CXR. My interpretation: bilateral airspace disease Radiology interpretation:  IMPRESSION:  Persistently increased perihilar and bibasilar interstitial  markings, which may reflect edema or atypical infection.   I independently interpreted and visualized the CT angio PE. My interpretation: No large PE Radiology interpretation:  IMPRESSION:  1. No pulmonary embolism.  2. Multifocal pulmonary infiltrates, similar to prior examination,  in keeping with acute infection or potentially aspiration.  3. Moderate emphysema.  4. 4 mm right upper lobe pulmonary nodule. No follow-up needed if  patient is low-risk.This recommendation follows the consensus  statement: Guidelines for  Management of Incidental Pulmonary Nodules  Detected on CT Images: From the Fleischner Society 2017; Radiology  2017; 284:228-243.  5. Diffuse osseous sclerosis, in keeping with condition such as  widespread metastatic disease, hyperparathyroidism, myelofibrosis,  or mastocytosis.   PROCEDURES:  Critical Care performed: No  Procedures   MEDICATIONS ORDERED IN ED: Medications - No data to display   IMPRESSION / MDM / [place] / ED COURSE  I reviewed the triage vital signs and the nursing notes.                              Differential diagnosis includes, but is not limited to, pneumonia, pneumothorax, acs, anemia.  Patient's presentation is most consistent with acute presentation with potential threat to life or  bodily function.  The patient is on the cardiac monitor to evaluate for evidence of arrhythmia and/or significant heart rate changes.  Patient presented to the emergency department today because of concerns for shortness of breath and was recently discharged from the hospital for an admission for shortness of breath secondary to pneumonia.  Patient is afebrile here.  No leukocytosis.  Chest x-ray is consistent with previous imaging. Did check abg given reported history of some confusion as well and this was not consistent with hypercapnia. Ammonia was also checked and was normal. CT angio was obtained given slight tachycardia, SOB and recent hospital stay. CT angio without pe. Continued to show similar findings to previous imaging. At this time I do think it less likely findings represent infection, procalcitonin of .25 and no white count. Additionally patient has received antibiotics already so could be residual radiographic findings of previous infection. Did discuss and offer admission versus discharge home. At this time patient states he would like to be discharged home. I think this is reasonable. We did discuss close follow up with his pulmonologist. Also discussed return precautions for any further desaturation events.   FINAL CLINICAL IMPRESSION(S) / ED DIAGNOSES   Final diagnoses:  Shortness of breath     Note:  This document was prepared using Dragon voice recognition software and may include unintentional dictation errors.    Nance Pear, MD 02/12/22 [phone]

## 2022-02-12 NOTE — ED Notes (Signed)
Pt ambulating approximately 77f with assistance. Pt O2 sat was 93-96% on RA and HR was 120-125s.

## 2022-02-12 NOTE — Discharge Instructions (Signed)
Please seek medical attention for any high fevers, chest pain, shortness of breath, change in behavior, persistent vomiting, bloody stool or any other new or concerning symptoms.  

## 2022-02-12 NOTE — ED Provider Triage Note (Signed)
Emergency Medicine Provider Triage Evaluation Note  Kert Shackett, a 61 y.o. male  was evaluated in triage.  Pt complains of creased shortness of breath.  He presents to the ED from home, with reports of shortness of breath where he was recently admitted for double pneumonia.  He was not sent home on O2, but had O2 readings at home at 81% on room air.  Patient was placed on O2 by nasal cannula here in the ED.  Patient is still finishing his antibiotic course and had a breathing treatment prior to arrival.  He also took his pain medicine a few hours ago.  He denies any fevers, chills, or sweats.  Review of Systems  Positive: SOB Negative: CP, FCS  Physical Exam  BP (!) 151/74 (BP Location: Right Arm)   Pulse 86   Temp 98.1 F (36.7 C) (Oral)   Resp 18   SpO2 92%  Gen:   Awake, no distress  NAD Resp:  Normal effort CTA MSK:   Moves extremities without difficulty  Other:    Medical Decision Making  Medically screening exam initiated at 6:24 PM.  Appropriate orders placed.  Rana Snare was informed that the remainder of the evaluation will be completed by another provider, this initial triage assessment does not replace that evaluation, and the importance of remaining in the ED until their evaluation is complete.  Patient to the ED for evaluation of increased shortness of breath with a recent pneumonia diagnosis.  Patient is currently on 2 L of O2 by nasal cannula for comfort.   Melvenia Needles, PA-C 02/12/22 1826

## 2022-03-17 DIAGNOSIS — J449 Chronic obstructive pulmonary disease, unspecified: Secondary | ICD-10-CM | POA: Diagnosis not present

## 2022-04-16 DIAGNOSIS — J449 Chronic obstructive pulmonary disease, unspecified: Secondary | ICD-10-CM | POA: Diagnosis not present

## 2022-04-20 DIAGNOSIS — G8929 Other chronic pain: Secondary | ICD-10-CM | POA: Diagnosis not present

## 2022-04-20 DIAGNOSIS — J449 Chronic obstructive pulmonary disease, unspecified: Secondary | ICD-10-CM | POA: Diagnosis not present

## 2022-04-20 DIAGNOSIS — M545 Low back pain, unspecified: Secondary | ICD-10-CM | POA: Diagnosis not present

## 2022-04-20 DIAGNOSIS — J9611 Chronic respiratory failure with hypoxia: Secondary | ICD-10-CM | POA: Diagnosis not present

## 2022-04-20 DIAGNOSIS — I10 Essential (primary) hypertension: Secondary | ICD-10-CM | POA: Diagnosis not present

## 2022-04-20 DIAGNOSIS — Z79899 Other long term (current) drug therapy: Secondary | ICD-10-CM | POA: Diagnosis not present

## 2022-04-20 DIAGNOSIS — F419 Anxiety disorder, unspecified: Secondary | ICD-10-CM | POA: Diagnosis not present

## 2022-05-17 DIAGNOSIS — J449 Chronic obstructive pulmonary disease, unspecified: Secondary | ICD-10-CM | POA: Diagnosis not present

## 2022-05-22 DIAGNOSIS — R509 Fever, unspecified: Secondary | ICD-10-CM | POA: Diagnosis not present

## 2022-05-22 DIAGNOSIS — Z03818 Encounter for observation for suspected exposure to other biological agents ruled out: Secondary | ICD-10-CM | POA: Diagnosis not present

## 2022-05-22 DIAGNOSIS — R051 Acute cough: Secondary | ICD-10-CM | POA: Diagnosis not present

## 2022-05-22 DIAGNOSIS — U071 COVID-19: Secondary | ICD-10-CM | POA: Diagnosis not present

## 2022-06-08 ENCOUNTER — Institutional Professional Consult (permissible substitution): Payer: Medicaid Other | Admitting: Pulmonary Disease

## 2022-06-12 DIAGNOSIS — U071 COVID-19: Secondary | ICD-10-CM | POA: Diagnosis not present

## 2022-06-17 DIAGNOSIS — J449 Chronic obstructive pulmonary disease, unspecified: Secondary | ICD-10-CM | POA: Diagnosis not present

## 2022-06-20 DIAGNOSIS — Z79899 Other long term (current) drug therapy: Secondary | ICD-10-CM | POA: Diagnosis not present

## 2022-06-20 DIAGNOSIS — Z13220 Encounter for screening for lipoid disorders: Secondary | ICD-10-CM | POA: Diagnosis not present

## 2022-06-20 DIAGNOSIS — Z125 Encounter for screening for malignant neoplasm of prostate: Secondary | ICD-10-CM | POA: Diagnosis not present

## 2022-06-26 DIAGNOSIS — Z1331 Encounter for screening for depression: Secondary | ICD-10-CM | POA: Diagnosis not present

## 2022-06-26 DIAGNOSIS — E782 Mixed hyperlipidemia: Secondary | ICD-10-CM | POA: Diagnosis not present

## 2022-06-26 DIAGNOSIS — Z0000 Encounter for general adult medical examination without abnormal findings: Secondary | ICD-10-CM | POA: Diagnosis not present

## 2022-06-26 DIAGNOSIS — R7303 Prediabetes: Secondary | ICD-10-CM | POA: Diagnosis not present

## 2022-06-26 DIAGNOSIS — J449 Chronic obstructive pulmonary disease, unspecified: Secondary | ICD-10-CM | POA: Diagnosis not present

## 2022-06-26 DIAGNOSIS — G4719 Other hypersomnia: Secondary | ICD-10-CM | POA: Diagnosis not present

## 2022-06-26 DIAGNOSIS — Z79899 Other long term (current) drug therapy: Secondary | ICD-10-CM | POA: Diagnosis not present

## 2022-06-26 DIAGNOSIS — R0681 Apnea, not elsewhere classified: Secondary | ICD-10-CM | POA: Diagnosis not present

## 2022-06-28 ENCOUNTER — Institutional Professional Consult (permissible substitution): Payer: Medicaid Other | Admitting: Pulmonary Disease

## 2022-07-16 DIAGNOSIS — J449 Chronic obstructive pulmonary disease, unspecified: Secondary | ICD-10-CM | POA: Diagnosis not present

## 2022-08-08 DIAGNOSIS — G4733 Obstructive sleep apnea (adult) (pediatric): Secondary | ICD-10-CM | POA: Diagnosis not present

## 2022-08-15 DIAGNOSIS — M545 Low back pain, unspecified: Secondary | ICD-10-CM | POA: Diagnosis not present

## 2022-08-15 DIAGNOSIS — M542 Cervicalgia: Secondary | ICD-10-CM | POA: Diagnosis not present

## 2022-08-16 DIAGNOSIS — J449 Chronic obstructive pulmonary disease, unspecified: Secondary | ICD-10-CM | POA: Diagnosis not present

## 2022-08-31 DIAGNOSIS — G4733 Obstructive sleep apnea (adult) (pediatric): Secondary | ICD-10-CM | POA: Diagnosis not present

## 2022-08-31 DIAGNOSIS — I160 Hypertensive urgency: Secondary | ICD-10-CM | POA: Diagnosis not present

## 2022-09-15 DIAGNOSIS — J449 Chronic obstructive pulmonary disease, unspecified: Secondary | ICD-10-CM | POA: Diagnosis not present

## 2022-09-25 DIAGNOSIS — Z79899 Other long term (current) drug therapy: Secondary | ICD-10-CM | POA: Diagnosis not present

## 2022-09-25 DIAGNOSIS — R7303 Prediabetes: Secondary | ICD-10-CM | POA: Diagnosis not present

## 2022-09-25 DIAGNOSIS — E782 Mixed hyperlipidemia: Secondary | ICD-10-CM | POA: Diagnosis not present

## 2022-09-29 DIAGNOSIS — J441 Chronic obstructive pulmonary disease with (acute) exacerbation: Secondary | ICD-10-CM | POA: Diagnosis not present

## 2022-09-29 DIAGNOSIS — I10 Essential (primary) hypertension: Secondary | ICD-10-CM | POA: Diagnosis not present

## 2022-09-29 DIAGNOSIS — Z79899 Other long term (current) drug therapy: Secondary | ICD-10-CM | POA: Diagnosis not present

## 2022-09-29 DIAGNOSIS — R404 Transient alteration of awareness: Secondary | ICD-10-CM | POA: Diagnosis not present

## 2022-09-29 DIAGNOSIS — E782 Mixed hyperlipidemia: Secondary | ICD-10-CM | POA: Diagnosis not present

## 2022-10-16 DIAGNOSIS — J449 Chronic obstructive pulmonary disease, unspecified: Secondary | ICD-10-CM | POA: Diagnosis not present

## 2022-10-18 DIAGNOSIS — G4733 Obstructive sleep apnea (adult) (pediatric): Secondary | ICD-10-CM | POA: Diagnosis not present

## 2022-11-03 ENCOUNTER — Other Ambulatory Visit: Payer: Self-pay | Admitting: Physician Assistant

## 2022-11-03 DIAGNOSIS — Z7689 Persons encountering health services in other specified circumstances: Secondary | ICD-10-CM | POA: Diagnosis not present

## 2022-11-03 DIAGNOSIS — R159 Full incontinence of feces: Secondary | ICD-10-CM | POA: Diagnosis not present

## 2022-11-03 DIAGNOSIS — G8929 Other chronic pain: Secondary | ICD-10-CM | POA: Diagnosis not present

## 2022-11-03 DIAGNOSIS — M5441 Lumbago with sciatica, right side: Secondary | ICD-10-CM | POA: Diagnosis not present

## 2022-11-03 DIAGNOSIS — R32 Unspecified urinary incontinence: Secondary | ICD-10-CM

## 2022-11-03 DIAGNOSIS — R262 Difficulty in walking, not elsewhere classified: Secondary | ICD-10-CM | POA: Diagnosis not present

## 2022-11-03 DIAGNOSIS — R404 Transient alteration of awareness: Secondary | ICD-10-CM

## 2022-11-03 DIAGNOSIS — R251 Tremor, unspecified: Secondary | ICD-10-CM | POA: Diagnosis not present

## 2022-11-03 DIAGNOSIS — R569 Unspecified convulsions: Secondary | ICD-10-CM

## 2022-11-03 DIAGNOSIS — R2689 Other abnormalities of gait and mobility: Secondary | ICD-10-CM | POA: Diagnosis not present

## 2022-11-10 DIAGNOSIS — R109 Unspecified abdominal pain: Secondary | ICD-10-CM | POA: Diagnosis not present

## 2022-11-10 DIAGNOSIS — M48062 Spinal stenosis, lumbar region with neurogenic claudication: Secondary | ICD-10-CM | POA: Diagnosis not present

## 2022-11-15 DIAGNOSIS — J449 Chronic obstructive pulmonary disease, unspecified: Secondary | ICD-10-CM | POA: Diagnosis not present

## 2022-11-16 DIAGNOSIS — R059 Cough, unspecified: Secondary | ICD-10-CM | POA: Diagnosis not present

## 2022-11-16 DIAGNOSIS — R051 Acute cough: Secondary | ICD-10-CM | POA: Diagnosis not present

## 2022-11-16 DIAGNOSIS — R918 Other nonspecific abnormal finding of lung field: Secondary | ICD-10-CM | POA: Diagnosis not present

## 2022-11-20 ENCOUNTER — Encounter: Payer: Self-pay | Admitting: Physician Assistant

## 2022-11-22 ENCOUNTER — Other Ambulatory Visit: Payer: Medicaid Other

## 2022-11-26 ENCOUNTER — Inpatient Hospital Stay (HOSPITAL_COMMUNITY)
Admission: EM | Admit: 2022-11-26 | Discharge: 2022-11-30 | DRG: 605 | Disposition: A | Discharge status: Home / Self Care | Payer: Medicaid Other | Attending: Neurology | Admitting: Neurology

## 2022-11-26 ENCOUNTER — Emergency Department: Payer: Medicaid Other

## 2022-11-26 ENCOUNTER — Encounter (HOSPITAL_COMMUNITY): Payer: Self-pay

## 2022-11-26 ENCOUNTER — Emergency Department
Admission: EM | Admit: 2022-11-26 | Discharge: 2022-11-26 | Disposition: A | Discharge status: Other Acute Inpatient Hospital | Payer: Medicaid Other | Source: Home / Self Care | Attending: Emergency Medicine | Admitting: Emergency Medicine

## 2022-11-26 DIAGNOSIS — J449 Chronic obstructive pulmonary disease, unspecified: Secondary | ICD-10-CM | POA: Insufficient documentation

## 2022-11-26 DIAGNOSIS — S301XXA Contusion of abdominal wall, initial encounter: Secondary | ICD-10-CM | POA: Insufficient documentation

## 2022-11-26 DIAGNOSIS — R109 Unspecified abdominal pain: Secondary | ICD-10-CM | POA: Diagnosis not present

## 2022-11-26 DIAGNOSIS — F17210 Nicotine dependence, cigarettes, uncomplicated: Secondary | ICD-10-CM | POA: Diagnosis present

## 2022-11-26 DIAGNOSIS — D62 Acute posthemorrhagic anemia: Secondary | ICD-10-CM | POA: Diagnosis not present

## 2022-11-26 DIAGNOSIS — R4182 Altered mental status, unspecified: Secondary | ICD-10-CM | POA: Diagnosis not present

## 2022-11-26 DIAGNOSIS — S299XXA Unspecified injury of thorax, initial encounter: Secondary | ICD-10-CM | POA: Diagnosis not present

## 2022-11-26 DIAGNOSIS — M1990 Unspecified osteoarthritis, unspecified site: Secondary | ICD-10-CM | POA: Diagnosis present

## 2022-11-26 DIAGNOSIS — Z79899 Other long term (current) drug therapy: Secondary | ICD-10-CM | POA: Diagnosis not present

## 2022-11-26 DIAGNOSIS — I129 Hypertensive chronic kidney disease with stage 1 through stage 4 chronic kidney disease, or unspecified chronic kidney disease: Secondary | ICD-10-CM | POA: Diagnosis present

## 2022-11-26 DIAGNOSIS — R569 Unspecified convulsions: Secondary | ICD-10-CM | POA: Diagnosis not present

## 2022-11-26 DIAGNOSIS — F419 Anxiety disorder, unspecified: Secondary | ICD-10-CM | POA: Diagnosis present

## 2022-11-26 DIAGNOSIS — Z880 Allergy status to penicillin: Secondary | ICD-10-CM | POA: Diagnosis not present

## 2022-11-26 DIAGNOSIS — W06XXXA Fall from bed, initial encounter: Secondary | ICD-10-CM | POA: Diagnosis present

## 2022-11-26 DIAGNOSIS — R296 Repeated falls: Secondary | ICD-10-CM | POA: Diagnosis present

## 2022-11-26 DIAGNOSIS — J439 Emphysema, unspecified: Secondary | ICD-10-CM | POA: Diagnosis not present

## 2022-11-26 DIAGNOSIS — R7402 Elevation of levels of lactic acid dehydrogenase (LDH): Secondary | ICD-10-CM | POA: Diagnosis not present

## 2022-11-26 DIAGNOSIS — Z888 Allergy status to other drugs, medicaments and biological substances status: Secondary | ICD-10-CM | POA: Diagnosis not present

## 2022-11-26 DIAGNOSIS — T148XXA Other injury of unspecified body region, initial encounter: Secondary | ICD-10-CM

## 2022-11-26 DIAGNOSIS — E871 Hypo-osmolality and hyponatremia: Secondary | ICD-10-CM | POA: Diagnosis present

## 2022-11-26 DIAGNOSIS — W19XXXA Unspecified fall, initial encounter: Secondary | ICD-10-CM | POA: Diagnosis not present

## 2022-11-26 DIAGNOSIS — Z881 Allergy status to other antibiotic agents status: Secondary | ICD-10-CM

## 2022-11-26 DIAGNOSIS — Z7951 Long term (current) use of inhaled steroids: Secondary | ICD-10-CM

## 2022-11-26 DIAGNOSIS — N189 Chronic kidney disease, unspecified: Secondary | ICD-10-CM | POA: Diagnosis not present

## 2022-11-26 DIAGNOSIS — N1832 Chronic kidney disease, stage 3b: Secondary | ICD-10-CM | POA: Diagnosis present

## 2022-11-26 DIAGNOSIS — R739 Hyperglycemia, unspecified: Secondary | ICD-10-CM | POA: Diagnosis not present

## 2022-11-26 DIAGNOSIS — D649 Anemia, unspecified: Secondary | ICD-10-CM | POA: Insufficient documentation

## 2022-11-26 DIAGNOSIS — K5730 Diverticulosis of large intestine without perforation or abscess without bleeding: Secondary | ICD-10-CM | POA: Diagnosis not present

## 2022-11-26 DIAGNOSIS — I10 Essential (primary) hypertension: Secondary | ICD-10-CM | POA: Diagnosis not present

## 2022-11-26 DIAGNOSIS — R0689 Other abnormalities of breathing: Secondary | ICD-10-CM | POA: Diagnosis not present

## 2022-11-26 DIAGNOSIS — R1012 Left upper quadrant pain: Secondary | ICD-10-CM | POA: Diagnosis present

## 2022-11-26 DIAGNOSIS — R110 Nausea: Secondary | ICD-10-CM | POA: Diagnosis not present

## 2022-11-26 DIAGNOSIS — R1084 Generalized abdominal pain: Secondary | ICD-10-CM | POA: Diagnosis not present

## 2022-11-26 DIAGNOSIS — R6889 Other general symptoms and signs: Secondary | ICD-10-CM | POA: Diagnosis not present

## 2022-11-26 DIAGNOSIS — R000 Tachycardia, unspecified: Secondary | ICD-10-CM | POA: Insufficient documentation

## 2022-11-26 LAB — CBC
HCT: 37.2 % — ABNORMAL LOW (ref 39.0–52.0)
HCT: 36.2 % — ABNORMAL LOW (ref 39.0–52.0)
Hemoglobin: 12.9 g/dL — ABNORMAL LOW (ref 13.0–17.0)
Hemoglobin: 11.9 g/dL — ABNORMAL LOW (ref 13.0–17.0)
MCH: 33 pg (ref 26.0–34.0)
MCH: 31.9 pg (ref 26.0–34.0)
MCHC: 34.7 g/dL (ref 30.0–36.0)
MCHC: 32.9 g/dL (ref 30.0–36.0)
MCV: 95.1 fL (ref 80.0–100.0)
MCV: 97.1 fL (ref 80.0–100.0)
Platelets: 310 10*3/uL (ref 150–400)
Platelets: 320 10*3/uL (ref 150–400)
RBC: 3.91 MIL/uL — ABNORMAL LOW (ref 4.22–5.81)
RBC: 3.73 MIL/uL — ABNORMAL LOW (ref 4.22–5.81)
RDW: 14 % (ref 11.5–15.5)
RDW: 14.2 % (ref 11.5–15.5)
WBC: 18.6 10*3/uL — ABNORMAL HIGH (ref 4.0–10.5)
WBC: 25 10*3/uL — ABNORMAL HIGH (ref 4.0–10.5)
nRBC: 0.2 % (ref 0.0–0.2)
nRBC: 0.3 % — ABNORMAL HIGH (ref 0.0–0.2)

## 2022-11-26 LAB — PROTIME-INR
INR: 1.1 (ref 0.8–1.2)
Prothrombin Time: 14.2 seconds (ref 11.4–15.2)

## 2022-11-26 LAB — TROPONIN I (HIGH SENSITIVITY): Troponin I (High Sensitivity): 7 ng/L (ref <18)

## 2022-11-26 LAB — COMPREHENSIVE METABOLIC PANEL
ALT: 25 U/L (ref 0–44)
AST: 24 U/L (ref 15–41)
Albumin: 4.2 g/dL (ref 3.5–5.0)
Alkaline Phosphatase: 168 U/L — ABNORMAL HIGH (ref 38–126)
Anion gap: 12 (ref 5–15)
BUN: 11 mg/dL (ref 8–23)
CO2: 21 mmol/L — ABNORMAL LOW (ref 22–32)
Calcium: 8.8 mg/dL — ABNORMAL LOW (ref 8.9–10.3)
Chloride: 102 mmol/L (ref 98–111)
Creatinine, Ser: 1.4 mg/dL — ABNORMAL HIGH (ref 0.61–1.24)
GFR, Estimated: 57 mL/min — ABNORMAL LOW (ref >60)
Glucose, Bld: 259 mg/dL — ABNORMAL HIGH (ref 70–99)
Potassium: 3.6 mmol/L (ref 3.5–5.1)
Sodium: 135 mmol/L (ref 135–145)
Total Bilirubin: 0.9 mg/dL (ref 0.3–1.2)
Total Protein: 6.9 g/dL (ref 6.5–8.1)

## 2022-11-26 LAB — TYPE AND SCREEN
ABO/RH(D): O POS
Antibody Screen: NEGATIVE

## 2022-11-26 LAB — LACTIC ACID, PLASMA: Lactic Acid, Venous: 2.8 mmol/L (ref 0.5–1.9)

## 2022-11-26 LAB — I-STAT CREATININE, ED: Creatinine, Ser: 1.6 mg/dL — ABNORMAL HIGH (ref 0.61–1.24)

## 2022-11-26 LAB — LIPASE, BLOOD: Lipase: 28 U/L (ref 11–51)

## 2022-11-26 MED ORDER — HYDROMORPHONE HCL 1 MG/ML IJ SOLN
0.5000 mg | Freq: Once | INTRAMUSCULAR | Status: AC
  Administered 2022-11-27: 0.5 mg via INTRAVENOUS
  Filled 2022-11-26: qty 1

## 2022-11-26 MED ORDER — ONDANSETRON HCL 4 MG/2ML IJ SOLN
4.0000 mg | INTRAMUSCULAR | Status: AC
  Administered 2022-11-26: 4 mg via INTRAVENOUS
  Filled 2022-11-26: qty 2

## 2022-11-26 MED ORDER — MORPHINE SULFATE (PF) 4 MG/ML IV SOLN
4.0000 mg | Freq: Once | INTRAVENOUS | Status: DC
  Filled 2022-11-26: qty 1

## 2022-11-26 MED ORDER — MORPHINE SULFATE (PF) 4 MG/ML IV SOLN
4.0000 mg | Freq: Once | INTRAVENOUS | Status: AC
  Administered 2022-11-26: 4 mg via INTRAVENOUS
  Filled 2022-11-26: qty 1

## 2022-11-26 MED ORDER — IOHEXOL 300 MG/ML  SOLN
100.0000 mL | Freq: Once | INTRAMUSCULAR | Status: AC | PRN
  Administered 2022-11-26: 100 mL via INTRAVENOUS

## 2022-11-26 MED ORDER — FENTANYL CITRATE PF 50 MCG/ML IJ SOSY
50.0000 ug | PREFILLED_SYRINGE | Freq: Once | INTRAMUSCULAR | Status: AC
  Administered 2022-11-26: 50 ug via INTRAVENOUS
  Filled 2022-11-26: qty 1

## 2022-11-26 MED ORDER — ONDANSETRON HCL 4 MG/2ML IJ SOLN
4.0000 mg | Freq: Once | INTRAMUSCULAR | Status: AC
  Administered 2022-11-27: 4 mg via INTRAVENOUS
  Filled 2022-11-26: qty 2

## 2022-11-26 NOTE — ED Provider Notes (Signed)
Patient is a 63 year old male transferred from [facility] due to expanding hematoma of the flank with some active extravasation.  Patient was accepted by Dr. Andrey Campanile and he was alerted when patient arrived.  Patient is tachycardic but blood pressure is stable.  Repeat hemoglobin patient will be admitted to the trauma service.   Gwyneth Sprout, MD 11/26/22 [phone]

## 2022-11-26 NOTE — ED Provider Notes (Signed)
[facility] Provider Note    Event Date/Time   First MD Initiated Contact with Patient 11/26/22 1818     (approximate)   History   Abdominal pain  HPI  Henry Owens is a 62 y.o. male history of COPD, prior multifocal pneumonia, sepsis, overdose, presentations for unresponsiveness, cytopenia    Patient reports 2 days ago he fell out of bed onto his left flank is been slightly sore.  Today however he coughed and he felt immediate pain and a tearing feeling in his left upper abdomen and swelling over his left abdomen from his left ribs down to the left side with some bruising developing this afternoon.  Reports severe 10 out of 10 pain  No chest pain no shortness of breath.  Reports he also feels a bit nauseated  He does not take any blood thinning medication  He denies any head injury, reports the fall was 2 days ago he did not think much of it, but suddenly the pain got severe after he coughed today  No fevers or chills  Physical Exam   Triage Vital Signs: ED Triage Vitals  Encounter Vitals Group     BP      Systolic BP Percentile      Diastolic BP Percentile      Pulse      Resp      Temp      Temp src      SpO2      Weight      Height      Head Circumference      Peak Flow      Pain Score      Pain Loc      Pain Education      Exclude from Growth Chart     Most recent vital signs: Vitals:   11/26/22 2100 11/26/22 2117  BP: (!) 138/92 (!) 136/91  Pulse: (!) 104 (!) 104  Resp: 10 14  Temp: 97.6 F (36.4 C) 97.6 F (36.4 C)  SpO2: 100% 95%     General: Awake, slightly diaphoretic, mildly tachycardic, appears in painful distress CV:  Good peripheral perfusion.  Mild tachycardia Resp:  Normal effort.  Clear bilateral with normal effort Abd:  No distention.  Patient has significant swelling and some bruising over the left flank.  Appears to have what I would [place] is likely a hematoma forming.  Denies pain over the right  side.  There is no pelvic pain extremities are negative for injury.  Normocephalic atraumatic no cervical tenderness Other:     ED Results / Procedures / Treatments   Labs (all labs ordered are listed, but only abnormal results are displayed) Labs Reviewed  CBC - Abnormal; Notable for the following components:      Result Value   WBC 18.6 (*)    RBC 3.91 (*)    Hemoglobin 12.9 (*)    HCT 37.2 (*)    All other components within normal limits  COMPREHENSIVE METABOLIC PANEL - Abnormal; Notable for the following components:   CO2 21 (*)    Glucose, Bld 259 (*)    Creatinine, Ser 1.40 (*)    Calcium 8.8 (*)    Alkaline Phosphatase 168 (*)    GFR, Estimated 57 (*)    All other components within normal limits  LACTIC ACID, PLASMA - Abnormal; Notable for the following components:   Lactic Acid, Venous 2.8 (*)    All other components within normal  limits  I-STAT CREATININE, ED - Abnormal; Notable for the following components:   Creatinine, Ser 1.60 (*)    All other components within normal limits  LIPASE, BLOOD  PROTIME-INR  URINALYSIS, ROUTINE W REFLEX MICROSCOPIC  I-STAT CREATININE (MANUAL ENTRY)  TYPE AND SCREEN  TYPE AND SCREEN  TROPONIN I (HIGH SENSITIVITY)   Labs interpreted as elevated lactic, creatinine 1.4, mild anemia hemoglobin 12.9 which approximates his baseline  EKG  Is interpreted by me at 1822 heart rate 100 QRS 140 QTc 450 Sinus tachycardia diffuse picture of potential ischemia or subendocardial ischemic picture.  Of note the patient denies any chest pain his initial troponin is normal   RADIOLOGY  CT scan interpreted by me is concerning for left-sided hematoma.  Discussed with radiologist as well   CT CHEST ABDOMEN PELVIS W CONTRAST  Result Date: 11/26/2022 CLINICAL DATA:  Polytrauma, blunt LEFT flank pain, bruising, suspect large hematoma EXAM: CT CHEST, ABDOMEN, AND PELVIS WITH CONTRAST TECHNIQUE: Multidetector CT imaging of the chest, abdomen and pelvis  was performed following the standard protocol during bolus administration of intravenous contrast. RADIATION DOSE REDUCTION: This exam was performed according to the departmental dose-optimization program which includes automated exposure control, adjustment of the mA and/or kV according to patient size and/or use of iterative reconstruction technique. CONTRAST:  OMNIPAQUE IOHEXOL 300 MG/ML  SOLN COMPARISON:  Chest CT 02/12/22 FINDINGS: CT CHEST FINDINGS Cardiovascular: No evidence of acute aortic or vascular injury. Mild aortic atherosclerosis. Noncalcified plaque in the proximal aortic branch vessels. The heart is normal in size. No pericardial effusion. Mediastinum/Nodes: No mediastinal hemorrhage or hematoma. No mediastinal adenopathy. No esophageal wall thickening. Minimal fluid in the mid esophagus. No pneumomediastinum. Lungs/Pleura: No pneumothorax. Moderate emphysema and bronchial thickening. No pulmonary contusion. No focal airspace disease. Areas of subsegmental atelectasis or scarring within the left upper and lower lobes. The ground-glass and consolidative opacities have otherwise resolved. Musculoskeletal: No acute fracture of the ribs, sternum, included clavicles or shoulder girdles. Degenerative change in the thoracic spine without acute fracture. There is diffuse sclerotic appearance of the osseous structures. CT ABDOMEN PELVIS FINDINGS Hepatobiliary: No hepatic injury or perihepatic hematoma. Subcentimeter hypodensity in the left lobe of the liver is too small to characterize but likely cyst. No further follow-up imaging is recommended. Gallbladder is unremarkable. Pancreas: No evidence of pancreatic injury. No ductal dilatation or inflammation. Spleen: Stranding in the left upper quadrant is likely related to soft tissue injury, there is no evidence of splenic laceration or injury. Adrenals/Urinary Tract: No adrenal hemorrhage or renal injury identified. Small bilateral simple appearing  renal cysts. No further follow-up imaging is recommended. Bladder is unremarkable. Stomach/Bowel: No evidence of bowel injury or mesenteric hematoma. Colonic diverticulosis without diverticulitis. Appendix is not visualized. Mild fluid-filled stomach without gastric wall thickening. Vascular/Lymphatic: Large left lateral abdominal wall/flank hematoma with small focus of active extravasation, series 2, image 72. Vascular blush persists on delayed phase. No evidence of aortic injury. There is no retroperitoneal fluid. Left retroaortic renal vein. No adenopathy. Reproductive: Prostate is unremarkable. Other: Large left abdominal wall/flank hematoma with focus of active extravasation, series 2, image 72. Active extravasation arises just inferior to the lateral left tenth rib. This measures approximately 14.1 x 6.2 x 13.7 cm, series 2, image 84 and series 5, image 37. There is thickening of the subjacent lateral fascia. No free fluid in the abdominopelvic cavity. No free air. Musculoskeletal: No acute fracture of the pelvis or lumbar spine. L4 through S1 fusion hardware. Diffuse bony  sclerosis. IMPRESSION: 1. Large left lateral abdominal wall/flank hematoma with focus of active extravasation. No adjacent rib fracture. 2. No additional acute traumatic injury to the chest, abdomen, or pelvis. 3. Diffuse sclerotic appearance of the osseous structures, a chronic finding. 4. Emphysema. 5. Colonic diverticulosis without diverticulitis. Aortic Atherosclerosis (ICD10-I70.0) and Emphysema (ICD10-J43.9). Critical Value/emergent results were called by telephone at the time of interpretation on 11/26/2022 at 7:36 pm to provider   , who verbally acknowledged these results. Electronically Signed   By: Narda Rutherford M.D.   On: 11/26/2022 19:41      PROCEDURES:  Critical Care performed: Yes, see critical care procedure note(s)  CRITICAL CARE Performed by: Sharyn Creamer   Total critical care time: 35  minutes  Critical care time was exclusive of separately billable procedures and treating other patients.  Critical care was necessary to treat or prevent imminent or life-threatening deterioration.  Critical care was time spent personally by me on the following activities: development of treatment plan with patient and/or surrogate as well as nursing, discussions with consultants, evaluation of patient's response to treatment, examination of patient, obtaining history from patient or surrogate, ordering and performing treatments and interventions, ordering and review of laboratory studies, ordering and review of radiographic studies, pulse oximetry and re-evaluation of patient's condition.  Procedures   MEDICATIONS ORDERED IN ED: Medications  morphine (PF) 4 MG/ML injection 4 mg (4 mg Intravenous Given 11/26/22 1832)  ondansetron (ZOFRAN) injection 4 mg (4 mg Intravenous Given 11/26/22 1832)  iohexol (OMNIPAQUE) 300 MG/ML solution 100 mL (100 mLs Intravenous Contrast Given 11/26/22 1900)  morphine (PF) 4 MG/ML injection 4 mg (4 mg Intravenous Given 11/26/22 1925)  ondansetron (ZOFRAN) injection 4 mg (4 mg Intravenous Given 11/26/22 2009)  fentaNYL (SUBLIMAZE) injection 50 mcg (50 mcg Intravenous Given 11/26/22 2127)     IMPRESSION / MDM / ASSESSMENT AND PLAN / ED COURSE  I reviewed the triage vital signs and the nursing notes.                              Differential diagnosis includes, but is not limited to, large flank hematoma, intra-abdominal hematoma, hemoperitoneum, splenic laceration, hemothorax pneumothorax rib fracture etc.  The patient's examination is quite consistent with significant hematoma or swelling over his left flank.  His hemodynamics are relatively stable with slight tachycardia he is slightly diaphoretic his hemoglobin appropriate he has no hypotension.  Heart rate seems to actually have improved after pain has improved he reports very severe pain improving notably after  morphine administration.  He is [place] less diaphoretic  Consulted with our vascular surgeon, Dr. Luiz Blare, he advises recommendation to transport patient to service with trauma surgery.  Discussed with patient patient understanding agreeable with plan to transfer to [facility].  Discussed with trauma surgeon Dr. Gaynelle Adu who accepts the patient in transfer to the [facility], ER.  Additionally, discussed case with and notified ED physician Dr. Anitra Lauth of the patient's anticipated transfer  Patient's presentation is most consistent with acute presentation with potential threat to life or bodily function.   The patient is on the cardiac monitor to evaluate for evidence of arrhythmia and/or significant heart rate changes.   Clinical Course as of 11/26/22 2345  Sun Nov 26, 2022  1940 Discussed with Dr. Luiz Blare, vascular surgery, advises  [MQ]    Clinical Course User Index [MQ] Sharyn Creamer, MD   ----------------------------------------- 6:39 PM on 11/26/2022 ----------------------------------------- Patient pain  improving more comfortable   ----------------------------------------- 9:11 PM on 11/26/2022 ----------------------------------------- Fully awake and alert, pain improved with morphine.  Remains fully alert and oriented.  Stable for transfer to trauma services center/[place], ER to be seen and evaluated by Dr. Andrey Campanile  Patient good understanding agreeable with plan for transfer.  Evaluated prior to transfer he is awake alert stable for transfer to higher level of care with available trauma services as recommended by our vascular consult  FINAL CLINICAL IMPRESSION(S) / ED DIAGNOSES   Final diagnoses:  Hematoma     Rx / DC Orders   ED Discharge Orders     None        Note:  This document was prepared using Dragon voice recognition software and may include unintentional dictation errors.   Sharyn Creamer, MD 11/26/22 [phone]

## 2022-11-26 NOTE — ED Triage Notes (Signed)
Patient presents 2 days s/p fall with abdominal pain and large bruise to his LEFT side; Patient is tachycardic, diaphoretic, and appears pale upon arrival

## 2022-11-26 NOTE — ED Triage Notes (Addendum)
Patient arrives from [place] via [facility] for abdominal pain and flank pain on the let side. Patient presents clammy, pale, with hematoma to the left flank.

## 2022-11-26 NOTE — H&P (Signed)
CC: bad side pain  Requesting provider: Dr Fanny Bien  HPI: Henry Owens is an 62 y.o. male was transferred from [facility] ER for further evaluation.  Patient presented there earlier today complaining of a fall from Tuesday. Developed discomfort on his side afterwards but sneezed earlier today and had severe left-sided pain with swelling. Went to [facility] and was evaluated.    Patient reports that he actually fell out of bed.  He reports that he has had several events over the past year where he will have what he believes is either a seizure or some type of neurological events or periods of unresponsiveness.  He states that he will tune out and not be able to speak to his wife if she asked him a question.  He states that he saw a neurologist and is awaiting insurance approval for an MRI.  He also reports having intermittent tremors in both hands.  He denies drinking.  He quit smoking about a year ago.  He denies any cardiac history.  No TIAs or amaurosis fugax  Past Medical History:  Diagnosis Date   Anxiety    Arthritis    Flu 07/2017    Past Surgical History:  Procedure Laterality Date   APPENDECTOMY  1975   BACK SURGERY     KNEE ARTHROSCOPY WITH MEDIAL MENISECTOMY Right 08/20/2017   Procedure: KNEE ARTHROSCOPY WITH PARTIAL MEDIAL AND LATERAL MENISECTOMY,PARTIAL SYNOVECTOMY,CHONDROPLASTY, LOOSE BODY REMOVAL;  Surgeon: Lyndle Herrlich, MD;  Location: [facility] ORS;  Service: Orthopedics;  Laterality: Right;   KNEE SURGERY  1975   1996, 2000    Family History  Problem Relation Age of Onset   Bladder Cancer Neg Hx    Prostate cancer Neg Hx    Hematuria Neg Hx     Social:  reports that he has been smoking cigarettes. He has a 37.5 pack-year smoking history. He has never used smokeless tobacco. He reports that he does not currently use drugs. He reports that he does not drink alcohol.  Allergies:  Allergies  Allergen Reactions   Wound Dressing Adhesive Other  (See Comments)    BLISTER   Azithromycin Rash   Penicillin G Rash    Has patient had a PCN reaction causing immediate rash, facial/tongue/throat swelling, SOB or lightheadedness with hypotension: Yes Has patient had a PCN reaction causing severe rash involving mucus membranes or skin necrosis: No Has patient had a PCN reaction that required hospitalization: No Has patient had a PCN reaction occurring within the last 10 years: No If all of the above answers are "NO", then may proceed with Cephalosporin use.     Medications: I have reviewed the patient's current medications.   ROS - all of the below systems have been reviewed with the patient and positives are indicated with bold text General: chills, fever or night sweats Eyes: blurry vision or double vision ENT: epistaxis or sore throat Allergy/Immunology: itchy/watery eyes or nasal congestion Hematologic/Lymphatic: bleeding problems, blood clots or swollen lymph nodes Endocrine: temperature intolerance or unexpected weight changes Breast: new or changing breast lumps or nipple discharge Resp: cough, shortness of breath, or wheezing CV: chest pain or dyspnea on exertion GI: as per HPI GU: dysuria, trouble voiding, or hematuria MSK: joint pain or joint stiffness Neuro: TIA or stroke symptoms Derm: pruritus and skin lesion changes Psych: anxiety and depression  PE Blood pressure (!) 112/92, pulse (!) 107, temperature 97.6 F (36.4 C), temperature source Oral, resp. rate 11, SpO2 99%. Constitutional: NAD;  conversant; no deformities; pale Eyes: Moist conjunctiva; no lid lag; anicteric; PERRL Neck: Trachea midline; no thyromegaly Lungs: Normal respiratory effort; no tactile fremitus;  CV: RRR; no palpable thrills; no pitting edema GI: Abd soft, nt, nd; no palpable hepatosplenomegaly MSK: Normal gait; no clubbing/cyanosis; large amount of soft tissue swelling L chest/flank - no discoloration; TTP.  Psychiatric: Appropriate affect;  alert and oriented x3 Lymphatic: No palpable cervical or axillary lymphadenopathy Skin:pale,  Results for orders placed or performed during the hospital encounter of 11/26/22 (from the past 48 hour(s))  CBC     Status: Abnormal   Collection Time: 11/26/22  6:20 PM  Result Value Ref Range   WBC 18.6 (H) 4.0 - 10.5 K/uL   RBC 3.91 (L) 4.22 - 5.81 MIL/uL   Hemoglobin 12.9 (L) 13.0 - 17.0 g/dL   HCT 16.1 (L) 09.6 - 04.5 %   MCV 95.1 80.0 - 100.0 fL   MCH 33.0 26.0 - 34.0 pg   MCHC 34.7 30.0 - 36.0 g/dL   RDW 40.9 81.1 - 91.4 %   Platelets 310 150 - 400 K/uL   nRBC 0.2 0.0 - 0.2 %    Comment: Performed at [facility], [address] [place], [place] [address]  Comprehensive metabolic panel     Status: Abnormal   Collection Time: 11/26/22  6:20 PM  Result Value Ref Range   Sodium 135 135 - 145 mmol/L   Potassium 3.6 3.5 - 5.1 mmol/L   Chloride 102 98 - 111 mmol/L   CO2 21 (L) 22 - 32 mmol/L   Glucose, Bld 259 (H) 70 - 99 mg/dL    Comment: Glucose reference range applies only to samples taken after fasting for at least 8 hours.   BUN 11 8 - 23 mg/dL   Creatinine, Ser 6.21 (H) 0.61 - 1.24 mg/dL   Calcium 8.8 (L) 8.9 - 10.3 mg/dL   Total Protein 6.9 6.5 - 8.1 g/dL   Albumin 4.2 3.5 - 5.0 g/dL   AST 24 15 - 41 U/L   ALT 25 0 - 44 U/L   Alkaline Phosphatase 168 (H) 38 - 126 U/L   Total Bilirubin 0.9 0.3 - 1.2 mg/dL   GFR, Estimated 57 (L) >60 mL/min    Comment: (NOTE) Calculated using the CKD-EPI Creatinine Equation (2021)    Anion gap 12 5 - 15    Comment: Performed at [facility], [address] [place], [place] [address]  Lipase, blood     Status: None   Collection Time: 11/26/22  6:20 PM  Result Value Ref Range   Lipase 28 11 - 51 U/L    Comment: Performed at [facility], [address] [place], [place] [address]  Lactic acid, plasma     Status: Abnormal   Collection Time: 11/26/22  6:22 PM  Result Value Ref Range   Lactic Acid, Venous  2.8 (HH) 0.5 - 1.9 mmol/L    Comment: CRITICAL RESULT CALLED TO, READ BACK BY AND VERIFIED WITH Dahlia Bailiff RN @ [phone] 11/26/22 [facility] Performed at [facility], [address] [place], [place] [address]   Protime-INR     Status: None   Collection Time: 11/26/22  6:23 PM  Result Value Ref Range   Prothrombin Time 14.2 11.4 - 15.2 seconds   INR 1.1 0.8 - 1.2    Comment: (NOTE) INR goal varies based on device and disease states. Performed at [facility], [address] [place], [place]  [address]   Troponin I (High Sensitivity)     Status: None   Collection Time: 11/26/22  6:30 PM  Result Value Ref Range   Troponin I (High Sensitivity) 7 <18 ng/L    Comment: (NOTE) Elevated high sensitivity troponin I (hsTnI) values and significant  changes across serial measurements may suggest ACS but many other  chronic and acute conditions are known to elevate hsTnI results.  Refer to the "Links" section for chest pain algorithms and additional  guidance. Performed at [facility], [address] [place], [place] [address]   Type and screen [facility]     Status: None (Preliminary result)   Collection Time: 11/26/22  6:34 PM  Result Value Ref Range   ABO/RH(D) PENDING    Antibody Screen PENDING    Sample Expiration      11/29/2022,2359 Performed at [facility], [address] [place], [place] [address]   I-stat Creatinine, ED     Status: Abnormal   Collection Time: 11/26/22  6:49 PM  Result Value Ref Range   Creatinine, Ser 1.60 (H) 0.61 - 1.24 mg/dL    CT CHEST ABDOMEN PELVIS W CONTRAST  Result Date: 11/26/2022 CLINICAL DATA:  Polytrauma, blunt LEFT flank pain, bruising, suspect large hematoma EXAM: CT CHEST, ABDOMEN, AND PELVIS WITH CONTRAST TECHNIQUE: Multidetector CT imaging of the chest, abdomen and pelvis was performed following the standard protocol during bolus administration of intravenous contrast. RADIATION DOSE  REDUCTION: This exam was performed according to the departmental dose-optimization program which includes automated exposure control, adjustment of the mA and/or kV according to patient size and/or use of iterative reconstruction technique. CONTRAST:  OMNIPAQUE IOHEXOL 300 MG/ML  SOLN COMPARISON:  Chest CT 02/12/22 FINDINGS: CT CHEST FINDINGS Cardiovascular: No evidence of acute aortic or vascular injury. Mild aortic atherosclerosis. Noncalcified plaque in the proximal aortic branch vessels. The heart is normal in size. No pericardial effusion. Mediastinum/Nodes: No mediastinal hemorrhage or hematoma. No mediastinal adenopathy. No esophageal wall thickening. Minimal fluid in the mid esophagus. No pneumomediastinum. Lungs/Pleura: No pneumothorax. Moderate emphysema and bronchial thickening. No pulmonary contusion. No focal airspace disease. Areas of subsegmental atelectasis or scarring within the left upper and lower lobes. The ground-glass and consolidative opacities have otherwise resolved. Musculoskeletal: No acute fracture of the ribs, sternum, included clavicles or shoulder girdles. Degenerative change in the thoracic spine without acute fracture. There is diffuse sclerotic appearance of the osseous structures. CT ABDOMEN PELVIS FINDINGS Hepatobiliary: No hepatic injury or perihepatic hematoma. Subcentimeter hypodensity in the left lobe of the liver is too small to characterize but likely cyst. No further follow-up imaging is recommended. Gallbladder is unremarkable. Pancreas: No evidence of pancreatic injury. No ductal dilatation or inflammation. Spleen: Stranding in the left upper quadrant is likely related to soft tissue injury, there is no evidence of splenic laceration or injury. Adrenals/Urinary Tract: No adrenal hemorrhage or renal injury identified. Small bilateral simple appearing renal cysts. No further follow-up imaging is recommended. Bladder is unremarkable. Stomach/Bowel: No evidence of bowel  injury or mesenteric hematoma. Colonic diverticulosis without diverticulitis. Appendix is not visualized. Mild fluid-filled stomach without gastric wall thickening. Vascular/Lymphatic: Large left lateral abdominal wall/flank hematoma with small focus of active extravasation, series 2, image 72. Vascular blush persists on delayed phase. No evidence of aortic injury. There is no retroperitoneal fluid. Left retroaortic renal vein. No adenopathy. Reproductive: Prostate is unremarkable. Other: Large left abdominal wall/flank hematoma with focus of active extravasation, series 2, image 72. Active extravasation arises  just inferior to the lateral left tenth rib. This measures approximately 14.1 x 6.2 x 13.7 cm, series 2, image 84 and series 5, image 37. There is thickening of the subjacent lateral fascia. No free fluid in the abdominopelvic cavity. No free air. Musculoskeletal: No acute fracture of the pelvis or lumbar spine. L4 through S1 fusion hardware. Diffuse bony sclerosis. IMPRESSION: 1. Large left lateral abdominal wall/flank hematoma with focus of active extravasation. No adjacent rib fracture. 2. No additional acute traumatic injury to the chest, abdomen, or pelvis. 3. Diffuse sclerotic appearance of the osseous structures, a chronic finding. 4. Emphysema. 5. Colonic diverticulosis without diverticulitis. Aortic Atherosclerosis (ICD10-I70.0) and Emphysema (ICD10-J43.9). Critical Value/emergent results were called by telephone at the time of interpretation on 11/26/2022 at 7:36 pm to provider MARK QUALE , who verbally acknowledged these results. Electronically Signed   By: Narda Rutherford M.D.   On: 11/26/2022 19:41    Imaging: Personally reviewed  A/P: Henry Owens is an 62 y.o. male s/p fall Large left lateral abdominal wall/flank hematoma with active extravasation Emphysema  Hypertension CKD (baseline cr 1.4, gfr 57) Hyperglycemia  Admit Check cbc, type and screen and repeat in am Pain  control Repeat labs in am Consult neurology in am given etiology of his fall was probably one of his ongoing neuro/cardiac events Telemetry  The bleed should tamponade since within muscle. Will trend labs, if hgb drops significantly will alter treatment plan   Data reviewed: ER note, labs from August 4,Labs from June 3,  primary care office note July 19 and September 29, 2022, discussed case with Dr. Fanny Bien; neurology note 11/03/22  Mary Sella. Andrey Campanile, MD, FACS General, Bariatric, & Minimally Invasive Surgery [facility]

## 2022-11-27 ENCOUNTER — Encounter (HOSPITAL_COMMUNITY): Payer: Self-pay

## 2022-11-27 ENCOUNTER — Inpatient Hospital Stay (HOSPITAL_COMMUNITY): Payer: Medicaid Other

## 2022-11-27 ENCOUNTER — Other Ambulatory Visit: Payer: Self-pay

## 2022-11-27 ENCOUNTER — Other Ambulatory Visit (HOSPITAL_COMMUNITY): Payer: Medicaid Other

## 2022-11-27 DIAGNOSIS — R4182 Altered mental status, unspecified: Secondary | ICD-10-CM | POA: Diagnosis not present

## 2022-11-27 DIAGNOSIS — R110 Nausea: Secondary | ICD-10-CM | POA: Diagnosis not present

## 2022-11-27 DIAGNOSIS — R6889 Other general symptoms and signs: Secondary | ICD-10-CM

## 2022-11-27 DIAGNOSIS — I10 Essential (primary) hypertension: Secondary | ICD-10-CM | POA: Diagnosis not present

## 2022-11-27 DIAGNOSIS — Z8673 Personal history of transient ischemic attack (TIA), and cerebral infarction without residual deficits: Secondary | ICD-10-CM | POA: Diagnosis not present

## 2022-11-27 DIAGNOSIS — M542 Cervicalgia: Secondary | ICD-10-CM | POA: Diagnosis not present

## 2022-11-27 DIAGNOSIS — N189 Chronic kidney disease, unspecified: Secondary | ICD-10-CM | POA: Diagnosis not present

## 2022-11-27 DIAGNOSIS — R1084 Generalized abdominal pain: Secondary | ICD-10-CM | POA: Diagnosis not present

## 2022-11-27 LAB — BASIC METABOLIC PANEL
Anion gap: 13 (ref 5–15)
BUN: 14 mg/dL (ref 8–23)
CO2: 18 mmol/L — ABNORMAL LOW (ref 22–32)
Calcium: 9.5 mg/dL (ref 8.9–10.3)
Chloride: 105 mmol/L (ref 98–111)
Creatinine, Ser: 2.28 mg/dL — ABNORMAL HIGH (ref 0.61–1.24)
GFR, Estimated: 32 mL/min — ABNORMAL LOW (ref >60)
Glucose, Bld: 148 mg/dL — ABNORMAL HIGH (ref 70–99)
Potassium: 4.8 mmol/L (ref 3.5–5.1)
Sodium: 136 mmol/L (ref 135–145)

## 2022-11-27 LAB — HEMOGLOBIN A1C
Hgb A1c MFr Bld: 5.5 % (ref 4.8–5.6)
Mean Plasma Glucose: 111.15 mg/dL

## 2022-11-27 LAB — CBC
HCT: 32.7 % — ABNORMAL LOW (ref 39.0–52.0)
Hemoglobin: 11.3 g/dL — ABNORMAL LOW (ref 13.0–17.0)
MCH: 33.3 pg (ref 26.0–34.0)
MCHC: 34.6 g/dL (ref 30.0–36.0)
MCV: 96.5 fL (ref 80.0–100.0)
Platelets: 241 10*3/uL (ref 150–400)
RBC: 3.39 MIL/uL — ABNORMAL LOW (ref 4.22–5.81)
RDW: 14.6 % (ref 11.5–15.5)
WBC: 20.7 10*3/uL — ABNORMAL HIGH (ref 4.0–10.5)
nRBC: 0.1 % (ref 0.0–0.2)

## 2022-11-27 MED ORDER — FLUTICASONE PROPIONATE 50 MCG/ACT NA SUSP: 1.0000 | Freq: Every day | NASAL | Status: DC | PRN

## 2022-11-27 MED ORDER — MOMETASONE FURO-FORMOTEROL FUM 100-5 MCG/ACT IN AERO
2.0000 | INHALATION_SPRAY | Freq: Two times a day (BID) | RESPIRATORY_TRACT | Status: DC
  Administered 2022-11-27 – 2022-11-30 (×5): 2 via RESPIRATORY_TRACT
  Filled 2022-11-27: qty 8.8

## 2022-11-27 MED ORDER — DOCUSATE SODIUM 100 MG PO CAPS
100.0000 mg | ORAL_CAPSULE | Freq: Two times a day (BID) | ORAL | Status: DC
  Administered 2022-11-27 – 2022-11-29 (×5): 100 mg via ORAL
  Filled 2022-11-27 (×8): qty 1

## 2022-11-27 MED ORDER — PANTOPRAZOLE SODIUM 40 MG PO TBEC
40.0000 mg | DELAYED_RELEASE_TABLET | Freq: Every day | ORAL | Status: DC
  Administered 2022-11-27 – 2022-11-30 (×4): 40 mg via ORAL
  Filled 2022-11-27 (×4): qty 1

## 2022-11-27 MED ORDER — HYDRALAZINE HCL 20 MG/ML IJ SOLN: 10.0000 mg | INTRAMUSCULAR | Status: DC | PRN

## 2022-11-27 MED ORDER — LISINOPRIL 10 MG PO TABS: 10.0000 mg | ORAL_TABLET | Freq: Every day | ORAL | Status: DC

## 2022-11-27 MED ORDER — TIZANIDINE HCL 4 MG PO TABS
4.0000 mg | ORAL_TABLET | Freq: Three times a day (TID) | ORAL | Status: DC
  Administered 2022-11-27 – 2022-11-30 (×9): 4 mg via ORAL
  Filled 2022-11-27 (×9): qty 1

## 2022-11-27 MED ORDER — GADOBUTROL 1 MMOL/ML IV SOLN
9.0000 mL | Freq: Once | INTRAVENOUS | Status: AC | PRN
  Administered 2022-11-27: 9 mL via INTRAVENOUS

## 2022-11-27 MED ORDER — ONDANSETRON HCL 4 MG/2ML IJ SOLN
4.0000 mg | Freq: Four times a day (QID) | INTRAMUSCULAR | Status: DC | PRN
  Administered 2022-11-27 (×2): 4 mg via INTRAVENOUS
  Filled 2022-11-27 (×2): qty 2

## 2022-11-27 MED ORDER — ACETAMINOPHEN 500 MG PO TABS
1000.0000 mg | ORAL_TABLET | Freq: Four times a day (QID) | ORAL | Status: DC
  Administered 2022-11-27 – 2022-11-30 (×13): 1000 mg via ORAL
  Filled 2022-11-27 (×15): qty 2

## 2022-11-27 MED ORDER — MORPHINE SULFATE (PF) 2 MG/ML IV SOLN
1.0000 mg | INTRAVENOUS | Status: DC | PRN
  Administered 2022-11-27 – 2022-11-29 (×4): 2 mg via INTRAVENOUS
  Filled 2022-11-27 (×4): qty 1

## 2022-11-27 MED ORDER — SODIUM CHLORIDE 0.9 % IV SOLN: INTRAVENOUS | Status: DC

## 2022-11-27 MED ORDER — ALPRAZOLAM 0.5 MG PO TABS
0.2500 mg | ORAL_TABLET | Freq: Two times a day (BID) | ORAL | Status: DC | PRN
  Administered 2022-11-27 – 2022-11-28 (×2): 0.25 mg via ORAL
  Filled 2022-11-27 (×2): qty 1

## 2022-11-27 MED ORDER — METHOCARBAMOL 1000 MG/10ML IJ SOLN
500.0000 mg | Freq: Three times a day (TID) | INTRAVENOUS | Status: DC
  Administered 2022-11-27 (×2): 500 mg via INTRAVENOUS
  Filled 2022-11-27: qty 500
  Filled 2022-11-27: qty 5

## 2022-11-27 MED ORDER — METOPROLOL TARTRATE 5 MG/5ML IV SOLN: 5.0000 mg | Freq: Four times a day (QID) | INTRAVENOUS | Status: DC | PRN

## 2022-11-27 MED ORDER — ATENOLOL 25 MG PO TABS
25.0000 mg | ORAL_TABLET | Freq: Every day | ORAL | Status: DC
  Administered 2022-11-28 – 2022-11-30 (×3): 25 mg via ORAL
  Filled 2022-11-27 (×3): qty 1

## 2022-11-27 MED ORDER — POLYETHYLENE GLYCOL 3350 17 G PO PACK: 17.0000 g | PACK | Freq: Every day | ORAL | Status: DC | PRN

## 2022-11-27 MED ORDER — PROCHLORPERAZINE EDISYLATE 10 MG/2ML IJ SOLN
10.0000 mg | Freq: Four times a day (QID) | INTRAMUSCULAR | Status: DC | PRN
  Administered 2022-11-27: 10 mg via INTRAVENOUS
  Filled 2022-11-27: qty 2

## 2022-11-27 MED ORDER — AMLODIPINE BESYLATE 5 MG PO TABS
5.0000 mg | ORAL_TABLET | Freq: Every day | ORAL | Status: DC
  Administered 2022-11-28 – 2022-11-30 (×3): 5 mg via ORAL
  Filled 2022-11-27 (×4): qty 1

## 2022-11-27 MED ORDER — ONDANSETRON 4 MG PO TBDP: 4.0000 mg | ORAL_TABLET | Freq: Four times a day (QID) | ORAL | Status: DC | PRN

## 2022-11-27 MED ORDER — METHOCARBAMOL 500 MG PO TABS
500.0000 mg | ORAL_TABLET | Freq: Three times a day (TID) | ORAL | Status: DC
  Filled 2022-11-27 (×2): qty 1

## 2022-11-27 MED ORDER — OXYCODONE HCL 5 MG PO TABS
5.0000 mg | ORAL_TABLET | ORAL | Status: DC | PRN
  Administered 2022-11-27 – 2022-11-28 (×3): 5 mg via ORAL
  Filled 2022-11-27 (×3): qty 1

## 2022-11-27 MED ORDER — GABAPENTIN 300 MG PO CAPS
900.0000 mg | ORAL_CAPSULE | Freq: Two times a day (BID) | ORAL | Status: DC
  Administered 2022-11-27 – 2022-11-30 (×6): 900 mg via ORAL
  Filled 2022-11-27 (×7): qty 3

## 2022-11-27 MED ORDER — VENLAFAXINE HCL ER 75 MG PO CP24
150.0000 mg | ORAL_CAPSULE | Freq: Every day | ORAL | Status: DC
  Administered 2022-11-28 – 2022-11-30 (×3): 150 mg via ORAL
  Filled 2022-11-27 (×4): qty 2

## 2022-11-27 MED ORDER — OXYCODONE HCL 5 MG PO TABS
10.0000 mg | ORAL_TABLET | ORAL | Status: DC | PRN
  Administered 2022-11-27 – 2022-11-30 (×12): 10 mg via ORAL
  Filled 2022-11-27 (×12): qty 2

## 2022-11-27 NOTE — Plan of Care (Signed)
  Problem: Education: Goal: Knowledge of General Education information will improve Description: Including pain rating scale, medication(s)/side effects and non-pharmacologic comfort measures Outcome: Progressing   Problem: Activity: Goal: Risk for activity intolerance will decrease Outcome: Progressing   Problem: Pain Managment: Goal: General experience of comfort will improve Outcome: Progressing   

## 2022-11-27 NOTE — Progress Notes (Signed)
[facility] Progress Note     Subjective: CC-  Wife at bedside. Continues to have diffuse abdominal pain, worse on the left. Ongoing nausea and emesis. Compazine did help. Passing flatus, no BM.  Lives at home with wife Nonsmoker Denies alcohol or illicit drug use Ambulates with cane Not currently working  Objective: Vital signs in last 24 hours: Temp:  [96.4 F (35.8 C)-98 F (36.7 C)] 97.5 F (36.4 C) (08/05 1010) Pulse Rate:  [95-127] 111 (08/05 1010) Resp:  [10-21] 18 (08/05 1010) BP: (112-162)/(81-106) 143/91 (08/05 1010) SpO2:  [91 %-100 %] 94 % (08/05 1010) Weight:  [87.8 kg] 87.8 kg (08/05 0235) Last BM Date : 11/26/22  Intake/Output from previous day: 08/04 0701 - 08/05 0700 In: 497.8 [P.O.:120; I.V.:324.7; IV Piggyback:53.1] Out: -  Intake/Output this shift: Total I/O In: 243.4 [I.V.:188.4; IV Piggyback:55] Out: -   PE: Gen:  Alert, NAD, pleasant Card:  RRR Pulm:  CTAB, no W/R/R, rate and effort normal on room air Abd: somewhat firm. Mild diffuse tenderness with moderate left sided tenderness, no rebound or guarding. Evolving ecchymosis down the left abdomen into the hip Ext:  no BUE/BLE edema, calves soft and nontender Psych: A&Ox4  Skin: no rashes noted, warm and dry  Lab Results:  Recent Labs    11/26/22 2320 11/27/22 0531  WBC 25.0* 20.7*  HGB 11.9* 11.3*  HCT 36.2* 32.7*  PLT 320 241   BMET Recent Labs    11/26/22 1820 11/26/22 1849 11/27/22 0531  NA 135  --  136  K 3.6  --  4.8  CL 102  --  105  CO2 21*  --  18*  GLUCOSE 259*  --  148*  BUN 11  --  14  CREATININE 1.40* 1.60* 2.28*  CALCIUM 8.8*  --  9.5   PT/INR Recent Labs    11/26/22 1823  LABPROT 14.2  INR 1.1   CMP     Component Value Date/Time   NA 136 11/27/2022 0531   K 4.8 11/27/2022 0531   CL 105 11/27/2022 0531   CO2 18 (L) 11/27/2022 0531   GLUCOSE 148 (H) 11/27/2022 0531   BUN 14 11/27/2022 0531   CREATININE 2.28 (H) 11/27/2022 0531    CALCIUM 9.5 11/27/2022 0531   PROT 6.9 11/26/2022 1820   ALBUMIN 4.2 11/26/2022 1820   AST 24 11/26/2022 1820   ALT 25 11/26/2022 1820   ALKPHOS 168 (H) 11/26/2022 1820   BILITOT 0.9 11/26/2022 1820   GFRNONAA 32 (L) 11/27/2022 0531   Lipase     Component Value Date/Time   LIPASE 28 11/26/2022 1820       Studies/Results: CT CHEST ABDOMEN PELVIS W CONTRAST  Result Date: 11/26/2022 CLINICAL DATA:  Polytrauma, blunt LEFT flank pain, bruising, suspect large hematoma EXAM: CT CHEST, ABDOMEN, AND PELVIS WITH CONTRAST TECHNIQUE: Multidetector CT imaging of the chest, abdomen and pelvis was performed following the standard protocol during bolus administration of intravenous contrast. RADIATION DOSE REDUCTION: This exam was performed according to the departmental dose-optimization program which includes automated exposure control, adjustment of the mA and/or kV according to patient size and/or use of iterative reconstruction technique. CONTRAST:  OMNIPAQUE IOHEXOL 300 MG/ML  SOLN COMPARISON:  Chest CT 02/12/22 FINDINGS: CT CHEST FINDINGS Cardiovascular: No evidence of acute aortic or vascular injury. Mild aortic atherosclerosis. Noncalcified plaque in the proximal aortic branch vessels. The heart is normal in size. No pericardial effusion. Mediastinum/Nodes: No mediastinal hemorrhage or hematoma. No mediastinal adenopathy. No  esophageal wall thickening. Minimal fluid in the mid esophagus. No pneumomediastinum. Lungs/Pleura: No pneumothorax. Moderate emphysema and bronchial thickening. No pulmonary contusion. No focal airspace disease. Areas of subsegmental atelectasis or scarring within the left upper and lower lobes. The ground-glass and consolidative opacities have otherwise resolved. Musculoskeletal: No acute fracture of the ribs, sternum, included clavicles or shoulder girdles. Degenerative change in the thoracic spine without acute fracture. There is diffuse sclerotic appearance of the osseous  structures. CT ABDOMEN PELVIS FINDINGS Hepatobiliary: No hepatic injury or perihepatic hematoma. Subcentimeter hypodensity in the left lobe of the liver is too small to characterize but likely cyst. No further follow-up imaging is recommended. Gallbladder is unremarkable. Pancreas: No evidence of pancreatic injury. No ductal dilatation or inflammation. Spleen: Stranding in the left upper quadrant is likely related to soft tissue injury, there is no evidence of splenic laceration or injury. Adrenals/Urinary Tract: No adrenal hemorrhage or renal injury identified. Small bilateral simple appearing renal cysts. No further follow-up imaging is recommended. Bladder is unremarkable. Stomach/Bowel: No evidence of bowel injury or mesenteric hematoma. Colonic diverticulosis without diverticulitis. Appendix is not visualized. Mild fluid-filled stomach without gastric wall thickening. Vascular/Lymphatic: Large left lateral abdominal wall/flank hematoma with small focus of active extravasation, series 2, image 72. Vascular blush persists on delayed phase. No evidence of aortic injury. There is no retroperitoneal fluid. Left retroaortic renal vein. No adenopathy. Reproductive: Prostate is unremarkable. Other: Large left abdominal wall/flank hematoma with focus of active extravasation, series 2, image 72. Active extravasation arises just inferior to the lateral left tenth rib. This measures approximately 14.1 x 6.2 x 13.7 cm, series 2, image 84 and series 5, image 37. There is thickening of the subjacent lateral fascia. No free fluid in the abdominopelvic cavity. No free air. Musculoskeletal: No acute fracture of the pelvis or lumbar spine. L4 through S1 fusion hardware. Diffuse bony sclerosis. IMPRESSION: 1. Large left lateral abdominal wall/flank hematoma with focus of active extravasation. No adjacent rib fracture. 2. No additional acute traumatic injury to the chest, abdomen, or pelvis. 3. Diffuse sclerotic appearance of the  osseous structures, a chronic finding. 4. Emphysema. 5. Colonic diverticulosis without diverticulitis. Aortic Atherosclerosis (ICD10-I70.0) and Emphysema (ICD10-J43.9). Critical Value/emergent results were called by telephone at the time of interpretation on 11/26/2022 at 7:36 pm to provider MARK QUALE , who verbally acknowledged these results. Electronically Signed   By: Narda Rutherford M.D.   On: 11/26/2022 19:41    Anti-infectives: Anti-infectives (From admission, onward)    None        Assessment/Plan Henry Owens is an 62 y.o. male s/p fall Large left lateral abdominal wall/flank hematoma with active extravasation - Hgb 11.3 from 11.9, continue to trend. Abdominal binder. Ok to mobilize, PT ?Neurological event - being worked up outpatient, given this may have lead to his fall and required hospital admission will ask Neurology to see here. Continue telemetry  Emphysema - home meds Hypertension - home meds CKD (baseline cr 1.4, gfr 57) - Cr 2.28, continue IVF Hyperglycemia - check A1c  ID - none FEN - IVF, CLD VTE - SCDs only until h/h stable Foley - none  Dispo - 4NP. Therapies. Neurology consult  I reviewed last 24 h vitals and pain scores, last 48 h intake and output, last 24 h labs and trends, and last 24 h imaging results.    LOS: 1 day    Franne Forts, [facility] 11/27/2022, 11:29 AM Please see Amion for pager number during day  hours 7:00am-4:30pm

## 2022-11-27 NOTE — Progress Notes (Signed)
PT Cancellation Note  Patient Details Name: Henry Owens MRN: [id number] DOB: [birth year]   Cancelled Treatment:    Reason Eval/Treat Not Completed: Patient at procedure or test/unavailable (EEG currently and then going to MRI). Will check back as time allows.   Lyanne Co, PT  Acute Rehab Services Secure chat preferred Office [phone]    Lawana Chambers  11/27/2022, 1:14 PM

## 2022-11-27 NOTE — Progress Notes (Signed)
Patient has a STAT MRI ordered. Per, Pamalee Leyden, NP request, LTM EEG to be placed after MRI if possible. Just got off the phone with MRI to see where patient is on their list and am waiting for phone call back. Will use MRI leads if necessary.

## 2022-11-27 NOTE — Progress Notes (Signed)
LTM EEG hooked up and running - no initial skin breakdown - push button tested - [facility] monitoring.  

## 2022-11-27 NOTE — Progress Notes (Signed)
Trauma Event Note   Rounding on pt-- no complaints voiced- family at bedside states that pt has a "memory issue from hypoxia" .  Has been having syncopal type episodes "for awhile" which caused fall last week per family member.   Hematoma/ecchymosis noted to left flank area.    Last imported Vital Signs BP (!) 162/106 (BP Location: Left Arm)   Pulse (!) 112   Temp 98 F (36.7 C) (Oral)   Resp 13   Wt 193 lb 9 oz (87.8 kg)   SpO2 91%   BMI 27.77 kg/m   Trending CBC Recent Labs    11/26/22 1820 11/26/22 2320 11/27/22 0531  WBC 18.6* 25.0* 20.7*  HGB 12.9* 11.9* 11.3*  HCT 37.2* 36.2* 32.7*  PLT 310 320 241    Trending Coag's Recent Labs    11/26/22 1823  INR 1.1    Trending BMET Recent Labs    11/26/22 1820 11/26/22 1849 11/27/22 0531  NA 135  --  136  K 3.6  --  4.8  CL 102  --  105  CO2 21*  --  18*  BUN 11  --  14  CREATININE 1.40* 1.60* 2.28*  GLUCOSE 259*  --  148*       M   Trauma Response RN  Please call TRN at [phone] for further assistance.

## 2022-11-27 NOTE — TOC CAGE-AID Note (Signed)
Transition of Care [facility]) - CAGE-AID Screening   Patient Details  Name: Henry Owens MRN: [id number] Date of Birth: Mar 27, 1961  Transition of Care [facility]) CM/SW Contact:    Leota Sauers, RN Phone Number: 11/27/2022, 5:48 AM   Clinical Narrative:  Patient denies use of alcohol and illicit drugs. Education not offered at this time.  CAGE-AID Screening:    Have You Ever Felt You Ought to Cut Down on Your Drinking or Drug Use?: No Have People Annoyed You By Critizing Your Drinking Or Drug Use?: No Have You Felt Bad Or Guilty About Your Drinking Or Drug Use?: No Have You Ever Had a Drink or Used Drugs First Thing In The Morning to Steady Your Nerves or to Get Rid of a Hangover?: No CAGE-AID Score: 0  Substance Abuse Education Offered: No
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
## 2022-11-27 NOTE — ED Notes (Signed)
ED TO INPATIENT HANDOFF REPORT  ED Nurse Name and Phone #: Kathryne Hitch [phone]  S Name/Age/Gender Henry Owens 62 y.o. male Room/Bed: 024C/024C  Code Status   Code Status: Full Code  Home/SNF/Other Home Patient oriented to: self, place, time, and situation Is this baseline? Yes   Triage Complete: Triage complete  Chief Complaint Fall [W19.XXXA]  Triage Note Patient arrives from [facility] via [facility] for abdominal pain and flank pain on the let side. Patient presents clammy, pale, with hematoma to the left flank.    Allergies Allergies  Allergen Reactions   Wound Dressing Adhesive Other (See Comments)    BLISTER   Azithromycin Rash   Penicillin G Rash    Has patient had a PCN reaction causing immediate rash, facial/tongue/throat swelling, SOB or lightheadedness with hypotension: Yes Has patient had a PCN reaction causing severe rash involving mucus membranes or skin necrosis: No Has patient had a PCN reaction that required hospitalization: No Has patient had a PCN reaction occurring within the last 10 years: No If all of the above answers are "NO", then may proceed with Cephalosporin use.     Level of Care/Admitting Diagnosis ED Disposition     ED Disposition  Admit   Condition  --   Comment  Hospital Area: [facility] [[id number]]  Level of Care: Progressive [102]  Admit to Progressive based on following criteria: MULTISYSTEM THREATS such as stable sepsis, metabolic/electrolyte imbalance with or without encephalopathy that is responding to early treatment.  May admit patient to [facility] or [facility] if equivalent level of care is available:: No  Covid Evaluation: Asymptomatic - no recent exposure (last 10 days) testing not required  Diagnosis: Fall [[id number]]  Admitting Physician: TRAUMA MD [[id number]]  Attending Physician: TRAUMA MD [[id number]]  Certification:: I certify this patient will need inpatient services for at least 2 midnights  Estimated  Length of Stay: 2          B Medical/Surgery History Past Medical History:  Diagnosis Date   Anxiety    Arthritis    Flu 07/2017   Past Surgical History:  Procedure Laterality Date   APPENDECTOMY  1975   BACK SURGERY     KNEE ARTHROSCOPY WITH MEDIAL MENISECTOMY Right 08/20/2017   Procedure: KNEE ARTHROSCOPY WITH PARTIAL MEDIAL AND LATERAL MENISECTOMY,PARTIAL SYNOVECTOMY,CHONDROPLASTY, LOOSE BODY REMOVAL;  Surgeon: Lyndle Herrlich, MD;  Location: [facility] ORS;  Service: Orthopedics;  Laterality: Right;   KNEE SURGERY  1975   1996, 2000     A IV Location/Drains/Wounds Patient Lines/Drains/Airways Status     Active Line/Drains/Airways     Name Placement date Placement time Site Days   Peripheral IV 11/26/22 20 G Right Antecubital 11/26/22  1832  Antecubital  1   Peripheral IV 11/26/22 20 G Left Antecubital 11/26/22  1813  Antecubital  1            Intake/Output Last 24 hours No intake or output data in the 24 hours ending 11/27/22 0130  Labs/Imaging Results for orders placed or performed during the hospital encounter of 11/26/22 (from the past 48 hour(s))  CBC     Status: Abnormal   Collection Time: 11/26/22 11:20 PM  Result Value Ref Range   WBC 25.0 (H) 4.0 - 10.5 K/uL   RBC 3.73 (L) 4.22 - 5.81 MIL/uL   Hemoglobin 11.9 (L) 13.0 - 17.0 g/dL   HCT 45.4 (L) 09.8 - 11.9 %   MCV 97.1 80.0 - 100.0 fL   MCH 31.9  26.0 - 34.0 pg   MCHC 32.9 30.0 - 36.0 g/dL   RDW 95.6 21.3 - 08.6 %   Platelets 320 150 - 400 K/uL   nRBC 0.3 (H) 0.0 - 0.2 %    Comment: Performed at [facility], [address], [place], [place] [address]  Type and screen [facility]     Status: None   Collection Time: 11/26/22 11:23 PM  Result Value Ref Range   ABO/RH(D) O POS    Antibody Screen NEG    Sample Expiration      11/29/2022,2359 Performed at [facility], [address] [place], [place] [address]    CT CHEST ABDOMEN PELVIS W CONTRAST  Result Date:  11/26/2022 CLINICAL DATA:  Polytrauma, blunt LEFT flank pain, bruising, suspect large hematoma EXAM: CT CHEST, ABDOMEN, AND PELVIS WITH CONTRAST TECHNIQUE: Multidetector CT imaging of the chest, abdomen and pelvis was performed following the standard protocol during bolus administration of intravenous contrast. RADIATION DOSE REDUCTION: This exam was performed according to the departmental dose-optimization program which includes automated exposure control, adjustment of the mA and/or kV according to patient size and/or use of iterative reconstruction technique. CONTRAST:  OMNIPAQUE IOHEXOL 300 MG/ML  SOLN COMPARISON:  Chest CT 02/12/22 FINDINGS: CT CHEST FINDINGS Cardiovascular: No evidence of acute aortic or vascular injury. Mild aortic atherosclerosis. Noncalcified plaque in the proximal aortic branch vessels. The heart is normal in size. No pericardial effusion. Mediastinum/Nodes: No mediastinal hemorrhage or hematoma. No mediastinal adenopathy. No esophageal wall thickening. Minimal fluid in the mid esophagus. No pneumomediastinum. Lungs/Pleura: No pneumothorax. Moderate emphysema and bronchial thickening. No pulmonary contusion. No focal airspace disease. Areas of subsegmental atelectasis or scarring within the left upper and lower lobes. The ground-glass and consolidative opacities have otherwise resolved. Musculoskeletal: No acute fracture of the ribs, sternum, included clavicles or shoulder girdles. Degenerative change in the thoracic spine without acute fracture. There is diffuse sclerotic appearance of the osseous structures. CT ABDOMEN PELVIS FINDINGS Hepatobiliary: No hepatic injury or perihepatic hematoma. Subcentimeter hypodensity in the left lobe of the liver is too small to characterize but likely cyst. No further follow-up imaging is recommended. Gallbladder is unremarkable. Pancreas: No evidence of pancreatic injury. No ductal dilatation or inflammation. Spleen: Stranding in the left upper  quadrant is likely related to soft tissue injury, there is no evidence of splenic laceration or injury. Adrenals/Urinary Tract: No adrenal hemorrhage or renal injury identified. Small bilateral simple appearing renal cysts. No further follow-up imaging is recommended. Bladder is unremarkable. Stomach/Bowel: No evidence of bowel injury or mesenteric hematoma. Colonic diverticulosis without diverticulitis. Appendix is not visualized. Mild fluid-filled stomach without gastric wall thickening. Vascular/Lymphatic: Large left lateral abdominal wall/flank hematoma with small focus of active extravasation, series 2, image 72. Vascular blush persists on delayed phase. No evidence of aortic injury. There is no retroperitoneal fluid. Left retroaortic renal vein. No adenopathy. Reproductive: Prostate is unremarkable. Other: Large left abdominal wall/flank hematoma with focus of active extravasation, series 2, image 72. Active extravasation arises just inferior to the lateral left tenth rib. This measures approximately 14.1 x 6.2 x 13.7 cm, series 2, image 84 and series 5, image 37. There is thickening of the subjacent lateral fascia. No free fluid in the abdominopelvic cavity. No free air. Musculoskeletal: No acute fracture of the pelvis or lumbar spine. L4 through S1 fusion hardware. Diffuse bony sclerosis. IMPRESSION: 1. Large left lateral abdominal wall/flank hematoma with focus of active extravasation. No adjacent rib fracture.  2. No additional acute traumatic injury to the chest, abdomen, or pelvis. 3. Diffuse sclerotic appearance of the osseous structures, a chronic finding. 4. Emphysema. 5. Colonic diverticulosis without diverticulitis. Aortic Atherosclerosis (ICD10-I70.0) and Emphysema (ICD10-J43.9). Critical Value/emergent results were called by telephone at the time of interpretation on 11/26/2022 at 7:36 pm to provider MARK QUALE , who verbally acknowledged these results. Electronically Signed   By: Narda Rutherford  M.D.   On: 11/26/2022 19:41    Pending Labs Unresulted Labs (From admission, onward)     Start     Ordered   11/27/22 0500  CBC  Tomorrow morning,   R        11/27/22 0003   11/27/22 0500  Basic metabolic panel  Tomorrow morning,   R        11/27/22 0003            Vitals/Pain Today's Vitals   11/27/22 0003 11/27/22 0013 11/27/22 0100 11/27/22 0106  BP: 122/82  (!) 123/97   Pulse: (!) 127 (!) 106 (!) 101   Resp: (!) 21 20 13    Temp:      TempSrc:      SpO2: 97% 100% 96%   PainSc:    7     Isolation Precautions No active isolations  Medications Medications  acetaminophen (TYLENOL) tablet 1,000 mg (1,000 mg Oral Given 11/27/22 0011)  methocarbamol (ROBAXIN) tablet 500 mg ( Oral See Alternative 11/27/22 0104)    Or  methocarbamol (ROBAXIN) 500 mg in dextrose 5 % 50 mL IVPB (500 mg Intravenous New Bag/Given 11/27/22 0104)  docusate sodium (COLACE) capsule 100 mg (100 mg Oral Not Given 11/27/22 0105)  polyethylene glycol (MIRALAX / GLYCOLAX) packet 17 g (has no administration in time range)  ondansetron (ZOFRAN-ODT) disintegrating tablet 4 mg (has no administration in time range)    Or  ondansetron (ZOFRAN) injection 4 mg (has no administration in time range)  metoprolol tartrate (LOPRESSOR) injection 5 mg (has no administration in time range)  hydrALAZINE (APRESOLINE) injection 10 mg (has no administration in time range)  0.9 %  sodium chloride infusion ( Intravenous New Bag/Given 11/27/22 0010)  oxyCODONE (Oxy IR/ROXICODONE) immediate release tablet 5 mg (has no administration in time range)  oxyCODONE (Oxy IR/ROXICODONE) immediate release tablet 10 mg (has no administration in time range)  morphine (PF) 2 MG/ML injection 1-2 mg (has no administration in time range)  HYDROmorphone (DILAUDID) injection 0.5 mg (0.5 mg Intravenous Given 11/27/22 0010)  ondansetron (ZOFRAN) injection 4 mg (4 mg Intravenous Given 11/27/22 0011)    Mobility walks     Focused Assessments      R Recommendations: See Admitting Provider Note  Report given to:   Additional Notes:

## 2022-11-27 NOTE — ED Notes (Signed)
EMTALA CHECK: MEDICAL NECESSITY COMPLETED, EMTALA COMPLETED WITH EDP REASSESSMENT, TRANSPORT SIGNATURE COMPLETED, VS COMPLETED WITHIN REQUIRED TRANSFER TIME

## 2022-11-27 NOTE — ED Notes (Signed)
Pharmacy at bedside

## 2022-11-28 DIAGNOSIS — R4182 Altered mental status, unspecified: Secondary | ICD-10-CM | POA: Diagnosis not present

## 2022-11-28 DIAGNOSIS — I10 Essential (primary) hypertension: Secondary | ICD-10-CM | POA: Diagnosis not present

## 2022-11-28 DIAGNOSIS — R569 Unspecified convulsions: Secondary | ICD-10-CM | POA: Diagnosis not present

## 2022-11-28 DIAGNOSIS — R110 Nausea: Secondary | ICD-10-CM | POA: Diagnosis not present

## 2022-11-28 DIAGNOSIS — R1084 Generalized abdominal pain: Secondary | ICD-10-CM | POA: Diagnosis not present

## 2022-11-28 DIAGNOSIS — N189 Chronic kidney disease, unspecified: Secondary | ICD-10-CM | POA: Diagnosis not present

## 2022-11-28 LAB — CBC
HCT: 22.3 % — ABNORMAL LOW (ref 39.0–52.0)
HCT: 24.1 % — ABNORMAL LOW (ref 39.0–52.0)
Hemoglobin: 7.6 g/dL — ABNORMAL LOW (ref 13.0–17.0)
Hemoglobin: 8.2 g/dL — ABNORMAL LOW (ref 13.0–17.0)
MCH: 32.3 pg (ref 26.0–34.0)
MCH: 32.2 pg (ref 26.0–34.0)
MCHC: 34.1 g/dL (ref 30.0–36.0)
MCHC: 34 g/dL (ref 30.0–36.0)
MCV: 94.9 fL (ref 80.0–100.0)
MCV: 94.5 fL (ref 80.0–100.0)
Platelets: 134 10*3/uL — ABNORMAL LOW (ref 150–400)
Platelets: 110 10*3/uL — ABNORMAL LOW (ref 150–400)
RBC: 2.35 MIL/uL — ABNORMAL LOW (ref 4.22–5.81)
RBC: 2.55 MIL/uL — ABNORMAL LOW (ref 4.22–5.81)
RDW: 14.7 % (ref 11.5–15.5)
RDW: 14.6 % (ref 11.5–15.5)
WBC: 9.4 10*3/uL (ref 4.0–10.5)
WBC: 6.2 10*3/uL (ref 4.0–10.5)
nRBC: 0.2 % (ref 0.0–0.2)
nRBC: 0 % (ref 0.0–0.2)

## 2022-11-28 LAB — LDL CHOLESTEROL, DIRECT: Direct LDL: 55 mg/dL (ref 0–99)

## 2022-11-28 LAB — PREPARE RBC (CROSSMATCH)

## 2022-11-28 MED ORDER — ALPRAZOLAM 0.5 MG PO TABS
0.2500 mg | ORAL_TABLET | Freq: Two times a day (BID) | ORAL | Status: DC
  Administered 2022-11-28 – 2022-11-30 (×4): 0.25 mg via ORAL
  Filled 2022-11-28 (×4): qty 1

## 2022-11-28 MED ORDER — SODIUM CHLORIDE 0.9% IV SOLUTION: Freq: Once | INTRAVENOUS | Status: AC
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
## 2022-11-28 NOTE — Progress Notes (Signed)
Patient ID: Henry Owens, male   DOB: April 29, 1960, 62 y.o.   MRN: [id number]      Subjective: Pain in hematoma ROS negative except as listed above. Objective: Vital signs in last 24 hours: Temp:  [97.7 F (36.5 C)-98.6 F (37 C)] 98.6 F (37 C) (08/06 1453) Pulse Rate:  [68-89] 80 (08/06 1453) Resp:  [11-20] 14 (08/06 1453) BP: (100-148)/(63-91) 124/63 (08/06 1453) SpO2:  [92 %-98 %] 98 % (08/06 1453) Last BM Date : 11/26/22  Intake/Output from previous day: 08/05 0701 - 08/06 0700 In: 1090.3 [P.O.:300; I.V.:735.3; IV Piggyback:55] Out: 300 [Urine:300] Intake/Output this shift: Total I/O In: 520 [P.O.:240; Blood:280] Out: 900 [Urine:900]  General appearance: alert and cooperative Resp: clear to auscultation bilaterally GI: soft, non-tender; bowel sounds normal; no masses,  no organomegaly Large L flank hematoma tender, binder replaced Cont EEG on  Lab Results: CBC  Recent Labs    11/28/22 0518 11/28/22 1448  WBC 9.4 6.2  HGB 7.6* 8.2*  HCT 22.3* 24.1*  PLT 134* 110*   BMET Recent Labs    11/27/22 0531 11/28/22 0214  NA 136 130*  K 4.8 3.6  CL 105 101  CO2 18* 21*  GLUCOSE 148* 106*  BUN 14 17  CREATININE 2.28* 1.97*  CALCIUM 9.5 8.2*   PT/INR Recent Labs    11/26/22 1823  LABPROT 14.2  INR 1.1   ABG No results for input(s): "PHART", "HCO3" in the last 72 hours.  Invalid input(s): "PCO2", "PO2"  Studies/Results: MR BRAIN W WO CONTRAST  Result Date: 11/27/2022 CLINICAL DATA:  Altered mental status EXAM: MRI HEAD WITHOUT AND WITH CONTRAST TECHNIQUE: Multiplanar, multiecho pulse sequences of the brain and surrounding structures were obtained without and with intravenous contrast. CONTRAST:  9mL GADAVIST GADOBUTROL 1 MMOL/ML IV SOLN COMPARISON:  None Available. FINDINGS: Brain: No acute infarct, mass effect or extra-axial collection. No acute or chronic hemorrhage. There is multifocal hyperintense T2-weighted signal within the white matter.  Generalized volume loss. 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 The midline structures are normal. There is no abnormal contrast enhancement. Vascular: Major flow voids are preserved. Skull and upper cervical spine: Normal calvarium and skull base. Visualized upper cervical spine and soft tissues are normal. Sinuses/Orbits:No paranasal sinus fluid levels or advanced mucosal thickening. No mastoid or middle ear effusion. Normal orbits. IMPRESSION: 1. No acute intracranial abnormality. 2. 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 Electronically Signed   By: Deatra Robinson M.D.   On: 11/27/2022 19:21   MR CERVICAL SPINE WO CONTRAST  Result Date: 11/27/2022 CLINICAL DATA:  Neck pain, chronic.  Recent fall. EXAM: MRI CERVICAL SPINE WITHOUT CONTRAST TECHNIQUE: Multiplanar, multisequence MR imaging of the cervical spine was performed. No intravenous contrast was administered. COMPARISON:  Cervical spine CT 11/10/2021 FINDINGS: The study is mildly to moderately motion degraded. Alignment: Normal. Vertebrae: Nonspecific diffusely diminished bone marrow T1 signal intensity corresponding to previously described diffuse sclerosis on CT. Preserved vertebral body heights. Bulky, solid bridging anterior vertebral ossification from C4-C6. STIR hyperintensity/fluid signal in the C5-6 disc without other findings to strongly suggest acute cervical spine injury. Cord: Normal signal. Posterior Fossa, vertebral arteries, paraspinal tissues: No significant paraspinal soft tissue edema or prevertebral fluid. Posterior fossa more fully evaluated on the contemporaneous head MRI. Preserved vertebral artery flow voids. Disc levels: Moderate vertebral spurring and mild left facet arthrosis result in mild left neural foraminal stenosis without spinal stenosis. C3-4: A T2 hyperintense central  disc protrusion results in mild spinal stenosis with minimal ventral cord flattening. Asymmetric  right uncovertebral spurring and severe right facet arthrosis result in severe right neural foraminal stenosis. C4-5: A small central disc protrusion results in mild spinal stenosis. Patent neural foramina. C5-6: A broad-based posterior disc osteophyte complex and prominent bilateral uncovertebral spurring result in mild spinal stenosis and severe bilateral neural foraminal stenosis. C6-7: Severe disc space narrowing. Disc bulging and left greater than right uncovertebral spurring result in mild-to-moderate spinal stenosis and mild-to-moderate right and severe left neural foraminal stenosis. C7-T1: Negative. IMPRESSION: 1. Multilevel cervical disc degeneration, worst at C6-7 where there is mild-to-moderate spinal stenosis and severe left neural foraminal stenosis. 2. Mild spinal stenosis and severe bilateral neural foraminal stenosis at C5-6. 3. Mild spinal stenosis and severe right neural foraminal stenosis at C3-4. Electronically Signed   By: Sebastian Ache M.D.   On: 11/27/2022 15:17   CT CHEST ABDOMEN PELVIS W CONTRAST  Result Date: 11/26/2022 CLINICAL DATA:  Polytrauma, blunt LEFT flank pain, bruising, suspect large hematoma EXAM: CT CHEST, ABDOMEN, AND PELVIS WITH CONTRAST TECHNIQUE: Multidetector CT imaging of the chest, abdomen and pelvis was performed following the standard protocol during bolus administration of intravenous contrast. RADIATION DOSE REDUCTION: This exam was performed according to the departmental dose-optimization program which includes automated exposure control, adjustment of the mA and/or kV according to patient size and/or use of iterative reconstruction technique. CONTRAST:  OMNIPAQUE IOHEXOL 300 MG/ML  SOLN COMPARISON:  Chest CT 02/12/22 FINDINGS: CT CHEST FINDINGS Cardiovascular: No evidence of acute aortic or vascular injury. Mild aortic atherosclerosis. Noncalcified plaque in the proximal aortic branch vessels. The heart is normal in size. No pericardial effusion.  Mediastinum/Nodes: No mediastinal hemorrhage or hematoma. No mediastinal adenopathy. No esophageal wall thickening. Minimal fluid in the mid esophagus. No pneumomediastinum. Lungs/Pleura: No pneumothorax. Moderate emphysema and bronchial thickening. No pulmonary contusion. No focal airspace disease. Areas of subsegmental atelectasis or scarring within the left upper and lower lobes. The ground-glass and consolidative opacities have otherwise resolved. Musculoskeletal: No acute fracture of the ribs, sternum, included clavicles or shoulder girdles. Degenerative change in the thoracic spine without acute fracture. There is diffuse sclerotic appearance of the osseous structures. CT ABDOMEN PELVIS FINDINGS Hepatobiliary: No hepatic injury or perihepatic hematoma. Subcentimeter hypodensity in the left lobe of the liver is too small to characterize but likely cyst. No further follow-up imaging is recommended. Gallbladder is unremarkable. Pancreas: No evidence of pancreatic injury. No ductal dilatation or inflammation. Spleen: Stranding in the left upper quadrant is likely related to soft tissue injury, there is no evidence of splenic laceration or injury. Adrenals/Urinary Tract: No adrenal hemorrhage or renal injury identified. Small bilateral simple appearing renal cysts. No further follow-up imaging is recommended. Bladder is unremarkable. Stomach/Bowel: No evidence of bowel injury or mesenteric hematoma. Colonic diverticulosis without diverticulitis. Appendix is not visualized. Mild fluid-filled stomach without gastric wall thickening. Vascular/Lymphatic: Large left lateral abdominal wall/flank hematoma with small focus of active extravasation, series 2, image 72. Vascular blush persists on delayed phase. No evidence of aortic injury. There is no retroperitoneal fluid. Left retroaortic renal vein. No adenopathy. Reproductive: Prostate is unremarkable. Other: Large left abdominal wall/flank hematoma with focus of active  extravasation, series 2, image 72. Active extravasation arises just inferior to the lateral left tenth rib. This measures approximately 14.1 x 6.2 x 13.7 cm, series 2, image 84 and series 5, image 37. There is thickening of the subjacent lateral fascia. No free fluid in the abdominopelvic  cavity. No free air. Musculoskeletal: No acute fracture of the pelvis or lumbar spine. L4 through S1 fusion hardware. Diffuse bony sclerosis. IMPRESSION: 1. Large left lateral abdominal wall/flank hematoma with focus of active extravasation. No adjacent rib fracture. 2. No additional acute traumatic injury to the chest, abdomen, or pelvis. 3. Diffuse sclerotic appearance of the osseous structures, a chronic finding. 4. Emphysema. 5. Colonic diverticulosis without diverticulitis. Aortic Atherosclerosis (ICD10-I70.0) and Emphysema (ICD10-J43.9). Critical Value/emergent results were called by telephone at the time of interpretation on 11/26/2022 at 7:36 pm to provider MARK QUALE , who verbally acknowledged these results. Electronically Signed   By: Narda Rutherford M.D.   On: 11/26/2022 19:41    Anti-infectives: Anti-infectives (From admission, onward)    None       Assessment/Plan: Ire Booth is an 62 y.o. male s/p fall Large left lateral abdominal wall/flank hematoma with active extravasation - Hb 7.6. 1u PRBC now 8.2 ?Neurological event - being worked up outpatient, given this may have lead to his fall and required hospital admission will ask Neurology to see here. Continue telemetry  Emphysema - home meds Hypertension - home meds CKD (baseline cr 1.4, gfr 57) - Cr down to 1.97 Hyperglycemia - check A1c  ID - none FEN - IVF, CLD VTE - SCDs only until h/h stable Foley - none  Dispo - 4NP. Therapies. Neurology W/U (cont EEG on) I spoke with his wife at the bedside  LOS: 2 days    Violeta Gelinas, MD, MPH, FACS Trauma & General Surgery Use [email or web address] to contact on call provider  11/28/2022

## 2022-11-28 NOTE — Procedures (Signed)
Patient Name: Henry Owens MRN: [id number] Epilepsy Attending: Bing Neighbors MD Referring Physician/Provider: Ritta Slot MD Duration: 11/27/22 1307 to 11/28/22 0730   Patient history:  This is a 62 yo patient with spells who is undergoing EEG to evaluate for seizures.   AEDs during EEG study: alprazolam, gabapentin   Technical aspects: This EEG study was done with scalp electrodes positioned according to the 10-20 International system of electrode placement. Electrical activity was reviewed with band pass filter of 1-70Hz , sensitivity of 7 uV/mm, display speed of 41mm/sec with a 60Hz  notched filter applied as appropriate. EEG data were recorded continuously and digitally stored.  Video monitoring was available and reviewed as appropriate.   Description: The occipital dominant rhythm was 6-7 Hz. This activity is reactive to stimulation. Drowsiness was manifested by background fragmentation; deeper stages of sleep were identified by K complexes and sleep spindles. There was no focal slowing. There were no interictal epileptiform discharges. There were no electrographic seizures identified. Photic stimulation and hyperventilation were not performed.   Event button was pushed 11/27/22 at 2120 for patient having "word salad." Concomitant eeg before, during and after the event didn't show any EEG change to suggest seizure.     ABNORMALITY -- Moderate diffuse slowing   IMPRESSION: This study is suggestive of moderate diffuse encephalopathy, nonspecific etiology. No seizures or epileptiform discharges were seen throughout the recording.   Event button was pushed 11/27/22 at 2120 for patient having "word salad." Concomitant eeg before, during and after the event didn't show any EEG change to suggest seizure.  Bing Neighbors, MD [facility] [phone]  If 7pm- 7am, please page neurology on call as listed in AMION.

## 2022-11-28 NOTE — Progress Notes (Signed)
Repeated CBC = Hgb 7.6 HRCT 22.3 platelets 134. The new draw is only mildly improved- still significant drop from yesterday- Pt remains asymptomatic at current. DR Scherry Ran paged and Smart text notified

## 2022-11-28 NOTE — Evaluation (Signed)
Physical Therapy Evaluation Patient Details Name: Henry Owens MRN: [id number] DOB: 1960-08-24 Today's Date: 11/28/2022  History of Present Illness  Henry Owens is a 62yoM who presents on 11/26/22 from [facility] with a fall followed by a sneeze that caused L flank hematoma. He is currently being worked up as outpt by Chief Financial Officer. He has had multiple neuro events and tremors in both hands. 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 Currently having prolonged EEG. PMH: arthritis, anxiety, multiple knee surgeries, back surgery, HTN, emphysema, CKD, hyperglycemia, memory issues  Clinical Impression  Pt admitted with above diagnosis. Pt from home with wife where he uses a cane for ambulation and gets a w/c when out at [employer] and has longer distances. Has h/o chronic pain from old back and knee injuries/ surgeries. Pt with L flank pain and ecchymosis, noted to have somewhat shallow breathing so given IS for feeback with bretahing depth. Pt familiar with use from h/o PNA. Wife reports numerous episodes of pt "blanking out" for a few seconds to minutes, pt calls them his "blinky episodes". This did not occur on eval. Pt mobilizing at min A level on eval. Could not progress ambulation due to continuous EEG monitoring. Will follow acutely but do not anticipate him needing PT upon d/c.  Pt currently with functional limitations due to the deficits listed below (see PT Problem List). Pt will benefit from acute skilled PT to increase their independence and safety with mobility to allow discharge.           If plan is discharge home, recommend the following: A little help with walking and/or transfers;A little help with bathing/dressing/bathroom;Assistance with cooking/housework;Help with stairs or ramp for entrance;Assist for transportation   Can travel by private vehicle        Equipment Recommendations None recommended by PT  Recommendations for Other Services  OT  consult    Functional Status Assessment Patient has had a recent decline in their functional status and demonstrates the ability to make significant improvements in function in a reasonable and predictable amount of time.     Precautions / Restrictions Precautions Precautions: Fall Precaution Comments: had some sort of neuro event and fell out of bed. Got self back into bed and did not tell wife until next day Restrictions Weight Bearing Restrictions: No      Mobility  Bed Mobility Overal bed mobility: Needs Assistance Bed Mobility: Supine to Sit, Sit to Supine     Supine to sit: Min assist Sit to supine: Min guard   General bed mobility comments: pt with increased pain with bed mobility but is performing with minimal assistance.    Transfers Overall transfer level: Needs assistance   Transfers: Sit to/from Stand Sit to Stand: Min guard           General transfer comment: min guard for safety. Pt denies dizziness. Nausea that was present yesterday has not returned.    Ambulation/Gait               General Gait Details: unable today due to EEG. Able to stand and march in place x1 min without LOB  Stairs            Wheelchair Mobility     Tilt Bed    Modified Rankin (Stroke Patients Only)       Balance Overall balance assessment: History of Falls, Needs assistance Sitting-balance support: No upper extremity supported, Feet supported Sitting balance-Leahy Scale:  Good     Standing balance support: No upper extremity supported, During functional activity Standing balance-Leahy Scale: Fair Standing balance comment: pt able to maintain dynamic balance with supervision. Reports balance is mostly off when he has an episode. Mild chronic balance impairment from old back and knee injuries, this is stable                             Pertinent Vitals/Pain Pain Assessment Pain Assessment: Faces Faces Pain Scale: Hurts worst Pain Location:  L flank Pain Descriptors / Indicators: Constant, Tender Pain Intervention(s): Limited activity within patient's tolerance, Monitored during session, Premedicated before session    Home Living Family/patient expects to be discharged to:: Private residence Living Arrangements: Spouse/significant other Available Help at Discharge: Family;Available 24 hours/day Type of Home: House Home Access: Stairs to enter Entrance Stairs-Rails: None Entrance Stairs-Number of Steps: 2   Home Layout: One level Home Equipment: Cane - single point;Educational psychologist (2 wheels)      Prior Function Prior Level of Function : Independent/Modified Independent             Mobility Comments: uses SPC for most ambulation, w/c for appts ADLs Comments: min guard from wife, needs assist donning socks     Hand Dominance   Dominant Hand: Right    Extremity/Trunk Assessment   Upper Extremity Assessment Upper Extremity Assessment: Overall WFL for tasks assessed    Lower Extremity Assessment Lower Extremity Assessment: Overall WFL for tasks assessed    Cervical / Trunk Assessment Cervical / Trunk Assessment: Normal  Communication   Communication: HOH  Cognition Arousal/Alertness: Awake/alert Behavior During Therapy: WFL for tasks assessed/performed Overall Cognitive Status: History of cognitive impairments - at baseline                                 General Comments: STM deficits at baseline. Pt able to give accurate history and cognition in tact for general conversation        General Comments General comments (skin integrity, edema, etc.): Pt alert and oriented throughout eval. VSS on RA. Wife present. Pt had PNA for prolonged time last year and noted pt to have shallow breathing with L flank pain. Pt given IS and practiced using. Able to inhale to 2250    Exercises     Assessment/Plan    PT Assessment Patient needs continued PT services  PT Problem List  Decreased activity tolerance;Decreased balance;Decreased mobility;Decreased knowledge of precautions;Pain       PT Treatment Interventions DME instruction;Gait training;Stair training;Functional mobility training;Therapeutic activities;Therapeutic exercise;Balance training;Neuromuscular re-education;Cognitive remediation;Patient/family education    PT Goals (Current goals can be found in the Care Plan section)  Acute Rehab PT Goals Patient Stated Goal: return home PT Goal Formulation: With patient Time For Goal Achievement: 12/12/22 Potential to Achieve Goals: Good    Frequency Min 1X/week     Co-evaluation               AM-PAC PT "6 Clicks" Mobility  Outcome Measure Help needed turning from your back to your side while in a flat bed without using bedrails?: A Little Help needed moving from lying on your back to sitting on the side of a flat bed without using bedrails?: A Little Help needed moving to and from a bed to a chair (including a wheelchair)?: A Little Help needed standing up from a chair using  your arms (e.g., wheelchair or bedside chair)?: A Little Help needed to walk in hospital room?: A Little Help needed climbing 3-5 steps with a railing? : A Little 6 Click Score: 18    End of Session   Activity Tolerance: Other (comment) (limited by EEG) Patient left: in bed;with call bell/phone within reach;with bed alarm set;with family/visitor present Nurse Communication: Mobility status PT Visit Diagnosis: Unsteadiness on feet (R26.81);History of falling (Z91.81);Pain;Repeated falls (R29.6) Pain - Right/Left: Left Pain - part of body:  (flank)    Time: 2725-3664 PT Time Calculation (min) (ACUTE ONLY): 30 min   Charges:   PT Evaluation $PT Eval Moderate Complexity: 1 Mod PT Treatments $Therapeutic Activity: 8-22 mins PT General Charges $$ ACUTE PT VISIT: 1 Visit         Lyanne Co, PT  Acute Rehab Services Secure chat preferred Office  [phone]   Lawana Chambers  11/28/2022, 2:13 PM

## 2022-11-29 DIAGNOSIS — S301XXA Contusion of abdominal wall, initial encounter: Secondary | ICD-10-CM | POA: Diagnosis not present

## 2022-11-29 DIAGNOSIS — R1084 Generalized abdominal pain: Secondary | ICD-10-CM | POA: Diagnosis not present

## 2022-11-29 DIAGNOSIS — R110 Nausea: Secondary | ICD-10-CM | POA: Diagnosis not present

## 2022-11-29 DIAGNOSIS — N189 Chronic kidney disease, unspecified: Secondary | ICD-10-CM | POA: Diagnosis not present

## 2022-11-29 DIAGNOSIS — I10 Essential (primary) hypertension: Secondary | ICD-10-CM | POA: Diagnosis not present

## 2022-11-29 DIAGNOSIS — R569 Unspecified convulsions: Secondary | ICD-10-CM | POA: Diagnosis not present

## 2022-11-29 MED ORDER — ENOXAPARIN SODIUM 30 MG/0.3ML IJ SOSY
30.0000 mg | PREFILLED_SYRINGE | Freq: Two times a day (BID) | INTRAMUSCULAR | Status: DC
  Administered 2022-11-29 – 2022-11-30 (×2): 30 mg via SUBCUTANEOUS
  Filled 2022-11-29 (×2): qty 0.3

## 2022-11-29 MED ORDER — MAGNESIUM CITRATE PO SOLN
1.0000 | Freq: Once | ORAL | Status: AC
  Administered 2022-11-29: 1 via ORAL
  Filled 2022-11-29: qty 296

## 2022-11-29 MED ORDER — MORPHINE SULFATE (PF) 2 MG/ML IV SOLN
1.0000 mg | INTRAVENOUS | Status: DC | PRN
  Administered 2022-11-29 – 2022-11-30 (×3): 2 mg via INTRAVENOUS
  Filled 2022-11-29 (×3): qty 1

## 2022-11-29 NOTE — Progress Notes (Signed)
LTM EEG discontinued - no skin breakdown at unhook.   

## 2022-11-29 NOTE — Plan of Care (Signed)
  Problem: Education: Goal: Knowledge of General Education information will improve Description: Including pain rating scale, medication(s)/side effects and non-pharmacologic comfort measures Outcome: Progressing   Problem: Clinical Measurements: Goal: Diagnostic test results will improve Outcome: Progressing   Problem: Pain Managment: Goal: General experience of comfort will improve Outcome: Progressing

## 2022-11-29 NOTE — Progress Notes (Signed)
PT Cancellation Note  Patient Details Name: Henry Owens MRN: [id number] DOB: 1961-04-20   Cancelled Treatment:    Reason Eval/Treat Not Completed: Other (comment) Pt politely declining due to increased R flank pain; RN aware.  Lillia Pauls, PT, DPT Acute Rehabilitation Services Office [phone]    Norval Morton 11/29/2022, 12:42 PM

## 2022-11-29 NOTE — Procedures (Signed)
Patient Name: Henry Owens  MRN: [id number]  Epilepsy Attending: Charlsie Quest  Referring Physician/Provider:  Ritta Slot MD  Duration: 10/28/2022 0730 to 11/29/2022 0730  Patient history: 62 yo patient with spells who is undergoing EEG to evaluate for seizures.   Level of alertness: Awake, asleep  AEDs during EEG study: GBP, Xanax  Technical aspects: This EEG study was done with scalp electrodes positioned according to the 10-20 International system of electrode placement. Electrical activity was reviewed with band pass filter of 1-70Hz , sensitivity of 7 uV/mm, display speed of 82mm/sec with a 60Hz  notched filter applied as appropriate. EEG data were recorded continuously and digitally stored.  Video monitoring was available and reviewed as appropriate.  Description: The posterior dominant rhythm consists of 6-7 Hz activity of moderate voltage (25-35 uV) seen predominantly in posterior head regions, symmetric and reactive to eye opening and eye closing. Sleep was characterized by vertex waves, sleep spindles (12 to 14 Hz), maximal frontocentral region.  EEG showed continuous generalized 5 to 7 Hz theta slowing. Hyperventilation and photic stimulation were not performed.     Of note, parts of study were technically difficult due to significant electrode artifact., specifically FP2 electrode.   ABNORMALITY - Continuous slow, generalized  IMPRESSION: This study is suggestive of moderate diffuse encephalopathy, nonspecific etiology. No seizures or epileptiform discharges were seen throughout the recording.    [name]

## 2022-11-29 NOTE — TOC CM/SW Note (Signed)
Transition of Care [facility]) - Inpatient Brief Assessment   Patient Details  Name: Henry Owens MRN: [id number] Date of Birth: [birth year]  [facility] of [facility]) CM/SW Contact:    Darrold Span, RN Phone Number: 11/29/2022, 3:42 PM   Clinical Narrative: From home w/ wife,  We will continue to monitor patient advancement through interdisciplinary progression rounds. If new patient transition needs arise, please place a TOC consult.   Transition of Care Asessment: Insurance and Status: Insurance coverage has been reviewed Patient has primary care physician: Yes Home environment has been reviewed: home Prior level of function:: independent Prior/Current Home Services: No current home services Social Determinants of Health Reivew: SDOH reviewed no interventions necessary Readmission risk has been reviewed: Yes Transition of care needs: transition of care needs identified, TOC will continue to follow

## 2022-11-29 NOTE — Progress Notes (Signed)
[facility] Progress Note     Subjective: CC-  Wife at bedside. Having more abdominal pain today, mostly on the left side. Also concerned about worsening ecchymosis. N/v improved and he is tolerating PO. Passing flatus, no BM. Hgb 7.9 from 8.2  Objective: Vital signs in last 24 hours: Temp:  [97.5 F (36.4 C)-99.1 F (37.3 C)] 97.5 F (36.4 C) (08/07 1128) Pulse Rate:  [70-83] 70 (08/07 1128) Resp:  [13-19] 14 (08/07 1128) BP: (91-184)/(62-85) 91/73 (08/07 1128) SpO2:  [90 %-98 %] 98 % (08/07 1128) Last BM Date : 11/26/22  Intake/Output from previous day: 08/06 0701 - 08/07 0700 In: 760 [P.O.:480; Blood:280] Out: 3545 [Urine:3545] Intake/Output this shift: Total I/O In: 3300 [P.O.:360; I.V.:2940] Out: 600 [Urine:600]  PE: General appearance: alert, NAD Resp: rate and effort normal on room air GI: somewhat firm, mild left sided TTP without rebound or guarding Large L flank hematoma tender, binder replaced  Lab Results:  Recent Labs    11/28/22 1448 11/29/22 0120  WBC 6.2 5.3  HGB 8.2* 7.9*  HCT 24.1* 23.4*  PLT 110* 106*   BMET Recent Labs    11/28/22 0214 11/29/22 0120  NA 130* 137  K 3.6 3.9  CL 101 102  CO2 21* 21*  GLUCOSE 106* 96  BUN 17 14  CREATININE 1.97* 1.50*  CALCIUM 8.2* 8.5*   PT/INR Recent Labs    11/26/22 1823  LABPROT 14.2  INR 1.1   CMP     Component Value Date/Time   NA 137 11/29/2022 0120   K 3.9 11/29/2022 0120   CL 102 11/29/2022 0120   CO2 21 (L) 11/29/2022 0120   GLUCOSE 96 11/29/2022 0120   BUN 14 11/29/2022 0120   CREATININE 1.50 (H) 11/29/2022 0120   CALCIUM 8.5 (L) 11/29/2022 0120   PROT 6.9 11/26/2022 1820   ALBUMIN 4.2 11/26/2022 1820   AST 24 11/26/2022 1820   ALT 25 11/26/2022 1820   ALKPHOS 168 (H) 11/26/2022 1820   BILITOT 0.9 11/26/2022 1820   GFRNONAA 53 (L) 11/29/2022 0120   Lipase     Component Value Date/Time   LIPASE 28 11/26/2022 1820       Studies/Results: Overnight  EEG with video  Result Date: 11/28/2022 [name], MD     11/28/2022  6:41 PM Patient Name: Henry Owens MRN: [id number] Epilepsy Attending: Bing Neighbors MD Referring Physician/Provider: Ritta Slot MD Duration: 11/27/22 1307 to 11/28/22 0730  Patient history:  This is a 62 yo patient with spells who is undergoing EEG to evaluate for seizures.  AEDs during EEG study: alprazolam, gabapentin  Technical aspects: This EEG study was done with scalp electrodes positioned according to the 10-20 International system of electrode placement. Electrical activity was reviewed with band pass filter of 1-70Hz , sensitivity of 7 uV/mm, display speed of 1mm/sec with a 60Hz  notched filter applied as appropriate. EEG data were recorded continuously and digitally stored.  Video monitoring was available and reviewed as appropriate.  Description: The occipital dominant rhythm was 6-7 Hz. This activity is reactive to stimulation. Drowsiness was manifested by background fragmentation; deeper stages of sleep were identified by K complexes and sleep spindles. There was no focal slowing. There were no interictal epileptiform discharges. There were no electrographic seizures identified. Photic stimulation and hyperventilation were not performed.  Event button was pushed 11/27/22 at 2120 for patient having "word salad." Concomitant eeg before, during and after the event didn't show any EEG change to suggest seizure.  ABNORMALITY -- Moderate diffuse slowing  IMPRESSION: This study is suggestive of moderate diffuse encephalopathy, nonspecific etiology. No seizures or epileptiform discharges were seen throughout the recording.  Event button was pushed 11/27/22 at 2120 for patient having "word salad." Concomitant eeg before, during and after the event didn't show any EEG change to suggest seizure. Bing Neighbors, MD [facility] [phone] If 7pm- 7am, please page neurology on call as listed in AMION.    MR BRAIN W WO  CONTRAST  Result Date: 11/27/2022 CLINICAL DATA:  Altered mental status EXAM: MRI HEAD WITHOUT AND WITH CONTRAST TECHNIQUE: Multiplanar, multiecho pulse sequences of the brain and surrounding structures were obtained without and with intravenous contrast. CONTRAST:  9mL GADAVIST GADOBUTROL 1 MMOL/ML IV SOLN COMPARISON:  None Available. FINDINGS: Brain: No acute infarct, mass effect or extra-axial collection. No acute or chronic hemorrhage. There is multifocal hyperintense T2-weighted signal within the white matter. Generalized volume loss. 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 The midline structures are normal. There is no abnormal contrast enhancement. Vascular: Major flow voids are preserved. Skull and upper cervical spine: Normal calvarium and skull base. Visualized upper cervical spine and soft tissues are normal. Sinuses/Orbits:No paranasal sinus fluid levels or advanced mucosal thickening. No mastoid or middle ear effusion. Normal orbits. IMPRESSION: 1. No acute intracranial abnormality. 2. 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 Electronically Signed   By: Deatra Robinson M.D.   On: 11/27/2022 19:21   MR CERVICAL SPINE WO CONTRAST  Result Date: 11/27/2022 CLINICAL DATA:  Neck pain, chronic.  Recent fall. EXAM: MRI CERVICAL SPINE WITHOUT CONTRAST TECHNIQUE: Multiplanar, multisequence MR imaging of the cervical spine was performed. No intravenous contrast was administered. COMPARISON:  Cervical spine CT 11/10/2021 FINDINGS: The study is mildly to moderately motion degraded. Alignment: Normal. Vertebrae: Nonspecific diffusely diminished bone marrow T1 signal intensity corresponding to previously described diffuse sclerosis on CT. Preserved vertebral body heights. Bulky, solid bridging anterior vertebral ossification from C4-C6. STIR hyperintensity/fluid signal in the C5-6 disc without other findings to strongly suggest acute cervical spine injury. Cord:  Normal signal. Posterior Fossa, vertebral arteries, paraspinal tissues: No significant paraspinal soft tissue edema or prevertebral fluid. Posterior fossa more fully evaluated on the contemporaneous head MRI. Preserved vertebral artery flow voids. Disc levels: Moderate vertebral spurring and mild left facet arthrosis result in mild left neural foraminal stenosis without spinal stenosis. C3-4: A T2 hyperintense central disc protrusion results in mild spinal stenosis with minimal ventral cord flattening. Asymmetric right uncovertebral spurring and severe right facet arthrosis result in severe right neural foraminal stenosis. C4-5: A small central disc protrusion results in mild spinal stenosis. Patent neural foramina. C5-6: A broad-based posterior disc osteophyte complex and prominent bilateral uncovertebral spurring result in mild spinal stenosis and severe bilateral neural foraminal stenosis. C6-7: Severe disc space narrowing. Disc bulging and left greater than right uncovertebral spurring result in mild-to-moderate spinal stenosis and mild-to-moderate right and severe left neural foraminal stenosis. C7-T1: Negative. IMPRESSION: 1. Multilevel cervical disc degeneration, worst at C6-7 where there is mild-to-moderate spinal stenosis and severe left neural foraminal stenosis. 2. Mild spinal stenosis and severe bilateral neural foraminal stenosis at C5-6. 3. Mild spinal stenosis and severe right neural foraminal stenosis at C3-4. Electronically Signed   By: Sebastian Ache M.D.   On: 11/27/2022 15:17    Anti-infectives: Anti-infectives (From admission, onward)    None        Assessment/Plan Henry Owens is an 62 y.o.  male s/p fall Large left lateral abdominal wall/flank hematoma with active extravasation - s/p 1u PRBC 8/6. Hgb 7.9 from 8.2, monitor ?Neurological event - appreciate neuro w/u. EEG negative for seizure. Rec OP sleep study, d/c LTM, follow up with OP neurology Emphysema - home  meds Hypertension - home meds CKD (baseline cr 1.4, gfr 57) - Cr down to 1.5, improved   ID - none FEN - SLIV, reg diet. Mag citrate today VTE - SCDs, start lovenox Foley - none   Dispo - 4NP. Therapies, mobilize. CBC in AM.  I reviewed Consultant neurology notes, last 24 h vitals and pain scores, last 48 h intake and output, and last 24 h labs and trends.    LOS: 3 days    Franne Forts, [facility] 11/29/2022, 1:05 PM Please see Amion for pager number during day hours 7:00am-4:30pm

## 2022-11-29 NOTE — Progress Notes (Signed)
Maint done, no skin breakdown f8, t8, p8

## 2022-11-29 NOTE — Progress Notes (Signed)
Large hematoma noted on RL Abd to R flank, pt stated hematoma not present yesterday. This nurse outlined the hematoma to evaluate if any changes. PA was notified, and will see pt during rounding.
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
## 2022-11-30 DIAGNOSIS — I10 Essential (primary) hypertension: Secondary | ICD-10-CM | POA: Diagnosis not present

## 2022-11-30 DIAGNOSIS — N189 Chronic kidney disease, unspecified: Secondary | ICD-10-CM | POA: Diagnosis not present

## 2022-11-30 DIAGNOSIS — R110 Nausea: Secondary | ICD-10-CM | POA: Diagnosis not present

## 2022-11-30 DIAGNOSIS — R1084 Generalized abdominal pain: Secondary | ICD-10-CM | POA: Diagnosis not present

## 2022-11-30 MED ORDER — POLYETHYLENE GLYCOL 3350 17 G PO PACK: 17.0000 g | PACK | Freq: Every day | ORAL | Status: DC | PRN

## 2022-11-30 MED ORDER — OXYCODONE HCL 10 MG PO TABS: 5.0000 mg | ORAL_TABLET | Freq: Four times a day (QID) | ORAL | 0 refills | Status: DC | PRN

## 2022-11-30 NOTE — Discharge Summary (Signed)
[facility] Discharge Summary   Patient ID: Henry Owens MRN: [id number] DOB/AGE: 61/06/1960 61 y.o.  Admit date: 11/26/2022 Discharge date: 11/30/2022   Discharge Diagnosis Fall Large left lateral abdominal wall/flank hematoma with active extravasation ?Neurological event  Emphysema  Hypertension  CKD (baseline cr 1.4, gfr 57)  Consultants Neurology  Imaging: Overnight EEG with video  Result Date: 11/28/2022 [name], MD     11/28/2022  6:41 PM Patient Name: Henry Owens MRN: [id number] Epilepsy Attending: Bing Neighbors MD Referring Physician/Provider: Ritta Slot MD Duration: 11/27/22 1307 to 11/28/22 0730  Patient history:  This is a 62 yo patient with spells who is undergoing EEG to evaluate for seizures.  AEDs during EEG study: alprazolam, gabapentin  Technical aspects: This EEG study was done with scalp electrodes positioned according to the 10-20 International system of electrode placement. Electrical activity was reviewed with band pass filter of 1-70Hz , sensitivity of 7 uV/mm, display speed of 85mm/sec with a 60Hz  notched filter applied as appropriate. EEG data were recorded continuously and digitally stored.  Video monitoring was available and reviewed as appropriate.  Description: The occipital dominant rhythm was 6-7 Hz. This activity is reactive to stimulation. Drowsiness was manifested by background fragmentation; deeper stages of sleep were identified by K complexes and sleep spindles. There was no focal slowing. There were no interictal epileptiform discharges. There were no electrographic seizures identified. Photic stimulation and hyperventilation were not performed.  Event button was pushed 11/27/22 at 2120 for patient having "word salad." Concomitant eeg before, during and after the event didn't show any EEG change to suggest seizure.   ABNORMALITY -- Moderate diffuse slowing  IMPRESSION: This study is suggestive of moderate diffuse  encephalopathy, nonspecific etiology. No seizures or epileptiform discharges were seen throughout the recording.  Event button was pushed 11/27/22 at 2120 for patient having "word salad." Concomitant eeg before, during and after the event didn't show any EEG change to suggest seizure. Bing Neighbors, MD [facility] [phone] If 7pm- 7am, please page neurology on call as listed in AMION.     Procedures None  HPI:  Henry Owens is a 62 y.o. male transferred from [facility] ER 8/4 for further evaluation.  Patient presented there earlier today complaining of a fall from Tuesday. Developed discomfort on his side afterwards but sneezed earlier today and had severe left-sided pain with swelling. Went to [facility] and was evaluated.   Patient reports that he actually fell out of bed.  He reports that he has had several events over the past year where he will have what he believes is either a seizure or some type of neurological events or periods of unresponsiveness.  He states that he will tune out and not be able to speak to his wife if she asked him a question.  He states that he saw a neurologist and is awaiting insurance approval for an MRI.  He also reports having intermittent tremors in both hands. He denies drinking.  He quit smoking about a year ago.  He denies any cardiac history.  No TIAs or amaurosis fugax  Hospital Course: Large left lateral abdominal wall/flank hematoma with active extravasation Patient was admitted for observation and pain control. Abdominal binder applied. H/h was monitored and he did require 1 unit PRBCs on 8/6. Following this h/h stabilized.   ?Neurological event  This was not new and patient was being worked up outpatient prior to admission, but given this may have lead to his  fall and required hospital admission Neurology was asked to see here. Patient was kept on telemetry. Neurology obtained MRI brain and c-spine, and EEG which was  negative for seizure. No acute findings on MRI to explain these episodes. Recommend outpatient sleep study and ongoing outpatient work up with neurologist.  Patient worked with therapies during this admission who recommended no therapy follow up upon discharge. On 8/8 the patient was felt stable for discharge home.  Patient will follow up as below and knows to call with questions or concerns.    I have personally reviewed the patients medication history on the [place] controlled substance database.     Allergies as of 11/30/2022       Reactions   Wound Dressing Adhesive Other (See Comments)   BLISTER   Azithromycin Rash   Penicillin G Rash   Has patient had a PCN reaction causing immediate rash, facial/tongue/throat swelling, SOB or lightheadedness with hypotension: Yes Has patient had a PCN reaction causing severe rash involving mucus membranes or skin necrosis: No Has patient had a PCN reaction that required hospitalization: No Has patient had a PCN reaction occurring within the last 10 years: No If all of the above answers are "NO", then may proceed with Cephalosporin use.        Medication List     TAKE these medications    acetaminophen 500 MG tablet Commonly known as: TYLENOL Take 1,000 mg by mouth in the morning, at noon, and at bedtime.   ALPRAZolam 0.25 MG tablet Commonly known as: XANAX Take 0.25 mg by mouth 2 (two) times daily as needed for anxiety or sleep.   amLODipine 5 MG tablet Commonly known as: NORVASC Take 5 mg by mouth daily.   atenolol 50 MG tablet Commonly known as: TENORMIN Take 25 mg by mouth daily.   atorvastatin 10 MG tablet Commonly known as: LIPITOR Take 10 mg by mouth every evening.   budesonide-formoterol 80-4.5 MCG/ACT inhaler Commonly known as: SYMBICORT Inhale 2 puffs into the lungs in the morning and at bedtime.   celecoxib 100 MG capsule Commonly known as: CELEBREX Take 100 mg by mouth 2 (two) times daily.   fluticasone 50 MCG/ACT  nasal spray Commonly known as: FLONASE Place 1 spray into both nostrils daily as needed for allergies or rhinitis.   gabapentin 600 MG tablet Commonly known as: NEURONTIN Take 900 mg by mouth 2 (two) times daily.   lisinopril 10 MG tablet Commonly known as: ZESTRIL Take 10 mg by mouth daily.   naloxone 4 MG/0.1ML Liqd nasal spray kit Commonly known as: NARCAN As needed For opioid reversal (unresponsiveness) intranasal spray   Oxycodone HCl 10 MG Tabs Take 0.5-1 tablets (5-10 mg total) by mouth every 6 (six) hours as needed for severe pain or moderate pain.   pantoprazole 40 MG tablet Commonly known as: Protonix Take 1 tablet (40 mg total) by mouth daily.   polyethylene glycol 17 g packet Commonly known as: MIRALAX / GLYCOLAX Take 17 g by mouth daily as needed (constipation).   tiZANidine 4 MG tablet Commonly known as: ZANAFLEX Take 4 mg by mouth 3 (three) times daily.   venlafaxine XR 150 MG 24 hr capsule Commonly known as: EFFEXOR-XR Take 150 mg by mouth daily with breakfast.          Follow-up Information     Kandyce Rud, MD. Schedule an appointment as soon as possible for a visit.   Specialty: Family Medicine Contact information: [address] [facility]  [facility] [place] [address] [phone]         Lonell Face, MD Follow up.   Specialty: Neurology Why: follow up with neurologist as scheduled for ongoing outpatient work up Contact information: [address] [facility] [address] [facility] [place] [address] [phone]                  Signed: Franne Forts, PA-C [facility] 11/30/2022, 11:16 AM Please see Amion for pager number during day hours 7:00am-4:30pm

## 2022-11-30 NOTE — Progress Notes (Signed)
Physical Therapy Treatment Patient Details Name: Henry Owens MRN: [id number] DOB: [birth year] Today's Date: 11/30/2022   History of Present Illness Henry Owens is a 62yoM who presents on 11/26/22 from [facility] with a fall followed by a sneeze that caused L flank hematoma. He is currently being worked up as outpt by Chief Financial Officer. He has had multiple neuro events and tremors in both hands. 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 Currently having prolonged EEG. PMH: arthritis, anxiety, multiple knee surgeries, back surgery, HTN, emphysema, CKD, hyperglycemia, memory issues    PT Comments  Pt moving much better today despite continued L flank pain. Pt has no pain associated with R hematoma. Abdominal binder is helping with mgmt and pt given a hot pack end of session. RW used for pain control with ambulation and pt able to go 150' with supervision without LOB or gait alteration. Pt can tolerate short distances without AD but gait is antalgic. VSS during ambulation. Will continue to follow acutely but no PT needed at d/c. Rec continued use of IS, pt demo'd effectively.    If plan is discharge home, recommend the following: A little help with walking and/or transfers;A little help with bathing/dressing/bathroom;Assistance with cooking/housework;Help with stairs or ramp for entrance;Assist for transportation   Can travel by private vehicle        Equipment Recommendations  None recommended by PT    Recommendations for Other Services       Precautions / Restrictions Precautions Precautions: Fall Precaution Comments: had some sort of neuro event and fell out of bed. Got self back into bed and did not tell wife until next day Restrictions Weight Bearing Restrictions: No     Mobility  Bed Mobility Overal bed mobility: Modified Independent Bed Mobility: Supine to Sit, Sit to Supine     Supine to sit: Modified independent (Device/Increase time) Sit to  supine: Modified independent (Device/Increase time)   General bed mobility comments: pt has pain with bed mobility but able to come to EOB and return to supine without assist or use of rail, HOB elevated to 20 deg    Transfers Overall transfer level: Needs assistance Equipment used: Rolling walker (2 wheels) Transfers: Sit to/from Stand Sit to Stand: Supervision           General transfer comment: able to stand without use of hands. Supervision for safety    Ambulation/Gait Ambulation/Gait assistance: Supervision Gait Distance (Feet): 150 Feet Assistive device: Rolling walker (2 wheels) Gait Pattern/deviations: Step-through pattern Gait velocity: decreased Gait velocity interpretation: 1.31 - 2.62 ft/sec, indicative of limited community ambulator   General Gait Details: used RW for pain control and pt has one at home that he will use while pain decreases. Even gait pattern, though guarded. No LOB   Stairs             Wheelchair Mobility     Tilt Bed    Modified Rankin (Stroke Patients Only)       Balance Overall balance assessment: History of Falls, Needs assistance Sitting-balance support: No upper extremity supported, Feet supported Sitting balance-Leahy Scale: Good     Standing balance support: No upper extremity supported, During functional activity Standing balance-Leahy Scale: Good Standing balance comment: pt able to stand and use toilet as well as take steps within bathroom without UE support and ambulate to bed without UE support.  Cognition Arousal: Alert Behavior During Therapy: WFL for tasks assessed/performed Overall Cognitive Status: History of cognitive impairments - at baseline                                 General Comments: pt at baseline        Exercises      General Comments General comments (skin integrity, edema, etc.): VSS, HR in 70's with ambulation. MD present end of  session and agreeable to hot pack L flank      Pertinent Vitals/Pain Pain Assessment Pain Assessment: 0-10 Pain Score: 7  Faces Pain Scale: Hurts worst Pain Location: L flank Pain Descriptors / Indicators: Constant, Tender Pain Intervention(s): Limited activity within patient's tolerance, Monitored during session, Heat applied    Home Living                          Prior Function            PT Goals (current goals can now be found in the care plan section) Acute Rehab PT Goals Patient Stated Goal: return home PT Goal Formulation: With patient Time For Goal Achievement: 12/12/22 Potential to Achieve Goals: Good Progress towards PT goals: Progressing toward goals    Frequency    Min 1X/week      PT Plan      Co-evaluation              AM-PAC PT "6 Clicks" Mobility   Outcome Measure  Help needed turning from your back to your side while in a flat bed without using bedrails?: None Help needed moving from lying on your back to sitting on the side of a flat bed without using bedrails?: None Help needed moving to and from a bed to a chair (including a wheelchair)?: None Help needed standing up from a chair using your arms (e.g., wheelchair or bedside chair)?: None Help needed to walk in hospital room?: A Little Help needed climbing 3-5 steps with a railing? : A Little 6 Click Score: 22    End of Session Equipment Utilized During Treatment: Gait belt Activity Tolerance: Patient tolerated treatment well Patient left: in bed;with call bell/phone within reach;with family/visitor present Nurse Communication: Mobility status PT Visit Diagnosis: Unsteadiness on feet (R26.81);History of falling (Z91.81);Pain;Repeated falls (R29.6) Pain - Right/Left: Left Pain - part of body:  (flank)     Time: 2956-2130 PT Time Calculation (min) (ACUTE ONLY): 30 min  Charges:    $Gait Training: 23-37 mins PT General Charges $$ ACUTE PT VISIT: 1 Visit                      Lyanne Co, PT  Acute Rehab Services Secure chat preferred Office [phone]    Lawana Chambers  11/30/2022, 9:29 AM

## 2022-11-30 NOTE — Progress Notes (Signed)
Patient ID: Henry Owens, male   DOB: 11-05-1960, 62 y.o.   MRN: [id number]      Subjective: Did well with therapies just now ROS negative except as listed above. Objective: Vital signs in last 24 hours: Temp:  [97.5 F (36.4 C)-98.5 F (36.9 C)] 97.9 F (36.6 C) (08/08 0724) Pulse Rate:  [63-73] 65 (08/08 0813) Resp:  [10-19] 10 (08/08 0724) BP: (91-141)/(64-73) 129/67 (08/08 0724) SpO2:  [91 %-98 %] 93 % (08/08 0813) Last BM Date : 11/29/22  Intake/Output from previous day: 08/07 0701 - 08/08 0700 In: 3780 [P.O.:840; I.V.:2940] Out: 600 [Urine:600] Intake/Output this shift: Total I/O In: 240 [P.O.:240] Out: 200 [Urine:200]  General appearance: alert and cooperative Resp: clear after cough Cardio: regular rate and rhythm GI: large hematoma evolving, binder replaced  Lab Results: CBC  Recent Labs    11/29/22 0120 11/30/22 0235  WBC 5.3 5.6  HGB 7.9* 8.0*  HCT 23.4* 24.0*  PLT 106* 123*   BMET Recent Labs    11/29/22 0120 11/30/22 0235  NA 137 137  K 3.9 4.1  CL 102 103  CO2 21* 25  GLUCOSE 96 116*  BUN 14 11  CREATININE 1.50* 1.38*  CALCIUM 8.5* 8.8*   PT/INR No results for input(s): "LABPROT", "INR" in the last 72 hours. ABG No results for input(s): "PHART", "HCO3" in the last 72 hours.  Invalid input(s): "PCO2", "PO2"  Studies/Results: Overnight EEG with video  Result Date: 11/28/2022 [name], MD     11/28/2022  6:41 PM Patient Name: Henry Owens MRN: [id number] Epilepsy Attending: Bing Neighbors MD Referring Physician/Provider: Ritta Slot MD Duration: 11/27/22 1307 to 11/28/22 0730  Patient history:  This is a 62 yo patient with spells who is undergoing EEG to evaluate for seizures.  AEDs during EEG study: alprazolam, gabapentin  Technical aspects: This EEG study was done with scalp electrodes positioned according to the 10-20 International system of electrode placement. Electrical activity was reviewed with band pass filter  of 1-70Hz , sensitivity of 7 uV/mm, display speed of 34mm/sec with a 60Hz  notched filter applied as appropriate. EEG data were recorded continuously and digitally stored.  Video monitoring was available and reviewed as appropriate.  Description: The occipital dominant rhythm was 6-7 Hz. This activity is reactive to stimulation. Drowsiness was manifested by background fragmentation; deeper stages of sleep were identified by K complexes and sleep spindles. There was no focal slowing. There were no interictal epileptiform discharges. There were no electrographic seizures identified. Photic stimulation and hyperventilation were not performed.  Event button was pushed 11/27/22 at 2120 for patient having "word salad." Concomitant eeg before, during and after the event didn't show any EEG change to suggest seizure.   ABNORMALITY -- Moderate diffuse slowing  IMPRESSION: This study is suggestive of moderate diffuse encephalopathy, nonspecific etiology. No seizures or epileptiform discharges were seen throughout the recording.  Event button was pushed 11/27/22 at 2120 for patient having "word salad." Concomitant eeg before, during and after the event didn't show any EEG change to suggest seizure. Bing Neighbors, MD [facility] [phone] If 7pm- 7am, please page neurology on call as listed in AMION.     Anti-infectives: Anti-infectives (From admission, onward)    None       Assessment/Plan: Henry Owens is an 62 y.o. male s/p fall Large left lateral abdominal wall/flank hematoma with active extravasation - s/p 1u PRBC 8/6. Hgb 8/stable ?Neurological event - appreciate neuro w/u. EEG negative for seizure. Rec OP sleep  study, d/c LTM, follow up with OP neurology Emphysema - home meds Hypertension - home meds CKD (baseline cr 1.4, gfr 57) - Cr down to 1.5, improved   ID - none FEN - SLIV, reg diet. Mag citrate today VTE - SCDs, start lovenox Foley - none   Dispo - D/C I spoke with his  wife as well   LOS: 4 days    Violeta Gelinas, MD, MPH, FACS Trauma & General Surgery Use [email or web address] to contact on call provider  11/30/2022

## 2022-12-04 DIAGNOSIS — J9601 Acute respiratory failure with hypoxia: Secondary | ICD-10-CM | POA: Diagnosis not present

## 2022-12-04 DIAGNOSIS — D62 Acute posthemorrhagic anemia: Secondary | ICD-10-CM | POA: Diagnosis not present

## 2022-12-04 DIAGNOSIS — Z03818 Encounter for observation for suspected exposure to other biological agents ruled out: Secondary | ICD-10-CM | POA: Diagnosis not present

## 2022-12-07 DIAGNOSIS — J9601 Acute respiratory failure with hypoxia: Secondary | ICD-10-CM | POA: Diagnosis not present

## 2022-12-07 DIAGNOSIS — R110 Nausea: Secondary | ICD-10-CM | POA: Diagnosis not present

## 2022-12-10 ENCOUNTER — Emergency Department: Payer: Medicaid Other

## 2022-12-10 ENCOUNTER — Inpatient Hospital Stay
Admission: EM | Admit: 2022-12-10 | Discharge: 2022-12-14 | DRG: 177 | Disposition: A | Discharge status: Home / Self Care | Payer: Medicaid Other | Attending: Internal Medicine | Admitting: Internal Medicine

## 2022-12-10 ENCOUNTER — Other Ambulatory Visit: Payer: Self-pay

## 2022-12-10 DIAGNOSIS — Z881 Allergy status to other antibiotic agents status: Secondary | ICD-10-CM

## 2022-12-10 DIAGNOSIS — R61 Generalized hyperhidrosis: Secondary | ICD-10-CM | POA: Diagnosis not present

## 2022-12-10 DIAGNOSIS — F17210 Nicotine dependence, cigarettes, uncomplicated: Secondary | ICD-10-CM | POA: Diagnosis present

## 2022-12-10 DIAGNOSIS — Z7951 Long term (current) use of inhaled steroids: Secondary | ICD-10-CM

## 2022-12-10 DIAGNOSIS — F419 Anxiety disorder, unspecified: Secondary | ICD-10-CM | POA: Diagnosis not present

## 2022-12-10 DIAGNOSIS — Z888 Allergy status to other drugs, medicaments and biological substances status: Secondary | ICD-10-CM

## 2022-12-10 DIAGNOSIS — E663 Overweight: Secondary | ICD-10-CM | POA: Diagnosis present

## 2022-12-10 DIAGNOSIS — R1111 Vomiting without nausea: Secondary | ICD-10-CM | POA: Diagnosis not present

## 2022-12-10 DIAGNOSIS — E872 Acidosis, unspecified: Secondary | ICD-10-CM | POA: Diagnosis not present

## 2022-12-10 DIAGNOSIS — I10 Essential (primary) hypertension: Secondary | ICD-10-CM | POA: Diagnosis not present

## 2022-12-10 DIAGNOSIS — J690 Pneumonitis due to inhalation of food and vomit: Principal | ICD-10-CM | POA: Diagnosis present

## 2022-12-10 DIAGNOSIS — R000 Tachycardia, unspecified: Secondary | ICD-10-CM | POA: Diagnosis not present

## 2022-12-10 DIAGNOSIS — F32A Depression, unspecified: Secondary | ICD-10-CM | POA: Diagnosis not present

## 2022-12-10 DIAGNOSIS — K659 Peritonitis, unspecified: Secondary | ICD-10-CM | POA: Diagnosis not present

## 2022-12-10 DIAGNOSIS — G894 Chronic pain syndrome: Secondary | ICD-10-CM | POA: Diagnosis present

## 2022-12-10 DIAGNOSIS — R55 Syncope and collapse: Secondary | ICD-10-CM | POA: Diagnosis not present

## 2022-12-10 DIAGNOSIS — R112 Nausea with vomiting, unspecified: Secondary | ICD-10-CM

## 2022-12-10 DIAGNOSIS — J449 Chronic obstructive pulmonary disease, unspecified: Secondary | ICD-10-CM | POA: Diagnosis present

## 2022-12-10 DIAGNOSIS — Z800 Family history of malignant neoplasm of digestive organs: Secondary | ICD-10-CM

## 2022-12-10 DIAGNOSIS — N179 Acute kidney failure, unspecified: Secondary | ICD-10-CM | POA: Diagnosis not present

## 2022-12-10 DIAGNOSIS — J189 Pneumonia, unspecified organism: Secondary | ICD-10-CM | POA: Diagnosis not present

## 2022-12-10 DIAGNOSIS — Z880 Allergy status to penicillin: Secondary | ICD-10-CM

## 2022-12-10 DIAGNOSIS — N1831 Chronic kidney disease, stage 3a: Secondary | ICD-10-CM | POA: Diagnosis not present

## 2022-12-10 DIAGNOSIS — S301XXA Contusion of abdominal wall, initial encounter: Secondary | ICD-10-CM | POA: Diagnosis not present

## 2022-12-10 DIAGNOSIS — R109 Unspecified abdominal pain: Secondary | ICD-10-CM | POA: Diagnosis not present

## 2022-12-10 DIAGNOSIS — F1223 Cannabis dependence with withdrawal: Secondary | ICD-10-CM | POA: Diagnosis not present

## 2022-12-10 DIAGNOSIS — F1290 Cannabis use, unspecified, uncomplicated: Secondary | ICD-10-CM | POA: Diagnosis not present

## 2022-12-10 DIAGNOSIS — R531 Weakness: Secondary | ICD-10-CM | POA: Diagnosis not present

## 2022-12-10 DIAGNOSIS — E876 Hypokalemia: Secondary | ICD-10-CM | POA: Diagnosis present

## 2022-12-10 DIAGNOSIS — Z1152 Encounter for screening for COVID-19: Secondary | ICD-10-CM | POA: Diagnosis not present

## 2022-12-10 DIAGNOSIS — K529 Noninfective gastroenteritis and colitis, unspecified: Secondary | ICD-10-CM | POA: Diagnosis not present

## 2022-12-10 DIAGNOSIS — J168 Pneumonia due to other specified infectious organisms: Secondary | ICD-10-CM | POA: Diagnosis not present

## 2022-12-10 DIAGNOSIS — R1012 Left upper quadrant pain: Secondary | ICD-10-CM | POA: Diagnosis not present

## 2022-12-10 DIAGNOSIS — S301XXD Contusion of abdominal wall, subsequent encounter: Principal | ICD-10-CM

## 2022-12-10 DIAGNOSIS — D519 Vitamin B12 deficiency anemia, unspecified: Secondary | ICD-10-CM | POA: Diagnosis not present

## 2022-12-10 DIAGNOSIS — Z79899 Other long term (current) drug therapy: Secondary | ICD-10-CM | POA: Diagnosis not present

## 2022-12-10 DIAGNOSIS — M1990 Unspecified osteoarthritis, unspecified site: Secondary | ICD-10-CM | POA: Diagnosis present

## 2022-12-10 DIAGNOSIS — F19939 Other psychoactive substance use, unspecified with withdrawal, unspecified: Secondary | ICD-10-CM | POA: Diagnosis not present

## 2022-12-10 DIAGNOSIS — G9341 Metabolic encephalopathy: Secondary | ICD-10-CM | POA: Diagnosis not present

## 2022-12-10 DIAGNOSIS — D631 Anemia in chronic kidney disease: Secondary | ICD-10-CM | POA: Diagnosis not present

## 2022-12-10 DIAGNOSIS — K7689 Other specified diseases of liver: Secondary | ICD-10-CM | POA: Diagnosis not present

## 2022-12-10 DIAGNOSIS — W19XXXA Unspecified fall, initial encounter: Secondary | ICD-10-CM | POA: Diagnosis present

## 2022-12-10 DIAGNOSIS — K828 Other specified diseases of gallbladder: Secondary | ICD-10-CM | POA: Diagnosis not present

## 2022-12-10 DIAGNOSIS — D513 Other dietary vitamin B12 deficiency anemia: Secondary | ICD-10-CM | POA: Diagnosis not present

## 2022-12-10 LAB — URINALYSIS, ROUTINE W REFLEX MICROSCOPIC
Bacteria, UA: NONE SEEN
Bilirubin Urine: NEGATIVE
Glucose, UA: NEGATIVE mg/dL
Ketones, ur: 5 mg/dL — AB
Leukocytes,Ua: NEGATIVE
Nitrite: NEGATIVE
Protein, ur: NEGATIVE mg/dL
Specific Gravity, Urine: >1.046 — ABNORMAL HIGH (ref 1.005–1.030)
Squamous Epithelial / HPF: NONE SEEN /HPF (ref 0–5)
pH: 5 (ref 5.0–8.0)

## 2022-12-10 LAB — COMPREHENSIVE METABOLIC PANEL
ALT: 19 U/L (ref 0–44)
AST: 22 U/L (ref 15–41)
Albumin: 4.5 g/dL (ref 3.5–5.0)
Alkaline Phosphatase: 195 U/L — ABNORMAL HIGH (ref 38–126)
Anion gap: 14 (ref 5–15)
BUN: 12 mg/dL (ref 8–23)
CO2: 20 mmol/L — ABNORMAL LOW (ref 22–32)
Calcium: 9.7 mg/dL (ref 8.9–10.3)
Chloride: 103 mmol/L (ref 98–111)
Creatinine, Ser: 1.48 mg/dL — ABNORMAL HIGH (ref 0.61–1.24)
GFR, Estimated: 53 mL/min — ABNORMAL LOW (ref >60)
Glucose, Bld: 171 mg/dL — ABNORMAL HIGH (ref 70–99)
Potassium: 3.3 mmol/L — ABNORMAL LOW (ref 3.5–5.1)
Sodium: 137 mmol/L (ref 135–145)
Total Bilirubin: 1.5 mg/dL — ABNORMAL HIGH (ref 0.3–1.2)
Total Protein: 8.2 g/dL — ABNORMAL HIGH (ref 6.5–8.1)

## 2022-12-10 LAB — CBC
HCT: 34.7 % — ABNORMAL LOW (ref 39.0–52.0)
Hemoglobin: 11.3 g/dL — ABNORMAL LOW (ref 13.0–17.0)
MCH: 31.7 pg (ref 26.0–34.0)
MCHC: 32.6 g/dL (ref 30.0–36.0)
MCV: 97.2 fL (ref 80.0–100.0)
Platelets: 455 10*3/uL — ABNORMAL HIGH (ref 150–400)
RBC: 3.57 MIL/uL — ABNORMAL LOW (ref 4.22–5.81)
RDW: 16.1 % — ABNORMAL HIGH (ref 11.5–15.5)
WBC: 13.1 10*3/uL — ABNORMAL HIGH (ref 4.0–10.5)
nRBC: 1.3 % — ABNORMAL HIGH (ref 0.0–0.2)

## 2022-12-10 LAB — HEMOGLOBIN A1C
Hgb A1c MFr Bld: 5.5 % (ref 4.8–5.6)
Mean Plasma Glucose: 111.15 mg/dL

## 2022-12-10 LAB — ETHANOL: Alcohol, Ethyl (B): <10 mg/dL (ref <10)

## 2022-12-10 LAB — SARS CORONAVIRUS 2 BY RT PCR: SARS Coronavirus 2 by RT PCR: NEGATIVE

## 2022-12-10 LAB — MAGNESIUM: Magnesium: 2.4 mg/dL (ref 1.7–2.4)

## 2022-12-10 LAB — STREP PNEUMONIAE URINARY ANTIGEN: Strep Pneumo Urinary Antigen: NEGATIVE

## 2022-12-10 LAB — LIPASE, BLOOD: Lipase: 45 U/L (ref 11–51)

## 2022-12-10 LAB — LACTIC ACID, PLASMA
Lactic Acid, Venous: 1.9 mmol/L (ref 0.5–1.9)
Lactic Acid, Venous: 1.2 mmol/L (ref 0.5–1.9)

## 2022-12-10 LAB — BRAIN NATRIURETIC PEPTIDE: B Natriuretic Peptide: 52.6 pg/mL (ref 0.0–100.0)

## 2022-12-10 MED ORDER — FLUTICASONE PROPIONATE 50 MCG/ACT NA SUSP: 1.0000 | Freq: Every day | NASAL | Status: DC | PRN

## 2022-12-10 MED ORDER — SODIUM CHLORIDE 0.9 % IV SOLN
12.5000 mg | Freq: Once | INTRAVENOUS | Status: AC
  Administered 2022-12-10: 12.5 mg via INTRAVENOUS
  Filled 2022-12-10: qty 0.5

## 2022-12-10 MED ORDER — POLYETHYLENE GLYCOL 3350 17 G PO PACK: 17.0000 g | PACK | Freq: Every day | ORAL | Status: DC | PRN

## 2022-12-10 MED ORDER — IOHEXOL 300 MG/ML  SOLN
100.0000 mL | Freq: Once | INTRAMUSCULAR | Status: AC | PRN
  Administered 2022-12-10: 100 mL via INTRAVENOUS

## 2022-12-10 MED ORDER — HYDROMORPHONE HCL 1 MG/ML IJ SOLN
0.5000 mg | Freq: Once | INTRAMUSCULAR | Status: AC
  Administered 2022-12-10: 0.5 mg via INTRAVENOUS
  Filled 2022-12-10: qty 0.5

## 2022-12-10 MED ORDER — HYDROMORPHONE HCL 1 MG/ML IJ SOLN
1.0000 mg | INTRAMUSCULAR | Status: DC | PRN
  Administered 2022-12-11 – 2022-12-12 (×10): 1 mg via INTRAVENOUS
  Filled 2022-12-10 (×10): qty 1

## 2022-12-10 MED ORDER — ACETAMINOPHEN 325 MG PO TABS
650.0000 mg | ORAL_TABLET | Freq: Four times a day (QID) | ORAL | Status: DC | PRN
  Administered 2022-12-11: 650 mg via ORAL
  Filled 2022-12-10: qty 2

## 2022-12-10 MED ORDER — HYDROMORPHONE HCL 1 MG/ML IJ SOLN
1.0000 mg | INTRAMUSCULAR | Status: DC | PRN
  Administered 2022-12-10: 1 mg via INTRAVENOUS
  Filled 2022-12-10: qty 1

## 2022-12-10 MED ORDER — ONDANSETRON HCL 4 MG/2ML IJ SOLN
4.0000 mg | Freq: Four times a day (QID) | INTRAMUSCULAR | Status: DC | PRN
  Administered 2022-12-11 – 2022-12-12 (×4): 4 mg via INTRAVENOUS
  Filled 2022-12-10 (×5): qty 2

## 2022-12-10 MED ORDER — SODIUM CHLORIDE 0.9 % IV SOLN
2.0000 g | Freq: Three times a day (TID) | INTRAVENOUS | Status: DC
  Administered 2022-12-11: 2 g via INTRAVENOUS
  Filled 2022-12-10 (×2): qty 12.5

## 2022-12-10 MED ORDER — SODIUM CHLORIDE 0.9 % IV SOLN
2.0000 g | Freq: Once | INTRAVENOUS | Status: AC
  Administered 2022-12-10: 2 g via INTRAVENOUS
  Filled 2022-12-10: qty 20

## 2022-12-10 MED ORDER — MORPHINE SULFATE (PF) 4 MG/ML IV SOLN
4.0000 mg | Freq: Once | INTRAVENOUS | Status: AC
  Administered 2022-12-10: 4 mg via INTRAVENOUS
  Filled 2022-12-10: qty 1

## 2022-12-10 MED ORDER — LACTATED RINGERS IV BOLUS
1000.0000 mL | Freq: Once | INTRAVENOUS | Status: AC
  Administered 2022-12-10: 1000 mL via INTRAVENOUS

## 2022-12-10 MED ORDER — ONDANSETRON HCL 4 MG PO TABS: 4.0000 mg | ORAL_TABLET | Freq: Four times a day (QID) | ORAL | Status: DC | PRN

## 2022-12-10 MED ORDER — ALPRAZOLAM 0.25 MG PO TABS
0.2500 mg | ORAL_TABLET | Freq: Two times a day (BID) | ORAL | Status: DC | PRN
  Administered 2022-12-11 – 2022-12-14 (×5): 0.25 mg via ORAL
  Filled 2022-12-10 (×7): qty 1

## 2022-12-10 MED ORDER — ONDANSETRON HCL 4 MG/2ML IJ SOLN
4.0000 mg | Freq: Once | INTRAMUSCULAR | Status: AC | PRN
  Administered 2022-12-10: 4 mg via INTRAVENOUS
  Filled 2022-12-10: qty 2

## 2022-12-10 MED ORDER — PANTOPRAZOLE SODIUM 40 MG PO TBEC
40.0000 mg | DELAYED_RELEASE_TABLET | Freq: Every day | ORAL | Status: DC
  Filled 2022-12-10: qty 1

## 2022-12-10 MED ORDER — SODIUM CHLORIDE 0.9 % IV SOLN
100.0000 mg | Freq: Once | INTRAVENOUS | Status: AC
  Administered 2022-12-10: 100 mg via INTRAVENOUS
  Filled 2022-12-10: qty 100

## 2022-12-10 MED ORDER — MOMETASONE FURO-FORMOTEROL FUM 100-5 MCG/ACT IN AERO
2.0000 | INHALATION_SPRAY | Freq: Two times a day (BID) | RESPIRATORY_TRACT | Status: DC
  Administered 2022-12-12 – 2022-12-14 (×4): 2 via RESPIRATORY_TRACT
  Filled 2022-12-10 (×2): qty 8.8

## 2022-12-10 MED ORDER — AMLODIPINE BESYLATE 5 MG PO TABS
5.0000 mg | ORAL_TABLET | Freq: Every day | ORAL | Status: DC
  Administered 2022-12-10 – 2022-12-14 (×3): 5 mg via ORAL
  Filled 2022-12-10 (×5): qty 1

## 2022-12-10 MED ORDER — ACETAMINOPHEN 650 MG RE SUPP: 650.0000 mg | Freq: Four times a day (QID) | RECTAL | Status: DC | PRN

## 2022-12-10 MED ORDER — ALBUTEROL SULFATE (2.5 MG/3ML) 0.083% IN NEBU: 2.5000 mg | INHALATION_SOLUTION | RESPIRATORY_TRACT | Status: DC | PRN

## 2022-12-10 MED ORDER — SODIUM CHLORIDE 0.9 % IV SOLN: INTRAVENOUS | Status: AC

## 2022-12-10 MED ORDER — GABAPENTIN 300 MG PO CAPS
900.0000 mg | ORAL_CAPSULE | Freq: Two times a day (BID) | ORAL | Status: DC
  Administered 2022-12-10 – 2022-12-14 (×5): 900 mg via ORAL
  Filled 2022-12-10 (×7): qty 3

## 2022-12-10 MED ORDER — ATENOLOL 25 MG PO TABS
25.0000 mg | ORAL_TABLET | Freq: Every day | ORAL | Status: DC
  Administered 2022-12-10 – 2022-12-14 (×3): 25 mg via ORAL
  Filled 2022-12-10 (×5): qty 1

## 2022-12-10 MED ORDER — TIZANIDINE HCL 4 MG PO TABS
4.0000 mg | ORAL_TABLET | Freq: Three times a day (TID) | ORAL | Status: DC
  Administered 2022-12-10 – 2022-12-14 (×6): 4 mg via ORAL
  Filled 2022-12-10 (×12): qty 1

## 2022-12-10 MED ORDER — SODIUM CHLORIDE 0.9 % IV SOLN
500.0000 mg | INTRAVENOUS | Status: DC
  Administered 2022-12-10: 500 mg via INTRAVENOUS
  Filled 2022-12-10 (×2): qty 5

## 2022-12-10 NOTE — ED Notes (Signed)
Pt was not able to urinate. 

## 2022-12-10 NOTE — ED Triage Notes (Signed)
Refer to First Nurse Note at 1358.

## 2022-12-10 NOTE — ED Provider Notes (Signed)
[facility] Provider Note    Event Date/Time   First MD Initiated Contact with Patient 12/10/22 1521     (approximate)   History   Chief Complaint Abdominal Pain   HPI  Henry Owens is a 62 y.o. male with past medical history of hypertension, CKD, COPD, chronic pain syndrome presents to the ED complaining of abdominal pain.  Patient was recently admitted to [facility] following a fall with large left abdominal wall hematoma.  He was discharged about 10 days ago, states that he has continued to have abdominal pain with nausea and vomiting since then.  Nausea and vomiting has worsened while the pain has remained about the same, today he comes to the ED because he has been unable to keep down either liquids or solids.  He denies any fevers and has not had any diarrhea or dysuria.  He denies any new falls, has been taking oxycodone and Zofran without relief of his symptoms.  Wife also states that he has been dealing with a wet cough, patient denies any chest pain or shortness of breath.     Physical Exam   Triage Vital Signs: ED Triage Vitals [12/10/22 1444]  Encounter Vitals Group     BP (!) 151/110     Systolic BP Percentile      Diastolic BP Percentile      Pulse Rate (!) 134     Resp 18     Temp 98.6 F (37 C)     Temp Source Oral     SpO2 99 %     Weight 193 lb 9 oz (87.8 kg)     Height 5\' 10"  (1.778 m)     Head Circumference      Peak Flow      Pain Score 8     Pain Loc      Pain Education      Exclude from Growth Chart     Most recent vital signs: Vitals:   12/10/22 1444  BP: (!) 151/110  Pulse: (!) 134  Resp: 18  Temp: 98.6 F (37 C)  SpO2: 99%    Constitutional: Alert and oriented. Eyes: Conjunctivae are normal. Head: Atraumatic. Nose: No congestion/rhinnorhea. Mouth/Throat: Mucous membranes are moist.  Cardiovascular: Tachycardic, regular rhythm. Grossly normal heart sounds.  2+ radial pulses  bilaterally. Respiratory: Normal respiratory effort.  No retractions. Lungs CTAB. Gastrointestinal: Soft and tender to palpation over the left upper and lower quadrants with palpable large hematoma.  Lower abdominal ecchymosis noted.  No distention. Musculoskeletal: No lower extremity tenderness nor edema.  Neurologic:  Normal speech and language. No gross focal neurologic deficits are appreciated.    ED Results / Procedures / Treatments   Labs (all labs ordered are listed, but only abnormal results are displayed) Labs Reviewed  COMPREHENSIVE METABOLIC PANEL - Abnormal; Notable for the following components:      Result Value   Potassium 3.3 (*)    CO2 20 (*)    Glucose, Bld 171 (*)    Creatinine, Ser 1.48 (*)    Total Protein 8.2 (*)    Alkaline Phosphatase 195 (*)    Total Bilirubin 1.5 (*)    GFR, Estimated 53 (*)    All other components within normal limits  CBC - Abnormal; Notable for the following components:   WBC 13.1 (*)    RBC 3.57 (*)    Hemoglobin 11.3 (*)    HCT 34.7 (*)    RDW  16.1 (*)    Platelets 455 (*)    nRBC 1.3 (*)    All other components within normal limits  CULTURE, BLOOD (ROUTINE X 2)  CULTURE, BLOOD (ROUTINE X 2)  LIPASE, BLOOD  MAGNESIUM  URINALYSIS, ROUTINE W REFLEX MICROSCOPIC  LACTIC ACID, PLASMA  LACTIC ACID, PLASMA   RADIOLOGY CT abdomen/pelvis reviewed and interpreted by me with large left abdominal wall hematoma and infiltrate in the right lower lobe.  PROCEDURES:  Critical Care performed: Yes, see critical care procedure note(s)  .Critical Care  Performed by: Chesley Noon, MD Authorized by: Chesley Noon, MD   Critical care provider statement:    Critical care time (minutes):  30   Critical care time was exclusive of:  Separately billable procedures and treating other patients and teaching time   Critical care was necessary to treat or prevent imminent or life-threatening deterioration of the following conditions:  Sepsis    Critical care was time spent personally by me on the following activities:  Development of treatment plan with patient or surrogate, discussions with consultants, evaluation of patient's response to treatment, examination of patient, ordering and review of laboratory studies, ordering and review of radiographic studies, ordering and performing treatments and interventions, pulse oximetry, re-evaluation of patient's condition and review of old charts   I assumed direction of critical care for this patient from another provider in my specialty: no     Care discussed with: admitting provider      MEDICATIONS ORDERED IN ED: Medications  morphine (PF) 4 MG/ML injection 4 mg (has no administration in time range)  promethazine (PHENERGAN) 12.5 mg in sodium chloride 0.9 % 50 mL IVPB (has no administration in time range)  lactated ringers bolus 1,000 mL (has no administration in time range)  cefTRIAXone (ROCEPHIN) 2 g in sodium chloride 0.9 % 100 mL IVPB (has no administration in time range)  doxycycline (VIBRAMYCIN) 100 mg in sodium chloride 0.9 % 250 mL IVPB (has no administration in time range)  ondansetron (ZOFRAN) injection 4 mg (4 mg Intravenous Given 12/10/22 1452)  iohexol (OMNIPAQUE) 300 MG/ML solution 100 mL (100 mLs Intravenous Contrast Given 12/10/22 1537)     IMPRESSION / MDM / ASSESSMENT AND PLAN / ED COURSE  I reviewed the triage vital signs and the nursing notes.                              62 y.o. male with past medical history of hypertension, CKD, COPD, and chronic pain syndrome who presents to the ED with ongoing abdominal pain and worsening nausea with vomiting since recent admission for large abdominal wall hematoma.  Patient's presentation is most consistent with acute presentation with potential threat to life or bodily function.  Differential diagnosis includes, but is not limited to, worsening hematoma, intra-abdominal bleeding, bowel obstruction, pancreatitis, hepatitis,  cholecystitis, biliary colic, UTI, electrolyte abnormality, AKI, anemia, pneumonia.  Patient uncomfortable appearing but nontoxic and in no acute distress, vital signs remarkable for tachycardia but otherwise reassuring.  Abdomen is soft but he does have significant tenderness over the left side with palpable hematoma.  Labs show leukocytosis but hemoglobin improved compared to previous.  Renal function stable compared to previous with no acute electrolyte abnormality, bilirubin mildly elevated but LFTs otherwise unremarkable, lipase within normal limits.  CT imaging performed and shows hematoma is slightly improved in size, otherwise no acute findings in his abdomen but he was noted to have infiltrate in  the right lower lobe.  He is likely developing pneumonia due to difficulty taking a deep breath, will start on Rocephin and doxycycline given his allergy to azithromycin.  Plan to treat symptomatically with IV morphine and Phenergan, case discussed with hospitalist for admission.      FINAL CLINICAL IMPRESSION(S) / ED DIAGNOSES   Final diagnoses:  Abdominal wall hematoma, subsequent encounter  Intractable vomiting with nausea  Pneumonia of right lower lobe due to infectious organism     Rx / DC Orders   ED Discharge Orders     None        Note:  This document was prepared using Dragon voice recognition software and may include unintentional dictation errors.   Chesley Noon, MD 12/10/22 [phone]

## 2022-12-10 NOTE — H&P (Signed)
History and Physical    Joseff Forseth ZOX:[id number] DOB: 06/12/1960 DOA: 12/10/2022  PCP: Kandyce Rud, MD  Patient coming from: home  I have personally briefly reviewed patient's old medical records in [facility]  Chief Complaint: n/v  HPI: Henry Owens is a 62 y.o. male with medical history significant of COPD, GERD, CKDIII,Depression , hypertension , Anxiety , Arthritis, periods of altered  mental status , Hx of severe pneumonia with cardio/pulmonary arrest. Patient also has interim history of admission on 8/4-8/8 with abdominal wall hematoma s/p fall. Patient also during that hospitalization had episode of altered mental status has mri /eeg noted to negative per neurology will need sleep study as outpatient.  Patient presents today with n/v. Of note symptoms ongoing around 8/12. Patient was seen on 8/15 due to persistent symptoms and had his zofran increased.  Due to continued symptoms patient presents to ED to day for further evaluation. Patient notes no fever/chills/ chest pain / sob/ cough. He does not diarrhea but noted no blood in stools. He continues to note persistent pain area of hematoma as well as with deep breathing.   ED Course:  Afeb , BP 151/110, hr 134, rr 18 , sat 99%  Wbc: 13.1, hgb 11.3 ( 8), plt 455,  Lipase 45 Na 137, K 3.3, cL 103, cr 1.48, alphos 195  Ctabd/pelvis IMPRESSION: 1. A 14 x 5 x 8.9 cm intramuscular hematoma in the left abdominal oblique musculature. Small amount of hemorrhage extending into the peritoneum and tracking along the lateral margin of the spleen. 2. Patchy ground-glass opacities at the right lung base which may be secondary to an infectious or inflammatory etiology. 3. Diffuse osteosclerosis which may be secondary to diffuse metastatic disease versus hyperparathyroidism, myelofibrosis or renal osteodystrophy amongst other etiologies. Overall unchanged compared with prior CT chest dated 07/05/2021.   Zofran 4mg   ctx/phenergan/morphine , LR Review of Systems: As per HPI otherwise 10 point review of systems negative.   Past Medical History:  Diagnosis Date   Anxiety    Arthritis    Flu 07/2017    Past Surgical History:  Procedure Laterality Date   APPENDECTOMY  1975   BACK SURGERY     KNEE ARTHROSCOPY WITH MEDIAL MENISECTOMY Right 08/20/2017   Procedure: KNEE ARTHROSCOPY WITH PARTIAL MEDIAL AND LATERAL MENISECTOMY,PARTIAL SYNOVECTOMY,CHONDROPLASTY, LOOSE BODY REMOVAL;  Surgeon: Lyndle Herrlich, MD;  Location: [facility] ORS;  Service: Orthopedics;  Laterality: Right;   KNEE SURGERY  1975   1996, 2000     reports that he has been smoking cigarettes. He has a 37.5 pack-year smoking history. He has never used smokeless tobacco. He reports that he does not currently use drugs. He reports that he does not drink alcohol.  Allergies  Allergen Reactions   Wound Dressing Adhesive Other (See Comments)    BLISTER   Azithromycin Rash   Penicillin G Rash    Has patient had a PCN reaction causing immediate rash, facial/tongue/throat swelling, SOB or lightheadedness with hypotension: Yes Has patient had a PCN reaction causing severe rash involving mucus membranes or skin necrosis: No Has patient had a PCN reaction that required hospitalization: No Has patient had a PCN reaction occurring within the last 10 years: No If all of the above answers are "NO", then may proceed with Cephalosporin use.     Family History  Problem Relation Age of Onset   Bladder Cancer Neg Hx    Prostate cancer Neg Hx    Hematuria Neg Hx  Prior to Admission medications   Medication Sig Start Date End Date Taking? Authorizing Provider  acetaminophen (TYLENOL) 500 MG tablet Take 1,000 mg by mouth in the morning, at noon, and at bedtime.    [provider]  ALPRAZolam Prudy Feeler) 0.25 MG tablet Take 0.25 mg by mouth 2 (two) times daily as needed for anxiety or sleep.    [provider]  amLODipine (NORVASC) 5 MG  tablet Take 5 mg by mouth daily. 09/29/22 09/29/23  [provider]  atenolol (TENORMIN) 50 MG tablet Take 25 mg by mouth daily. 08/31/22 08/31/23  [provider]  atorvastatin (LIPITOR) 10 MG tablet Take 10 mg by mouth every evening. 09/29/22 09/29/23  [provider]  budesonide-formoterol (SYMBICORT) 80-4.5 MCG/ACT inhaler Inhale 2 puffs into the lungs in the morning and at bedtime.    [provider]  celecoxib (CELEBREX) 100 MG capsule Take 100 mg by mouth 2 (two) times daily. 11/24/22   [provider]  fluticasone (FLONASE) 50 MCG/ACT nasal spray Place 1 spray into both nostrils daily as needed for allergies or rhinitis.    [provider]  gabapentin (NEURONTIN) 600 MG tablet Take 900 mg by mouth 2 (two) times daily.    [provider]  lisinopril (ZESTRIL) 10 MG tablet Take 10 mg by mouth daily.    [provider]  naloxone [facility]) nasal spray 4 mg/0.1 mL As needed For opioid reversal (unresponsiveness) intranasal spray Patient not taking: Reported on 11/27/2022 10/12/21   Alford Highland, MD  oxyCODONE 10 MG TABS Take 0.5-1 tablets (5-10 mg total) by mouth every 6 (six) hours as needed for severe pain or moderate pain. 11/30/22   Meuth, Brooke A, PA-C  pantoprazole (PROTONIX) 40 MG tablet Take 1 tablet (40 mg total) by mouth daily. 07/11/21   Darlin Priestly, MD  polyethylene glycol (MIRALAX / GLYCOLAX) 17 g packet Take 17 g by mouth daily as needed (constipation). 11/30/22   Meuth, Brooke A, PA-C  tiZANidine (ZANAFLEX) 4 MG tablet Take 4 mg by mouth 3 (three) times daily. 11/24/22   [provider]  venlafaxine XR (EFFEXOR-XR) 150 MG 24 hr capsule Take 150 mg by mouth daily with breakfast. 09/29/22   [provider]    Physical Exam: Vitals:   12/10/22 1444  BP: (!) 151/110  Pulse: (!) 134  Resp: 18  Temp: 98.6 F (37 C)  TempSrc: Oral  SpO2: 99%  Weight: 87.8 kg  Height: 5\' 10"  (1.778 m)    Constitutional: NAD,  calm, appears uncomfortable Vitals:   12/10/22 1444  BP: (!) 151/110  Pulse: (!) 134  Resp: 18  Temp: 98.6 F (37 C)  TempSrc: Oral  SpO2: 99%  Weight: 87.8 kg  Height: 5\' 10"  (1.778 m)   Eyes: PERRL, lids and conjunctivae normal ENMT: Mucous membranes are dry. Posterior pharynx clear of any exudate or lesions.Normal dentition.  Neck: normal, supple, no masses, no thyromegaly Respiratory: clear to auscultation bilaterally, no wheezing, no crackles. Normal respiratory effort. No accessory muscle use.  Cardiovascular: Regular rate and rhythm, no murmurs / rubs / gallops. No extremity edema. 2+ pedal pulses.   Abdomen: +tenderness, left side of abdomen notes area of late states healing of prior hematoma, redness as resolved completely,No hepatosplenomegaly. Bowel sounds positive.  Musculoskeletal: no clubbing / cyanosis. No joint deformity upper and lower extremities. Good ROM, no contractures. Normal muscle tone.  Skin: no rashes, lesions, ulcers. No induration Neurologic: CN 2-12 grossly intact. Sensation intact, . Strength 5/5  in all 4.  Psychiatric: Normal judgment and insight. Alert and oriented x 3. Normal mood.    Labs on Admission: I have personally reviewed following labs and imaging studies  CBC: Recent Labs  Lab 12/10/22 1446  WBC 13.1*  HGB 11.3*  HCT 34.7*  MCV 97.2  PLT 455*   Basic Metabolic Panel: Recent Labs  Lab 12/10/22 1446 12/10/22 1459  NA 137  --   K 3.3*  --   CL 103  --   CO2 20*  --   GLUCOSE 171*  --   BUN 12  --   CREATININE 1.48*  --   CALCIUM 9.7  --   MG  --  2.4   GFR: Estimated Creatinine Clearance: 58.5 mL/min (A) (by C-G formula based on SCr of 1.48 mg/dL (H)). Liver Function Tests: Recent Labs  Lab 12/10/22 1446  AST 22  ALT 19  ALKPHOS 195*  BILITOT 1.5*  PROT 8.2*  ALBUMIN 4.5   Recent Labs  Lab 12/10/22 1446  LIPASE 45   No results for input(s): "AMMONIA" in the last 168 hours. Coagulation Profile: No results  for input(s): "INR", "PROTIME" in the last 168 hours. Cardiac Enzymes: No results for input(s): "CKTOTAL", "CKMB", "CKMBINDEX", "TROPONINI" in the last 168 hours. BNP (last 3 results) No results for input(s): "PROBNP" in the last 8760 hours. HbA1C: No results for input(s): "HGBA1C" in the last 72 hours. CBG: No results for input(s): "GLUCAP" in the last 168 hours. Lipid Profile: No results for input(s): "CHOL", "HDL", "LDLCALC", "TRIG", "CHOLHDL", "LDLDIRECT" in the last 72 hours. Thyroid Function Tests: No results for input(s): "TSH", "T4TOTAL", "FREET4", "T3FREE", "THYROIDAB" in the last 72 hours. Anemia Panel: No results for input(s): "VITAMINB12", "FOLATE", "FERRITIN", "TIBC", "IRON", "RETICCTPCT" in the last 72 hours. Urine analysis:    Component Value Date/Time   COLORURINE COLORLESS (A) 02/06/2022 1648   APPEARANCEUR CLEAR (A) 02/06/2022 1648   LABSPEC 1.008 02/06/2022 1648   PHURINE 5.0 02/06/2022 1648   GLUCOSEU NEGATIVE 02/06/2022 1648   HGBUR NEGATIVE 02/06/2022 1648   BILIRUBINUR NEGATIVE 02/06/2022 1648   KETONESUR NEGATIVE 02/06/2022 1648   PROTEINUR NEGATIVE 02/06/2022 1648   NITRITE NEGATIVE 02/06/2022 1648   LEUKOCYTESUR NEGATIVE 02/06/2022 1648    Radiological Exams on Admission: CT ABDOMEN PELVIS W CONTRAST  Result Date: 12/10/2022 CLINICAL DATA:  Abdominal pain EXAM: CT ABDOMEN AND PELVIS WITH CONTRAST TECHNIQUE: Multidetector CT imaging of the abdomen and pelvis was performed using the standard protocol following bolus administration of intravenous contrast. RADIATION DOSE REDUCTION: This exam was performed according to the departmental dose-optimization program which includes automated exposure control, adjustment of the mA and/or kV according to patient size and/or use of iterative reconstruction technique. CONTRAST:  OMNIPAQUE IOHEXOL 300 MG/ML  SOLN COMPARISON:  None Available. FINDINGS: Lower chest: Patchy ground-glass opacities at the right lung base  which may be secondary to an infectious or inflammatory etiology. Hepatobiliary: 4 mm left hepatic cyst. No other focal hepatic lesion. Distended gallbladder. No gallstones, gallbladder wall thickening, or biliary dilatation. Pancreas: Unremarkable. No pancreatic ductal dilatation or surrounding inflammatory changes. Spleen: Normal in size without focal abnormality. Adrenals/Urinary Tract: Adrenal glands are unremarkable. Kidneys are normal, without renal calculi, focal lesion, or hydronephrosis. Bladder is unremarkable. Stomach/Bowel: Stomach is within normal limits. No evidence of bowel wall thickening, distention, or inflammatory changes. Vascular/Lymphatic: No significant vascular findings are present. No enlarged abdominal or pelvic lymph nodes. Reproductive: Prostate is unremarkable. Other: No abdominal wall hernia or abnormality. No abdominopelvic  ascites. Musculoskeletal: 14 x 5 x 8.9 cm intramuscular hematoma in the left abdominal oblique musculature. Small amount of hemorrhage extending into the peritoneum and tracking along the lateral margin of the spleen. No acute osseous abnormality. No aggressive osseous lesion. Mild osteoarthritis of the hips bilaterally. Diffuse osteosclerosis which may be secondary to diffuse metastatic disease versus hyperparathyroidism, myelofibrosis or renal osteodystrophy. Posterior lumbar fusion from L4 through S1. IMPRESSION: 1. A 14 x 5 x 8.9 cm intramuscular hematoma in the left abdominal oblique musculature. Small amount of hemorrhage extending into the peritoneum and tracking along the lateral margin of the spleen. 2. Patchy ground-glass opacities at the right lung base which may be secondary to an infectious or inflammatory etiology. 3. Diffuse osteosclerosis which may be secondary to diffuse metastatic disease versus hyperparathyroidism, myelofibrosis or renal osteodystrophy amongst other etiologies. Overall unchanged compared with prior CT chest dated 07/05/2021.  Electronically Signed   By: Elige Ko M.D.   On: 12/10/2022 16:19    EKG: Independently reviewed.   Assessment/Plan CAP, possible aspiration -cefepime/ azithromycin, de-escalate as able  -pulmonary toilet  -urine ag, sputum, f/u on culture data    Refractory n/v /d/ ?Acute Gastroenteritis nos -ct abd pelvis no new acute findings  -supportive care with antiemetic ivfs -stool studies pending  Persistent pain  -ct ab negative  -supportive care   Hypokalemia -replete prn   Hx of Periods of altered  mental status  -last admission , eeg, mri negative  -neuro consult recommended out patient sleep study -patient currently follow with out patient neurology as well   COPD  -no acute exacerbation currently  -resume home regimen   GERG -ppi   CKDIII -at baseline   Depression Anxiety  -resume home regimen once med rec completed    Hypertension  -resume home regimen   DVT prophylaxis: scd Code Status: full/ as discussed per patient wishes in event of cardiac arrest  Family Communication:  Disposition Plan: patient  expected to be admitted greater than 2 midnights  Consults called: n/a Admission status: med tele   Lurline Del MD [facility]   If 7PM-7AM, please contact night-coverage [email or web address] Password [facility]  12/10/2022, 5:03 PM

## 2022-12-10 NOTE — ED Triage Notes (Signed)
Arrives from home via [facility]. C?O vomiting x past several days.  Has taken Zofran, not helping.  Recent internal bleeding, tx to [facility].  20 g LAC, Zofran given.  Bruising to trunk from a fall a couple days ago.  D/C [facility] a couple of days ago.  188/158 -- HTN, has not taken meds due to nausea 98% RA 11 CBG:  123

## 2022-12-10 NOTE — Progress Notes (Signed)
CODE SEPSIS - PHARMACY COMMUNICATION  **Broad Spectrum Antibiotics should be administered within 1 hour of Sepsis diagnosis**  Time Code Sepsis Called/Page Received: 1654  Antibiotics Ordered: ceftriaxone and doxycycline  Time of 1st antibiotic administration: 1730  Additional action taken by pharmacy: non  If necessary, Name of Provider/Nurse Contacted: n/a    Elliot Gurney, PharmD, BCPS Clinical Pharmacist  12/10/2022 4:55 PM

## 2022-12-10 NOTE — Progress Notes (Signed)
Elink following for sepsis protocol. 

## 2022-12-11 ENCOUNTER — Inpatient Hospital Stay: Payer: Medicaid Other

## 2022-12-11 ENCOUNTER — Encounter: Payer: Self-pay | Admitting: Internal Medicine

## 2022-12-11 DIAGNOSIS — N1831 Chronic kidney disease, stage 3a: Secondary | ICD-10-CM | POA: Diagnosis not present

## 2022-12-11 DIAGNOSIS — R112 Nausea with vomiting, unspecified: Secondary | ICD-10-CM | POA: Diagnosis not present

## 2022-12-11 DIAGNOSIS — E663 Overweight: Secondary | ICD-10-CM | POA: Insufficient documentation

## 2022-12-11 DIAGNOSIS — R109 Unspecified abdominal pain: Secondary | ICD-10-CM | POA: Diagnosis not present

## 2022-12-11 DIAGNOSIS — J189 Pneumonia, unspecified organism: Secondary | ICD-10-CM | POA: Diagnosis not present

## 2022-12-11 DIAGNOSIS — E876 Hypokalemia: Secondary | ICD-10-CM | POA: Insufficient documentation

## 2022-12-11 HISTORY — DX: Nausea with vomiting, unspecified: R11.2

## 2022-12-11 LAB — COMPREHENSIVE METABOLIC PANEL
ALT: 15 U/L (ref 0–44)
AST: 15 U/L (ref 15–41)
Albumin: 3.4 g/dL — ABNORMAL LOW (ref 3.5–5.0)
Alkaline Phosphatase: 150 U/L — ABNORMAL HIGH (ref 38–126)
Anion gap: 5 (ref 5–15)
BUN: 11 mg/dL (ref 8–23)
CO2: 26 mmol/L (ref 22–32)
Calcium: 8.6 mg/dL — ABNORMAL LOW (ref 8.9–10.3)
Chloride: 106 mmol/L (ref 98–111)
Creatinine, Ser: 1.39 mg/dL — ABNORMAL HIGH (ref 0.61–1.24)
GFR, Estimated: 58 mL/min — ABNORMAL LOW (ref >60)
Glucose, Bld: 128 mg/dL — ABNORMAL HIGH (ref 70–99)
Potassium: 3 mmol/L — ABNORMAL LOW (ref 3.5–5.1)
Sodium: 137 mmol/L (ref 135–145)
Total Bilirubin: 1 mg/dL (ref 0.3–1.2)
Total Protein: 6.2 g/dL — ABNORMAL LOW (ref 6.5–8.1)

## 2022-12-11 LAB — CBC
HCT: 23.7 % — ABNORMAL LOW (ref 39.0–52.0)
Hemoglobin: 8.2 g/dL — ABNORMAL LOW (ref 13.0–17.0)
MCH: 33.2 pg (ref 26.0–34.0)
MCHC: 34.6 g/dL (ref 30.0–36.0)
MCV: 96 fL (ref 80.0–100.0)
Platelets: 222 10*3/uL (ref 150–400)
RBC: 2.47 MIL/uL — ABNORMAL LOW (ref 4.22–5.81)
RDW: 16.3 % — ABNORMAL HIGH (ref 11.5–15.5)
WBC: 5.6 10*3/uL (ref 4.0–10.5)
nRBC: 1.1 % — ABNORMAL HIGH (ref 0.0–0.2)

## 2022-12-11 LAB — URINE DRUG SCREEN, QUALITATIVE (ARMC ONLY)
Amphetamines, Ur Screen: NOT DETECTED
Barbiturates, Ur Screen: NOT DETECTED
Benzodiazepine, Ur Scrn: POSITIVE — AB
Cannabinoid 50 Ng, Ur ~~LOC~~: POSITIVE — AB
Cocaine Metabolite,Ur ~~LOC~~: NOT DETECTED
MDMA (Ecstasy)Ur Screen: NOT DETECTED
Methadone Scn, Ur: NOT DETECTED
Opiate, Ur Screen: POSITIVE — AB
Phencyclidine (PCP) Ur S: NOT DETECTED
Tricyclic, Ur Screen: NOT DETECTED

## 2022-12-11 LAB — FERRITIN: Ferritin: 345 ng/mL — ABNORMAL HIGH (ref 24–336)

## 2022-12-11 LAB — PROCALCITONIN: Procalcitonin: 0.36 ng/mL

## 2022-12-11 LAB — IRON AND TIBC
Iron: 109 ug/dL (ref 45–182)
Saturation Ratios: 55 % — ABNORMAL HIGH (ref 17.9–39.5)
TIBC: 199 ug/dL — ABNORMAL LOW (ref 250–450)
UIBC: 90 ug/dL

## 2022-12-11 LAB — MAGNESIUM: Magnesium: 2.3 mg/dL (ref 1.7–2.4)

## 2022-12-11 LAB — VITAMIN B12: Vitamin B-12: 274 pg/mL (ref 180–914)

## 2022-12-11 MED ORDER — POTASSIUM CHLORIDE 10 MEQ/100ML IV SOLN
10.0000 meq | INTRAVENOUS | Status: AC
  Administered 2022-12-11 (×2): 10 meq via INTRAVENOUS
  Filled 2022-12-11 (×2): qty 100

## 2022-12-11 MED ORDER — SODIUM CHLORIDE 0.9 % IV SOLN: INTRAVENOUS | Status: DC

## 2022-12-11 MED ORDER — PROMETHAZINE (PHENERGAN) 6.25MG IN NS 50ML IVPB
6.2500 mg | Freq: Four times a day (QID) | INTRAVENOUS | Status: DC | PRN
  Administered 2022-12-11 (×3): 6.25 mg via INTRAVENOUS
  Filled 2022-12-11: qty 6.25
  Filled 2022-12-11: qty 50
  Filled 2022-12-11: qty 6.25

## 2022-12-11 MED ORDER — METOCLOPRAMIDE HCL 5 MG/ML IJ SOLN
10.0000 mg | Freq: Three times a day (TID) | INTRAMUSCULAR | Status: DC
  Administered 2022-12-11 – 2022-12-12 (×3): 10 mg via INTRAVENOUS
  Filled 2022-12-11 (×3): qty 2

## 2022-12-11 MED ORDER — PANTOPRAZOLE SODIUM 40 MG PO TBEC
40.0000 mg | DELAYED_RELEASE_TABLET | Freq: Two times a day (BID) | ORAL | Status: DC
  Administered 2022-12-12 – 2022-12-14 (×5): 40 mg via ORAL
  Filled 2022-12-11 (×5): qty 1

## 2022-12-11 MED ORDER — POTASSIUM CHLORIDE 10 MEQ/100ML IV SOLN
10.0000 meq | Freq: Once | INTRAVENOUS | Status: AC
  Administered 2022-12-11: 10 meq via INTRAVENOUS

## 2022-12-11 MED ORDER — SODIUM CHLORIDE 0.9 % IV SOLN
6.2500 mg | Freq: Four times a day (QID) | INTRAVENOUS | Status: DC | PRN
  Administered 2022-12-11: 6.25 mg via INTRAVENOUS
  Filled 2022-12-11: qty 0.25

## 2022-12-11 MED ORDER — POTASSIUM CHLORIDE CRYS ER 20 MEQ PO TBCR
40.0000 meq | EXTENDED_RELEASE_TABLET | ORAL | Status: DC
  Filled 2022-12-11: qty 2

## 2022-12-11 MED ORDER — POTASSIUM CHLORIDE 10 MEQ/100ML IV SOLN
10.0000 meq | INTRAVENOUS | Status: AC
  Administered 2022-12-11: 10 meq via INTRAVENOUS
  Filled 2022-12-11: qty 100

## 2022-12-11 MED ORDER — SUCRALFATE 1 G PO TABS
1.0000 g | ORAL_TABLET | Freq: Three times a day (TID) | ORAL | Status: DC
  Administered 2022-12-12: 1 g via ORAL
  Filled 2022-12-11 (×2): qty 1

## 2022-12-11 MED ORDER — BOOST / RESOURCE BREEZE PO LIQD CUSTOM: 1.0000 | Freq: Three times a day (TID) | ORAL | Status: DC

## 2022-12-11 MED ORDER — SODIUM CHLORIDE 0.9 % IV SOLN
3.0000 g | Freq: Four times a day (QID) | INTRAVENOUS | Status: DC
  Administered 2022-12-11 – 2022-12-13 (×7): 3 g via INTRAVENOUS
  Filled 2022-12-11 (×8): qty 8

## 2022-12-11 MED ORDER — POTASSIUM CHLORIDE 10 MEQ/100ML IV SOLN: 10.0000 meq | INTRAVENOUS | Status: AC

## 2022-12-11 NOTE — Plan of Care (Signed)
  Problem: Respiratory: Goal: Ability to maintain adequate ventilation will improve Outcome: Progressing   Problem: Elimination: Goal: Will not experience complications related to bowel motility Outcome: Progressing   Problem: Nutrition: Goal: Adequate nutrition will be maintained Outcome: Not Progressing   Problem: Pain Managment: Goal: General experience of comfort will improve Outcome: Not Progressing  Patient having constant abdominal pain and nausea. PRN pain and nausea medications given when appropriate, but only slightly help. MD updated.   Madie Reno, RN

## 2022-12-11 NOTE — Hospital Course (Signed)
Henry Owens is a 62 y.o. male with medical history significant of COPD, GERD, CKDIII,Depression , hypertension , Anxiety , Arthritis, periods of altered  mental status , Hx of severe pneumonia with cardio/pulmonary arrest. Patient also has interim history of admission on 8/4-8/8 with abdominal wall hematoma s/p fall.  He came back to the hospital complaining of intractable nausea vomiting for the last 2 weeks. CT scan showed left abdominal hematoma has reduced in size, but showed patchy infiltrates in the right lower base.  Patient is placed on antibiotics for pneumonia. 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  Nausea vomiting gradually improving.  Patient has intermittent profuse sweating.  Consistent with cannabis withdrawal. Patient seems to tolerate a diet on 8/21, advance diet.  Also on Neurontin. Patient condition much improved today, tolerating soft diet, no nausea vomiting.  Still has some loose stools.  No additional episodes of diaphoresis.  Medically stable for discharge.

## 2022-12-11 NOTE — Progress Notes (Signed)
  Progress Note   Patient: Henry Owens UJW:[id number] DOB: 04-Sep-1960 DOA: 12/10/2022     1 DOS: the patient was seen and examined on 12/11/2022   Brief hospital course: Jamicheal Glunz is a 62 y.o. male with medical history significant of COPD, GERD, CKDIII,Depression , hypertension , Anxiety , Arthritis, periods of altered  mental status , Hx of severe pneumonia with cardio/pulmonary arrest. Patient also has interim history of admission on 8/4-8/8 with abdominal wall hematoma s/p fall.  He came back to the hospital complaining of intractable nausea vomiting for the last 2 weeks. CT scan showed left abdominal hematoma has reduced in size, but showed patchy infiltrates in the right lower base.  Patient is placed on antibiotics for pneumonia.   Principal Problem:   CAP (community acquired pneumonia) Active Problems:   CKD stage 3a, GFR 45-59 ml/min (HCC)   Hypokalemia   Overweight (BMI 25.0-29.9)   Intractable nausea and vomiting   Assessment and Plan: Retractable nausea vomiting. Patient states that he does not have significant diarrhea, only very little loose stools.  He also has a very poor appetite. CT scan of the abdomen/pelvis did not show any evidence of acute abdomen.  However, patient stomach feels pretty firm, will repeat KUB. Consider possibility of gastroparesis.  Will give Reglan in addition to Protonix and sucralfate.  Bilateral lower lobe aspiration pneumonia. Patient has minimal elevation of procalcitonin level, most likely due to aspiration from nausea vomiting.  Antibiotic changed to Unasyn x 5 days.  Abdominal wall hematoma. Continue to hold anticoagulation.  Acute metabolic encephalopathy. Mental status better today.  COPD. Stable.  Chronic kidney disease stage IIIa. Hypokalemia. Renal function stable. Given IV potassium as patient tolerated oral potassium. Recheck BMP magnesium tomorrow.      Subjective:  Patient still complain  intermittent abdominal pain, intractable nausea vomiting.  Poor p.o. intake. Denies any short of breath or cough.  Physical Exam: Vitals:   12/11/22 0123 12/11/22 0422 12/11/22 0826 12/11/22 1630  BP: 106/74 (!) 144/77 (!) 167/77 (!) 150/81  Pulse: 69 78 80 95  Resp: 20 20 20 16   Temp: 97.8 F (36.6 C) 99.5 F (37.5 C) 98.4 F (36.9 C) 97.8 F (36.6 C)  TempSrc: Oral Oral Oral Oral  SpO2: 100% 99% 100% 100%  Weight:  85 kg    Height:       General exam: Appears calm and comfortable  Respiratory system: Clear to auscultation. Respiratory effort normal. Cardiovascular system: S1 & S2 heard, RRR. No JVD, murmurs, rubs, gallops or clicks. No pedal edema. Gastrointestinal system: Abdomen is nondistended, firm and diffuse tender. No organomegaly or masses felt.  Super active bowel sounds. Central nervous system: Alert and oriented. No focal neurological deficits. Extremities: Symmetric 5 x 5 power. Skin: No rashes, lesions or ulcers Psychiatry: Judgement and insight appear normal. Mood & affect appropriate.    Data Reviewed:  Reviewed CT scan results, lab results.  Family Communication: Wife updated at bedside.  Disposition: Status is: Inpatient Remains inpatient appropriate because: Severity of disease, IV treatment.     Time spent: 35 minutes  Author: Marrion Coy, MD 12/11/2022 4:50 PM  For on call review [email or web address].

## 2022-12-11 NOTE — TOC Initial Note (Signed)
Transition of Care [facility]) - Initial/Assessment Note    Patient Details  Name: Henry Owens MRN: [id number] Date of Birth: [birth year]  Transition of Care [facility]) CM/SW Contact:    Truddie Hidden, RN Phone Number: 12/11/2022, 12:35 PM  Clinical Narrative:                 Admitted for: CAP  Admitted from: Home with spouse and son Pharmacy: [facility] [place], [facility] [address]  Current home health/prior home health/DME: [facility] via [employer]  Transporation: His wife will transport    Expected Discharge Plan: Home/Self Care Barriers to Discharge: Continued Medical Work up   Patient Goals and CMS Choice Patient states their goals for this hospitalization and ongoing recovery are:: Return home          Expected Discharge Plan and Services       Living arrangements for the past 2 months: Single Family Home                                      Prior Living Arrangements/Services Living arrangements for the past 2 months: Single Family Home Lives with:: Spouse, Adult Children Patient language and need for interpreter reviewed:: Yes Do you feel safe going back to the place where you live?: Yes      Need for Family Participation in Patient Care: Yes (Comment) Care giver support system in place?: Yes (comment)   Criminal Activity/Legal Involvement Pertinent to Current Situation/Hospitalization: No - Comment as needed  Activities of Daily Living Home Assistive Devices/Equipment: Dentures (specify type), Oxygen, Cane (specify quad or straight) ADL Screening (condition at time of admission) Patient's cognitive ability adequate to safely complete daily activities?: Yes Is the patient deaf or have difficulty hearing?: No Does the patient have difficulty seeing, even when wearing glasses/contacts?: No Does the patient have difficulty concentrating, remembering, or making decisions?: No Patient able to express need for assistance with ADLs?: Yes Does the patient have  difficulty dressing or bathing?: Yes Independently performs ADLs?: No Communication: Independent Dressing (OT): Needs assistance Is this a change from baseline?: Change from baseline, expected to last <3days Grooming: Independent Feeding: Independent Bathing: Needs assistance Is this a change from baseline?: Change from baseline, expected to last <3 days Toileting: Needs assistance Is this a change from baseline?: Change from baseline, expected to last <3 days In/Out Bed: Needs assistance Is this a change from baseline?: Change from baseline, expected to last <3 days Walks in Home: Independent Does the patient have difficulty walking or climbing stairs?: No Weakness of Legs: None Weakness of Arms/Hands: None  Permission Sought/Granted                  Emotional Assessment Appearance:: Other (Comment Required Attitude/Demeanor/Rapport: Gracious, Engaged Affect (typically observed): Accepting Orientation: : Oriented to Self, Oriented to Place, Oriented to  Time, Oriented to Situation Alcohol / Substance Use: Not Applicable Psych Involvement: No (comment)  Admission diagnosis:  CAP (community acquired pneumonia) [J18.9] Abdominal wall hematoma, subsequent encounter [S30.1XXD] Intractable vomiting with nausea [R11.2] Pneumonia of right lower lobe due to infectious organism [J18.9] Patient Active Problem List   Diagnosis Date Noted   Hypokalemia 12/11/2022   Overweight (BMI 25.0-29.9) 12/11/2022   Intractable nausea and vomiting 12/11/2022   CAP (community acquired pneumonia) 12/10/2022   Fall 11/26/2022   COPD with acute exacerbation (HCC) 02/07/2022   Weakness 02/07/2022   Thrombocytopenia (HCC) 02/07/2022   Essential  hypertension 02/06/2022   Hyponatremia 02/06/2022   CKD stage 3a, GFR 45-59 ml/min (HCC) 02/06/2022   Sepsis (HCC) 11/10/2021   AKI (acute kidney injury) (HCC) 11/10/2021   Acute confusion 11/10/2021   Chronic, continuous use of opioids 11/10/2021    Multifocal pneumonia 10/12/2021   Right upper quadrant pain 10/12/2021   Constipation 10/12/2021   Chronic pain 10/12/2021   Elevated troponin 10/12/2021   Acute respiratory failure with hypoxia and hypercapnia (HCC) 10/09/2021   Opiate overdose (HCC)    Unresponsive state    Acute respiratory failure due to COVID-19 (HCC) 07/05/2021   Current smoker 04/01/2021   Abnormal bone marrow examination 05/01/2020   PCP:  Kandyce Rud, MD Pharmacy:   [id number] [place], [place] - [address] AT [facility] OF [address] [place] [address] Phone: [phone] Fax: [phone]     Social Determinants of Health (SDOH) Social History: SDOH Screenings   Food Insecurity: No Food Insecurity (12/11/2022)  Housing: Low Risk  (12/11/2022)  Transportation Needs: No Transportation Needs (12/11/2022)  Utilities: Not At Risk (12/11/2022)  Financial Resource Strain: Patient Declined (06/26/2022)   Received from [facility], [facility]  Tobacco Use: Medium Risk (12/07/2022)   Received from [facility]   SDOH Interventions:     Readmission Risk Interventions    12/11/2022   12:22 PM 07/10/2021    2:37 PM  Readmission Risk Prevention Plan  Transportation Screening Complete Complete  PCP or Specialist Appt within 5-7 Days  Complete  PCP or Specialist Appt within 3-5 Days Complete   Home Care Screening  Complete  Medication Review (RN CM)  Complete  HRI or Home Care Consult Complete   Social Work Consult for Recovery Care Planning/Counseling Complete   Palliative Care Screening Not Applicable   Medication Review Oceanographer) Complete

## 2022-12-11 NOTE — Procedures (Signed)
Patient Name: Henry Owens  MRN: [id number]  Epilepsy Attending: Charlsie Quest  Referring Physician/Provider:  Ritta Slot MD  Duration: 11/29/2022 0730 to 11/29/2022 0950   Patient history: 62 yo patient with spells who is undergoing EEG to evaluate for seizures.    Level of alertness: Awake, asleep   AEDs during EEG study: GBP, Xanax   Technical aspects: This EEG study was done with scalp electrodes positioned according to the 10-20 International system of electrode placement. Electrical activity was reviewed with band pass filter of 1-70Hz , sensitivity of 7 uV/mm, display speed of 57mm/sec with a 60Hz  notched filter applied as appropriate. EEG data were recorded continuously and digitally stored.  Video monitoring was available and reviewed as appropriate.   Description: The posterior dominant rhythm consists of 6-7 Hz activity of moderate voltage (25-35 uV) seen predominantly in posterior head regions, symmetric and reactive to eye opening and eye closing. Sleep was characterized by vertex waves, sleep spindles (12 to 14 Hz), maximal frontocentral region.  EEG showed continuous generalized 5 to 7 Hz theta slowing. Hyperventilation and photic stimulation were not performed.     ABNORMALITY - Continuous slow, generalized   IMPRESSION: This study is suggestive of moderate diffuse encephalopathy, nonspecific etiology. No seizures or epileptiform discharges were seen throughout the recording.     Roberto Hlavaty [name]

## 2022-12-11 NOTE — Progress Notes (Signed)
Patient had CKD 3b

## 2022-12-12 ENCOUNTER — Inpatient Hospital Stay: Payer: Medicaid Other

## 2022-12-12 DIAGNOSIS — F1290 Cannabis use, unspecified, uncomplicated: Secondary | ICD-10-CM | POA: Diagnosis not present

## 2022-12-12 DIAGNOSIS — J690 Pneumonitis due to inhalation of food and vomit: Secondary | ICD-10-CM

## 2022-12-12 DIAGNOSIS — R112 Nausea with vomiting, unspecified: Secondary | ICD-10-CM | POA: Insufficient documentation

## 2022-12-12 DIAGNOSIS — N1831 Chronic kidney disease, stage 3a: Secondary | ICD-10-CM | POA: Diagnosis not present

## 2022-12-12 LAB — GLUCOSE, CAPILLARY
Glucose-Capillary: 116 mg/dL — ABNORMAL HIGH (ref 70–99)
Glucose-Capillary: 119 mg/dL — ABNORMAL HIGH (ref 70–99)

## 2022-12-12 LAB — BASIC METABOLIC PANEL
Anion gap: 12 (ref 5–15)
Anion gap: 12 (ref 5–15)
BUN: 6 mg/dL — ABNORMAL LOW (ref 8–23)
BUN: 7 mg/dL — ABNORMAL LOW (ref 8–23)
CO2: 23 mmol/L (ref 22–32)
CO2: 24 mmol/L (ref 22–32)
Calcium: 8.7 mg/dL — ABNORMAL LOW (ref 8.9–10.3)
Calcium: 9.2 mg/dL (ref 8.9–10.3)
Chloride: 103 mmol/L (ref 98–111)
Chloride: 103 mmol/L (ref 98–111)
Creatinine, Ser: 1.03 mg/dL (ref 0.61–1.24)
Creatinine, Ser: 1.03 mg/dL (ref 0.61–1.24)
GFR, Estimated: >60 mL/min (ref >60)
GFR, Estimated: >60 mL/min (ref >60)
Glucose, Bld: 118 mg/dL — ABNORMAL HIGH (ref 70–99)
Glucose, Bld: 121 mg/dL — ABNORMAL HIGH (ref 70–99)
Potassium: 3.3 mmol/L — ABNORMAL LOW (ref 3.5–5.1)
Potassium: 3.7 mmol/L (ref 3.5–5.1)
Sodium: 138 mmol/L (ref 135–145)
Sodium: 139 mmol/L (ref 135–145)

## 2022-12-12 LAB — GASTROINTESTINAL PANEL BY PCR, STOOL (REPLACES STOOL CULTURE)

## 2022-12-12 LAB — CBC
HCT: 29.6 % — ABNORMAL LOW (ref 39.0–52.0)
Hemoglobin: 9.8 g/dL — ABNORMAL LOW (ref 13.0–17.0)
MCH: 32 pg (ref 26.0–34.0)
MCHC: 33.1 g/dL (ref 30.0–36.0)
MCV: 96.7 fL (ref 80.0–100.0)
Platelets: 292 10*3/uL (ref 150–400)
RBC: 3.06 MIL/uL — ABNORMAL LOW (ref 4.22–5.81)
RDW: 16.5 % — ABNORMAL HIGH (ref 11.5–15.5)
WBC: 7.8 10*3/uL (ref 4.0–10.5)
nRBC: 0.3 % — ABNORMAL HIGH (ref 0.0–0.2)

## 2022-12-12 LAB — SARS CORONAVIRUS 2 BY RT PCR: SARS Coronavirus 2 by RT PCR: NEGATIVE

## 2022-12-12 LAB — PROCALCITONIN: Procalcitonin: 0.23 ng/mL

## 2022-12-12 LAB — C DIFFICILE QUICK SCREEN W PCR REFLEX
C Diff antigen: NEGATIVE
C Diff interpretation: NOT DETECTED
C Diff toxin: NEGATIVE

## 2022-12-12 LAB — MAGNESIUM: Magnesium: 2.2 mg/dL (ref 1.7–2.4)

## 2022-12-12 MED ORDER — ADULT MULTIVITAMIN W/MINERALS CH
1.0000 | ORAL_TABLET | Freq: Every day | ORAL | Status: DC
  Administered 2022-12-13 – 2022-12-14 (×2): 1 via ORAL
  Filled 2022-12-12 (×2): qty 1

## 2022-12-12 MED ORDER — ONDANSETRON HCL 4 MG/2ML IJ SOLN
4.0000 mg | Freq: Four times a day (QID) | INTRAMUSCULAR | Status: AC
  Administered 2022-12-12 – 2022-12-13 (×4): 4 mg via INTRAVENOUS
  Filled 2022-12-12 (×4): qty 2

## 2022-12-12 MED ORDER — HALOPERIDOL LACTATE 5 MG/ML IJ SOLN: 2.0000 mg | Freq: Four times a day (QID) | INTRAMUSCULAR | Status: DC | PRN

## 2022-12-12 MED ORDER — OXYCODONE-ACETAMINOPHEN 7.5-325 MG PO TABS
1.0000 | ORAL_TABLET | ORAL | Status: DC | PRN
  Administered 2022-12-12 – 2022-12-14 (×8): 1 via ORAL
  Filled 2022-12-12 (×8): qty 1

## 2022-12-12 MED ORDER — POTASSIUM CHLORIDE 2 MEQ/ML IV SOLN
INTRAVENOUS | Status: DC
  Filled 2022-12-12 (×4): qty 1000

## 2022-12-12 MED ORDER — PROMETHAZINE HCL 25 MG PO TABS: 12.5000 mg | ORAL_TABLET | ORAL | Status: DC | PRN

## 2022-12-12 MED ORDER — SODIUM CHLORIDE 0.9 % IV BOLUS
500.0000 mL | Freq: Once | INTRAVENOUS | Status: AC
  Administered 2022-12-12: 500 mL via INTRAVENOUS

## 2022-12-12 MED ORDER — KCL-LACTATED RINGERS 20 MEQ/L IV SOLN: INTRAVENOUS | Status: DC

## 2022-12-12 MED ORDER — POTASSIUM CHLORIDE 20 MEQ PO PACK
40.0000 meq | PACK | ORAL | Status: DC
  Filled 2022-12-12: qty 2

## 2022-12-12 MED ORDER — POTASSIUM CHLORIDE 10 MEQ/100ML IV SOLN
10.0000 meq | INTRAVENOUS | Status: AC
  Administered 2022-12-12 (×2): 10 meq via INTRAVENOUS
  Filled 2022-12-12: qty 100

## 2022-12-12 MED ORDER — SODIUM CHLORIDE 0.9 % IV SOLN
12.5000 mg | Freq: Four times a day (QID) | INTRAVENOUS | Status: DC | PRN
  Administered 2022-12-12: 12.5 mg via INTRAVENOUS
  Filled 2022-12-12: qty 0.5
  Filled 2022-12-12: qty 12.5

## 2022-12-12 MED ORDER — ENSURE ENLIVE PO LIQD
237.0000 mL | Freq: Three times a day (TID) | ORAL | Status: DC
  Administered 2022-12-12 – 2022-12-14 (×2): 237 mL via ORAL

## 2022-12-12 MED ORDER — CYANOCOBALAMIN 1000 MCG/ML IJ SOLN
1000.0000 ug | Freq: Once | INTRAMUSCULAR | Status: AC
  Administered 2022-12-12: 1000 ug via INTRAMUSCULAR
  Filled 2022-12-12: qty 1

## 2022-12-12 MED ORDER — SODIUM CHLORIDE 0.9 % IV SOLN
25.0000 mg | INTRAVENOUS | Status: DC | PRN
  Administered 2022-12-12: 25 mg via INTRAVENOUS
  Filled 2022-12-12 (×2): qty 1

## 2022-12-12 NOTE — Plan of Care (Signed)
  Problem: Respiratory: Goal: Ability to maintain adequate ventilation will improve Outcome: Progressing   Problem: Nutrition: Goal: Adequate nutrition will be maintained Outcome: Not Progressing   Problem: Pain Managment: Goal: General experience of comfort will improve Outcome: Not Progressing   Patient is having constant nausea and pain. Unable to take oral medications without vomiting. MD updated.

## 2022-12-12 NOTE — Progress Notes (Signed)
Patient had an episode of profuse sweating, now feels dizzy. No fever, no hypoglycemia. I checked covid again, blood cultures. Abx switched to unasyn. Will give NS bolus.  Reviewed echo 01/2022 EF was normal.  Repeat echo for near syncope.

## 2022-12-12 NOTE — Plan of Care (Signed)
  Problem: Elimination: Goal: Will not experience complications related to urinary retention Outcome: Progressing   Problem: Safety: Goal: Ability to remain free from injury will improve Outcome: Progressing   

## 2022-12-12 NOTE — Consult Note (Signed)
Consultation  Referring Provider:     Dr Chipper Herb Admit date 12/11/22 Consult date  8/19./24       Reason for Consultation:     NV         HPI:   Henry Owens is a 62 y.o. male with medical history significant of COPD, GERD, CKDIII,Depression , hypertension , Anxiety , Arthritis, periods of altered mental status- following with neuro , Hx of severe pneumonia with cardio/pulmonary arrest 2023 Had a fall 2w ago that results in abdominal wall hematoma- was hospitalized and discharged. States his NVD started around that time. Prior to that he had no Gi symptoms at all and was doing well in his abdomen. He has never had any egd/colonoscopy. Sas started on muscle relaxers and oxycodone for pain at last hospitalization- has been taking 10mg  po tid at home. Does endorse eating cannabinoid gummis. Denies nsaids. Denies etoh/illicits. States he cannot eat more than a few bites. Feels nauseated frequently- however states it is usually provoked by coughing- sometimes has vomiting afterwards- describes emesis as yellowish. Denies dysphagia and problems with GERD. Denies melena/hematochezia.. States stools loose and brown. C diff test pending. He is on antibiotics for pneumonia at this point. Feels malaised.  Note he is quite anemic with elevated ferritin and increased iron saturation. B12 normal. Magnesium normal. Iron normal. 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 He has also followed with pain clinic and been on long term chronic narcotics. Mother had colon cancer   CT A/P 12/10/22- IMPRESSION: 1. A 14 x 5 x 8.9 cm intramuscular hematoma in the left abdominal oblique musculature. Small amount of hemorrhage extending into the peritoneum and tracking along the lateral margin  of the spleen. 2. Patchy ground-glass opacities at the right lung base which may be secondary to an infectious or inflammatory etiology. 3. Diffuse osteosclerosis which may be secondary to diffuse metastatic disease versus hyperparathyroidism, myelofibrosis or renal osteodystrophy amongst other etiologies. Overall unchanged compared with prior CT chest dated 07/05/2021.  CT A/P 11/26/22- IMPRESSION: 1. Large left lateral abdominal wall/flank hematoma with focus of active extravasation. No adjacent rib fracture. 2. No additional acute traumatic injury to the chest, abdomen, or pelvis. 3. Diffuse sclerotic appearance of the osseous structures, a chronic finding. 4. Emphysema. 5. Colonic diverticulosis without diverticulitis.  PREVIOUS ENDOSCOPIES:            None    Past Medical History:  Diagnosis Date   Anxiety    Arthritis    Flu 07/2017   Intractable nausea and vomiting 12/11/2022    Past Surgical History:  Procedure Laterality Date   APPENDECTOMY  1975   BACK SURGERY     KNEE ARTHROSCOPY WITH MEDIAL MENISECTOMY Right 08/20/2017   Procedure: KNEE ARTHROSCOPY WITH PARTIAL MEDIAL AND LATERAL MENISECTOMY,PARTIAL SYNOVECTOMY,CHONDROPLASTY, LOOSE BODY REMOVAL;  Surgeon: Lyndle Herrlich, MD;  Location: [facility] ORS;  Service: Orthopedics;  Laterality: Right;   KNEE SURGERY  1975   1996, 2000    Family History  Problem Relation Age of Onset   Bladder Cancer Neg Hx    Prostate cancer Neg Hx    Hematuria Neg Hx    Mother- colon cancer  Social History   Tobacco Use   Smoking status: Every Day  Current packs/day: 1.50    Average packs/day: 1.5 packs/day for 25.0 years (37.5 ttl pk-yrs)    Types: Cigarettes   Smokeless tobacco: Never  Vaping Use   Vaping status: Never Used  Substance Use Topics   Alcohol use: No   Drug use: Not Currently    Prior to Admission medications   Medication Sig Start Date End Date Taking? Authorizing Provider  acetaminophen (TYLENOL) 500 MG  tablet Take 1,000 mg by mouth in the morning, at noon, and at bedtime.    [provider]  ALPRAZolam Prudy Feeler) 0.25 MG tablet Take 0.25 mg by mouth 2 (two) times daily as needed for anxiety or sleep.    [provider]  amLODipine (NORVASC) 5 MG tablet Take 5 mg by mouth daily. 09/29/22 09/29/23  [provider]  atenolol (TENORMIN) 50 MG tablet Take 25 mg by mouth daily. 08/31/22 08/31/23  [provider]  atorvastatin (LIPITOR) 10 MG tablet Take 10 mg by mouth every evening. 09/29/22 09/29/23  [provider]  budesonide-formoterol (SYMBICORT) 80-4.5 MCG/ACT inhaler Inhale 2 puffs into the lungs in the morning and at bedtime.    [provider]  celecoxib (CELEBREX) 100 MG capsule Take 100 mg by mouth 2 (two) times daily. 11/24/22   [provider]  fluticasone (FLONASE) 50 MCG/ACT nasal spray Place 1 spray into both nostrils daily as needed for allergies or rhinitis.    [provider]  gabapentin (NEURONTIN) 600 MG tablet Take 900 mg by mouth 2 (two) times daily.    [provider]  lisinopril (ZESTRIL) 10 MG tablet Take 10 mg by mouth daily.    [provider]  naloxone [facility]) nasal spray 4 mg/0.1 mL As needed For opioid reversal (unresponsiveness) intranasal spray Patient not taking: Reported on 11/27/2022 10/12/21   Alford Highland, MD  oxyCODONE 10 MG TABS Take 0.5-1 tablets (5-10 mg total) by mouth every 6 (six) hours as needed for severe pain or moderate pain. 11/30/22   Meuth, Brooke A, PA-C  pantoprazole (PROTONIX) 40 MG tablet Take 1 tablet (40 mg total) by mouth daily. 07/11/21   Darlin Priestly, MD  polyethylene glycol (MIRALAX / GLYCOLAX) 17 g packet Take 17 g by mouth daily as needed (constipation). 11/30/22   Meuth, Brooke A, PA-C  tiZANidine (ZANAFLEX) 4 MG tablet Take 4 mg by mouth 3 (three) times daily. 11/24/22   [provider]  venlafaxine XR (EFFEXOR-XR) 150 MG 24 hr capsule Take 150 mg by mouth daily with  breakfast. 09/29/22   [provider]    Current Facility-Administered Medications  Medication Dose Route Frequency Provider Last Rate Last Admin   acetaminophen (TYLENOL) tablet 650 mg  650 mg Oral Q6H PRN Lurline Del, MD   650 mg at 12/11/22 0430   Or   acetaminophen (TYLENOL) suppository 650 mg  650 mg Rectal Q6H PRN Lurline Del, MD       albuterol (PROVENTIL) (2.5 MG/3ML) 0.083% nebulizer solution 2.5 mg  2.5 mg Nebulization Q2H PRN Lurline Del, MD       ALPRAZolam Prudy Feeler) tablet 0.25 mg  0.25 mg Oral BID PRN Skip Mayer A, MD   0.25 mg at 12/12/22 0057   amLODipine (NORVASC) tablet 5 mg  5 mg Oral Daily Skip Mayer A, MD   5 mg at 12/10/22 2122   Ampicillin-Sulbactam (UNASYN) 3 g in sodium chloride 0.9 % 100 mL IVPB  3 g Intravenous Q6H Marrion Coy, MD   Stopped at 12/12/22  1914   atenolol (TENORMIN) tablet 25 mg  25 mg Oral Daily Skip Mayer A, MD   25 mg at 12/10/22 2123   feeding supplement (BOOST / RESOURCE BREEZE) liquid 1 Container  1 Container Oral TID BM Marrion Coy, MD       fluticasone (FLONASE) 50 MCG/ACT nasal spray 1 spray  1 spray Each Nare Daily PRN Lurline Del, MD       gabapentin (NEURONTIN) capsule 900 mg  900 mg Oral BID Skip Mayer A, MD   900 mg at 12/10/22 2121   lactated ringers 1,000 mL with potassium chloride 20 mEq infusion   Intravenous Continuous Barrie Folk, RPH       mometasone-formoterol (DULERA) 100-5 MCG/ACT inhaler 2 puff  2 puff Inhalation BID Lurline Del, MD       ondansetron [facility]) tablet 4 mg  4 mg Oral Q6H PRN Lurline Del, MD       Or   ondansetron [facility]) injection 4 mg  4 mg Intravenous Q6H PRN Lurline Del, MD   4 mg at 12/12/22 0836   oxyCODONE-acetaminophen (PERCOCET) 7.5-325 MG per tablet 1 tablet  1 tablet Oral Q4H PRN Marrion Coy, MD   1 tablet at 12/12/22 0841   pantoprazole (PROTONIX) EC tablet 40 mg  40 mg Oral BID Valentino Hue, MD   40 mg  at 12/12/22 0841   polyethylene glycol (MIRALAX / GLYCOLAX) packet 17 g  17 g Oral Daily PRN Skip Mayer A, MD       potassium chloride 10 mEq in 100 mL IVPB  10 mEq Intravenous Q1 Hr x 2 Marrion Coy, MD 100 mL/hr at 12/12/22 1218 10 mEq at 12/12/22 1218   promethazine (PHENERGAN) 25 mg in sodium chloride 0.9 % 50 mL IVPB  25 mg Intravenous Q4H PRN Marrion Coy, MD       sucralfate (CARAFATE) tablet 1 g  1 g Oral TID WC & HS Marrion Coy, MD   1 g at 12/12/22 0841   tiZANidine (ZANAFLEX) tablet 4 mg  4 mg Oral TID Lurline Del, MD   4 mg at 12/10/22 2122    Allergies as of 12/10/2022 - Review Complete 12/10/2022  Allergen Reaction Noted   Wound dressing adhesive Other (See Comments) 11/10/2021   Azithromycin Rash 12/02/2014   Penicillin g Rash 12/02/2014     Review of Systems:    All systems reviewed and negative except where noted in HPI with exception of chronic sob   Physical Exam:  Vital signs in last 24 hours: Temp:  [97.7 F (36.5 C)-98.4 F (36.9 C)] 98.4 F (36.9 C) (08/20 0802) Pulse Rate:  [92-101] 95 (08/20 0802) Resp:  [16-20] 18 (08/20 0802) BP: (148-163)/(67-81) 152/75 (08/20 0802) SpO2:  [94 %-100 %] 94 % (08/20 0802) Last BM Date : 12/09/22 General:   Pleasant man in NAD but appears ill Head:  Normocephalic and atraumatic. Eyes:   No icterus.   Conjunctiva pink. Ears:  Normal auditory acuity. Mouth: Mucosa pink moist, no lesions. Neck:  Supple; no masses felt Lungs:  Respirations even and unlabored. Faint wheeze otherwise Lungs clear to auscultation bilaterally.   No crackles, or rhonchi.  Heart:  S1S2, RRR, no MRG. No edema. Abdomen:   large left lateral protrusion- tender- consistent with hematoma. Otherwise Flat, soft, nondistended, no other tenderness Normal bowel sounds. No appreciable masses or hepatomegaly. No rebound signs or other peritoneal signs. Rectal:  Not performed.  Msk:  MAEW  x4, No clubbing or cyanosis. Strength 5/5. Symmetrical  without gross deformities. Neurologic:  Alert and  oriented x4;  Cranial nerves II-XII intact.  Skin:  Warm, slightly damp, pale pink without significant lesions or rashes. Psych:  Alert and cooperative. Normal affect.  LAB RESULTS: Recent Labs    12/10/22 1446 12/11/22 0533  WBC 13.1* 5.6  HGB 11.3* 8.2*  HCT 34.7* 23.7*  PLT 455* 222   BMET Recent Labs    12/10/22 1446 12/11/22 0533 12/12/22 0903  NA 137 137 138  K 3.3* 3.0* 3.3*  CL 103 106 103  CO2 20* 26 23  GLUCOSE 171* 128* 118*  BUN 12 11 6*  CREATININE 1.48* 1.39* 1.03  CALCIUM 9.7 8.6* 8.7*   LFT Recent Labs    12/11/22 0533  PROT 6.2*  ALBUMIN 3.4*  AST 15  ALT 15  ALKPHOS 150*  BILITOT 1.0   PT/INR No results for input(s): "LABPROT", "INR" in the last 72 hours.  STUDIES: DG ABD ACUTE 2+V W 1V CHEST  Result Date: 12/11/2022 CLINICAL DATA:  Abdominal pain EXAM: DG ABDOMEN ACUTE WITH 1 VIEW CHEST COMPARISON:  CT 11/26/2022 and 12/10/2022 FINDINGS: The lungs are symmetrically well expanded. Interval development of mild diffuse interstitial pulmonary infiltrate in keeping with interstitial pulmonary edema or airway inflammation. No pneumothorax or pleural effusion. Cardiac size within normal limits. Normal abdominal gas pattern.  No free intraperitoneal gas. Diffuse osseous sclerosis again noted, possibly related to changes of widespread osseous metastatic disease, hyperparathyroidism, myelofibrosis, mastocytosis, or renal osteodystrophy. Lumbar fusion with instrumentation again noted. IMPRESSION: 1. Interval development of mild diffuse interstitial pulmonary infiltrate in keeping with interstitial pulmonary edema or airway inflammation. 2. Normal abdominal gas pattern. 3. Diffuse osseous sclerosis again noted. Electronically Signed   By: Helyn Numbers M.D.   On: 12/11/2022 21:44   CT ABDOMEN PELVIS W CONTRAST  Addendum Date: 12/10/2022   ADDENDUM REPORT: 12/10/2022 18:04 COMPARISON:  11/26/2022  Intramuscular hematoma in the left abdominal oblique musculature demonstrates interval decrease in size. The small amount of hemorrhage extending into the peritoneum and tracking along the lateral margin of the spleen has also decreased in volume. Electronically Signed   By: Elige Ko M.D.   On: 12/10/2022 18:04   Result Date: 12/10/2022 CLINICAL DATA:  Abdominal pain EXAM: CT ABDOMEN AND PELVIS WITH CONTRAST TECHNIQUE: Multidetector CT imaging of the abdomen and pelvis was performed using the standard protocol following bolus administration of intravenous contrast. RADIATION DOSE REDUCTION: This exam was performed according to the departmental dose-optimization program which includes automated exposure control, adjustment of the mA and/or kV according to patient size and/or use of iterative reconstruction technique. CONTRAST:  OMNIPAQUE IOHEXOL 300 MG/ML  SOLN COMPARISON:  None Available. FINDINGS: Lower chest: Patchy ground-glass opacities at the right lung base which may be secondary to an infectious or inflammatory etiology. Hepatobiliary: 4 mm left hepatic cyst. No other focal hepatic lesion. Distended gallbladder. No gallstones, gallbladder wall thickening, or biliary dilatation. Pancreas: Unremarkable. No pancreatic ductal dilatation or surrounding inflammatory changes. Spleen: Normal in size without focal abnormality. Adrenals/Urinary Tract: Adrenal glands are unremarkable. Kidneys are normal, without renal calculi, focal lesion, or hydronephrosis. Bladder is unremarkable. Stomach/Bowel: Stomach is within normal limits. No evidence of bowel wall thickening, distention, or inflammatory changes. Vascular/Lymphatic: No significant vascular findings are present. No enlarged abdominal or pelvic lymph nodes. Reproductive: Prostate is unremarkable. Other: No abdominal wall hernia or abnormality. No abdominopelvic ascites. Musculoskeletal: 14 x 5 x 8.9 cm intramuscular hematoma  in the left abdominal  oblique musculature. Small amount of hemorrhage extending into the peritoneum and tracking along the lateral margin of the spleen. No acute osseous abnormality. No aggressive osseous lesion. Mild osteoarthritis of the hips bilaterally. Diffuse osteosclerosis which may be secondary to diffuse metastatic disease versus hyperparathyroidism, myelofibrosis or renal osteodystrophy. Posterior lumbar fusion from L4 through S1. IMPRESSION: 1. A 14 x 5 x 8.9 cm intramuscular hematoma in the left abdominal oblique musculature. Small amount of hemorrhage extending into the peritoneum and tracking along the lateral margin of the spleen. 2. Patchy ground-glass opacities at the right lung base which may be secondary to an infectious or inflammatory etiology. 3. Diffuse osteosclerosis which may be secondary to diffuse metastatic disease versus hyperparathyroidism, myelofibrosis or renal osteodystrophy amongst other etiologies. Overall unchanged compared with prior CT chest dated 07/05/2021. Electronically Signed: By: Elige Ko M.D. On: 12/10/2022 16:19       Impression / Plan:   NV- likely multifactorial with current illness, meds - particularly narcotics. Advised to stop the cannabis due to potential for adverse effects. These also may be affecting his neurologic status. Will schedule some zofran over the next 24h- can continue the prn phenergan orally q4h prn. Recommend fliq diet for now. Agree with current ppi and cdiff test. Could consider egd pending clinical situation v GES Anemia with possible bone marrow disease and bony lesions- recommend he get back with oncology for care. Also very likely related to his large abdominal wall hematoma Will need colonoscopy for screening when able.  Thank you very much for this consult. These services were provided by Vevelyn Pat, NP-C, in collaboration with Regis Bill, MD, with whom I have discussed this patient in full.   Vevelyn Pat, NP-C

## 2022-12-12 NOTE — Progress Notes (Signed)
Progress Note   Patient: Henry Owens:[id number] DOB: Jun 22, 1960 DOA: 12/10/2022     2 DOS: the patient was seen and examined on 12/12/2022   Brief hospital course: Henry Owens is a 62 y.o. male with medical history significant of COPD, GERD, CKDIII,Depression , hypertension , Anxiety , Arthritis, periods of altered  mental status , Hx of severe pneumonia with cardio/pulmonary arrest. Patient also has interim history of admission on 8/4-8/8 with abdominal wall hematoma s/p fall.  He came back to the hospital complaining of intractable nausea vomiting for the last 2 weeks. CT scan showed left abdominal hematoma has reduced in size, but showed patchy infiltrates in the right lower base.  Patient is placed on antibiotics for pneumonia.   Principal Problem:   CAP (community acquired pneumonia) Active Problems:   CKD stage 3a, GFR 45-59 ml/min (HCC)   Hypokalemia   Overweight (BMI 25.0-29.9)   Intractable nausea and vomiting   Cannabinoid hyperemesis syndrome   Assessment and Plan: Retractable nausea vomiting. Cannabinoid hyperemesis syndrome. Patient states that he does not have significant diarrhea, only very little loose stools.  He also has a very poor appetite. CT scan of the abdomen/pelvis did not show any evidence of acute abdomen.  Repeated KUB did not show any acute abdomen. Reviewed patient prior lab, patient was positive for marijuana from a urine tox screen.  Patient admitted using marijuana product almost daily, last use a week ago.  Patient most likely has cannabinoid hyperemesis syndrome.  I will discontinue Dilaudid, start Haldol 2 mg IV as needed.  Advance diet to soft.  Continue IV fluids until patient can tolerate diet.   Bilateral lower lobe aspiration pneumonia. Patient has minimal elevation of procalcitonin level, most likely due to aspiration from nausea vomiting.  Antibiotic changed to Unasyn x 5 days. Will change oral Augmentin once patient  starting tolerating diet.   Abdominal wall hematoma. Continue to hold anticoagulation.   Acute metabolic encephalopathy. Mental status better today.   COPD. Stable.   Chronic kidney disease stage IIIa. Hypokalemia. Mild metabolic acidosis. Patient renal function has become a normal, he may not have chronic kidney disease instead may have AKI.  Will document was diagnosed to become clear. Continue replete potassium.      Subjective:  Patient still has nausea, abdominal pain.  Physical Exam: Vitals:   12/11/22 1630 12/11/22 1954 12/12/22 0532 12/12/22 0802  BP: (!) 150/81 (!) 148/67 (!) 163/78 (!) 152/75  Pulse: 95 (!) 101 92 95  Resp: 16  20 18   Temp: 97.8 F (36.6 C) 98 F (36.7 C) 97.7 F (36.5 C) 98.4 F (36.9 C)  TempSrc: Oral Oral Oral Oral  SpO2: 100% 100% 95% 94%  Weight:      Height:       General exam: Appears calm and comfortable  Respiratory system: Clear to auscultation. Respiratory effort normal. Cardiovascular system: S1 & S2 heard, RRR. No JVD, murmurs, rubs, gallops or clicks. No pedal edema. Gastrointestinal system: Abdomen is nondistended, soft and mildly tender. No organomegaly or masses felt. Normal bowel sounds heard. Central nervous system: Alert and oriented. No focal neurological deficits. Extremities: Symmetric 5 x 5 power. Skin: No rashes, lesions or ulcers Psychiatry: Judgement and insight appear normal. Mood & affect appropriate.    Data Reviewed:  Lab results reviewed.  Family Communication: Wife updated at bedside.  Disposition: Status is: Inpatient Remains inpatient appropriate because: Severity of disease, IV treatment.     Time spent: 50 minutes  Author: Marrion Coy, MD 12/12/2022 12:58 PM  For on call review [email or web address].

## 2022-12-13 ENCOUNTER — Inpatient Hospital Stay
Admit: 2022-12-13 | Discharge: 2022-12-13 | Disposition: A | Discharge status: Home / Self Care | Payer: Medicaid Other | Attending: Internal Medicine | Admitting: Internal Medicine

## 2022-12-13 DIAGNOSIS — R112 Nausea with vomiting, unspecified: Secondary | ICD-10-CM | POA: Diagnosis not present

## 2022-12-13 DIAGNOSIS — J690 Pneumonitis due to inhalation of food and vomit: Secondary | ICD-10-CM | POA: Diagnosis not present

## 2022-12-13 DIAGNOSIS — R55 Syncope and collapse: Secondary | ICD-10-CM

## 2022-12-13 DIAGNOSIS — F19939 Other psychoactive substance use, unspecified with withdrawal, unspecified: Secondary | ICD-10-CM | POA: Insufficient documentation

## 2022-12-13 DIAGNOSIS — F1290 Cannabis use, unspecified, uncomplicated: Secondary | ICD-10-CM | POA: Diagnosis not present

## 2022-12-13 LAB — BASIC METABOLIC PANEL
Anion gap: 6 (ref 5–15)
BUN: 6 mg/dL — ABNORMAL LOW (ref 8–23)
CO2: 22 mmol/L (ref 22–32)
Calcium: 8.4 mg/dL — ABNORMAL LOW (ref 8.9–10.3)
Chloride: 110 mmol/L (ref 98–111)
Creatinine, Ser: 0.96 mg/dL (ref 0.61–1.24)
GFR, Estimated: >60 mL/min (ref >60)
Glucose, Bld: 91 mg/dL (ref 70–99)
Potassium: 3.2 mmol/L — ABNORMAL LOW (ref 3.5–5.1)
Sodium: 138 mmol/L (ref 135–145)

## 2022-12-13 LAB — ECHOCARDIOGRAM COMPLETE
AR max vel: 2.09 cm2
AV Area VTI: 2.09 cm2
AV Area mean vel: 2.21 cm2
AV Mean grad: 5 mmHg
AV Peak grad: 9.4 mmHg
Ao pk vel: 1.53 m/s
Area-P 1/2: 3.39 cm2
Height: 69 in
MV VTI: 2.8 cm2
S' Lateral: 3.2 cm
Weight: 3149.93 [oz_av]

## 2022-12-13 LAB — CBC
HCT: 24.3 % — ABNORMAL LOW (ref 39.0–52.0)
Hemoglobin: 8.1 g/dL — ABNORMAL LOW (ref 13.0–17.0)
MCH: 32.5 pg (ref 26.0–34.0)
MCHC: 33.3 g/dL (ref 30.0–36.0)
MCV: 97.6 fL (ref 80.0–100.0)
Platelets: 221 10*3/uL (ref 150–400)
RBC: 2.49 MIL/uL — ABNORMAL LOW (ref 4.22–5.81)
RDW: 17 % — ABNORMAL HIGH (ref 11.5–15.5)
WBC: 4.5 10*3/uL (ref 4.0–10.5)
nRBC: 0.4 % — ABNORMAL HIGH (ref 0.0–0.2)

## 2022-12-13 LAB — MAGNESIUM: Magnesium: 2.3 mg/dL (ref 1.7–2.4)

## 2022-12-13 LAB — GLUCOSE, CAPILLARY: Glucose-Capillary: 93 mg/dL (ref 70–99)

## 2022-12-13 LAB — HOMOCYSTEINE: Homocysteine: 20.3 umol/L — ABNORMAL HIGH (ref 0.0–17.2)

## 2022-12-13 MED ORDER — AMOXICILLIN 500 MG PO CAPS
500.0000 mg | ORAL_CAPSULE | Freq: Three times a day (TID) | ORAL | Status: DC
  Administered 2022-12-13 – 2022-12-14 (×3): 500 mg via ORAL
  Filled 2022-12-13 (×4): qty 1

## 2022-12-13 MED ORDER — VITAMIN B-12 1000 MCG PO TABS
1000.0000 ug | ORAL_TABLET | Freq: Every day | ORAL | Status: DC
  Administered 2022-12-13 – 2022-12-14 (×2): 1000 ug via ORAL
  Filled 2022-12-13 (×2): qty 1

## 2022-12-13 MED ORDER — POTASSIUM CHLORIDE 20 MEQ PO PACK
40.0000 meq | PACK | ORAL | Status: AC
  Administered 2022-12-13 (×2): 40 meq via ORAL
  Filled 2022-12-13 (×2): qty 2

## 2022-12-13 NOTE — Progress Notes (Signed)
*  PRELIMINARY RESULTS* Echocardiogram 2D Echocardiogram has been performed.  Carolyne Fiscal 12/13/2022, 3:39 PM

## 2022-12-13 NOTE — Progress Notes (Signed)
GI Inpatient Follow-up Note  Subjective:  Patient seen and doing much better than yesterday. States nausea and  abdominal pain have improved.  Scheduled Inpatient Medications:   amLODipine  5 mg Oral Daily   amoxicillin  500 mg Oral Q8H   atenolol  25 mg Oral Daily   vitamin B-12  1,000 mcg Oral Daily   feeding supplement  237 mL Oral TID BM   gabapentin  900 mg Oral BID   mometasone-formoterol  2 puff Inhalation BID   multivitamin with minerals  1 tablet Oral Daily   ondansetron (ZOFRAN) IV  4 mg Intravenous Q6H   pantoprazole  40 mg Oral BID AC   potassium chloride  40 mEq Oral Q2H   tiZANidine  4 mg Oral TID    Continuous Inpatient Infusions:    lactated ringers 1,000 mL with potassium chloride 20 mEq infusion 75 mL/hr at 12/13/22 1118   promethazine (PHENERGAN) 25 mg in sodium chloride 0.9 % 50 mL IVPB 25 mg (12/12/22 1649)    PRN Inpatient Medications:  acetaminophen **OR** acetaminophen, albuterol, ALPRAZolam, fluticasone, oxyCODONE-acetaminophen, polyethylene glycol, promethazine (PHENERGAN) 25 mg in sodium chloride 0.9 % 50 mL IVPB, promethazine  Review of Systems:  Review of Systems  Constitutional:  Negative for chills, diaphoresis and fever.  Respiratory:  Negative for shortness of breath.   Cardiovascular:  Negative for chest pain.  Gastrointestinal:  Positive for abdominal pain and nausea. Negative for blood in stool, constipation, diarrhea, melena and vomiting.  Skin:  Negative for rash.  Neurological:  Negative for focal weakness.  Psychiatric/Behavioral:  Negative for substance abuse.   All other systems reviewed and are negative.     Physical Examination: BP (!) 151/64 (BP Location: Left Arm)   Pulse 96   Temp 98.2 F (36.8 C) (Oral)   Resp 12   Ht 5\' 9"  (1.753 m)   Wt 89.3 kg   SpO2 98%   BMI 29.07 kg/m  Gen: NAD HEENT: PEERLA, EOMI, Neck: supple, no JVD or thyromegaly Chest: No respiratory distress Abd: soft, mild tenderness Ext: no  edema, well perfused with 2+ pulses, Skin: no rash or lesions noted Lymph: no LAD  Data: Lab Results  Component Value Date   WBC 4.5 12/13/2022   HGB 8.1 (L) 12/13/2022   HCT 24.3 (L) 12/13/2022   MCV 97.6 12/13/2022   PLT 221 12/13/2022   Recent Labs  Lab 12/11/22 0533 12/12/22 1444 12/13/22 0318  HGB 8.2* 9.8* 8.1*   Lab Results  Component Value Date   NA 138 12/13/2022   K 3.2 (L) 12/13/2022   CL 110 12/13/2022   CO2 22 12/13/2022   BUN 6 (L) 12/13/2022   CREATININE 0.96 12/13/2022   Lab Results  Component Value Date   ALT 15 12/11/2022   AST 15 12/11/2022   ALKPHOS 150 (H) 12/11/2022   BILITOT 1.0 12/11/2022   No results for input(s): "APTT", "INR", "PTT" in the last 168 hours. Assessment/Plan: Mr. Rimando is a 62 y.o. gentleman with history of COPD, GERD, CKD III, hypertension, and recent fall with abdominal wall hematoma. GI consulted for nausea/vomiting but yesterday had significant diaphoresis/tachycardia. Infectious work-up negative. Symptoms have improved today. His symptoms likely multifactorial from pain meds, marijuana use, and likely peritonitis from abdominal wall hematoma  Recommendations:  - continue current regiment of anti-emetics - avoid opioids if possible - avoid marijuana - advance diet as tolerated - needs colon cancer screening discussion as an outpatient  We will likely follow peripherally, please  call if any questions or concerns.  Merlyn Lot MD, MPH [facility]

## 2022-12-13 NOTE — Plan of Care (Signed)
  Problem: Activity: Goal: Ability to tolerate increased activity will improve Outcome: Progressing   Problem: Clinical Measurements: Goal: Ability to maintain a body temperature in the normal range will improve Outcome: Progressing   Problem: Respiratory: Goal: Ability to maintain adequate ventilation will improve Outcome: Progressing Goal: Ability to maintain a clear airway will improve Outcome: Progressing   Problem: Activity: Goal: Ability to tolerate increased activity will improve Outcome: Progressing   Problem: Clinical Measurements: Goal: Ability to maintain a body temperature in the normal range will improve Outcome: Progressing   Problem: Respiratory: Goal: Ability to maintain adequate ventilation will improve Outcome: Progressing Goal: Ability to maintain a clear airway will improve Outcome: Progressing   Problem: Education: Goal: Knowledge of General Education information will improve Description: Including pain rating scale, medication(s)/side effects and non-pharmacologic comfort measures Outcome: Progressing   Problem: Health Behavior/Discharge Planning: Goal: Ability to manage health-related needs will improve Outcome: Progressing   Problem: Clinical Measurements: Goal: Ability to maintain clinical measurements within normal limits will improve Outcome: Progressing Goal: Will remain free from infection Outcome: Progressing Goal: Diagnostic test results will improve Outcome: Progressing Goal: Respiratory complications will improve Outcome: Progressing Goal: Cardiovascular complication will be avoided Outcome: Progressing

## 2022-12-13 NOTE — Progress Notes (Signed)
Initial Nutrition Assessment  DOCUMENTATION CODES:   Not applicable  INTERVENTION:   -Advance to soft diet per discussion with MD -MVI with minerals daily -Continue Ensure Enlive po TID, each supplement provides 350 kcal and 20 grams of protein  NUTRITION DIAGNOSIS:   Increased nutrient needs related to chronic illness (COPD) as evidenced by estimated needs.  GOAL:   Patient will meet greater than or equal to 90% of their needs  MONITOR:   PO intake, Supplement acceptance, Diet advancement  REASON FOR ASSESSMENT:   Malnutrition Screening Tool    ASSESSMENT:   Pt with medical history significant of COPD, GERD, CKDIII,Depression , hypertension , Anxiety , Arthritis, periods of altered  mental status , Hx of severe pneumonia with cardio/pulmonary arrest. Patient also has interim history of admission on 8/4-8/8 with abdominal wall hematoma s/p fall.  He came back to the hospital complaining of intractable nausea vomiting for the last 2 weeks.  Pt admitted with CAP, retractable nausea and vomiting, and cannabinoid hyperemesis syndrome/ withdrawal.   8/18- advanced to clear liquid diet 8/20- advanced to full liquid diet 8/21- advanced to soft diet  Reviewed I/O's: +1.5 L x 24 hours and +4.5 L since admission  UOP: 200 ml x 24 hours   Per MD notes, CT revealed lt abdominal hematoma which has reduced in size, but revealed patchy infiltrates in rt lower base.   MD reveals pt's symptoms are consistent with cannabis hyperemesis syndrome. He is also having profuse sweating consistent with cannabis withdrawal.   Spoke with pt at bedside, who was pleasant and in good spirits today. He reports feeling better. Per pt, he has had poor oral intake PTA- he explains that things started to "go downhill" about 3 weeks ago when he sneezed, which caused a fall which resulted in a lt side hematoma. Pt reports that he has been having difficulty keeping foods and liquids down secondary to nausea  and vomiting PTA. Since hospitalization, nausea and vomiting have resolved and pt tolerating full liquids well. Pt shares that he is ready to try solid foods and complains of limitations of full liquid diet. Noted meal completions 0-25%. Case discussed with MD; diet to be advanced to soft.   Reviewed wt hx; no wt loss noted over the past 3 weeks. Pt endorses a 15# wt loss over the past 3 weeks. He estimates his UBW is around 200#.   Discussed importance of good meal and supplement intake to promote healing. Pt amenable to continue supplements. He had no further questions or concerns, but expressed appreciation for visit.   Medications reviewed and include tenormin, vitamin B-12, neurontin, zofran, Potassium chloride, and lactated ringers with potassium chloride 20 mEq infusion @ 75 ml/hr.   Lab Results  Component Value Date   HGBA1C 5.5 12/10/2022   PTA DM medications are none.   Labs reviewed: K: 3.2, CBGS: 93-119 (inpatient orders for glycemic control are none).    NUTRITION - FOCUSED PHYSICAL EXAM:  Flowsheet Row Most Recent Value  Orbital Region No depletion  Upper Arm Region No depletion  Thoracic and Lumbar Region No depletion  Buccal Region No depletion  Temple Region No depletion  Clavicle Bone Region No depletion  Clavicle and Acromion Bone Region No depletion  Scapular Bone Region No depletion  Dorsal Hand No depletion  Patellar Region No depletion  Anterior Thigh Region No depletion  Posterior Calf Region No depletion  Edema (RD Assessment) None  Hair Reviewed  Eyes Reviewed  Mouth Reviewed  Skin  Reviewed  Nails Reviewed       Diet Order:   Diet Order             DIET SOFT Room service appropriate? Yes; Fluid consistency: Thin  Diet effective now                   EDUCATION NEEDS:   Education needs have been addressed  Skin:  Skin Assessment: Reviewed RN Assessment  Last BM:  12/12/22 (type 7)  Height:   Ht Readings from Last 1 Encounters:   12/10/22 5\' 9"  (1.753 m)    Weight:   Wt Readings from Last 1 Encounters:  12/13/22 89.3 kg    Ideal Body Weight:  72.7 kg  BMI:  Body mass index is 29.07 kg/m.  Estimated Nutritional Needs:   Kcal:  2000-2200  Protein:  105-120 grams  Fluid:  > 2 L    Levada Schilling, RD, LDN, CDCES Registered Dietitian II Certified Diabetes Care and Education Specialist Please refer to [facility] for RD and/or RD on-call/weekend/after hours pager

## 2022-12-13 NOTE — Progress Notes (Signed)
Progress Note   Patient: Henry Owens UYQ:[id number] DOB: 12-26-1960 DOA: 12/10/2022     3 DOS: the patient was seen and examined on 12/13/2022   Brief hospital course: Aashir Portwood is a 62 y.o. male with medical history significant of COPD, GERD, CKDIII,Depression , hypertension , Anxiety , Arthritis, periods of altered  mental status , Hx of severe pneumonia with cardio/pulmonary arrest. Patient also has interim history of admission on 8/4-8/8 with abdominal wall hematoma s/p fall.  He came back to the hospital complaining of intractable nausea vomiting for the last 2 weeks. CT scan showed left abdominal hematoma has reduced in size, but showed patchy infiltrates in the right lower base.  Patient is placed on antibiotics for pneumonia. 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  Nausea vomiting gradually improving.  Patient has intermittent profuse sweating.  Consistent with cannabis withdrawal. Patient seems to tolerate a diet on 8/21, advance diet.  Also on Neurontin.   Principal Problem:   CAP (community acquired pneumonia) Active Problems:   Aspiration pneumonia (HCC)   CKD stage 3a, GFR 45-59 ml/min (HCC)   Hypokalemia   Overweight (BMI 25.0-29.9)   Intractable nausea and vomiting   Cannabinoid hyperemesis syndrome   Synthetic cannabinoid withdrawal (HCC)   Assessment and Plan: Retractable nausea vomiting. Cannabinoid hyperemesis syndrome. Cannabinoid withdrawal. Presented to the hospital with intractable nausea and vomiting. CT scan of the abdomen/pelvis did not show any evidence of acute abdomen.  Repeated KUB did not show any acute abdomen. Reviewed patient prior lab, patient was positive for marijuana from a urine tox screen.  Patient admitted using marijuana product almost daily, last use a week ago.  Patient most likely has cannabinoid hyperemesis syndrome.   Patient was having intermittent profuse sweating for the past week, did a  literature research, is appear to be due to withdrawal from synthetic cannabinoid.  Patient is already on Neurontin, no additional treatment is needed.  Will continue IV fluids to keep him rehydrated. Nausea vomiting seem to be improving, advance diet.    Bilateral lower lobe aspiration pneumonia. Patient has minimal elevation of procalcitonin level, most likely due to aspiration from nausea vomiting.  Antibiotic changed to Unasyn.  Patient has a prior rash to PCN, but has no reaction to Unasyn.  Will change to amoxicillin for additional 2-1/2 days.  Condition had improved.    Abdominal wall hematoma. Continue to hold anticoagulation.   Acute metabolic encephalopathy. Mental status better today.   COPD. Stable.   Acute kidney injury. Chronic kidney disease stage IIIa rule out. Hypokalemia. Mild metabolic acidosis. Patient renal function had improved to normal.  Patient does not have chronic kidney disease stage IIIa. Potassium is low again, repleted.  Recheck level tomorrow.  Anemia of chronic disease. Patient has adequate iron level, B12 level 274, check homocystine level.  Start the B12 supplement.       Subjective:  Patient feels much better today, no significant nausea.  No diarrhea.  Started eating.  Denies any short of breath or cough.  Physical Exam: Vitals:   12/12/22 2015 12/12/22 2051 12/13/22 0333 12/13/22 0815  BP: 134/85 (!) 144/73 (!) 157/71 (!) 151/64  Pulse: (!) 103 (!) 102 95 96  Resp: 18 16 20 12   Temp: 98.1 F (36.7 C) 98.8 F (37.1 C) 98.8 F (37.1 C) 98.2 F (36.8 C)  TempSrc: Oral Oral Oral Oral  SpO2: 99% 100% 98% 98%  Weight:   89.3 kg   Height:  General exam: Appears calm and comfortable  Respiratory system: Clear to auscultation. Respiratory effort normal. Cardiovascular system: S1 & S2 heard, RRR. No JVD, murmurs, rubs, gallops or clicks. No pedal edema. Gastrointestinal system: Abdomen is nondistended, soft and nontender. No  organomegaly or masses felt. Normal bowel sounds heard. Central nervous system: Alert and oriented. No focal neurological deficits. Extremities: Symmetric 5 x 5 power. Skin: No rashes, lesions or ulcers Psychiatry: Judgement and insight appear normal. Mood & affect appropriate.    Data Reviewed:  Lab results reviewed.  Family Communication: Wife updated at the bedside.  Disposition: Status is: Inpatient Remains inpatient appropriate because: Severity of disease, IV treatment.     Time spent: 55 minutes  Author: Marrion Coy, MD 12/13/2022 11:34 AM  For on call review [email or web address].

## 2022-12-13 NOTE — Plan of Care (Signed)
  Problem: Pain Managment: Goal: General experience of comfort will improve Outcome: Progressing   Problem: Nutrition: Goal: Adequate nutrition will be maintained Outcome: Progressing   

## 2022-12-14 DIAGNOSIS — J690 Pneumonitis due to inhalation of food and vomit: Secondary | ICD-10-CM | POA: Diagnosis not present

## 2022-12-14 DIAGNOSIS — D513 Other dietary vitamin B12 deficiency anemia: Secondary | ICD-10-CM

## 2022-12-14 DIAGNOSIS — D519 Vitamin B12 deficiency anemia, unspecified: Secondary | ICD-10-CM | POA: Insufficient documentation

## 2022-12-14 DIAGNOSIS — R112 Nausea with vomiting, unspecified: Secondary | ICD-10-CM | POA: Diagnosis not present

## 2022-12-14 DIAGNOSIS — F19939 Other psychoactive substance use, unspecified with withdrawal, unspecified: Secondary | ICD-10-CM | POA: Diagnosis not present

## 2022-12-14 LAB — BASIC METABOLIC PANEL
Anion gap: 8 (ref 5–15)
BUN: 7 mg/dL — ABNORMAL LOW (ref 8–23)
CO2: 22 mmol/L (ref 22–32)
Calcium: 8.8 mg/dL — ABNORMAL LOW (ref 8.9–10.3)
Chloride: 110 mmol/L (ref 98–111)
Creatinine, Ser: 1.05 mg/dL (ref 0.61–1.24)
GFR, Estimated: >60 mL/min (ref >60)
Glucose, Bld: 90 mg/dL (ref 70–99)
Potassium: 4.4 mmol/L (ref 3.5–5.1)
Sodium: 140 mmol/L (ref 135–145)

## 2022-12-14 LAB — CBC
HCT: 24.5 % — ABNORMAL LOW (ref 39.0–52.0)
Hemoglobin: 8.3 g/dL — ABNORMAL LOW (ref 13.0–17.0)
MCH: 32.9 pg (ref 26.0–34.0)
MCHC: 33.9 g/dL (ref 30.0–36.0)
MCV: 97.2 fL (ref 80.0–100.0)
Platelets: 211 10*3/uL (ref 150–400)
RBC: 2.52 MIL/uL — ABNORMAL LOW (ref 4.22–5.81)
RDW: 17.6 % — ABNORMAL HIGH (ref 11.5–15.5)
WBC: 5.5 10*3/uL (ref 4.0–10.5)
nRBC: 0.4 % — ABNORMAL HIGH (ref 0.0–0.2)

## 2022-12-14 LAB — MAGNESIUM: Magnesium: 2.2 mg/dL (ref 1.7–2.4)

## 2022-12-14 LAB — LEGIONELLA PNEUMOPHILA SEROGP 1 UR AG: L. pneumophila Serogp 1 Ur Ag: NEGATIVE

## 2022-12-14 MED ORDER — AMOXICILLIN 500 MG PO CAPS: 500.0000 mg | ORAL_CAPSULE | Freq: Three times a day (TID) | ORAL | 0 refills | Status: AC

## 2022-12-14 MED ORDER — CREON 24000-76000 UNITS PO CPEP: 1.0000 | ORAL_CAPSULE | Freq: Three times a day (TID) | ORAL | 0 refills | Status: DC

## 2022-12-14 MED ORDER — CYANOCOBALAMIN 1000 MCG PO TABS: 1000.0000 ug | ORAL_TABLET | Freq: Every day | ORAL | 0 refills | Status: AC

## 2022-12-14 NOTE — Discharge Summary (Signed)
Physician Discharge Summary   Patient: Henry Owens MRN: [id number] DOB: 08-14-1960  Admit date:     12/10/2022  Discharge date: 12/14/22  Discharge Physician: Marrion Coy   PCP: Kandyce Rud, MD   Recommendations at discharge:   Follow-up with PCP in 1 week. Follow-up with GI for colonoscopy.  Discharge Diagnoses: Principal Problem:   CAP (community acquired pneumonia) Active Problems:   Aspiration pneumonia (HCC)   CKD stage 3a, GFR 45-59 ml/min (HCC)   Hypokalemia   Overweight (BMI 25.0-29.9)   Intractable nausea and vomiting   Cannabinoid hyperemesis syndrome   Synthetic cannabinoid withdrawal (HCC)   B12 deficiency anemia  Resolved Problems:   * No resolved hospital problems. [facility] Course: Na Trower is a 62 y.o. male with medical history significant of COPD, GERD, CKDIII,Depression , hypertension , Anxiety , Arthritis, periods of altered  mental status , Hx of severe pneumonia with cardio/pulmonary arrest. Patient also has interim history of admission on 8/4-8/8 with abdominal wall hematoma s/p fall.  He came back to the hospital complaining of intractable nausea vomiting for the last 2 weeks. CT scan showed left abdominal hematoma has reduced in size, but showed patchy infiltrates in the right lower base.  Patient is placed on antibiotics for pneumonia. 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  Nausea vomiting gradually improving.  Patient has intermittent profuse sweating.  Consistent with cannabis withdrawal. Patient seems to tolerate a diet on 8/21, advance diet.  Also on Neurontin. Patient condition much improved today, tolerating soft diet, no nausea vomiting.  Still has some loose stools.  No additional episodes of diaphoresis.  Medically stable for discharge.  Assessment and Plan:  Retractable nausea vomiting. Cannabinoid hyperemesis syndrome. Cannabinoid withdrawal. Presented to the hospital with intractable  nausea and vomiting. CT scan of the abdomen/pelvis did not show any evidence of acute abdomen.  Repeated KUB did not show any acute abdomen. Reviewed patient prior lab, patient was positive for marijuana from a urine tox screen.  Patient admitted using marijuana product almost daily, last use a week ago.  Patient most likely has cannabinoid hyperemesis syndrome.   Patient was having intermittent profuse sweating for the past week, did a literature research, is appear to be due to withdrawal from synthetic cannabinoid.  Patient is already on Neurontin, no additional treatment is needed.   Patient condition finally improved, currently nausea vomiting resolved, tolerating diet.  Medically stable to be discharged. Patient still has some diarrhea, which appear to be, chronic, given concurrent B12 deficiency, patient probably has some absorption, will start Creon.  Patient is also overdue for colonoscopy, he will follow-up with GI for that.. Patient is also advised to quit cannabis.     Bilateral lower lobe aspiration pneumonia. Patient has minimal elevation of procalcitonin level, most likely due to aspiration from nausea vomiting.  Antibiotic changed to Unasyn.  Patient has a prior rash to PCN, but has no reaction to Unasyn.  Patient initially treated with Unasyn, changed to amoxicillin yesterday.  Will continue 2 more days of amoxicillin. Condition had improved.     Abdominal wall hematoma. Continue to hold anticoagulation.   Acute metabolic encephalopathy. Mental status better today.   COPD. Stable.   Acute kidney injury. Chronic kidney disease stage IIIa rule out. Hypokalemia. Mild metabolic acidosis. Condition all improved.   B12 deficiency anemia. Patient has adequate iron level, B12 level 274, elevated homocystine level.  Start the B12 supplement.  Consultants: GI Procedures performed: None  Disposition: Home Diet recommendation:  Discharge Diet Orders (From  admission, onward)     Start     Ordered   12/14/22 0000  Diet - low sodium heart healthy        12/14/22 1018           Cardiac diet DISCHARGE MEDICATION: Allergies as of 12/14/2022       Reactions   Wound Dressing Adhesive Other (See Comments)   BLISTER   Azithromycin Rash   Penicillin G Rash   Has patient had a PCN reaction causing immediate rash, facial/tongue/throat swelling, SOB or lightheadedness with hypotension: Yes Has patient had a PCN reaction causing severe rash involving mucus membranes or skin necrosis: No Has patient had a PCN reaction that required hospitalization: No Has patient had a PCN reaction occurring within the last 10 years: No If all of the above answers are "NO", then may proceed with Cephalosporin use.        Medication List     STOP taking these medications    naloxone 4 MG/0.1ML Liqd nasal spray kit Commonly known as: NARCAN       TAKE these medications    acetaminophen 500 MG tablet Commonly known as: TYLENOL Take 1,000 mg by mouth in the morning, at noon, and at bedtime.   ALPRAZolam 0.25 MG tablet Commonly known as: XANAX Take 0.25 mg by mouth 2 (two) times daily as needed for anxiety or sleep.   amLODipine 5 MG tablet Commonly known as: NORVASC Take 5 mg by mouth daily.   amoxicillin 500 MG capsule Commonly known as: AMOXIL Take 1 capsule (500 mg total) by mouth every 8 (eight) hours for 2 days.   atenolol 50 MG tablet Commonly known as: TENORMIN Take 25 mg by mouth daily.   atorvastatin 10 MG tablet Commonly known as: LIPITOR Take 10 mg by mouth every evening.   budesonide-formoterol 80-4.5 MCG/ACT inhaler Commonly known as: SYMBICORT Inhale 2 puffs into the lungs in the morning and at bedtime.   celecoxib 100 MG capsule Commonly known as: CELEBREX Take 100 mg by mouth 2 (two) times daily.   Creon 24000-76000 units Cpep Generic drug: Pancrelipase (Lip-Prot-Amyl) Take 1 capsule (24,000 Units total) by mouth  3 (three) times daily before meals.   cyanocobalamin 1000 MCG tablet Take 1 tablet (1,000 mcg total) by mouth daily. Start taking on: December 15, 2022   fluticasone 50 MCG/ACT nasal spray Commonly known as: FLONASE Place 1 spray into both nostrils daily as needed for allergies or rhinitis.   gabapentin 600 MG tablet Commonly known as: NEURONTIN Take 900 mg by mouth 2 (two) times daily.   lisinopril 10 MG tablet Commonly known as: ZESTRIL Take 10 mg by mouth daily.   Oxycodone HCl 10 MG Tabs Take 0.5-1 tablets (5-10 mg total) by mouth every 6 (six) hours as needed for severe pain or moderate pain.   pantoprazole 40 MG tablet Commonly known as: Protonix Take 1 tablet (40 mg total) by mouth daily.   polyethylene glycol 17 g packet Commonly known as: MIRALAX / GLYCOLAX Take 17 g by mouth daily as needed (constipation).   tiZANidine 4 MG tablet Commonly known as: ZANAFLEX Take 4 mg by mouth 3 (three) times daily.   venlafaxine XR 150 MG 24 hr capsule Commonly known as: EFFEXOR-XR Take 150 mg by mouth daily with breakfast.        Follow-up Information     Kandyce Rud, MD Follow up  in 1 week(s).   Specialty: Family Medicine Contact information: [address] [facility] [place] [address] [phone]         Regis Bill, MD Follow up in 1 month(s).   Specialty: Gastroenterology Contact information: [address] [place] [address] [phone]                Discharge Exam: Ceasar Mons Weights   12/10/22 2036 12/11/22 0422 12/13/22 0333  Weight: 85 kg 85 kg 89.3 kg   General exam: Appears calm and comfortable  Respiratory system: Clear to auscultation. Respiratory effort normal. Cardiovascular system: S1 & S2 heard, RRR. No JVD, murmurs, rubs, gallops or clicks. No pedal edema. Gastrointestinal system: Abdomen is nondistended, soft and nontender. No organomegaly or masses felt. Normal bowel  sounds heard. Central nervous system: Alert and oriented. No focal neurological deficits. Extremities: Symmetric 5 x 5 power. Skin: No rashes, lesions or ulcers Psychiatry: Judgement and insight appear normal. Mood & affect appropriate.    Condition at discharge: good  The results of significant diagnostics from this hospitalization (including imaging, microbiology, ancillary and laboratory) are listed below for reference.   Imaging Studies: ECHOCARDIOGRAM COMPLETE  Result Date: 12/13/2022    ECHOCARDIOGRAM REPORT   Patient Name:   JAMARRIE ANGELLO Date of Exam: 12/13/2022 Medical Rec #:  [id number]            Height:       69.0 in Accession #:    [id number]           Weight:       196.9 lb Date of Birth:  November 28, 1960            BSA:          2.052 m Patient Age:    62 years             BP:           151/64 mmHg Patient Gender: M                    HR:           80 bpm. Exam Location:  [facility] Procedure: 2D Echo, Cardiac Doppler and Color Doppler Indications:     Syncope  History:         Patient has prior history of Echocardiogram examinations, most                  recent 01/30/2022. CHF, COPD, Signs/Symptoms:Syncope; Risk                  Factors:Hypertension and Current Smoker. CKD.  Sonographer:     Mikki Harbor Referring Phys:  [id number] Marrion Coy Diagnosing Phys: Julien Nordmann MD  Sonographer Comments: Image acquisition challenging due to respiratory motion. IMPRESSIONS  1. Left ventricular ejection fraction, by estimation, is 60 to 65%. The left ventricle has normal function. The left ventricle has no regional wall motion abnormalities. Left ventricular diastolic parameters are consistent with Grade I diastolic dysfunction (impaired relaxation).  2. Right ventricular systolic function is normal. The right ventricular size is normal. There is normal pulmonary artery systolic pressure. The estimated right ventricular systolic pressure is 30.7 mmHg.  3. The mitral valve is normal in structure.  No evidence of mitral valve regurgitation. No evidence of mitral stenosis.  4. Tricuspid valve regurgitation is mild to moderate.  5. The aortic valve is tricuspid. Aortic valve regurgitation is not visualized.  No aortic stenosis is present.  6. The inferior vena cava is normal in size with greater than 50% respiratory variability, suggesting right atrial pressure of 3 mmHg. FINDINGS  Left Ventricle: Left ventricular ejection fraction, by estimation, is 60 to 65%. The left ventricle has normal function. The left ventricle has no regional wall motion abnormalities. The left ventricular internal cavity size was normal in size. There is  no left ventricular hypertrophy. Left ventricular diastolic parameters are consistent with Grade I diastolic dysfunction (impaired relaxation). Right Ventricle: The right ventricular size is normal. No increase in right ventricular wall thickness. Right ventricular systolic function is normal. There is normal pulmonary artery systolic pressure. The tricuspid regurgitant velocity is 2.63 m/s, and  with an assumed right atrial pressure of 3 mmHg, the estimated right ventricular systolic pressure is 30.7 mmHg. Left Atrium: Left atrial size was normal in size. Right Atrium: Right atrial size was normal in size. Pericardium: There is no evidence of pericardial effusion. Mitral Valve: The mitral valve is normal in structure. No evidence of mitral valve regurgitation. No evidence of mitral valve stenosis. MV peak gradient, 3.1 mmHg. The mean mitral valve gradient is 1.0 mmHg. Tricuspid Valve: The tricuspid valve is normal in structure. Tricuspid valve regurgitation is mild to moderate. No evidence of tricuspid stenosis. Aortic Valve: The aortic valve is tricuspid. Aortic valve regurgitation is not visualized. No aortic stenosis is present. Aortic valve mean gradient measures 5.0 mmHg. Aortic valve peak gradient measures 9.4 mmHg. Aortic valve area, by VTI measures 2.09 cm. Pulmonic Valve:  The pulmonic valve was normal in structure. Pulmonic valve regurgitation is not visualized. No evidence of pulmonic stenosis. Aorta: The aortic root is normal in size and structure. Venous: The inferior vena cava is normal in size with greater than 50% respiratory variability, suggesting right atrial pressure of 3 mmHg. IAS/Shunts: No atrial level shunt detected by color flow Doppler.  LEFT VENTRICLE PLAX 2D LVIDd:         4.80 cm   Diastology LVIDs:         3.20 cm   LV e' medial:    9.25 cm/s LV PW:         0.90 cm   LV E/e' medial:  7.8 LV IVS:        1.10 cm   LV e' lateral:   8.27 cm/s LVOT diam:     2.00 cm   LV E/e' lateral: 8.7 LV SV:         64 LV SV Index:   31 LVOT Area:     3.14 cm  RIGHT VENTRICLE RV Basal diam:  3.90 cm RV Mid diam:    3.40 cm RV S prime:     18.30 cm/s TAPSE (M-mode): 3.2 cm LEFT ATRIUM             Index        RIGHT ATRIUM           Index LA diam:        3.40 cm 1.66 cm/m   RA Area:     16.50 cm LA Vol (A2C):   62.9 ml 30.65 ml/m  RA Volume:   46.60 ml  22.71 ml/m LA Vol (A4C):   28.9 ml 14.08 ml/m LA Biplane Vol: 43.4 ml 21.15 ml/m  AORTIC VALVE                    PULMONIC VALVE AV Area (Vmax):    2.09 cm  PV Vmax:       1.20 m/s AV Area (Vmean):   2.21 cm     PV Peak grad:  5.8 mmHg AV Area (VTI):     2.09 cm AV Vmax:           153.00 cm/s AV Vmean:          98.300 cm/s AV VTI:            0.307 m AV Peak Grad:      9.4 mmHg AV Mean Grad:      5.0 mmHg LVOT Vmax:         102.00 cm/s LVOT Vmean:        69.100 cm/s LVOT VTI:          0.204 m LVOT/AV VTI ratio: 0.66  AORTA Ao Root diam: 3.30 cm MITRAL VALVE               TRICUSPID VALVE MV Area (PHT): 3.39 cm    TR Peak grad:   27.7 mmHg MV Area VTI:   2.80 cm    TR Vmax:        263.00 cm/s MV Peak grad:  3.1 mmHg MV Mean grad:  1.0 mmHg    SHUNTS MV Vmax:       0.87 m/s    Systemic VTI:  0.20 m MV Vmean:      52.5 cm/s   Systemic Diam: 2.00 cm MV Decel Time: 224 msec MV E velocity: 72.10 cm/s MV A velocity: 64.30 cm/s  MV E/A ratio:  1.12 Julien Nordmann MD Electronically signed by Julien Nordmann MD Signature Date/Time: 12/13/2022/4:08:48 PM    Final    DG Chest Port 1 View  Result Date: 12/12/2022 CLINICAL DATA:  Aspiration pneumonia EXAM: PORTABLE CHEST 1 VIEW COMPARISON:  12/11/2022 FINDINGS: Heart and mediastinal contours are within normal limits. No focal opacities or effusions. No acute bony abnormality. IMPRESSION: No active disease. Electronically Signed   By: Charlett Nose M.D.   On: 12/12/2022 19:26   DG ABD ACUTE 2+V W 1V CHEST  Result Date: 12/11/2022 CLINICAL DATA:  Abdominal pain EXAM: DG ABDOMEN ACUTE WITH 1 VIEW CHEST COMPARISON:  CT 11/26/2022 and 12/10/2022 FINDINGS: The lungs are symmetrically well expanded. Interval development of mild diffuse interstitial pulmonary infiltrate in keeping with interstitial pulmonary edema or airway inflammation. No pneumothorax or pleural effusion. Cardiac size within normal limits. Normal abdominal gas pattern.  No free intraperitoneal gas. Diffuse osseous sclerosis again noted, possibly related to changes of widespread osseous metastatic disease, hyperparathyroidism, myelofibrosis, mastocytosis, or renal osteodystrophy. Lumbar fusion with instrumentation again noted. IMPRESSION: 1. Interval development of mild diffuse interstitial pulmonary infiltrate in keeping with interstitial pulmonary edema or airway inflammation. 2. Normal abdominal gas pattern. 3. Diffuse osseous sclerosis again noted. Electronically Signed   By: Helyn Numbers M.D.   On: 12/11/2022 21:44   CT ABDOMEN PELVIS W CONTRAST  Addendum Date: 12/10/2022   ADDENDUM REPORT: 12/10/2022 18:04 COMPARISON:  11/26/2022 Intramuscular hematoma in the left abdominal oblique musculature demonstrates interval decrease in size. The small amount of hemorrhage extending into the peritoneum and tracking along the lateral margin of the spleen has also decreased in volume. Electronically Signed   By: Elige Ko M.D.    On: 12/10/2022 18:04   Result Date: 12/10/2022 CLINICAL DATA:  Abdominal pain EXAM: CT ABDOMEN AND PELVIS WITH CONTRAST TECHNIQUE: Multidetector CT imaging of the abdomen and pelvis was performed using the standard protocol following bolus administration of intravenous  contrast. RADIATION DOSE REDUCTION: This exam was performed according to the departmental dose-optimization program which includes automated exposure control, adjustment of the mA and/or kV according to patient size and/or use of iterative reconstruction technique. CONTRAST:  OMNIPAQUE IOHEXOL 300 MG/ML  SOLN COMPARISON:  None Available. FINDINGS: Lower chest: Patchy ground-glass opacities at the right lung base which may be secondary to an infectious or inflammatory etiology. Hepatobiliary: 4 mm left hepatic cyst. No other focal hepatic lesion. Distended gallbladder. No gallstones, gallbladder wall thickening, or biliary dilatation. Pancreas: Unremarkable. No pancreatic ductal dilatation or surrounding inflammatory changes. Spleen: Normal in size without focal abnormality. Adrenals/Urinary Tract: Adrenal glands are unremarkable. Kidneys are normal, without renal calculi, focal lesion, or hydronephrosis. Bladder is unremarkable. Stomach/Bowel: Stomach is within normal limits. No evidence of bowel wall thickening, distention, or inflammatory changes. Vascular/Lymphatic: No significant vascular findings are present. No enlarged abdominal or pelvic lymph nodes. Reproductive: Prostate is unremarkable. Other: No abdominal wall hernia or abnormality. No abdominopelvic ascites. Musculoskeletal: 14 x 5 x 8.9 cm intramuscular hematoma in the left abdominal oblique musculature. Small amount of hemorrhage extending into the peritoneum and tracking along the lateral margin of the spleen. No acute osseous abnormality. No aggressive osseous lesion. Mild osteoarthritis of the hips bilaterally. Diffuse osteosclerosis which may be secondary to diffuse  metastatic disease versus hyperparathyroidism, myelofibrosis or renal osteodystrophy. Posterior lumbar fusion from L4 through S1. IMPRESSION: 1. A 14 x 5 x 8.9 cm intramuscular hematoma in the left abdominal oblique musculature. Small amount of hemorrhage extending into the peritoneum and tracking along the lateral margin of the spleen. 2. Patchy ground-glass opacities at the right lung base which may be secondary to an infectious or inflammatory etiology. 3. Diffuse osteosclerosis which may be secondary to diffuse metastatic disease versus hyperparathyroidism, myelofibrosis or renal osteodystrophy amongst other etiologies. Overall unchanged compared with prior CT chest dated 07/05/2021. Electronically Signed: By: Elige Ko M.D. On: 12/10/2022 16:19   Overnight EEG with video  Result Date: 11/28/2022 [name], MD     11/28/2022  6:41 PM Patient Name: Dewight Fearnow MRN: [id number] Epilepsy Attending: Bing Neighbors MD Referring Physician/Provider: Ritta Slot MD Duration: 11/27/22 1307 to 11/28/22 0730  Patient history:  This is a 62 yo patient with spells who is undergoing EEG to evaluate for seizures.  AEDs during EEG study: alprazolam, gabapentin  Technical aspects: This EEG study was done with scalp electrodes positioned according to the 10-20 International system of electrode placement. Electrical activity was reviewed with band pass filter of 1-70Hz , sensitivity of 7 uV/mm, display speed of 96mm/sec with a 60Hz  notched filter applied as appropriate. EEG data were recorded continuously and digitally stored.  Video monitoring was available and reviewed as appropriate.  Description: The occipital dominant rhythm was 6-7 Hz. This activity is reactive to stimulation. Drowsiness was manifested by background fragmentation; deeper stages of sleep were identified by K complexes and sleep spindles. There was no focal slowing. There were no interictal epileptiform discharges. There were no  electrographic seizures identified. Photic stimulation and hyperventilation were not performed.  Event button was pushed 11/27/22 at 2120 for patient having "word salad." Concomitant eeg before, during and after the event didn't show any EEG change to suggest seizure.   ABNORMALITY -- Moderate diffuse slowing  IMPRESSION: This study is suggestive of moderate diffuse encephalopathy, nonspecific etiology. No seizures or epileptiform discharges were seen throughout the recording.  Event button was pushed 11/27/22 at 2120 for patient having "word salad." Concomitant eeg before,  during and after the event didn't show any EEG change to suggest seizure. Bing Neighbors, MD [facility] [phone] If 7pm- 7am, please page neurology on call as listed in AMION.    MR BRAIN W WO CONTRAST  Result Date: 11/27/2022 CLINICAL DATA:  Altered mental status EXAM: MRI HEAD WITHOUT AND WITH CONTRAST TECHNIQUE: Multiplanar, multiecho pulse sequences of the brain and surrounding structures were obtained without and with intravenous contrast. CONTRAST:  9mL GADAVIST GADOBUTROL 1 MMOL/ML IV SOLN COMPARISON:  None Available. FINDINGS: Brain: No acute infarct, mass effect or extra-axial collection. No acute or chronic hemorrhage. There is multifocal hyperintense T2-weighted signal within the white matter. Generalized volume loss. 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 The midline structures are normal. There is no abnormal contrast enhancement. Vascular: Major flow voids are preserved. Skull and upper cervical spine: Normal calvarium and skull base. Visualized upper cervical spine and soft tissues are normal. Sinuses/Orbits:No paranasal sinus fluid levels or advanced mucosal thickening. No mastoid or middle ear effusion. Normal orbits. IMPRESSION: 1. No acute intracranial abnormality. 2. 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 Electronically Signed   By: Deatra Robinson M.D.   On:  11/27/2022 19:21   MR CERVICAL SPINE WO CONTRAST  Result Date: 11/27/2022 CLINICAL DATA:  Neck pain, chronic.  Recent fall. EXAM: MRI CERVICAL SPINE WITHOUT CONTRAST TECHNIQUE: Multiplanar, multisequence MR imaging of the cervical spine was performed. No intravenous contrast was administered. COMPARISON:  Cervical spine CT 11/10/2021 FINDINGS: The study is mildly to moderately motion degraded. Alignment: Normal. Vertebrae: Nonspecific diffusely diminished bone marrow T1 signal intensity corresponding to previously described diffuse sclerosis on CT. Preserved vertebral body heights. Bulky, solid bridging anterior vertebral ossification from C4-C6. STIR hyperintensity/fluid signal in the C5-6 disc without other findings to strongly suggest acute cervical spine injury. Cord: Normal signal. Posterior Fossa, vertebral arteries, paraspinal tissues: No significant paraspinal soft tissue edema or prevertebral fluid. Posterior fossa more fully evaluated on the contemporaneous head MRI. Preserved vertebral artery flow voids. Disc levels: Moderate vertebral spurring and mild left facet arthrosis result in mild left neural foraminal stenosis without spinal stenosis. C3-4: A T2 hyperintense central disc protrusion results in mild spinal stenosis with minimal ventral cord flattening. Asymmetric right uncovertebral spurring and severe right facet arthrosis result in severe right neural foraminal stenosis. C4-5: A small central disc protrusion results in mild spinal stenosis. Patent neural foramina. C5-6: A broad-based posterior disc osteophyte complex and prominent bilateral uncovertebral spurring result in mild spinal stenosis and severe bilateral neural foraminal stenosis. C6-7: Severe disc space narrowing. Disc bulging and left greater than right uncovertebral spurring result in mild-to-moderate spinal stenosis and mild-to-moderate right and severe left neural foraminal stenosis. C7-T1: Negative. IMPRESSION: 1. Multilevel  cervical disc degeneration, worst at C6-7 where there is mild-to-moderate spinal stenosis and severe left neural foraminal stenosis. 2. Mild spinal stenosis and severe bilateral neural foraminal stenosis at C5-6. 3. Mild spinal stenosis and severe right neural foraminal stenosis at C3-4. Electronically Signed   By: Sebastian Ache M.D.   On: 11/27/2022 15:17   CT CHEST ABDOMEN PELVIS W CONTRAST  Result Date: 11/26/2022 CLINICAL DATA:  Polytrauma, blunt LEFT flank pain, bruising, suspect large hematoma EXAM: CT CHEST, ABDOMEN, AND PELVIS WITH CONTRAST TECHNIQUE: Multidetector CT imaging of the chest, abdomen and pelvis was performed following the standard protocol during bolus administration of intravenous contrast. RADIATION DOSE REDUCTION: This exam was performed according to the departmental dose-optimization program which includes automated  exposure control, adjustment of the mA and/or kV according to patient size and/or use of iterative reconstruction technique. CONTRAST:  OMNIPAQUE IOHEXOL 300 MG/ML  SOLN COMPARISON:  Chest CT 02/12/22 FINDINGS: CT CHEST FINDINGS Cardiovascular: No evidence of acute aortic or vascular injury. Mild aortic atherosclerosis. Noncalcified plaque in the proximal aortic branch vessels. The heart is normal in size. No pericardial effusion. Mediastinum/Nodes: No mediastinal hemorrhage or hematoma. No mediastinal adenopathy. No esophageal wall thickening. Minimal fluid in the mid esophagus. No pneumomediastinum. Lungs/Pleura: No pneumothorax. Moderate emphysema and bronchial thickening. No pulmonary contusion. No focal airspace disease. Areas of subsegmental atelectasis or scarring within the left upper and lower lobes. The ground-glass and consolidative opacities have otherwise resolved. Musculoskeletal: No acute fracture of the ribs, sternum, included clavicles or shoulder girdles. Degenerative change in the thoracic spine without acute fracture. There is diffuse sclerotic  appearance of the osseous structures. CT ABDOMEN PELVIS FINDINGS Hepatobiliary: No hepatic injury or perihepatic hematoma. Subcentimeter hypodensity in the left lobe of the liver is too small to characterize but likely cyst. No further follow-up imaging is recommended. Gallbladder is unremarkable. Pancreas: No evidence of pancreatic injury. No ductal dilatation or inflammation. Spleen: Stranding in the left upper quadrant is likely related to soft tissue injury, there is no evidence of splenic laceration or injury. Adrenals/Urinary Tract: No adrenal hemorrhage or renal injury identified. Small bilateral simple appearing renal cysts. No further follow-up imaging is recommended. Bladder is unremarkable. Stomach/Bowel: No evidence of bowel injury or mesenteric hematoma. Colonic diverticulosis without diverticulitis. Appendix is not visualized. Mild fluid-filled stomach without gastric wall thickening. Vascular/Lymphatic: Large left lateral abdominal wall/flank hematoma with small focus of active extravasation, series 2, image 72. Vascular blush persists on delayed phase. No evidence of aortic injury. There is no retroperitoneal fluid. Left retroaortic renal vein. No adenopathy. Reproductive: Prostate is unremarkable. Other: Large left abdominal wall/flank hematoma with focus of active extravasation, series 2, image 72. Active extravasation arises just inferior to the lateral left tenth rib. This measures approximately 14.1 x 6.2 x 13.7 cm, series 2, image 84 and series 5, image 37. There is thickening of the subjacent lateral fascia. No free fluid in the abdominopelvic cavity. No free air. Musculoskeletal: No acute fracture of the pelvis or lumbar spine. L4 through S1 fusion hardware. Diffuse bony sclerosis. IMPRESSION: 1. Large left lateral abdominal wall/flank hematoma with focus of active extravasation. No adjacent rib fracture. 2. No additional acute traumatic injury to the chest, abdomen, or pelvis. 3. Diffuse  sclerotic appearance of the osseous structures, a chronic finding. 4. Emphysema. 5. Colonic diverticulosis without diverticulitis. Aortic Atherosclerosis (ICD10-I70.0) and Emphysema (ICD10-J43.9). Critical Value/emergent results were called by telephone at the time of interpretation on 11/26/2022 at 7:36 pm to provider MARK QUALE , who verbally acknowledged these results. Electronically Signed   By: Narda Rutherford M.D.   On: 11/26/2022 19:41    Microbiology: Results for orders placed or performed during the hospital encounter of 12/10/22  Culture, blood (routine x 2)     Status: None (Preliminary result)   Collection Time: 12/10/22  4:52 PM   Specimen: BLOOD  Result Value Ref Range Status   Specimen Description BLOOD BLOOD RIGHT WRIST  Final   Special Requests   Final    BOTTLES DRAWN AEROBIC AND ANAEROBIC Blood Culture adequate volume   Culture   Final    NO GROWTH 4 DAYS Performed at [facility], [address], [place], [place] [address]    Report Status PENDING  Incomplete  SARS Coronavirus 2 by RT PCR (hospital order, performed in [facility] hospital lab) *cepheid single result test* Anterior Nasal Swab     Status: None   Collection Time: 12/10/22  5:44 PM   Specimen: Anterior Nasal Swab  Result Value Ref Range Status   SARS Coronavirus 2 by RT PCR NEGATIVE NEGATIVE Final    Comment: (NOTE) SARS-CoV-2 target nucleic acids are NOT DETECTED.  The SARS-CoV-2 RNA is generally detectable in upper and lower respiratory specimens during the acute phase of infection. The lowest concentration of SARS-CoV-2 viral copies this assay can detect is 250 copies / mL. A negative result does not preclude SARS-CoV-2 infection and should not be used as the sole basis for treatment or other patient management decisions.  A negative result may occur with improper specimen collection / handling, submission of specimen other than nasopharyngeal swab, presence of viral mutation(s) within  the areas targeted by this assay, and inadequate number of viral copies (<250 copies / mL). A negative result must be combined with clinical observations, patient history, and epidemiological information.  Fact Sheet for Patients:   RoadLapTop.co.za  Fact Sheet for Healthcare Providers: http://kim-miller.com/  This test is not yet approved or  cleared by the [place] FDA and has been authorized for detection and/or diagnosis of SARS-CoV-2 by FDA under an Emergency Use Authorization (EUA).  This EUA will remain in effect (meaning this test can be used) for the duration of the COVID-19 declaration under Section 564(b)(1) of the Act, 21 U.S.C. section 360bbb-3(b)(1), unless the authorization is terminated or revoked sooner.  Performed at [facility], [address] [place], [place] [address]   Culture, blood (routine x 2)     Status: None (Preliminary result)   Collection Time: 12/10/22  7:45 PM   Specimen: BLOOD RIGHT ARM  Result Value Ref Range Status   Specimen Description BLOOD RIGHT ARM  Final   Special Requests   Final    BOTTLES DRAWN AEROBIC AND ANAEROBIC Blood Culture adequate volume   Culture   Final    NO GROWTH 4 DAYS Performed at [facility], [address] [place], [place] [address]    Report Status PENDING  Incomplete  C Difficile Quick Screen w PCR reflex     Status: None   Collection Time: 12/12/22 12:53 PM   Specimen: STOOL  Result Value Ref Range Status   C Diff antigen NEGATIVE NEGATIVE Final   C Diff toxin NEGATIVE NEGATIVE Final   C Diff interpretation No C. difficile detected.  Final    Comment: Performed at [facility], [address] [place], [place] [address]  Gastrointestinal Panel by PCR , Stool     Status: None   Collection Time: 12/12/22  1:02 PM   Specimen: Stool  Result Value Ref Range Status   Campylobacter species NOT DETECTED NOT DETECTED Final    Plesimonas shigelloides NOT DETECTED NOT DETECTED Final   Salmonella species NOT DETECTED NOT DETECTED Final   Yersinia enterocolitica NOT DETECTED NOT DETECTED Final   Vibrio species NOT DETECTED NOT DETECTED Final   Vibrio cholerae NOT DETECTED NOT DETECTED Final   Enteroaggregative E coli (EAEC) NOT DETECTED NOT DETECTED Final   Enteropathogenic E coli (EPEC) NOT DETECTED NOT DETECTED Final   Enterotoxigenic E coli (ETEC) NOT DETECTED NOT DETECTED Final   Shiga like toxin producing E coli (STEC) NOT DETECTED NOT DETECTED Final   Shigella/Enteroinvasive E coli (EIEC) NOT DETECTED NOT DETECTED Final  Cryptosporidium NOT DETECTED NOT DETECTED Final   Cyclospora cayetanensis NOT DETECTED NOT DETECTED Final   Entamoeba histolytica NOT DETECTED NOT DETECTED Final   Giardia lamblia NOT DETECTED NOT DETECTED Final   Adenovirus F40/41 NOT DETECTED NOT DETECTED Final   Astrovirus NOT DETECTED NOT DETECTED Final   Norovirus GI/GII NOT DETECTED NOT DETECTED Final   Rotavirus A NOT DETECTED NOT DETECTED Final   Sapovirus (I, II, IV, and V) NOT DETECTED NOT DETECTED Final    Comment: Performed at [facility], [address] [place] [address] [place], [place] [address]  Culture, blood (Routine X 2) w Reflex to ID Panel     Status: None (Preliminary result)   Collection Time: 12/12/22  4:11 PM   Specimen: BLOOD  Result Value Ref Range Status   Specimen Description BLOOD BLOOD LEFT HAND  Final   Special Requests   Final    BOTTLES DRAWN AEROBIC AND ANAEROBIC Blood Culture adequate volume   Culture   Final    NO GROWTH 2 DAYS Performed at [facility], [address] [place], [place] [address]    Report Status PENDING  Incomplete  Culture, blood (Routine X 2) w Reflex to ID Panel     Status: None (Preliminary result)   Collection Time: 12/12/22  4:11 PM   Specimen: BLOOD  Result Value Ref Range Status   Specimen Description BLOOD BLOOD LEFT ARM  Final   Special Requests   Final     BOTTLES DRAWN AEROBIC ONLY Blood Culture results may not be optimal due to an inadequate volume of blood received in culture bottles   Culture   Final    NO GROWTH 2 DAYS Performed at [facility], [address], [place], [place] [address]    Report Status PENDING  Incomplete  SARS Coronavirus 2 by RT PCR (hospital order, performed in [facility] hospital lab) *cepheid single result test* Anterior Nasal Swab     Status: None   Collection Time: 12/12/22  4:14 PM   Specimen: Anterior Nasal Swab  Result Value Ref Range Status   SARS Coronavirus 2 by RT PCR NEGATIVE NEGATIVE Final    Comment: (NOTE) SARS-CoV-2 target nucleic acids are NOT DETECTED.  The SARS-CoV-2 RNA is generally detectable in upper and lower respiratory specimens during the acute phase of infection. The lowest concentration of SARS-CoV-2 viral copies this assay can detect is 250 copies / mL. A negative result does not preclude SARS-CoV-2 infection and should not be used as the sole basis for treatment or other patient management decisions.  A negative result may occur with improper specimen collection / handling, submission of specimen other than nasopharyngeal swab, presence of viral mutation(s) within the areas targeted by this assay, and inadequate number of viral copies (<250 copies / mL). A negative result must be combined with clinical observations, patient history, and epidemiological information.  Fact Sheet for Patients:   RoadLapTop.co.za  Fact Sheet for Healthcare Providers: http://kim-miller.com/  This test is not yet approved or  cleared by the [place] FDA and has been authorized for detection and/or diagnosis of SARS-CoV-2 by FDA under an Emergency Use Authorization (EUA).  This EUA will remain in effect (meaning this test can be used) for the duration of the COVID-19 declaration under Section 564(b)(1) of the Act, 21 U.S.C. section  360bbb-3(b)(1), unless the authorization is terminated or revoked sooner.  Performed at [facility], [address], [place], [place] [address]     Labs: CBC: Recent  Labs  Lab 12/10/22 1446 12/11/22 0533 12/12/22 1444 12/13/22 0318 12/14/22 0531  WBC 13.1* 5.6 7.8 4.5 5.5  HGB 11.3* 8.2* 9.8* 8.1* 8.3*  HCT 34.7* 23.7* 29.6* 24.3* 24.5*  MCV 97.2 96.0 96.7 97.6 97.2  PLT 455* 222 292 221 211   Basic Metabolic Panel: Recent Labs  Lab 12/10/22 1459 12/11/22 0533 12/12/22 0903 12/12/22 1611 12/13/22 0318 12/14/22 0531  NA  --  137 138 139 138 140  K  --  3.0* 3.3* 3.7 3.2* 4.4  CL  --  106 103 103 110 110  CO2  --  26 23 24 22 22   GLUCOSE  --  128* 118* 121* 91 90  BUN  --  11 6* 7* 6* 7*  CREATININE  --  1.39* 1.03 1.03 0.96 1.05  CALCIUM  --  8.6* 8.7* 9.2 8.4* 8.8*  MG 2.4 2.3 2.2  --  2.3 2.2   Liver Function Tests: Recent Labs  Lab 12/10/22 1446 12/11/22 0533  AST 22 15  ALT 19 15  ALKPHOS 195* 150*  BILITOT 1.5* 1.0  PROT 8.2* 6.2*  ALBUMIN 4.5 3.4*   CBG: Recent Labs  Lab 12/12/22 1514 12/12/22 2128 12/13/22 0818  GLUCAP 116* 119* 93    Discharge time spent: greater than 30 minutes.  Signed: Marrion Coy, MD [facility] 12/14/2022

## 2022-12-14 NOTE — Plan of Care (Signed)
  Problem: Pain Managment: Goal: General experience of comfort will improve Outcome: Progressing   Problem: Safety: Goal: Ability to remain free from injury will improve Outcome: Progressing   

## 2022-12-14 NOTE — TOC Transition Note (Signed)
Transition of Care [facility]) - CM/SW Discharge Note   Patient Details  Name: Henry Owens MRN: [id number] Date of Birth: 05/22/1960  Transition of Care [facility]) CM/SW Contact:  Margarito Liner, LCSW Phone Number: 12/14/2022, 10:44 AM   Clinical Narrative:   Patient has orders to discharge home today. No further concerns. CSW signing off.  Final next level of care: Home/Self Care Barriers to Discharge: Barriers Resolved   Patient Goals and CMS Choice      Discharge Placement                  Patient to be transferred to facility by: Wife   Patient and family notified of of transfer: 12/14/22  Discharge Plan and Services Additional resources added to the After Visit Summary for                                       Social Determinants of Health (SDOH) Interventions SDOH Screenings   Food Insecurity: No Food Insecurity (12/11/2022)  Housing: Low Risk  (12/11/2022)  Transportation Needs: No Transportation Needs (12/11/2022)  Utilities: Not At Risk (12/11/2022)  Financial Resource Strain: Patient Declined (06/26/2022)   [facility] from [facility]  Tobacco Use: Medium Risk (12/07/2022)   Received from [facility]     Readmission Risk Interventions    12/11/2022   12:22 PM 07/10/2021    2:37 PM  Readmission Risk Prevention Plan  Transportation Screening Complete Complete  PCP or Specialist Appt within 5-7 Days  Complete  PCP or Specialist Appt within 3-5 Days Complete   Home Care Screening  Complete  Medication Review (RN CM)  Complete  HRI or Home Care Consult Complete   Social Work Consult for Recovery Care Planning/Counseling Complete   Palliative Care Screening Not Applicable   Medication Review Oceanographer) Complete

## 2022-12-14 NOTE — Plan of Care (Signed)
 IV removed, discharge instructions reviewed and patient discharged to home with wife

## 2022-12-15 LAB — CULTURE, BLOOD (ROUTINE X 2)
Culture: NO GROWTH
Culture: NO GROWTH
Special Requests: ADEQUATE
Special Requests: ADEQUATE

## 2022-12-16 DIAGNOSIS — J449 Chronic obstructive pulmonary disease, unspecified: Secondary | ICD-10-CM | POA: Diagnosis not present

## 2022-12-17 LAB — CULTURE, BLOOD (ROUTINE X 2)
Culture: NO GROWTH
Culture: NO GROWTH
Special Requests: ADEQUATE

## 2022-12-21 DIAGNOSIS — J9601 Acute respiratory failure with hypoxia: Secondary | ICD-10-CM | POA: Diagnosis not present

## 2022-12-21 DIAGNOSIS — G894 Chronic pain syndrome: Secondary | ICD-10-CM | POA: Diagnosis not present

## 2022-12-21 DIAGNOSIS — J189 Pneumonia, unspecified organism: Secondary | ICD-10-CM | POA: Diagnosis not present

## 2022-12-26 DIAGNOSIS — R059 Cough, unspecified: Secondary | ICD-10-CM | POA: Diagnosis not present

## 2022-12-26 DIAGNOSIS — N250 Renal osteodystrophy: Secondary | ICD-10-CM | POA: Diagnosis not present

## 2023-01-01 DIAGNOSIS — M542 Cervicalgia: Secondary | ICD-10-CM | POA: Diagnosis not present

## 2023-01-01 DIAGNOSIS — R569 Unspecified convulsions: Secondary | ICD-10-CM | POA: Diagnosis not present

## 2023-01-01 DIAGNOSIS — R262 Difficulty in walking, not elsewhere classified: Secondary | ICD-10-CM | POA: Diagnosis not present

## 2023-01-01 DIAGNOSIS — R2689 Other abnormalities of gait and mobility: Secondary | ICD-10-CM | POA: Diagnosis not present

## 2023-01-01 DIAGNOSIS — R404 Transient alteration of awareness: Secondary | ICD-10-CM | POA: Diagnosis not present

## 2023-01-01 DIAGNOSIS — R251 Tremor, unspecified: Secondary | ICD-10-CM | POA: Diagnosis not present

## 2023-01-01 DIAGNOSIS — R32 Unspecified urinary incontinence: Secondary | ICD-10-CM | POA: Diagnosis not present

## 2023-01-01 DIAGNOSIS — M5441 Lumbago with sciatica, right side: Secondary | ICD-10-CM | POA: Diagnosis not present

## 2023-01-01 DIAGNOSIS — G8929 Other chronic pain: Secondary | ICD-10-CM | POA: Diagnosis not present

## 2023-01-01 DIAGNOSIS — Z8673 Personal history of transient ischemic attack (TIA), and cerebral infarction without residual deficits: Secondary | ICD-10-CM | POA: Diagnosis not present

## 2023-01-01 DIAGNOSIS — R29898 Other symptoms and signs involving the musculoskeletal system: Secondary | ICD-10-CM | POA: Diagnosis not present

## 2023-01-22 ENCOUNTER — Encounter: Payer: Medicaid Other | Admitting: Internal Medicine

## 2023-01-22 ENCOUNTER — Encounter: Payer: Self-pay | Admitting: Oncology

## 2023-01-22 ENCOUNTER — Other Ambulatory Visit: Payer: Medicaid Other

## 2023-01-22 ENCOUNTER — Inpatient Hospital Stay: Payer: Medicaid Other | Attending: Oncology | Admitting: Oncology

## 2023-01-22 ENCOUNTER — Inpatient Hospital Stay: Payer: Medicaid Other

## 2023-01-22 VITALS — BP 125/93 | HR 68 | Temp 98.1°F | Resp 16 | Ht 66.0 in | Wt 186.0 lb

## 2023-01-22 DIAGNOSIS — R898 Other abnormal findings in specimens from other organs, systems and tissues: Secondary | ICD-10-CM

## 2023-01-22 DIAGNOSIS — R937 Abnormal findings on diagnostic imaging of other parts of musculoskeletal system: Secondary | ICD-10-CM | POA: Insufficient documentation

## 2023-01-22 DIAGNOSIS — R9389 Abnormal findings on diagnostic imaging of other specified body structures: Secondary | ICD-10-CM

## 2023-01-22 DIAGNOSIS — Z87891 Personal history of nicotine dependence: Secondary | ICD-10-CM | POA: Diagnosis not present

## 2023-01-22 DIAGNOSIS — Z862 Personal history of diseases of the blood and blood-forming organs and certain disorders involving the immune mechanism: Secondary | ICD-10-CM | POA: Insufficient documentation

## 2023-01-22 LAB — FOLATE: Folate: 8.4 ng/mL (ref >5.9)

## 2023-01-22 LAB — CBC WITH DIFFERENTIAL/PLATELET
Abs Immature Granulocytes: 0.03 10*3/uL (ref 0.00–0.07)
Basophils Absolute: 0.1 10*3/uL (ref 0.0–0.1)
Basophils Relative: 1 %
Eosinophils Absolute: 0.2 10*3/uL (ref 0.0–0.5)
Eosinophils Relative: 3 %
HCT: 46.7 % (ref 39.0–52.0)
Hemoglobin: 14.7 g/dL (ref 13.0–17.0)
Immature Granulocytes: 0 %
Lymphocytes Relative: 23 %
Lymphs Abs: 1.7 10*3/uL (ref 0.7–4.0)
MCH: 30.8 pg (ref 26.0–34.0)
MCHC: 31.5 g/dL (ref 30.0–36.0)
MCV: 97.9 fL (ref 80.0–100.0)
Monocytes Absolute: 0.4 10*3/uL (ref 0.1–1.0)
Monocytes Relative: 6 %
Neutro Abs: 5 10*3/uL (ref 1.7–7.7)
Neutrophils Relative %: 67 %
Platelets: 235 10*3/uL (ref 150–400)
RBC: 4.77 MIL/uL (ref 4.22–5.81)
RDW: 13.8 % (ref 11.5–15.5)
WBC: 7.4 10*3/uL (ref 4.0–10.5)
nRBC: 0 % (ref 0.0–0.2)

## 2023-01-22 LAB — COMPREHENSIVE METABOLIC PANEL
ALT: 29 U/L (ref 0–44)
AST: 16 U/L (ref 15–41)
Albumin: 4.6 g/dL (ref 3.5–5.0)
Alkaline Phosphatase: 164 U/L — ABNORMAL HIGH (ref 38–126)
Anion gap: 8 (ref 5–15)
BUN: 14 mg/dL (ref 8–23)
CO2: 26 mmol/L (ref 22–32)
Calcium: 9.8 mg/dL (ref 8.9–10.3)
Chloride: 105 mmol/L (ref 98–111)
Creatinine, Ser: 1.05 mg/dL (ref 0.61–1.24)
GFR, Estimated: >60 mL/min (ref >60)
Glucose, Bld: 130 mg/dL — ABNORMAL HIGH (ref 70–99)
Potassium: 4.5 mmol/L (ref 3.5–5.1)
Sodium: 139 mmol/L (ref 135–145)
Total Bilirubin: 0.5 mg/dL (ref 0.3–1.2)
Total Protein: 7.7 g/dL (ref 6.5–8.1)

## 2023-01-22 LAB — FERRITIN: Ferritin: 124 ng/mL (ref 24–336)

## 2023-01-22 LAB — IRON AND TIBC
Iron: 79 ug/dL (ref 45–182)
Saturation Ratios: 26 % (ref 17.9–39.5)
TIBC: 305 ug/dL (ref 250–450)
UIBC: 226 ug/dL

## 2023-01-22 LAB — VITAMIN B12: Vitamin B-12: 661 pg/mL (ref 180–914)

## 2023-01-22 NOTE — Progress Notes (Signed)
[facility]  [phone] [phone]  ID: Zeth Sodergren OB: 1960/07/10  MR#: [id number]  LOV#:[id number]  Patient Care Team: Kandyce Rud, MD as PCP - General (Family Medicine)  CHIEF COMPLAINT: Abnormal bone marrow signal.  INTERVAL HISTORY: Patient is a 62 year old male with multiple medical problems who was last evaluated in clinic greater than 2-1/2 years ago.  He is referred back for further evaluation.  He has no new neurologic complaints.  He denies any recent fevers.  He has a good appetite and denies weight loss.  He has no chest pain, shortness of breath, cough, or hemoptysis.  He denies any nausea, vomiting, constipation, or diarrhea.  He has no urinary complaints.  Patient offers no further specific complaints today.  REVIEW OF SYSTEMS:   Review of Systems  Constitutional: Negative.  Negative for fever, malaise/fatigue and weight loss.  Respiratory: Negative.  Negative for cough, hemoptysis and shortness of breath.   Cardiovascular: Negative.  Negative for chest pain and leg swelling.  Gastrointestinal: Negative.  Negative for abdominal pain.  Genitourinary: Negative.  Negative for dysuria.  Musculoskeletal:  Positive for back pain and falls.  Skin: Negative.  Negative for rash.  Neurological: Negative.  Negative for dizziness, focal weakness, weakness and headaches.  Psychiatric/Behavioral: Negative.  The patient is not nervous/anxious.     As per HPI. Otherwise, a complete review of systems is negative.  PAST MEDICAL HISTORY: Past Medical History:  Diagnosis Date   Anxiety    Arthritis    Flu 07/2017   Hyperlipidemia    Hypertension    Intractable nausea and vomiting 12/11/2022   Nodule of right lung    Tinnitus     PAST SURGICAL HISTORY: Past Surgical History:  Procedure Laterality Date   APPENDECTOMY  1975   BACK SURGERY     KNEE ARTHROSCOPY WITH MEDIAL MENISECTOMY Right 08/20/2017   Procedure: KNEE  ARTHROSCOPY WITH PARTIAL MEDIAL AND LATERAL MENISECTOMY,PARTIAL SYNOVECTOMY,CHONDROPLASTY, LOOSE BODY REMOVAL;  Surgeon: Lyndle Herrlich, MD;  Location: [facility] ORS;  Service: Orthopedics;  Laterality: Right;   KNEE SURGERY  1975   1996, 2000    FAMILY HISTORY: Family History  Problem Relation Age of Onset   Hypertension Mother    Stroke Mother    Cancer Mother        colon   Stroke Father    Bladder Cancer Neg Hx    Prostate cancer Neg Hx    Hematuria Neg Hx     ADVANCED DIRECTIVES (Y/N):  N  HEALTH MAINTENANCE: Social History   Tobacco Use   Smoking status: Former    Current packs/day: 1.50    Average packs/day: 1.5 packs/day for 25.0 years (37.5 ttl pk-yrs)    Types: Cigarettes   Smokeless tobacco: Never  Vaping Use   Vaping status: Never Used  Substance Use Topics   Alcohol use: No   Drug use: Not Currently     Colonoscopy:  PAP:  Bone density:  Lipid panel:  Allergies  Allergen Reactions   Wound Dressing Adhesive Other (See Comments)    BLISTER   Azithromycin Rash   Penicillin G Rash    Has patient had a PCN reaction causing immediate rash, facial/tongue/throat swelling, SOB or lightheadedness with hypotension: Yes Has patient had a PCN reaction causing severe rash involving mucus membranes or skin necrosis: No Has patient had a PCN reaction that required hospitalization: No Has patient had a PCN reaction occurring within the last 10 years: No If all of  the above answers are "NO", then may proceed with Cephalosporin use.     Current Outpatient Medications  Medication Sig Dispense Refill   acetaminophen (TYLENOL) 500 MG tablet Take 1,000 mg by mouth in the morning, at noon, and at bedtime.     ALPRAZolam (XANAX) 0.25 MG tablet Take 0.25 mg by mouth 2 (two) times daily as needed for anxiety or sleep.     amLODipine (NORVASC) 5 MG tablet Take 5 mg by mouth daily.     aspirin EC 81 MG tablet Take 81 mg by mouth daily. Swallow whole.     atenolol (TENORMIN)  50 MG tablet Take 25 mg by mouth daily.     atorvastatin (LIPITOR) 10 MG tablet Take 10 mg by mouth every evening.     budesonide-formoterol (SYMBICORT) 80-4.5 MCG/ACT inhaler Inhale 2 puffs into the lungs in the morning and at bedtime.     celecoxib (CELEBREX) 100 MG capsule Take 100 mg by mouth 2 (two) times daily.     Cholecalciferol (D 1000) 25 MCG (1000 UT) capsule Take 1,000 Units by mouth daily.     cyanocobalamin 1000 MCG tablet Take 1 tablet (1,000 mcg total) by mouth daily. 30 tablet 0   fluticasone (FLONASE) 50 MCG/ACT nasal spray Place 1 spray into both nostrils daily as needed for allergies or rhinitis.     gabapentin (NEURONTIN) 600 MG tablet Take 900 mg by mouth 2 (two) times daily.     levETIRAcetam (KEPPRA) 500 MG tablet Take 500 mg by mouth 2 (two) times daily.     lisinopril (ZESTRIL) 10 MG tablet Take 10 mg by mouth daily.     Pancrelipase, Lip-Prot-Amyl, (CREON) 24000-76000 units CPEP Take 1 capsule (24,000 Units total) by mouth 3 (three) times daily before meals. 180 capsule 0   pantoprazole (PROTONIX) 40 MG tablet Take 1 tablet (40 mg total) by mouth daily. 30 tablet 2   tiZANidine (ZANAFLEX) 4 MG tablet Take 4 mg by mouth 3 (three) times daily.     venlafaxine XR (EFFEXOR-XR) 150 MG 24 hr capsule Take 150 mg by mouth daily with breakfast.     oxyCODONE 10 MG TABS Take 0.5-1 tablets (5-10 mg total) by mouth every 6 (six) hours as needed for severe pain or moderate pain. (Patient not taking: Reported on 01/22/2023) 20 tablet 0   polyethylene glycol (MIRALAX / GLYCOLAX) 17 g packet Take 17 g by mouth daily as needed (constipation). (Patient not taking: Reported on 01/22/2023)     No current facility-administered medications for this visit.    OBJECTIVE: Vitals:   01/22/23 1017  BP: (!) 125/93  Pulse: 68  Resp: 16  Temp: 98.1 F (36.7 C)  SpO2: 97%     Body mass index is 30.02 kg/m.    ECOG FS:1 - Symptomatic but completely ambulatory  General: Well-developed,  well-nourished, no acute distress. Eyes: Pink conjunctiva, anicteric sclera. HEENT: Normocephalic, moist mucous membranes. Lungs: No audible wheezing or coughing. Heart: Regular rate and rhythm. Abdomen: Soft, nontender, no obvious distention. Musculoskeletal: No edema, cyanosis, or clubbing. Neuro: Alert, answering all questions appropriately. Cranial nerves grossly intact. Skin: No rashes or petechiae noted. Psych: Normal affect. Lymphatics: No cervical, calvicular, axillary or inguinal LAD.   LAB RESULTS:  Lab Results  Component Value Date   NA 139 01/22/2023   K 4.5 01/22/2023   CL 105 01/22/2023   CO2 26 01/22/2023   GLUCOSE 130 (H) 01/22/2023   BUN 14 01/22/2023   CREATININE 1.05 01/22/2023  CALCIUM 9.8 01/22/2023   PROT 7.7 01/22/2023   ALBUMIN 4.6 01/22/2023   AST 16 01/22/2023   ALT 29 01/22/2023   ALKPHOS 164 (H) 01/22/2023   BILITOT 0.5 01/22/2023   GFRNONAA >60 01/22/2023    Lab Results  Component Value Date   WBC 7.4 01/22/2023   NEUTROABS 5.0 01/22/2023   HGB 14.7 01/22/2023   HCT 46.7 01/22/2023   MCV 97.9 01/22/2023   PLT 235 01/22/2023     STUDIES: No results found.  ASSESSMENT: Abnormal bone marrow signal.  PLAN:    Abnormal bone marrow signal: Full workup 2 and half years ago was unrevealing.  Patient's most recent imaging revealed unchanged bone marrow signal.  Patient has a mildly elevated alkaline phosphatase level which is essentially unchanged since February 2022.  CBC is within normal limits.  Myeloma panel is pending at time of dictation.  Will get a nuclear med bone scan for completeness.  No further intervention is needed.  Patient does not require bone marrow biopsy.  Patient will have a video assisted telemedicine visit 1 week after his bone scan for further evaluation and discussion of the results. Anemia: Resolved.   I spent a total of 30 minutes reviewing chart data, face-to-face evaluation with the patient, counseling and  coordination of care as detailed above.    Patient expressed understanding and was in agreement with this plan. He also understands that He can call clinic at any time with any questions, concerns, or complaints.    Cancer Staging  No matching staging information was found for the patient.   Jeralyn Ruths, MD   01/22/2023 1:04 PM

## 2023-01-23 DIAGNOSIS — G894 Chronic pain syndrome: Secondary | ICD-10-CM | POA: Diagnosis not present

## 2023-01-23 DIAGNOSIS — J449 Chronic obstructive pulmonary disease, unspecified: Secondary | ICD-10-CM | POA: Diagnosis not present

## 2023-01-23 DIAGNOSIS — I10 Essential (primary) hypertension: Secondary | ICD-10-CM | POA: Diagnosis not present

## 2023-01-23 DIAGNOSIS — J9601 Acute respiratory failure with hypoxia: Secondary | ICD-10-CM | POA: Diagnosis not present

## 2023-01-23 DIAGNOSIS — D649 Anemia, unspecified: Secondary | ICD-10-CM | POA: Diagnosis not present

## 2023-01-23 LAB — IGG, IGA, IGM
IgA: 131 mg/dL (ref 61–437)
IgG (Immunoglobin G), Serum: 699 mg/dL (ref 603–1613)
IgM (Immunoglobulin M), Srm: 34 mg/dL (ref 20–172)

## 2023-01-23 LAB — KAPPA/LAMBDA LIGHT CHAINS
Kappa free light chain: 111.3 mg/L — ABNORMAL HIGH (ref 3.3–19.4)
Kappa, lambda light chain ratio: 8.7 — ABNORMAL HIGH (ref 0.26–1.65)
Lambda free light chains: 12.8 mg/L (ref 5.7–26.3)

## 2023-01-24 DIAGNOSIS — M5136 Other intervertebral disc degeneration, lumbar region with discogenic back pain only: Secondary | ICD-10-CM | POA: Diagnosis not present

## 2023-01-24 DIAGNOSIS — M8580 Other specified disorders of bone density and structure, unspecified site: Secondary | ICD-10-CM | POA: Diagnosis not present

## 2023-01-24 DIAGNOSIS — M5416 Radiculopathy, lumbar region: Secondary | ICD-10-CM | POA: Diagnosis not present

## 2023-01-24 DIAGNOSIS — M5137 Other intervertebral disc degeneration, lumbosacral region with discogenic back pain only: Secondary | ICD-10-CM | POA: Diagnosis not present

## 2023-01-24 DIAGNOSIS — Z981 Arthrodesis status: Secondary | ICD-10-CM | POA: Diagnosis not present

## 2023-01-24 DIAGNOSIS — M5134 Other intervertebral disc degeneration, thoracic region: Secondary | ICD-10-CM | POA: Diagnosis not present

## 2023-01-24 LAB — PROTEIN ELECTROPHORESIS, SERUM
A/G Ratio: 1.5 (ref 0.7–1.7)
Albumin ELP: 4.2 g/dL (ref 2.9–4.4)
Alpha-1-Globulin: 0.3 g/dL (ref 0.0–0.4)
Alpha-2-Globulin: 0.8 g/dL (ref 0.4–1.0)
Beta Globulin: 1 g/dL (ref 0.7–1.3)
Gamma Globulin: 0.6 g/dL (ref 0.4–1.8)
Globulin, Total: 2.8 g/dL (ref 2.2–3.9)
Total Protein ELP: 7 g/dL (ref 6.0–8.5)

## 2023-01-25 DIAGNOSIS — M5136 Other intervertebral disc degeneration, lumbar region with discogenic back pain only: Secondary | ICD-10-CM | POA: Diagnosis not present

## 2023-01-25 DIAGNOSIS — M8580 Other specified disorders of bone density and structure, unspecified site: Secondary | ICD-10-CM | POA: Diagnosis not present

## 2023-01-25 DIAGNOSIS — Z981 Arthrodesis status: Secondary | ICD-10-CM | POA: Diagnosis not present

## 2023-01-25 DIAGNOSIS — M5134 Other intervertebral disc degeneration, thoracic region: Secondary | ICD-10-CM | POA: Diagnosis not present

## 2023-01-25 DIAGNOSIS — M5137 Other intervertebral disc degeneration, lumbosacral region with discogenic back pain only: Secondary | ICD-10-CM | POA: Diagnosis not present

## 2023-01-29 ENCOUNTER — Encounter: Payer: Self-pay | Admitting: Family Medicine

## 2023-01-30 ENCOUNTER — Ambulatory Visit
Admission: RE | Admit: 2023-01-30 | Discharge: 2023-01-30 | Disposition: A | Discharge status: Home / Self Care | Payer: Medicaid Other | Source: Ambulatory Visit | Attending: Oncology | Admitting: Oncology

## 2023-01-30 DIAGNOSIS — R898 Other abnormal findings in specimens from other organs, systems and tissues: Secondary | ICD-10-CM | POA: Diagnosis not present

## 2023-01-30 DIAGNOSIS — R9389 Abnormal findings on diagnostic imaging of other specified body structures: Secondary | ICD-10-CM | POA: Diagnosis not present

## 2023-01-30 MED ORDER — TECHNETIUM TC 99M MEDRONATE IV KIT
20.0000 | PACK | Freq: Once | INTRAVENOUS | Status: AC | PRN
  Administered 2023-01-30: 21.44 via INTRAVENOUS

## 2023-02-05 ENCOUNTER — Telehealth: Payer: Self-pay | Admitting: *Deleted

## 2023-02-05 NOTE — Telephone Encounter (Signed)
Patient called reporting that for the past month he has been having drenching night sweats and was told to call Dr Orlie Dakin to let hm know about this. He has a video visit tomorrow @ 11 AM He also reports that something abnormal showed up on his thoracic area per Physiatry.  Of note patient reports recent fall injuring his right shoulder, elbow, hip and neck. Per note from [facility], he blacked out.

## 2023-02-05 NOTE — Telephone Encounter (Signed)
I called Radiology and spoke with MJ and she will let them know we need result for tomorrows appointment

## 2023-02-06 ENCOUNTER — Encounter: Payer: Self-pay | Admitting: Oncology

## 2023-02-06 ENCOUNTER — Inpatient Hospital Stay: Payer: Medicaid Other | Attending: Oncology | Admitting: Oncology

## 2023-02-06 VITALS — BP 146/95 | HR 86 | Temp 98.5°F | Resp 16 | Ht 66.0 in | Wt 190.0 lb

## 2023-02-06 DIAGNOSIS — R779 Abnormality of plasma protein, unspecified: Secondary | ICD-10-CM | POA: Insufficient documentation

## 2023-02-06 NOTE — Progress Notes (Signed)
[facility]  [phone] [phone]  ID: Henry Owens OB: 1960/06/21  MR#: [id number]  AOZ#:[id number]  Patient Care Team: Kandyce Rud, MD as PCP - General (Family Medicine)  CHIEF COMPLAINT: Elevated kappa free light chains.  INTERVAL HISTORY: Patient returns to clinic today for routine evaluation and discussion of his laboratory and imaging results.  He continues to have multiple medical complaints that are all unchanged in nature.  He also reports night sweats nearly every day for the last 4 to 5 weeks.  He has no new neurologic complaints.  He denies any recent fevers.  He has a good appetite and denies weight loss.  He has no chest pain, shortness of breath, cough, or hemoptysis.  He denies any nausea, vomiting, constipation, or diarrhea.  He has no urinary complaints.  Patient offers no further specific complaints today.  REVIEW OF SYSTEMS:   Review of Systems  Constitutional:  Positive for diaphoresis. Negative for fever, malaise/fatigue and weight loss.  Respiratory: Negative.  Negative for cough, hemoptysis and shortness of breath.   Cardiovascular: Negative.  Negative for chest pain and leg swelling.  Gastrointestinal: Negative.  Negative for abdominal pain.  Genitourinary: Negative.  Negative for dysuria.  Musculoskeletal:  Positive for back pain and falls.  Skin: Negative.  Negative for rash.  Neurological: Negative.  Negative for dizziness, focal weakness, weakness and headaches.  Psychiatric/Behavioral: Negative.  The patient is not nervous/anxious.     As per HPI. Otherwise, a complete review of systems is negative.  PAST MEDICAL HISTORY: Past Medical History:  Diagnosis Date   Anxiety    Arthritis    Flu 07/2017   Hyperlipidemia    Hypertension    Intractable nausea and vomiting 12/11/2022   Nodule of right lung    Tinnitus     PAST SURGICAL HISTORY: Past Surgical History:  Procedure Laterality Date    APPENDECTOMY  1975   BACK SURGERY     KNEE ARTHROSCOPY WITH MEDIAL MENISECTOMY Right 08/20/2017   Procedure: KNEE ARTHROSCOPY WITH PARTIAL MEDIAL AND LATERAL MENISECTOMY,PARTIAL SYNOVECTOMY,CHONDROPLASTY, LOOSE BODY REMOVAL;  Surgeon: Lyndle Herrlich, MD;  Location: [facility] ORS;  Service: Orthopedics;  Laterality: Right;   KNEE SURGERY  1975   1996, 2000    FAMILY HISTORY: Family History  Problem Relation Age of Onset   Hypertension Mother    Stroke Mother    Cancer Mother        colon   Stroke Father    Bladder Cancer Neg Hx    Prostate cancer Neg Hx    Hematuria Neg Hx     ADVANCED DIRECTIVES (Y/N):  N  HEALTH MAINTENANCE: Social History   Tobacco Use   Smoking status: Former    Current packs/day: 1.50    Average packs/day: 1.5 packs/day for 25.0 years (37.5 ttl pk-yrs)    Types: Cigarettes   Smokeless tobacco: Never  Vaping Use   Vaping status: Never Used  Substance Use Topics   Alcohol use: No   Drug use: Not Currently     Colonoscopy:  PAP:  Bone density:  Lipid panel:  Allergies  Allergen Reactions   Wound Dressing Adhesive Other (See Comments)    BLISTER   Azithromycin Rash   Penicillin G Rash    Has patient had a PCN reaction causing immediate rash, facial/tongue/throat swelling, SOB or lightheadedness with hypotension: Yes Has patient had a PCN reaction causing severe rash involving mucus membranes or skin necrosis: No Has patient had a PCN  reaction that required hospitalization: No Has patient had a PCN reaction occurring within the last 10 years: No If all of the above answers are "NO", then may proceed with Cephalosporin use.     Current Outpatient Medications  Medication Sig Dispense Refill   acetaminophen (TYLENOL) 500 MG tablet Take 1,000 mg by mouth in the morning, at noon, and at bedtime.     ALPRAZolam (XANAX) 0.25 MG tablet Take 0.25 mg by mouth 2 (two) times daily as needed for anxiety or sleep.     aspirin EC 81 MG tablet Take 81 mg by  mouth daily. Swallow whole.     atenolol (TENORMIN) 50 MG tablet Take 25 mg by mouth daily.     atorvastatin (LIPITOR) 10 MG tablet Take 10 mg by mouth every evening.     budesonide-formoterol (SYMBICORT) 80-4.5 MCG/ACT inhaler Inhale 2 puffs into the lungs in the morning and at bedtime.     celecoxib (CELEBREX) 100 MG capsule Take 100 mg by mouth 2 (two) times daily.     Cholecalciferol (D 1000) 25 MCG (1000 UT) capsule Take 1,000 Units by mouth daily.     cyanocobalamin 1000 MCG tablet Take 1 tablet (1,000 mcg total) by mouth daily. 30 tablet 0   fluticasone (FLONASE) 50 MCG/ACT nasal spray Place 1 spray into both nostrils daily as needed for allergies or rhinitis.     gabapentin (NEURONTIN) 600 MG tablet Take 900 mg by mouth 2 (two) times daily.     HYDROcodone-acetaminophen (NORCO/VICODIN) 5-325 MG tablet Take 1 tablet by mouth 2 (two) times daily as needed.     levETIRAcetam (KEPPRA) 500 MG tablet Take 500 mg by mouth 2 (two) times daily.     lisinopril (ZESTRIL) 10 MG tablet Take 10 mg by mouth daily.     Pancrelipase, Lip-Prot-Amyl, (CREON) 24000-76000 units CPEP Take 1 capsule (24,000 Units total) by mouth 3 (three) times daily before meals. 180 capsule 0   pantoprazole (PROTONIX) 40 MG tablet Take 1 tablet (40 mg total) by mouth daily. 30 tablet 2   tiZANidine (ZANAFLEX) 4 MG tablet Take 4 mg by mouth 3 (three) times daily.     venlafaxine XR (EFFEXOR-XR) 150 MG 24 hr capsule Take 150 mg by mouth daily with breakfast.     amLODipine (NORVASC) 5 MG tablet Take 5 mg by mouth daily. (Patient not taking: Reported on 02/06/2023)     polyethylene glycol (MIRALAX / GLYCOLAX) 17 g packet Take 17 g by mouth daily as needed (constipation). (Patient not taking: Reported on 01/22/2023)     No current facility-administered medications for this visit.    OBJECTIVE: Vitals:   02/06/23 1054  BP: (!) 146/95  Pulse: 86  Resp: 16  Temp: 98.5 F (36.9 C)  SpO2: 97%     Body mass index is 30.67  kg/m.    ECOG FS:1 - Symptomatic but completely ambulatory  General: Well-developed, well-nourished, no acute distress. Eyes: Pink conjunctiva, anicteric sclera. HEENT: Normocephalic, moist mucous membranes. Lungs: No audible wheezing or coughing. Heart: Regular rate and rhythm. Abdomen: Soft, nontender, no obvious distention. Musculoskeletal: No edema, cyanosis, or clubbing. Neuro: Alert, answering all questions appropriately. Cranial nerves grossly intact. Skin: No rashes or petechiae noted. Psych: Normal affect.  LAB RESULTS:  Lab Results  Component Value Date   NA 139 01/22/2023   K 4.5 01/22/2023   CL 105 01/22/2023   CO2 26 01/22/2023   GLUCOSE 130 (H) 01/22/2023   BUN 14 01/22/2023   CREATININE 1.05  01/22/2023   CALCIUM 9.8 01/22/2023   PROT 7.7 01/22/2023   ALBUMIN 4.6 01/22/2023   AST 16 01/22/2023   ALT 29 01/22/2023   ALKPHOS 164 (H) 01/22/2023   BILITOT 0.5 01/22/2023   GFRNONAA >60 01/22/2023    Lab Results  Component Value Date   WBC 7.4 01/22/2023   NEUTROABS 5.0 01/22/2023   HGB 14.7 01/22/2023   HCT 46.7 01/22/2023   MCV 97.9 01/22/2023   PLT 235 01/22/2023     STUDIES: NM Bone Scan Whole Body  Result Date: 02/05/2023 CLINICAL DATA:  Abnormal CT scan. EXAM: NUCLEAR MEDICINE WHOLE BODY BONE SCAN TECHNIQUE: Whole body anterior and posterior images were obtained approximately 3 hours after intravenous injection of radiopharmaceutical. RADIOPHARMACEUTICALS:  21.4 mCi Technetium-63m MDP IV COMPARISON:  None Available. FINDINGS: No focal radiotracer accumulation within axillary or appendicular skeleton to suggest metastasis. No evidence trauma. Postsurgical change in the lower lumbar spine. IMPRESSION: No evidence skeletal metastasis. Electronically Signed   By: Genevive Bi M.D.   On: 02/05/2023 12:29    ASSESSMENT: Elevated kappa free light chains.  PLAN:    Elevated kappa free light chains: Likely clinically insignificant.  Kappa free light  chains are 111.3.  Patient does not have an M spike on SPEP and his immunoglobulins are within normal limits.  He has no evidence of endorgan damage.  No intervention is needed at this time.  Patient does not require bone marrow biopsy.  Return to clinic in 1 year with repeat laboratory work and further evaluation. Abnormal bone marrow signal: Insignificant.  Patient has had a full workup x 2 that was unrevealing.  Nuclear med bone scan completed on February 05, 2023 reviewed independently and reported as above with no evidence of skeletal metastasis.  No intervention is needed at this time. Elevated alkaline phosphatase: Mild.  Does not appear to be originating from bone. Night sweats: Unclear etiology.  Patient noted to have patchy groundglass appearance of base of lungs on CT of the abdomen and pelvis completed December 10, 2022.  Patient has follow-up with his primary care in approximately 2 weeks and plans to further discuss at that time.  Can possibly consider dedicated chest imaging if patient remains symptomatic and clinically relevant at that time.   Patient expressed understanding and was in agreement with this plan. He also understands that He can call clinic at any time with any questions, concerns, or complaints.    Jeralyn Ruths, MD   02/06/2023 11:44 AM

## 2023-02-12 DIAGNOSIS — R569 Unspecified convulsions: Secondary | ICD-10-CM | POA: Diagnosis not present

## 2023-02-13 DIAGNOSIS — R569 Unspecified convulsions: Secondary | ICD-10-CM | POA: Diagnosis not present

## 2023-02-14 DIAGNOSIS — R569 Unspecified convulsions: Secondary | ICD-10-CM | POA: Diagnosis not present

## 2023-02-22 DIAGNOSIS — Z87891 Personal history of nicotine dependence: Secondary | ICD-10-CM | POA: Diagnosis not present

## 2023-02-22 DIAGNOSIS — Z09 Encounter for follow-up examination after completed treatment for conditions other than malignant neoplasm: Secondary | ICD-10-CM | POA: Diagnosis not present

## 2023-02-22 DIAGNOSIS — Z800 Family history of malignant neoplasm of digestive organs: Secondary | ICD-10-CM | POA: Diagnosis not present

## 2023-02-23 DIAGNOSIS — M5412 Radiculopathy, cervical region: Secondary | ICD-10-CM | POA: Diagnosis not present

## 2023-02-23 DIAGNOSIS — M4802 Spinal stenosis, cervical region: Secondary | ICD-10-CM | POA: Diagnosis not present

## 2023-02-27 DIAGNOSIS — R404 Transient alteration of awareness: Secondary | ICD-10-CM | POA: Diagnosis not present

## 2023-02-27 DIAGNOSIS — R262 Difficulty in walking, not elsewhere classified: Secondary | ICD-10-CM | POA: Diagnosis not present

## 2023-02-27 DIAGNOSIS — M545 Low back pain, unspecified: Secondary | ICD-10-CM | POA: Diagnosis not present

## 2023-02-27 DIAGNOSIS — M542 Cervicalgia: Secondary | ICD-10-CM | POA: Diagnosis not present

## 2023-02-27 DIAGNOSIS — R569 Unspecified convulsions: Secondary | ICD-10-CM | POA: Diagnosis not present

## 2023-02-27 DIAGNOSIS — R32 Unspecified urinary incontinence: Secondary | ICD-10-CM | POA: Diagnosis not present

## 2023-02-27 DIAGNOSIS — R29898 Other symptoms and signs involving the musculoskeletal system: Secondary | ICD-10-CM | POA: Diagnosis not present

## 2023-02-27 DIAGNOSIS — G8929 Other chronic pain: Secondary | ICD-10-CM | POA: Diagnosis not present

## 2023-02-27 DIAGNOSIS — Z8673 Personal history of transient ischemic attack (TIA), and cerebral infarction without residual deficits: Secondary | ICD-10-CM | POA: Diagnosis not present

## 2023-02-27 DIAGNOSIS — R2689 Other abnormalities of gait and mobility: Secondary | ICD-10-CM | POA: Diagnosis not present

## 2023-03-01 ENCOUNTER — Other Ambulatory Visit: Payer: Self-pay | Admitting: Neurology

## 2023-03-01 DIAGNOSIS — R531 Weakness: Secondary | ICD-10-CM

## 2023-03-01 DIAGNOSIS — H02401 Unspecified ptosis of right eyelid: Secondary | ICD-10-CM

## 2023-03-01 DIAGNOSIS — I639 Cerebral infarction, unspecified: Secondary | ICD-10-CM

## 2023-03-01 DIAGNOSIS — R4701 Aphasia: Secondary | ICD-10-CM

## 2023-03-02 ENCOUNTER — Ambulatory Visit
Admission: RE | Admit: 2023-03-02 | Discharge: 2023-03-02 | Disposition: A | Discharge status: Home / Self Care | Payer: Medicaid Other | Source: Ambulatory Visit | Attending: Neurology | Admitting: Neurology

## 2023-03-02 DIAGNOSIS — R930 Abnormal findings on diagnostic imaging of skull and head, not elsewhere classified: Secondary | ICD-10-CM | POA: Diagnosis not present

## 2023-03-02 DIAGNOSIS — I639 Cerebral infarction, unspecified: Secondary | ICD-10-CM

## 2023-03-02 DIAGNOSIS — I6782 Cerebral ischemia: Secondary | ICD-10-CM | POA: Diagnosis not present

## 2023-03-02 DIAGNOSIS — G319 Degenerative disease of nervous system, unspecified: Secondary | ICD-10-CM | POA: Diagnosis not present

## 2023-03-02 DIAGNOSIS — R4701 Aphasia: Secondary | ICD-10-CM

## 2023-03-02 DIAGNOSIS — R531 Weakness: Secondary | ICD-10-CM

## 2023-03-02 DIAGNOSIS — H02401 Unspecified ptosis of right eyelid: Secondary | ICD-10-CM

## 2023-03-03 ENCOUNTER — Emergency Department
Admission: EM | Admit: 2023-03-03 | Discharge: 2023-03-03 | Disposition: A | Discharge status: Home / Self Care | Payer: Medicaid Other | Attending: Emergency Medicine | Admitting: Emergency Medicine

## 2023-03-03 ENCOUNTER — Other Ambulatory Visit: Payer: Self-pay

## 2023-03-03 ENCOUNTER — Emergency Department: Payer: Medicaid Other

## 2023-03-03 ENCOUNTER — Encounter: Payer: Self-pay | Admitting: *Deleted

## 2023-03-03 DIAGNOSIS — R531 Weakness: Secondary | ICD-10-CM | POA: Diagnosis not present

## 2023-03-03 DIAGNOSIS — I10 Essential (primary) hypertension: Secondary | ICD-10-CM | POA: Diagnosis not present

## 2023-03-03 DIAGNOSIS — R0902 Hypoxemia: Secondary | ICD-10-CM | POA: Diagnosis not present

## 2023-03-03 DIAGNOSIS — I959 Hypotension, unspecified: Secondary | ICD-10-CM | POA: Diagnosis not present

## 2023-03-03 DIAGNOSIS — R55 Syncope and collapse: Secondary | ICD-10-CM | POA: Diagnosis not present

## 2023-03-03 LAB — CBC WITH DIFFERENTIAL/PLATELET
Abs Immature Granulocytes: 0.05 10*3/uL (ref 0.00–0.07)
Basophils Absolute: 0.1 10*3/uL (ref 0.0–0.1)
Basophils Relative: 1 %
Eosinophils Absolute: 0.3 10*3/uL (ref 0.0–0.5)
Eosinophils Relative: 2 %
HCT: 44.5 % (ref 39.0–52.0)
Hemoglobin: 14.2 g/dL (ref 13.0–17.0)
Immature Granulocytes: 0 %
Lymphocytes Relative: 17 %
Lymphs Abs: 2.2 10*3/uL (ref 0.7–4.0)
MCH: 30.1 pg (ref 26.0–34.0)
MCHC: 31.9 g/dL (ref 30.0–36.0)
MCV: 94.3 fL (ref 80.0–100.0)
Monocytes Absolute: 0.8 10*3/uL (ref 0.1–1.0)
Monocytes Relative: 6 %
Neutro Abs: 9.9 10*3/uL — ABNORMAL HIGH (ref 1.7–7.7)
Neutrophils Relative %: 74 %
Platelets: 190 10*3/uL (ref 150–400)
RBC: 4.72 MIL/uL (ref 4.22–5.81)
RDW: 14 % (ref 11.5–15.5)
WBC: 13.2 10*3/uL — ABNORMAL HIGH (ref 4.0–10.5)
nRBC: 0 % (ref 0.0–0.2)

## 2023-03-03 LAB — COMPREHENSIVE METABOLIC PANEL
ALT: 34 U/L (ref 0–44)
AST: 18 U/L (ref 15–41)
Albumin: 4.1 g/dL (ref 3.5–5.0)
Alkaline Phosphatase: 140 U/L — ABNORMAL HIGH (ref 38–126)
Anion gap: 6 (ref 5–15)
BUN: 14 mg/dL (ref 8–23)
CO2: 25 mmol/L (ref 22–32)
Calcium: 8.9 mg/dL (ref 8.9–10.3)
Chloride: 106 mmol/L (ref 98–111)
Creatinine, Ser: 1.17 mg/dL (ref 0.61–1.24)
GFR, Estimated: >60 mL/min (ref >60)
Glucose, Bld: 94 mg/dL (ref 70–99)
Potassium: 4.2 mmol/L (ref 3.5–5.1)
Sodium: 137 mmol/L (ref 135–145)
Total Bilirubin: 0.5 mg/dL (ref <1.2)
Total Protein: 6.8 g/dL (ref 6.5–8.1)

## 2023-03-03 LAB — TROPONIN I (HIGH SENSITIVITY): Troponin I (High Sensitivity): 5 ng/L (ref <18)

## 2023-03-03 LAB — CK: Total CK: 32 U/L — ABNORMAL LOW (ref 49–397)

## 2023-03-03 NOTE — ED Notes (Signed)
EDP into room, at BS.  ?

## 2023-03-03 NOTE — ED Triage Notes (Signed)
BIB [facility] from home for syncope at home w/o fall witnessed by spouse. Initial BP was 60/40 for EMS, and pt was diaphoretic. Mentions HA. Denies other pains, or other sx. BS 135. NSL 18g L AC. BP improved to 104/57 PTA. Initial SPO2 88% RA, up to 100% on 4L [place]. Pt arrives alert, NAD, calm, interactive, reps e/u, speaking in clear complete sentences. Takes BB and Keppra. MRI done yesterday. Mentions stroke like sx last week.

## 2023-03-03 NOTE — ED Provider Notes (Signed)
[facility] Provider Note   Event Date/Time   First MD Initiated Contact with Patient 03/03/23 1833     (approximate) History  Loss of Consciousness  HPI Henry Owens is a 62 y.o. male with a past medical history of hypertension, hyperlipidemia, chronic neck and back pain, and tobacco abuse who presents after an episode of syncope at home witnessed by the spouse.  Patient states that he was sitting on the couch waiting for dinner when he became extremely tired and lost consciousness.  Upon EMS arrival, initial BP was 60/40 and patient was diaphoretic.  Patient did mention a headache previous to this loss of consciousness.  Patient's blood pressure improved to 104/57 prior to arrival with minimal fluids via IV.  Patient was initially satting 88% on room air and improved to 100% on 4 L nasal cannula.  Patient denies any complaints at this time and states that he is taking all medications on time and as prescribed without any change in his medications over the last 2 weeks. ROS: Patient currently denies any vision changes, tinnitus, difficulty speaking, facial droop, sore throat, chest pain, shortness of breath, abdominal pain, nausea/vomiting/diarrhea, dysuria, or weakness/numbness/paresthesias in any extremity   Physical Exam  Triage Vital Signs: ED Triage Vitals  Encounter Vitals Group     BP      Systolic BP Percentile      Diastolic BP Percentile      Pulse      Resp      Temp      Temp src      SpO2      Weight      Height      Head Circumference      Peak Flow      Pain Score      Pain Loc      Pain Education      Exclude from Growth Chart    Most recent vital signs: Vitals:   03/03/23 1853 03/03/23 2030  BP:  (!) 153/78  Pulse: 66 66  Resp: 12 12  Temp:    SpO2: 97% 98%   General: Awake, oriented x4. CV:  Good peripheral perfusion.  Resp:  Normal effort.  Abd:  No distention.  Other:  Middle-aged obese Caucasian male resting  comfortably in no acute distress ED Results / Procedures / Treatments  Labs (all labs ordered are listed, but only abnormal results are displayed) Labs Reviewed  COMPREHENSIVE METABOLIC PANEL - Abnormal; Notable for the following components:      Result Value   Alkaline Phosphatase 140 (*)    All other components within normal limits  CBC WITH DIFFERENTIAL/PLATELET - Abnormal; Notable for the following components:   WBC 13.2 (*)    Neutro Abs 9.9 (*)    All other components within normal limits  CK - Abnormal; Notable for the following components:   Total CK 32 (*)    All other components within normal limits  URINALYSIS, ROUTINE W REFLEX MICROSCOPIC  TROPONIN I (HIGH SENSITIVITY)  TROPONIN I (HIGH SENSITIVITY)   EKG ED ECG REPORT I, Merwyn Katos, the attending physician, personally viewed and interpreted this ECG. Date: 03/03/2023 EKG Time: 1837 Rate: 71 Rhythm: normal sinus rhythm QRS Axis: normal Intervals: normal ST/T Wave abnormalities: normal Narrative Interpretation: no evidence of acute ischemia RADIOLOGY ED MD interpretation: One-view portable chest x-ray interpreted by me shows no evidence of acute abnormalities including no pneumonia, pneumothorax, or widened mediastinum -Agree with radiology  assessment Official radiology report(s): DG Chest Port 1 View  Result Date: 03/03/2023 CLINICAL DATA:  Syncope EXAM: PORTABLE CHEST 1 VIEW COMPARISON:  Chest x-ray 12/12/2022 FINDINGS: The heart size and mediastinal contours are within normal limits. Both lungs are clear. The visualized skeletal structures are unremarkable. IMPRESSION: No active disease. Electronically Signed   By: Darliss Cheney M.D.   On: 03/03/2023 20:17   PROCEDURES: Critical Care performed: No .1-3 Lead EKG Interpretation  Performed by: Merwyn Katos, MD Authorized by: Merwyn Katos, MD     Interpretation: normal     ECG rate:  71   ECG rate assessment: normal     Rhythm: sinus rhythm      Ectopy: none     Conduction: normal    MEDICATIONS ORDERED IN ED: Medications - No data to display IMPRESSION / MDM / ASSESSMENT AND PLAN / ED COURSE  I reviewed the triage vital signs and the nursing notes.                             The patient is on the cardiac monitor to evaluate for evidence of arrhythmia and/or significant heart rate changes. Patient's presentation is most consistent with acute presentation with potential threat to life or bodily function. Patient presents with complaints of syncope/presyncope ED Workup:  CBC, BMP, Troponin, BNP, ECG, CXR Differential diagnosis includes HF, ICH, seizure, stroke, HOCM, ACS, aortic dissection, malignant arrhythmia, or GI bleed. Findings: No evidence of acute laboratory abnormalities.  Troponin negative x1 EKG: No e/o STEMI. No evidence of Brugada's sign, delta wave, epsilon wave, significantly prolonged QTc, or malignant arrhythmia.  Disposition: Discharge. Patient is at baseline at this time. Return precautions expressed and understood in person. Advised follow up with primary care provider or clinic physician in next 24 hours.   FINAL CLINICAL IMPRESSION(S) / ED DIAGNOSES   Final diagnoses:  Syncope and collapse  Recurrent syncope   Rx / DC Orders   ED Discharge Orders          Ordered    Ambulatory referral to Cardiology       Comments: If you have not heard from the Cardiology office within the next 72 hours please call [phone].   03/03/23 2028           Note:  This document was prepared using Dragon voice recognition software and may include unintentional dictation errors.   Merwyn Katos, MD 03/03/23 2104

## 2023-03-03 NOTE — ED Notes (Signed)
EMS IVF NS 500cc infusing/ continues

## 2023-03-12 DIAGNOSIS — R55 Syncope and collapse: Secondary | ICD-10-CM | POA: Diagnosis not present

## 2023-03-12 DIAGNOSIS — R404 Transient alteration of awareness: Secondary | ICD-10-CM | POA: Diagnosis not present

## 2023-03-12 DIAGNOSIS — I10 Essential (primary) hypertension: Secondary | ICD-10-CM | POA: Diagnosis not present

## 2023-03-12 DIAGNOSIS — Z8673 Personal history of transient ischemic attack (TIA), and cerebral infarction without residual deficits: Secondary | ICD-10-CM | POA: Diagnosis not present

## 2023-03-18 IMAGING — DX DG ABD PORTABLE 1V
1 series · 2 of 2 positions shown · non-contrast
Comparison: 07/06/2021

CLINICAL DATA: Orogastric tube placement

EXAM:
PORTABLE ABDOMEN - 1 VIEW

[Series 1: abdomen supine · 0.14mm/px · 2 of 2 slices shown]
[im 1/2]
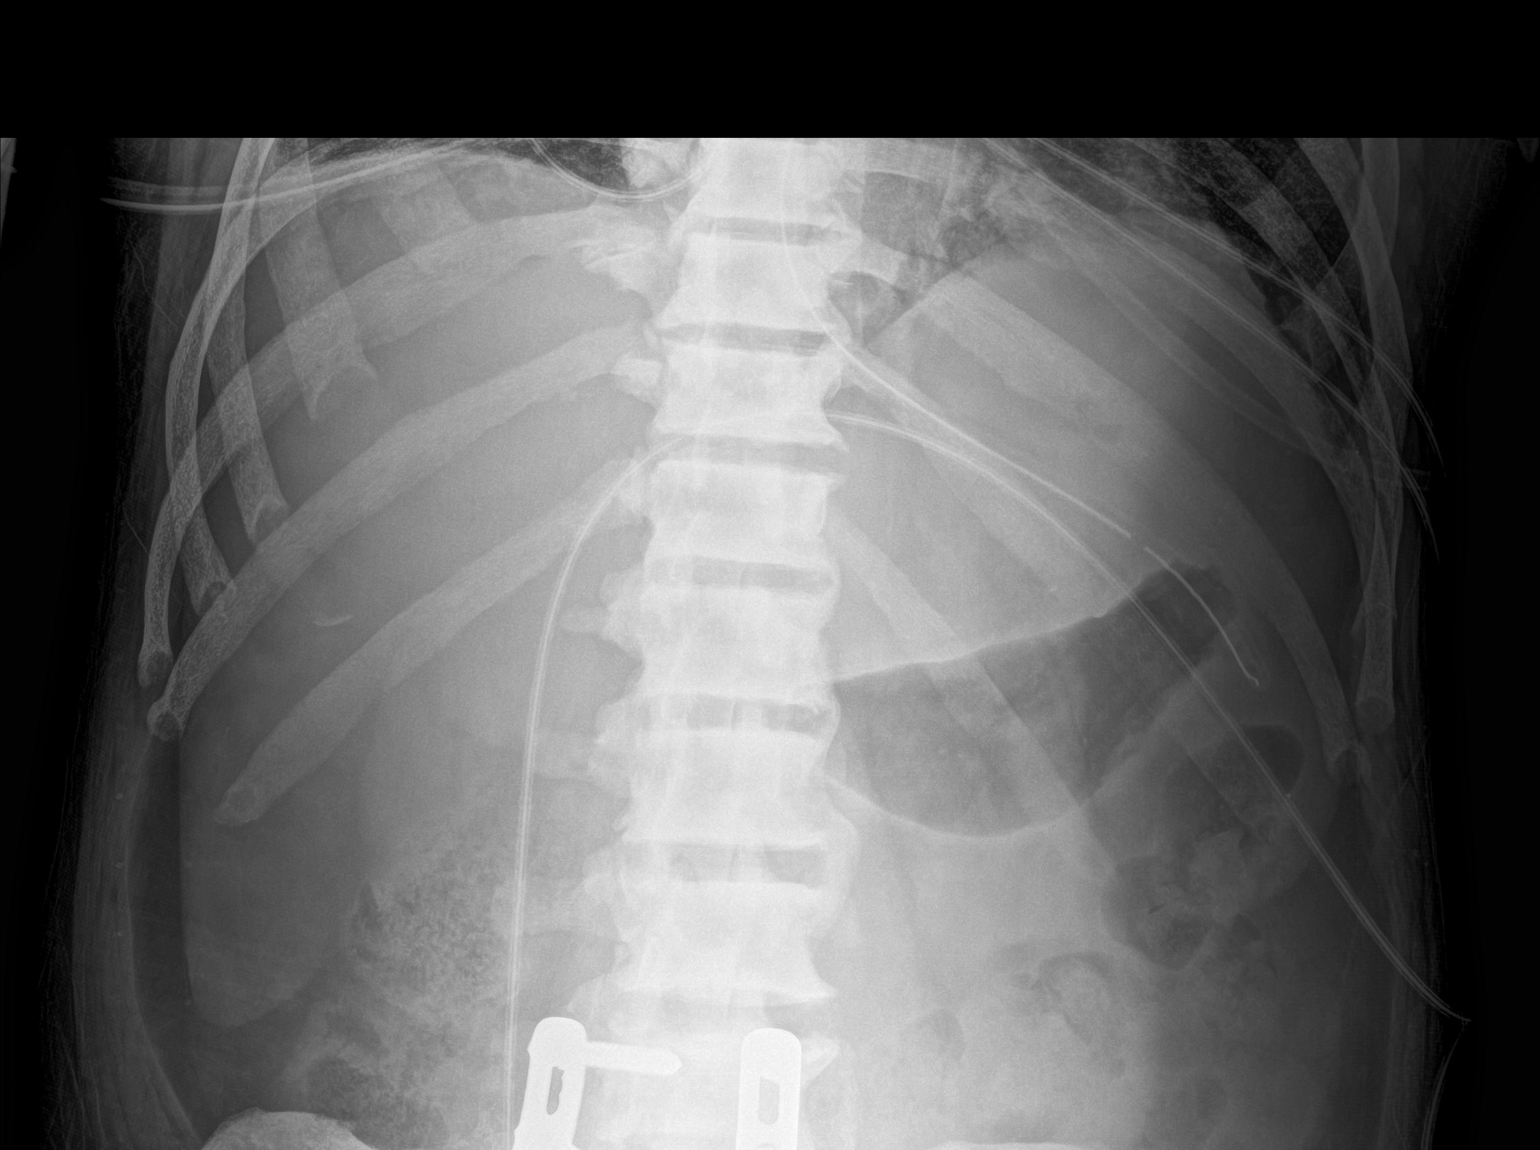
[im 2/2]
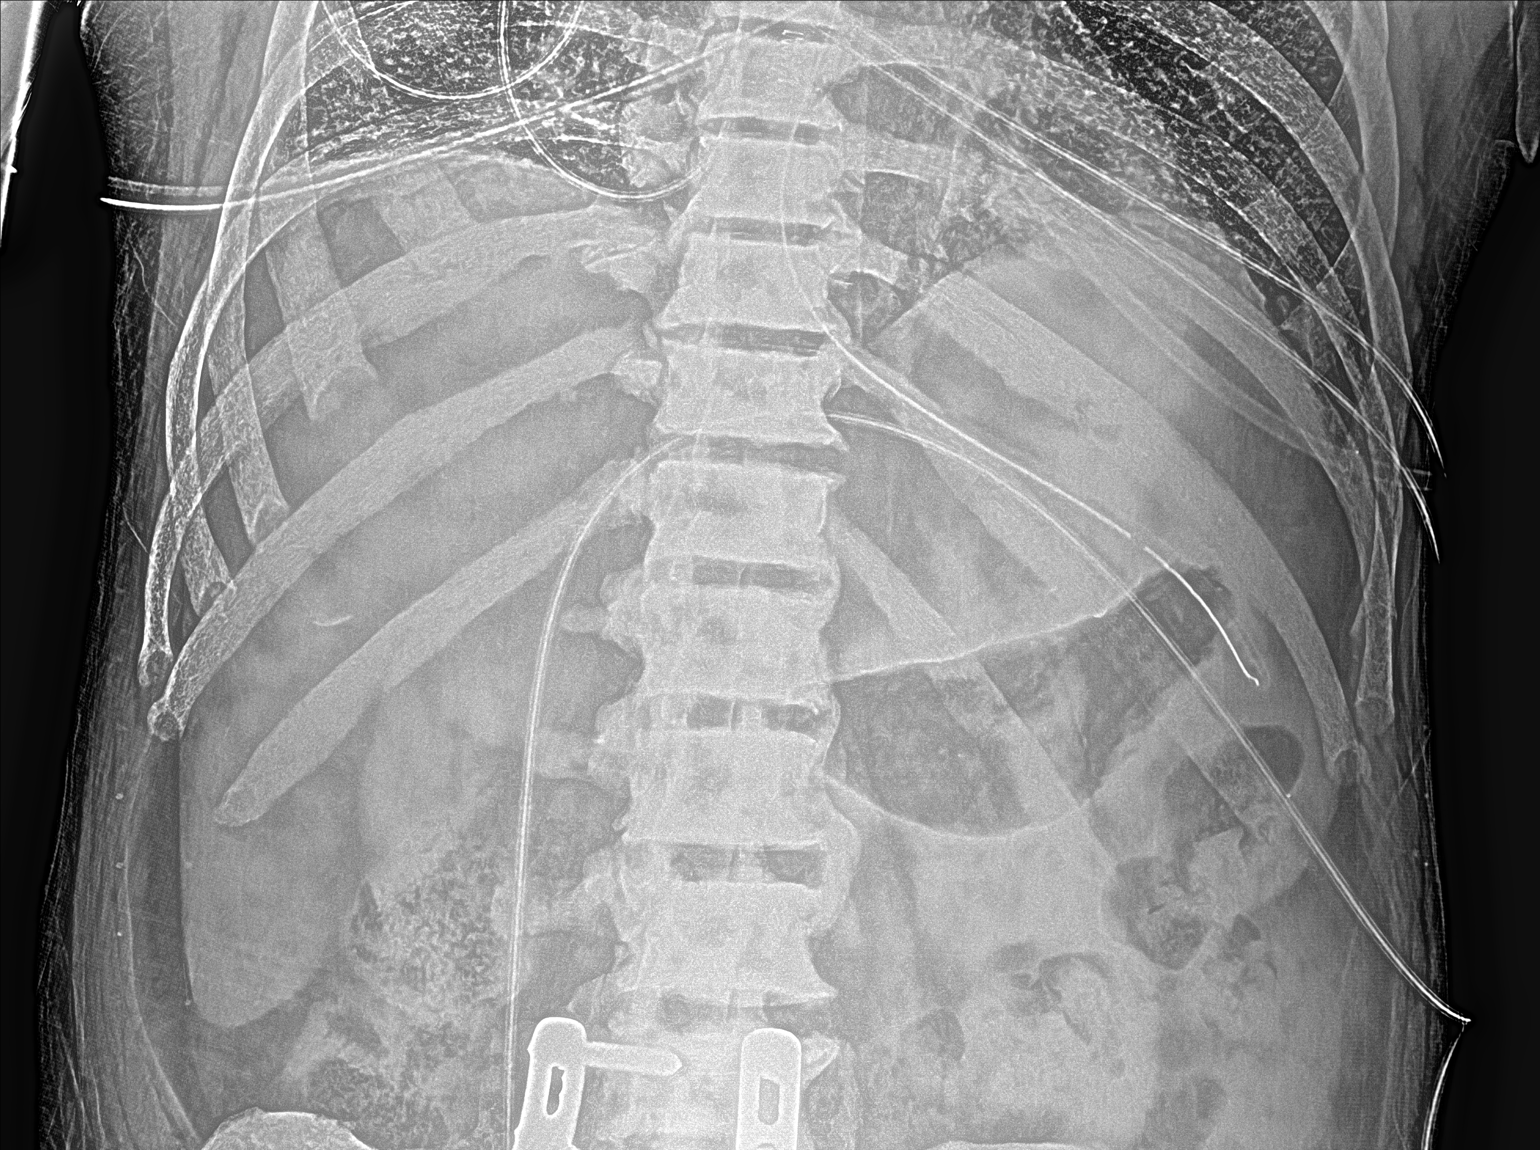

[2 of 2 positions shown; findings below may reference images not displayed]

FINDINGS: Limited radiograph of the lower chest and upper abdomen was obtained
for the purposes of enteric tube localization. Enteric tube is seen
coursing below the diaphragm with distal tip and side port
terminating within the expected location of the gastric body.
Densely sclerotic appearance of the bony structures as can be seen
with renal osteodystrophy.
IMPRESSION: Enteric tube tip and side port project within the expected location
of the gastric body.

## 2023-03-18 IMAGING — CT CT HEAD W/O CM
4 series · 17 of 47 positions shown, 19 images · non-contrast
Comparison: Head CT 07/05/2021.

CLINICAL DATA: 61-year-old male unresponsive patient.



[Series 2: head wo · axial · 0.42mm/px · z∈[-130,+0]mm · 7 of 36 slices shown, 9 images]
[im 5/36  brain]
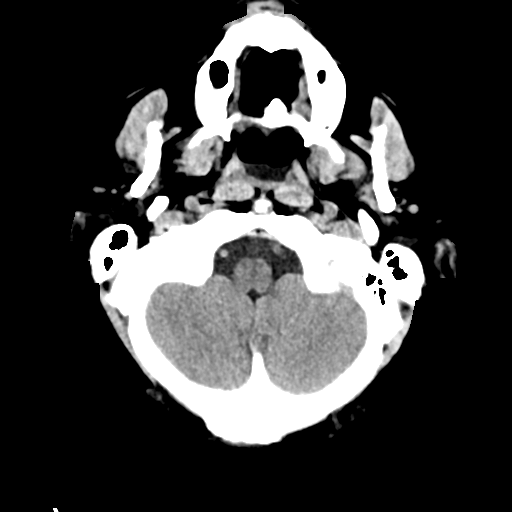
[im 5/36  bone]
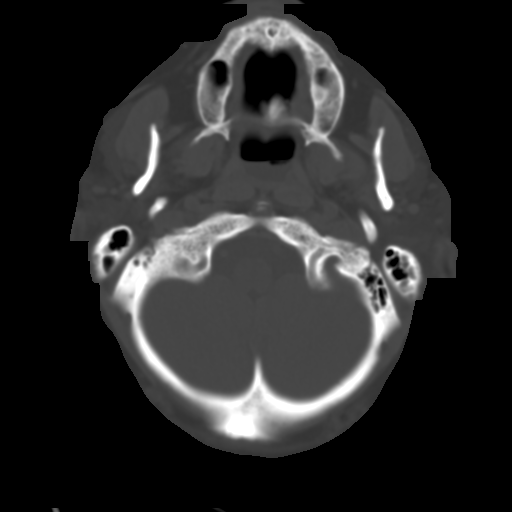
[im 9/36  brain]
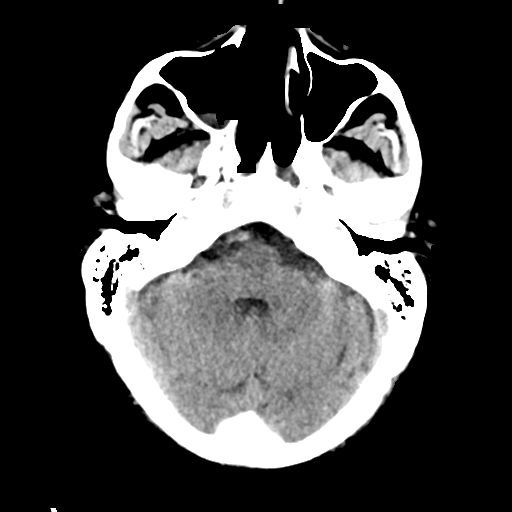
[im 14/36  brain]
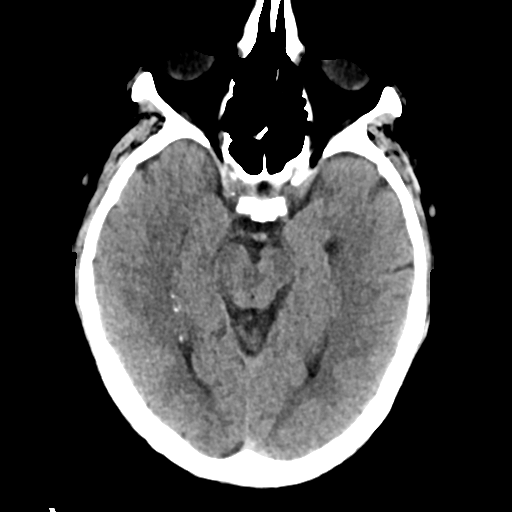
[im 18/36  brain]
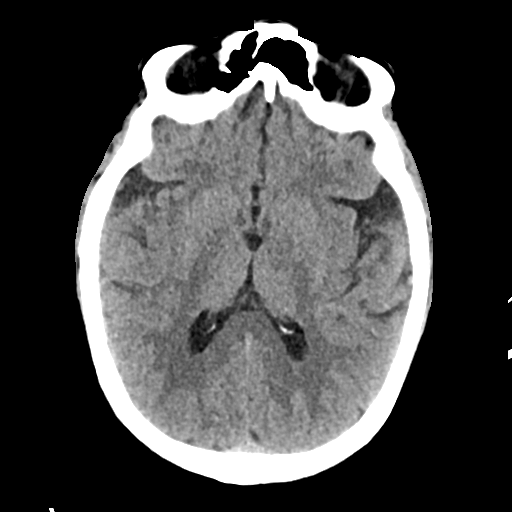
[im 22/36  brain]
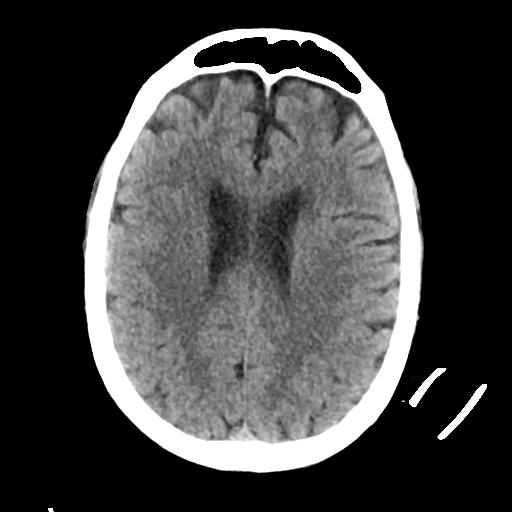
[im 22/36  bone]
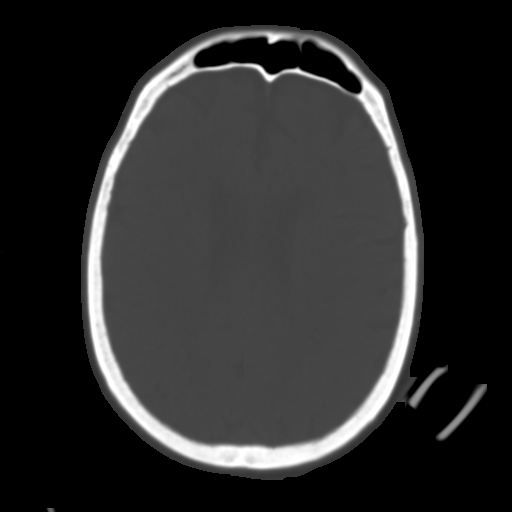
[im 27/36  brain]
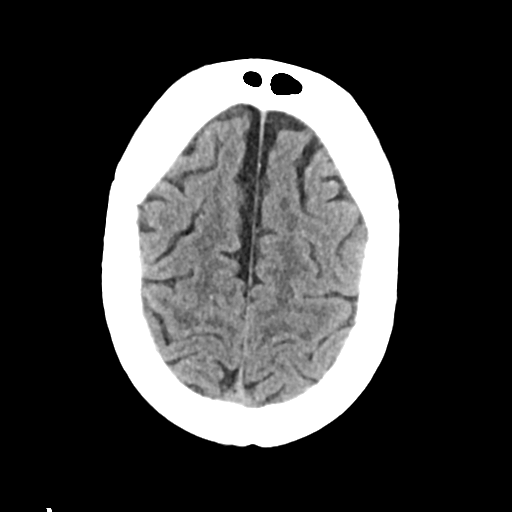
[im 31/36  brain]
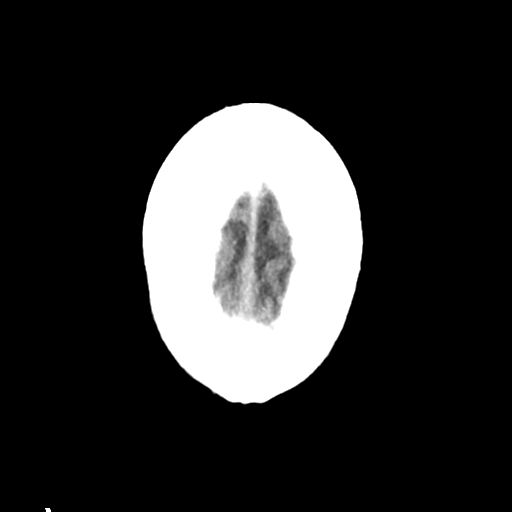

[Series 3: head bone · axial · 0.42mm/px · z∈[-134,-72]mm · 4 of 89 slices shown]
[im 9/89  bone]
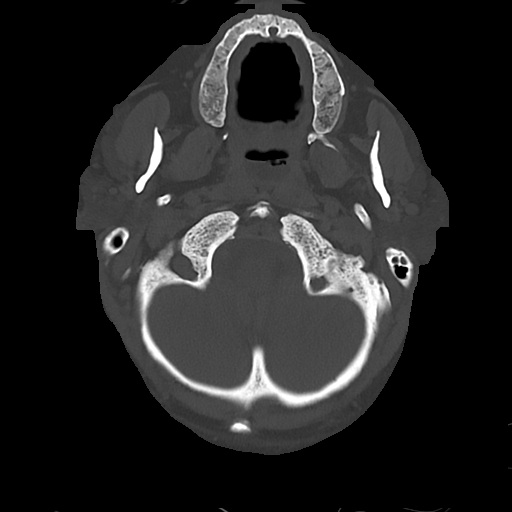
[im 18/89  bone]
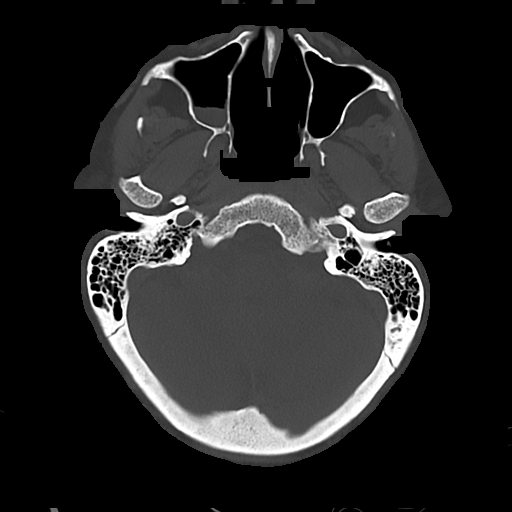
[im 27/89  bone]
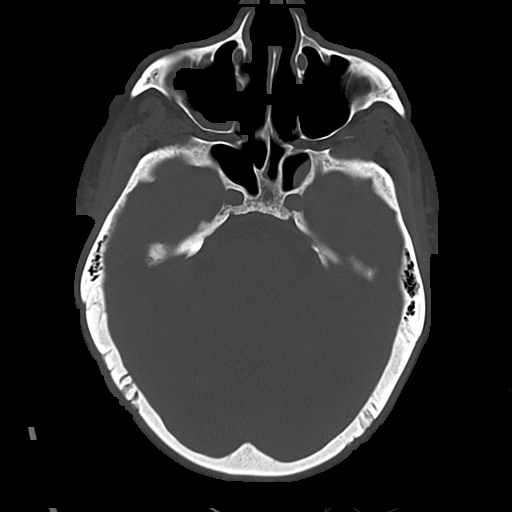
[im 40/89  bone]
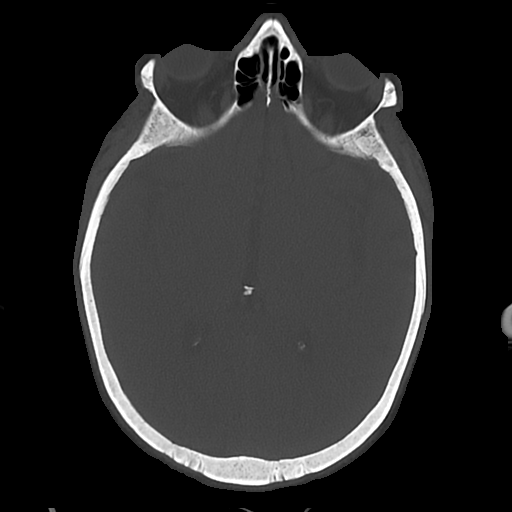

[Series 4: cor soft · coronal · 0.37mm/px · 3 of 75 slices shown]
[im 25/75  brain]
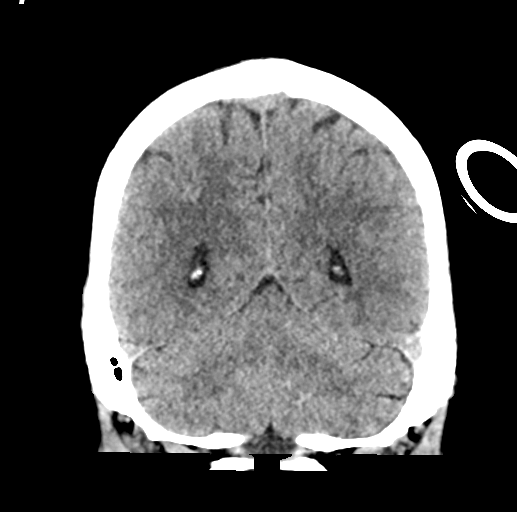
[im 33/75  brain]
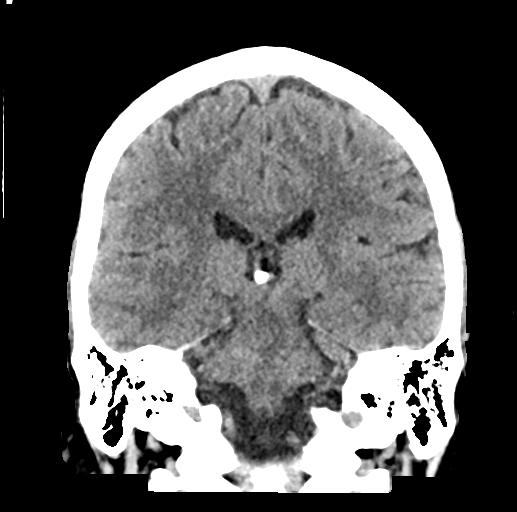
[im 42/75  brain]
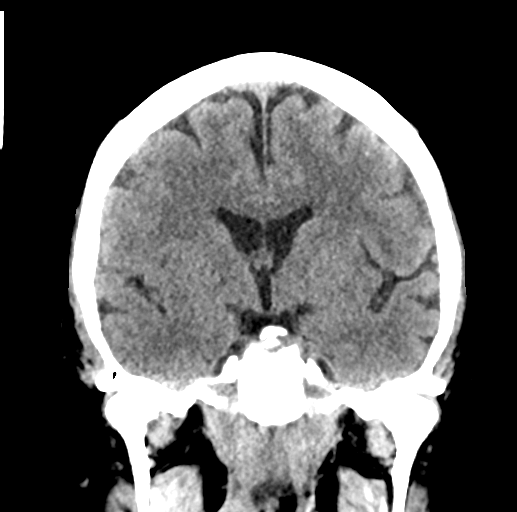

[Series 5: sag soft · sagittal · 0.37mm/px · 3 of 65 slices shown]
[im 22/65  brain]
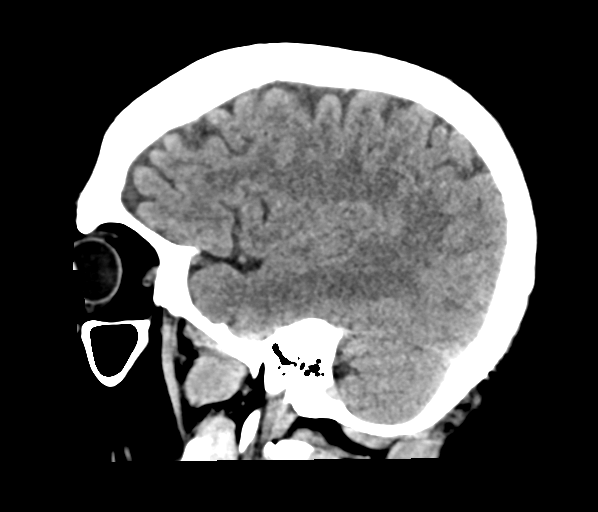
[im 33/65  brain]
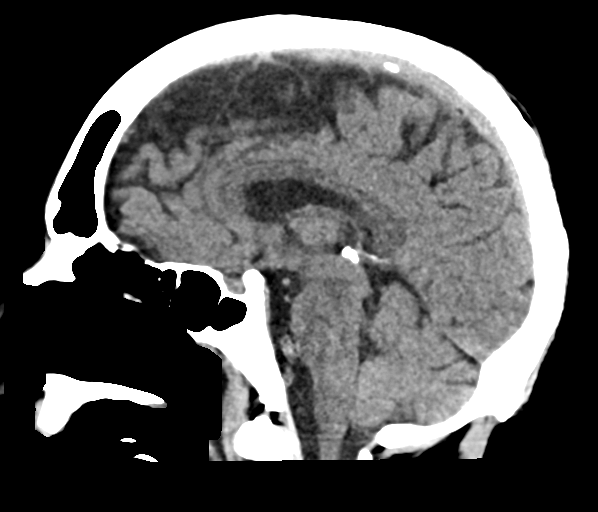
[im 43/65  brain]
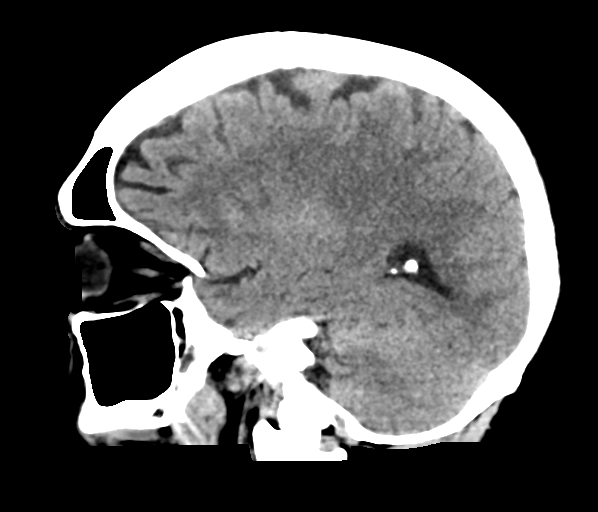

[17 of 47 positions shown; findings below may reference images not displayed]

FINDINGS: Brain: No evidence of acute infarction, hemorrhage, hydrocephalus,
extra-axial collection or mass lesion/mass effect.

Vascular: No hyperdense vessel or unexpected calcification.

Skull: Normal. Negative for fracture or focal lesion.

Sinuses/Orbits: Small amount of frothy secretions are noted
dependently in the right maxillary sinus.

Other: Endotracheal and orogastric tubes incompletely imaged.
IMPRESSION: 1. No acute intracranial abnormalities. The appearance of the brain
is normal for age.

## 2023-03-18 IMAGING — DX DG CHEST 1V PORT
1 series · 2 of 2 positions shown · non-contrast
Comparison: Chest x-ray 07/08/2021.

CLINICAL DATA: 61-year-old male status post intubation.

EXAM:
PORTABLE CHEST 1 VIEW

[Series 1: chest ap · 0.14mm/px · 2 of 2 slices shown]
[im 1/2]
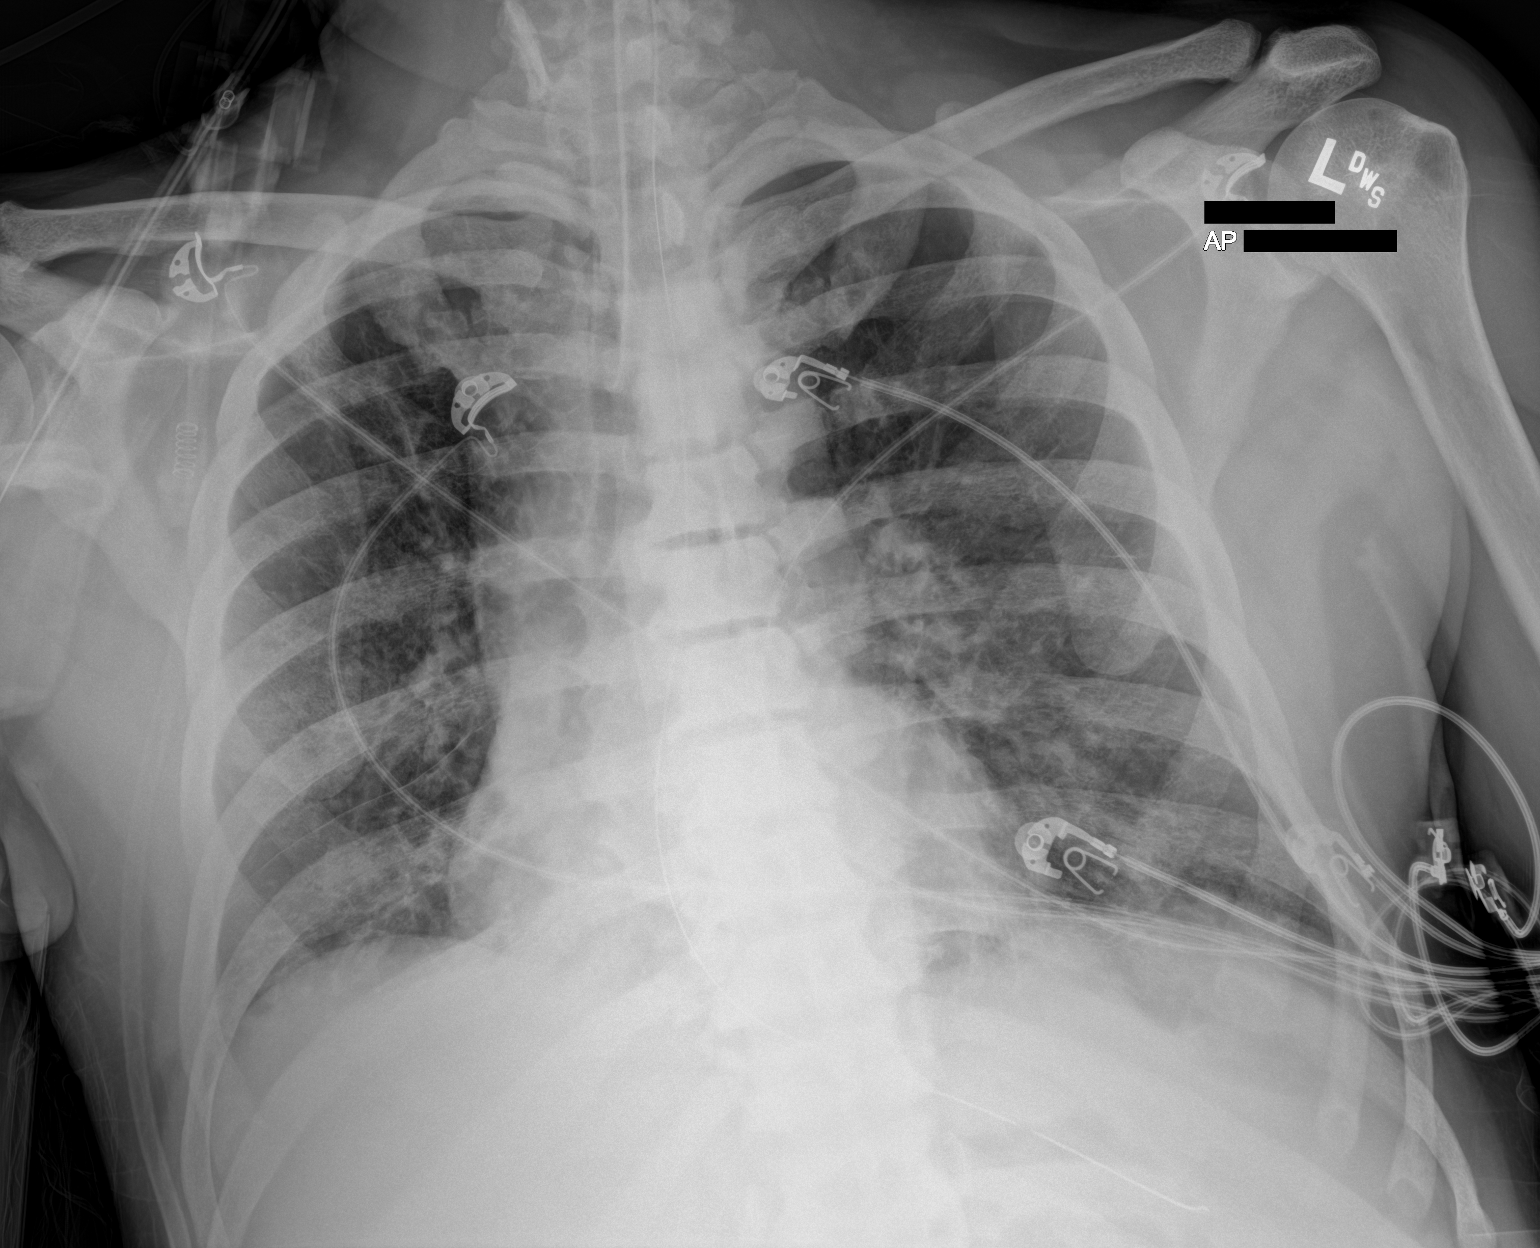
[im 2/2]
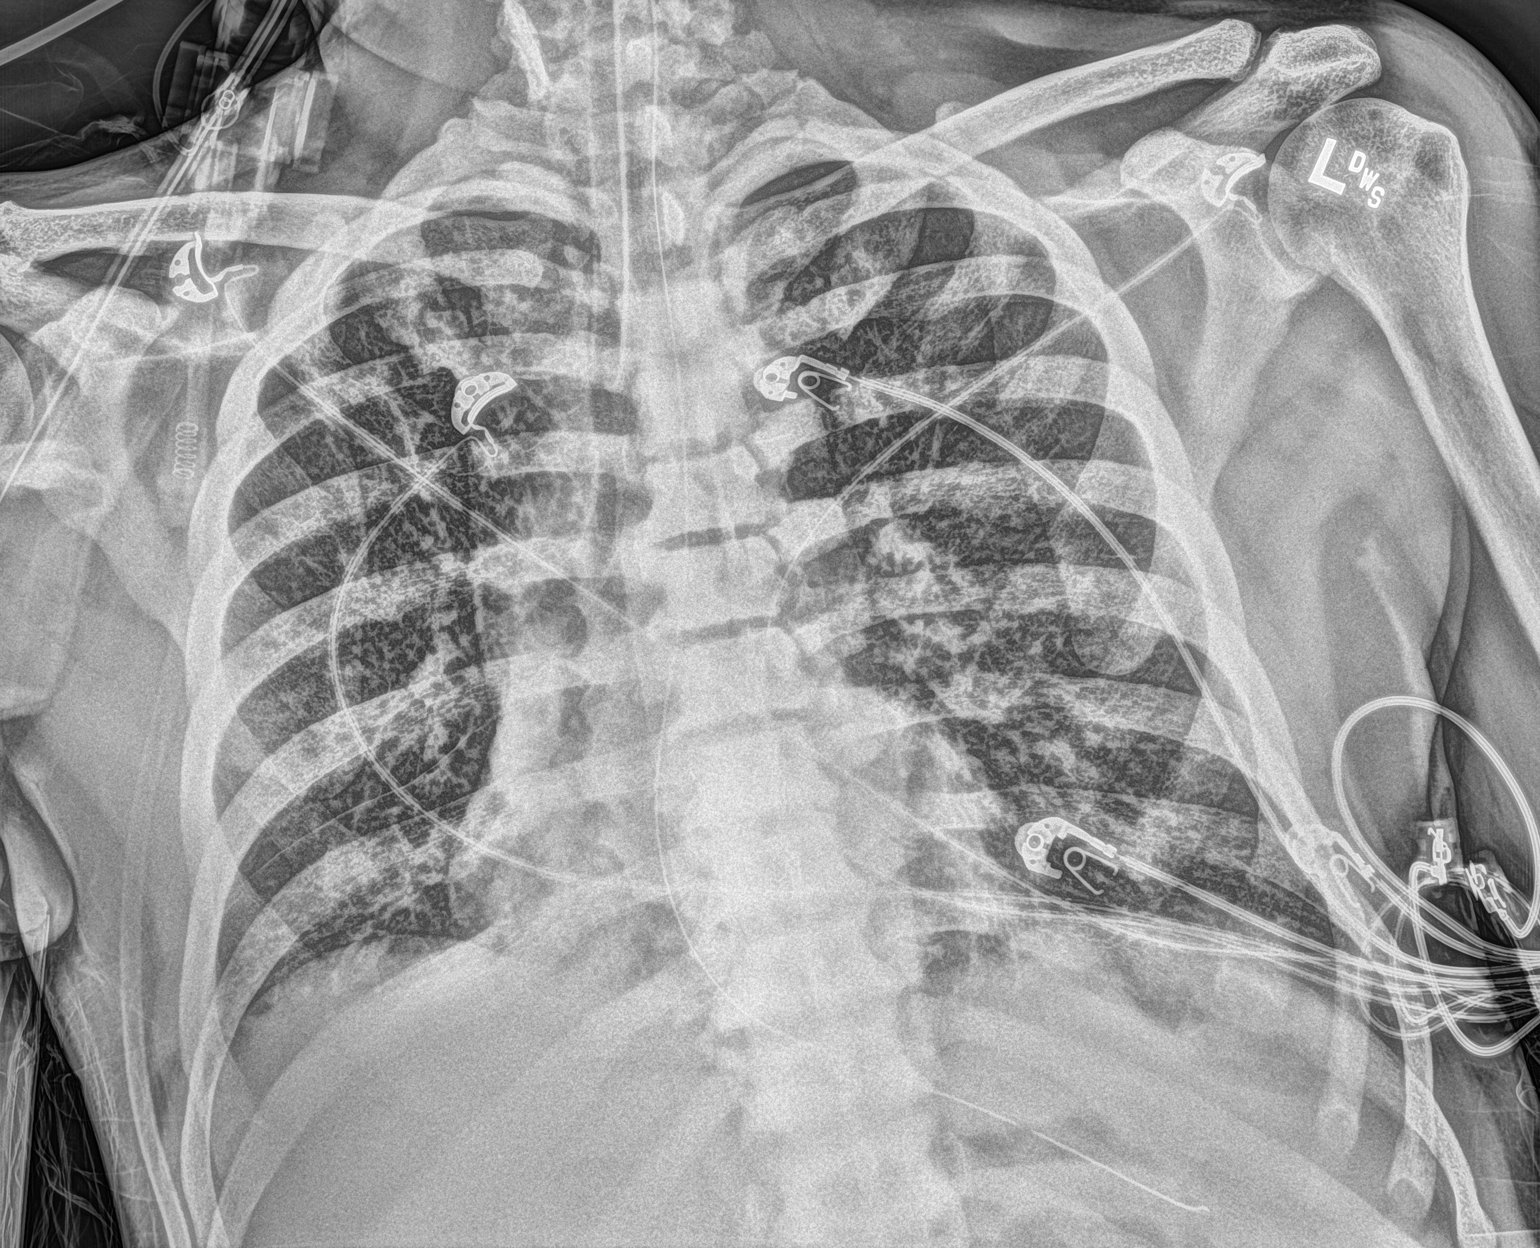

[2 of 2 positions shown; findings below may reference images not displayed]

FINDINGS: An endotracheal tube is in place with tip 3.7 cm above the carina.
Nasogastric tube extends into the mid body of the stomach. Lung
volumes are slightly low. No confluent consolidative airspace
disease. However, there is extensive interstitial prominence and
peribronchial cuffing most evident in the mid to lower lungs
bilaterally. No pleural effusions. No pneumothorax. No evidence of
pulmonary edema. Heart size is normal. Upper mediastinal contours
are within normal limits.
IMPRESSION: 1. Support apparatus, as above.
2. The appearance of the lungs suggests severe acute bronchitis.

## 2023-03-19 ENCOUNTER — Ambulatory Visit
Admission: RE | Admit: 2023-03-19 | Discharge: 2023-03-19 | Disposition: A | Discharge status: Home / Self Care | Payer: Medicaid Other | Source: Ambulatory Visit | Attending: Family Medicine | Admitting: Family Medicine

## 2023-03-19 ENCOUNTER — Other Ambulatory Visit: Payer: Self-pay | Admitting: Family Medicine

## 2023-03-19 DIAGNOSIS — M5412 Radiculopathy, cervical region: Secondary | ICD-10-CM | POA: Diagnosis not present

## 2023-03-19 DIAGNOSIS — Z981 Arthrodesis status: Secondary | ICD-10-CM | POA: Diagnosis not present

## 2023-03-19 DIAGNOSIS — M5416 Radiculopathy, lumbar region: Secondary | ICD-10-CM | POA: Insufficient documentation

## 2023-03-19 DIAGNOSIS — M4802 Spinal stenosis, cervical region: Secondary | ICD-10-CM | POA: Diagnosis not present

## 2023-03-19 DIAGNOSIS — R531 Weakness: Secondary | ICD-10-CM | POA: Diagnosis not present

## 2023-03-19 DIAGNOSIS — M5414 Radiculopathy, thoracic region: Secondary | ICD-10-CM

## 2023-03-19 DIAGNOSIS — R5383 Other fatigue: Secondary | ICD-10-CM | POA: Diagnosis not present

## 2023-03-19 DIAGNOSIS — G4733 Obstructive sleep apnea (adult) (pediatric): Secondary | ICD-10-CM | POA: Diagnosis not present

## 2023-03-19 DIAGNOSIS — M4724 Other spondylosis with radiculopathy, thoracic region: Secondary | ICD-10-CM | POA: Diagnosis not present

## 2023-03-19 DIAGNOSIS — R569 Unspecified convulsions: Secondary | ICD-10-CM | POA: Diagnosis not present

## 2023-03-19 DIAGNOSIS — R55 Syncope and collapse: Secondary | ICD-10-CM | POA: Diagnosis not present

## 2023-03-19 DIAGNOSIS — M4726 Other spondylosis with radiculopathy, lumbar region: Secondary | ICD-10-CM | POA: Diagnosis not present

## 2023-03-19 DIAGNOSIS — R4701 Aphasia: Secondary | ICD-10-CM | POA: Diagnosis not present

## 2023-03-19 DIAGNOSIS — G959 Disease of spinal cord, unspecified: Secondary | ICD-10-CM | POA: Diagnosis not present

## 2023-03-19 DIAGNOSIS — M545 Low back pain, unspecified: Secondary | ICD-10-CM | POA: Diagnosis not present

## 2023-03-19 DIAGNOSIS — R32 Unspecified urinary incontinence: Secondary | ICD-10-CM | POA: Diagnosis not present

## 2023-03-19 DIAGNOSIS — R42 Dizziness and giddiness: Secondary | ICD-10-CM | POA: Diagnosis not present

## 2023-03-19 DIAGNOSIS — R2689 Other abnormalities of gait and mobility: Secondary | ICD-10-CM | POA: Diagnosis not present

## 2023-03-21 IMAGING — US US ABDOMEN LIMITED
1 series · 15 of 25 positions shown · non-contrast
Comparison: None Available.

CLINICAL DATA: Abdominal pain

EXAM:
ULTRASOUND ABDOMEN LIMITED RIGHT UPPER QUADRANT

[Series 1: us abdomen limited ruq · 15 of 46 slices shown]
[im 1/46]
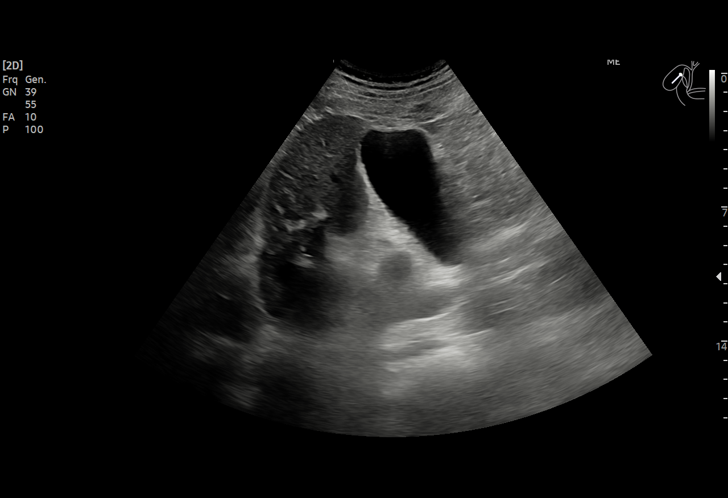
[im 4/46]
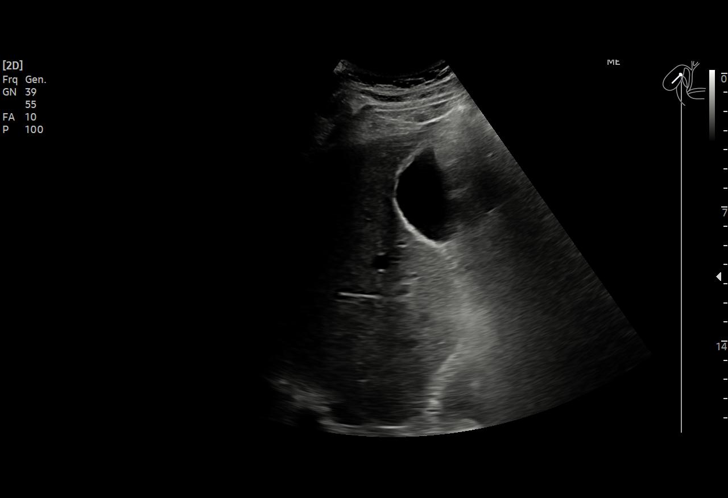
[im 8/46]
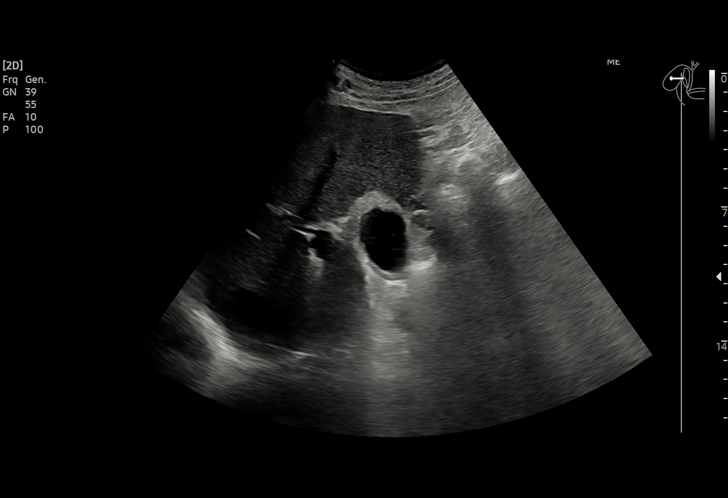
[im 10/46]
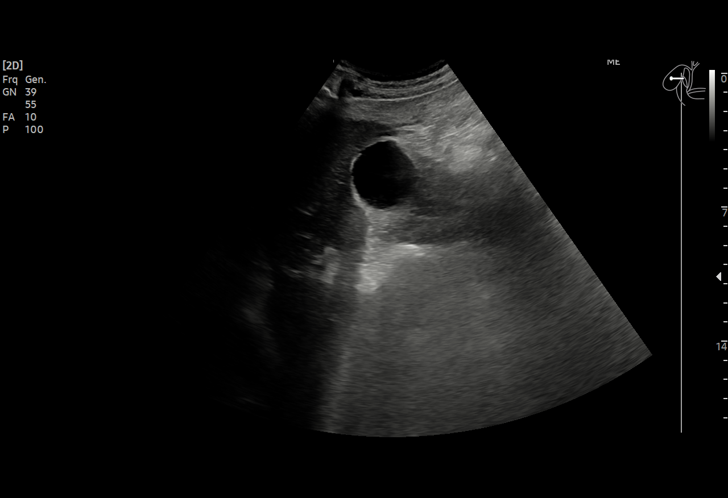
[im 14/46]
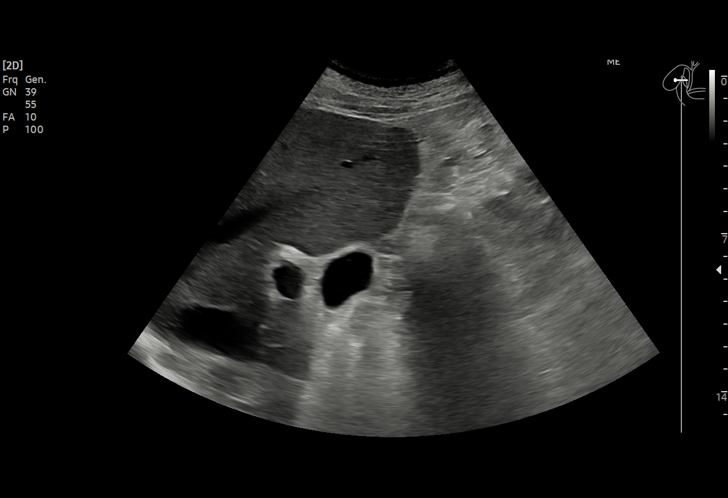
[im 17/46]
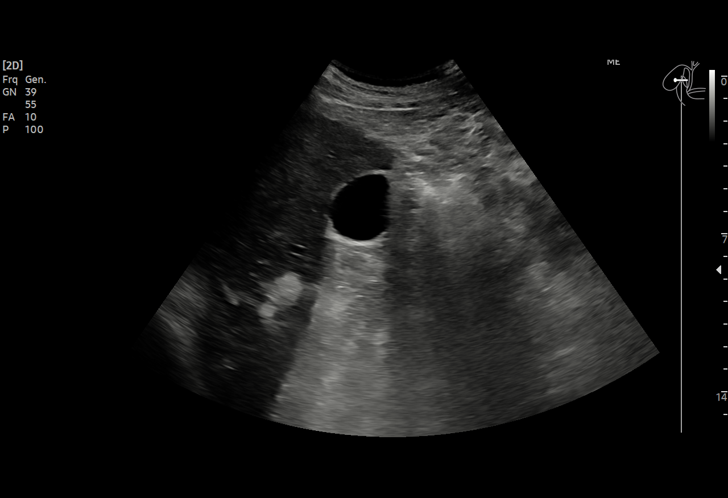
[im 19/46]
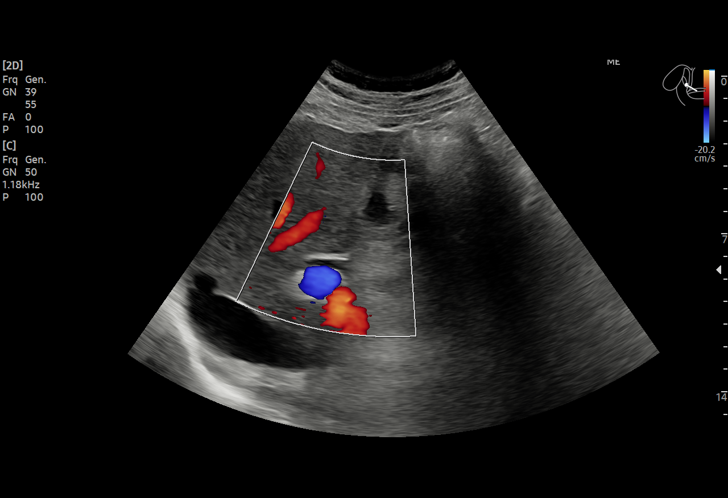
[im 23/46]
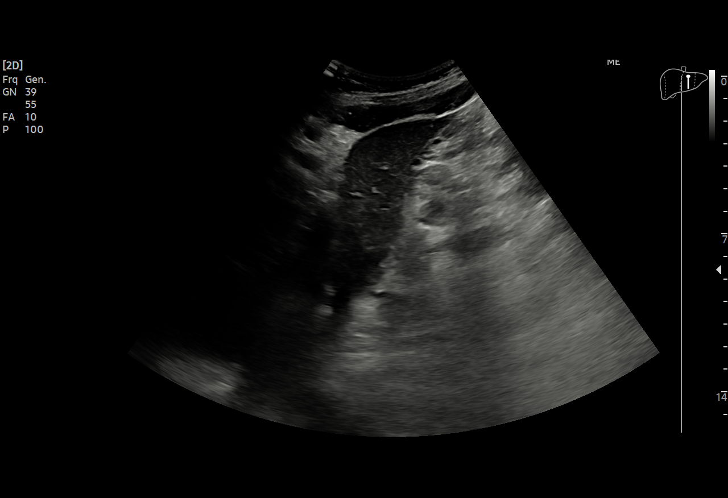
[im 27/46]
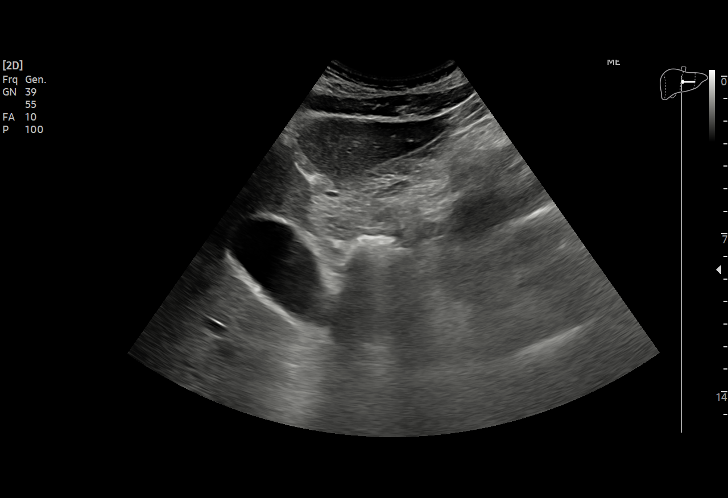
[im 29/46]
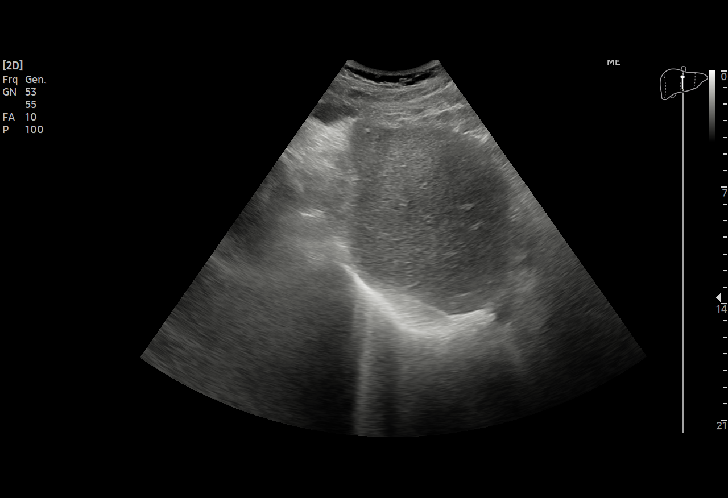
[im 32/46]
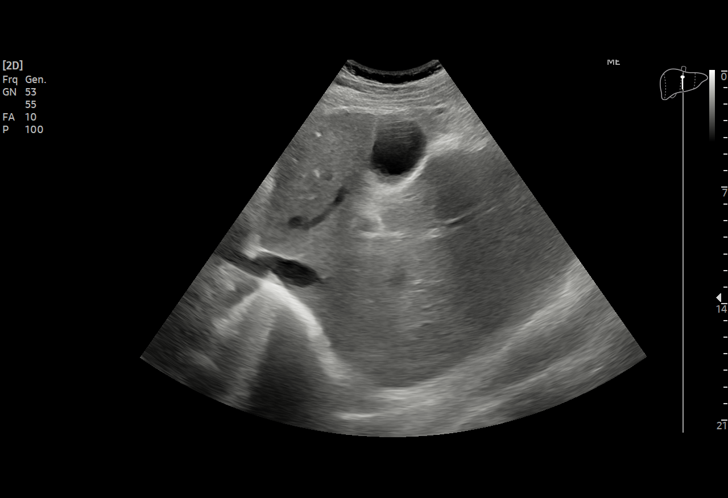
[im 36/46]
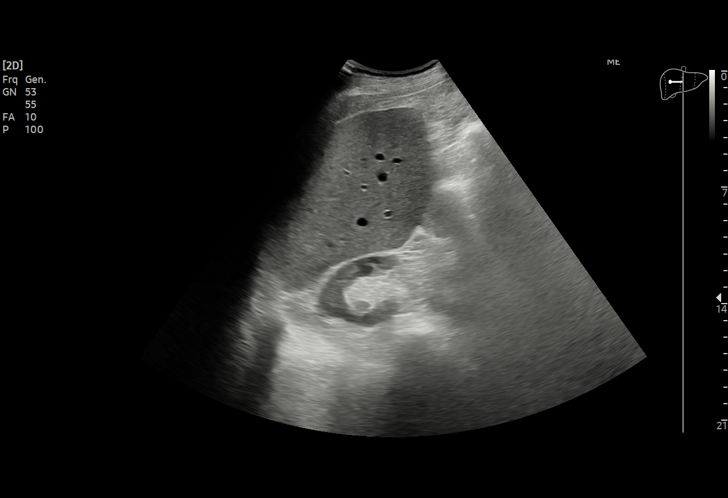
[im 38/46]
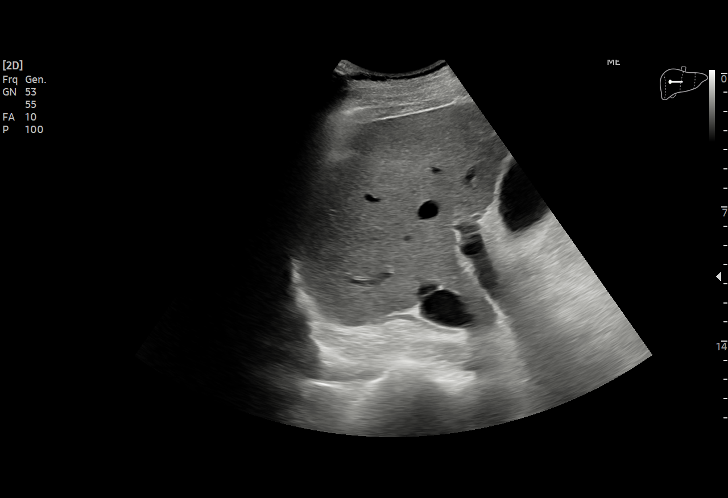
[im 42/46]
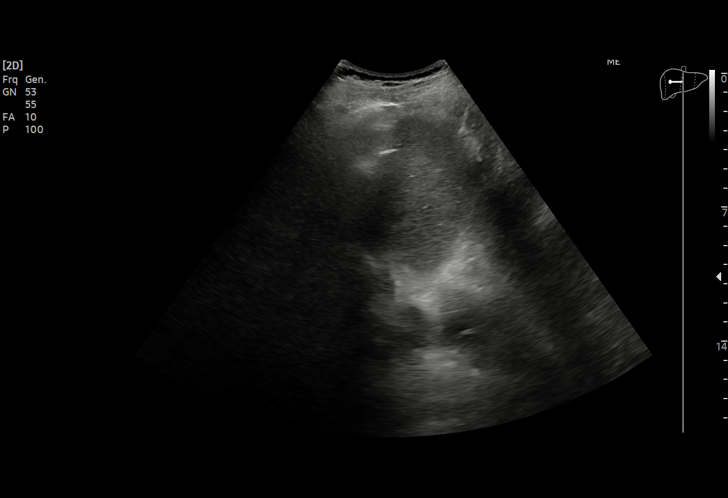
[im 46/46]
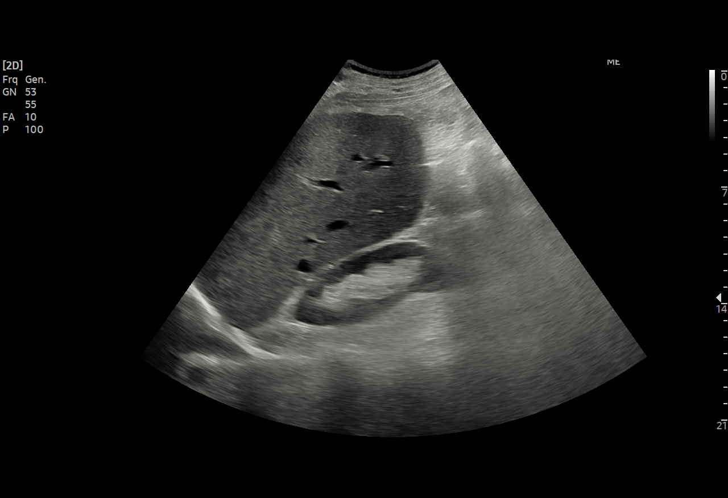

[15 of 25 positions shown; findings below may reference images not displayed]

FINDINGS: Gallbladder:

No gallstones or wall thickening visualized. No sonographic Murphy
sign noted by sonographer.

Common bile duct:

Diameter: 3.4 mm

Liver:

No focal lesion identified. Increased in parenchymal echogenicity.
Portal vein is patent on color Doppler imaging with normal direction
of blood flow towards the liver.

Other: None.
IMPRESSION: 1. No sonographic evidence of acute cholecystitis.
2. Hepatic steatosis.

## 2023-04-05 DIAGNOSIS — M4802 Spinal stenosis, cervical region: Secondary | ICD-10-CM | POA: Diagnosis not present

## 2023-04-05 DIAGNOSIS — M5412 Radiculopathy, cervical region: Secondary | ICD-10-CM | POA: Diagnosis not present

## 2023-04-17 DIAGNOSIS — J449 Chronic obstructive pulmonary disease, unspecified: Secondary | ICD-10-CM | POA: Diagnosis not present

## 2023-04-21 NOTE — Progress Notes (Signed)
 New patient visit  Patient: Henry Owens   DOB: 1961/02/15   62 y.o. Male  MRN: [id number] Visit Date: 05/03/2023  Today's healthcare provider: Jolynn Spencer, PA-C   Chief Complaint  Patient presents with   Establish Care   Subjective    Henry Owens is a 62 y.o. male who presents today as a new patient to establish care.   Discussed the use of AI scribe software for clinical note transcription with the patient, who gave verbal consent to proceed.  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 The patient's pain is severe, rated at a 7 out of 10, and is described as sharp and throbbing. The pain is present in both sides of the lower back and buttocks, radiating down to the right knee. The patient also reports tingling sensations in the right hand and elbow.  The patient's hypertension is managed with amlodipine  and atenolol . However, the patient expresses dissatisfaction with the current dose of Xanax  (0.25 mg twice daily) prescribed for anxiety and sleep issues. The patient also takes Effexor  for anxiety.  The patient has a history of stroke, which has resulted in some memory issues. The patient reports slight memory problems, which started after the stroke. The patient also has tinnitus in both ears, which has been evaluated by an ENT doctor and deemed age-related.  The patient is a current smoker and has COPD due to smoking. The patient uses Symbicort  and albuterol  for COPD management. The patient also reports regular diarrhea, occurring once a day, usually upon waking up.  The patient uses cannabis for pain management and reports no change in tolerance. The patient also takes vitamin D  supplements due to a previous deficiency diagnosis.       Past Medical History:  Diagnosis Date    Allergy 2020   Anxiety    Arthritis    Blood transfusion without reported diagnosis 11/2022   COPD (chronic obstructive pulmonary disease) (HCC) 2023   Depression    Emphysema of lung (HCC)    Flu 07/2017   GERD (gastroesophageal reflux disease) 2023   Hyperlipidemia    Hypertension    Intractable nausea and vomiting 12/11/2022   Nodule of right lung    Oxygen deficiency 2023   Only when needed   Sleep apnea 2024   Stroke (HCC) 11/2022   Tinnitus    Past Surgical History:  Procedure Laterality Date   APPENDECTOMY  1975   BACK SURGERY     KNEE ARTHROSCOPY WITH MEDIAL MENISECTOMY Right 08/20/2017   Procedure: KNEE ARTHROSCOPY WITH PARTIAL MEDIAL AND LATERAL MENISECTOMY,PARTIAL SYNOVECTOMY,CHONDROPLASTY, LOOSE BODY REMOVAL;  Surgeon: Leora Lynwood SAUNDERS, MD;  Location: [facility] ORS;  Service: Orthopedics;  Laterality: Right;   KNEE SURGERY  1975   1996, 2000   SPINE SURGERY  9 1 69   Last of multiple procedures,  limbar fusion   Family Status  Relation Name Status   Mother Gabriel (Not Specified)   Father Zell (Not Specified)   Son Penne (Not Specified)   Daughter Emmalene (Not Specified)   Sister Bucky (Not Specified)   Neg Hx  (Not Specified)  No partnership data on file   Family History  Problem Relation Age of Onset   Hypertension Mother    Stroke Mother    Cancer Mother  colon   Arthritis Mother    Depression Mother    Stroke Father    Arthritis Father    Hearing loss Father    Anxiety disorder Son    Anxiety disorder Daughter    Obesity Sister    Bladder Cancer Neg Hx    Prostate cancer Neg Hx    Hematuria Neg Hx    Social History   Socioeconomic History   Marital status: Married    Spouse name: Not on file   Number of children: Not on file   Years of education: Not on file   Highest education level: 11th grade  Occupational History   Not on file  Tobacco Use   Smoking status: Every Day    Current packs/day: 1.50    Average packs/day: 1.5 packs/day  for 25.0 years (37.5 ttl pk-yrs)    Types: Cigarettes   Smokeless tobacco: Never  Vaping Use   Vaping status: Never Used  Substance and Sexual Activity   Alcohol use: No   Drug use: Not Currently    Types: Marijuana   Sexual activity: Not Currently    Birth control/protection: Abstinence  Other Topics Concern   Not on file  Social History Narrative   Not on file   Social Drivers of Health   Financial Resource Strain: Medium Risk (04/26/2023)   Overall Financial Resource Strain (CARDIA)    Difficulty of Paying Living Expenses: Somewhat hard  Food Insecurity: No Food Insecurity (04/26/2023)   Hunger Vital Sign    Worried About Running Out of Food in the Last Year: Never true    Ran Out of Food in the Last Year: Never true  Transportation Needs: No Transportation Needs (04/26/2023)   PRAPARE - Administrator, Civil Service (Medical): No    Lack of Transportation (Non-Medical): No  Physical Activity: Unknown (04/26/2023)   Exercise Vital Sign    Days of Exercise per Week: 0 days    Minutes of Exercise per Session: Not on file  Stress: Stress Concern Present (04/26/2023)   [employer] of Occupational Health - Occupational Stress Questionnaire    Feeling of Stress : Rather much  Social Connections: Socially Isolated (04/26/2023)   Social Connection and Isolation Panel [NHANES]    Frequency of Communication with Friends and Family: Never    Frequency of Social Gatherings with Friends and Family: Never    Attends Religious Services: Never    Database Administrator or Organizations: No    Attends Engineer, Structural: Not on file    Marital Status: Married   Outpatient Medications Prior to Visit  Medication Sig   acetaminophen  (TYLENOL ) 500 MG tablet Take 1,000 mg by mouth in the morning, at noon, and at bedtime.   ALPRAZolam  (XANAX ) 0.25 MG tablet Take 0.25 mg by mouth 2 (two) times daily as needed for anxiety or sleep.   amLODipine  (NORVASC ) 5 MG tablet Take 5  mg by mouth daily.   aspirin  EC 81 MG tablet Take 81 mg by mouth daily. Swallow whole.   atenolol  (TENORMIN ) 50 MG tablet Take 25 mg by mouth daily.   atorvastatin  (LIPITOR) 10 MG tablet Take 10 mg by mouth every evening.   budesonide -formoterol  (SYMBICORT ) 80-4.5 MCG/ACT inhaler Inhale 2 puffs into the lungs in the morning and at bedtime.   celecoxib  (CELEBREX ) 100 MG capsule Take 100 mg by mouth 2 (two) times daily.   Cholecalciferol (D 1000) 25 MCG (1000 UT) capsule Take 1,000 Units by mouth  daily.   cyanocobalamin  1000 MCG tablet Take 1 tablet (1,000 mcg total) by mouth daily.   fluticasone  (FLONASE ) 50 MCG/ACT nasal spray Place 1 spray into both nostrils daily as needed for allergies or rhinitis.   gabapentin  (NEURONTIN ) 300 MG capsule Take 900 mg by mouth 3 (three) times daily.   Pancrelipase , Lip-Prot-Amyl, (CREON ) 24000-76000 units CPEP Take 1 capsule (24,000 Units total) by mouth 3 (three) times daily before meals.   pantoprazole  (PROTONIX ) 40 MG tablet Take 1 tablet (40 mg total) by mouth daily.   polyethylene glycol (MIRALAX  / GLYCOLAX ) 17 g packet Take 17 g by mouth daily as needed (constipation).   tiZANidine  (ZANAFLEX ) 4 MG tablet Take 4 mg by mouth 3 (three) times daily.   venlafaxine  XR (EFFEXOR -XR) 150 MG 24 hr capsule Take 150 mg by mouth daily with breakfast.   venlafaxine  XR (EFFEXOR -XR) 75 MG 24 hr capsule Take 75 mg by mouth daily.   [DISCONTINUED] gabapentin  (NEURONTIN ) 600 MG tablet Take 900 mg by mouth 2 (two) times daily.   [DISCONTINUED] HYDROcodone -acetaminophen  (NORCO/VICODIN) 5-325 MG tablet Take 1 tablet by mouth 2 (two) times daily as needed.   [DISCONTINUED] levETIRAcetam (KEPPRA) 500 MG tablet Take 500 mg by mouth 2 (two) times daily.   [DISCONTINUED] lisinopril  (ZESTRIL ) 10 MG tablet Take 10 mg by mouth daily.   No facility-administered medications prior to visit.   Allergies  Allergen Reactions   Wound Dressing Adhesive Other (See Comments)    BLISTER    Azithromycin  Rash   Penicillin G Rash    Has patient had a PCN reaction causing immediate rash, facial/tongue/throat swelling, SOB or lightheadedness with hypotension: Yes Has patient had a PCN reaction causing severe rash involving mucus membranes or skin necrosis: No Has patient had a PCN reaction that required hospitalization: No Has patient had a PCN reaction occurring within the last 10 years: No If all of the above answers are NO, then may proceed with Cephalosporin use.      There is no immunization history on file for this patient.  Health Maintenance  Topic Date Due   COVID-19 Vaccine (1) Never done   Pneumococcal Vaccine 26-87 Years old (1 of 2 - PCV) Never done   DTaP/Tdap/Td (1 - Tdap) Never done   Zoster Vaccines- Shingrix (1 of 2) Never done   Colonoscopy  Never done   INFLUENZA VACCINE  Never done   Lung Cancer Screening  11/26/2023   Hepatitis C Screening  Completed   HIV Screening  Completed   HPV VACCINES  Aged Out    Patient Care Team: Oddie Bottger, PA-C as PCP - General (Physician Assistant)  Review of Systems  All other systems reviewed and are negative.  Except see HPI       Objective    BP 123/81   Pulse 88   Resp 18   Ht 6' (1.829 m)   Wt 220 lb 14.4 oz (100.2 kg)   SpO2 98%   BMI 29.96 kg/m     Physical Exam Vitals reviewed.  Constitutional:      General: He is not in acute distress.    Appearance: Normal appearance. He is not diaphoretic.  HENT:     Head: Normocephalic and atraumatic.  Eyes:     General: No scleral icterus.    Conjunctiva/sclera: Conjunctivae normal.  Cardiovascular:     Rate and Rhythm: Normal rate and regular rhythm.     Pulses: Normal pulses.     Heart sounds: Normal heart sounds.  No murmur heard. Pulmonary:     Effort: Pulmonary effort is normal. No respiratory distress.     Breath sounds: Normal breath sounds. No wheezing or rhonchi.  Musculoskeletal:     Cervical back: Neck supple.     Right  lower leg: No edema.     Left lower leg: No edema.  Lymphadenopathy:     Cervical: No cervical adenopathy.  Skin:    General: Skin is warm and dry.     Findings: No rash.  Neurological:     Mental Status: He is alert and oriented to person, place, and time. Mental status is at baseline.  Psychiatric:        Mood and Affect: Mood normal.        Behavior: Behavior normal.    Depression Screen    05/03/2023    2:19 PM  PHQ 2/9 Scores  PHQ - 2 Score 4  PHQ- 9 Score 9   No results found for any visits on 05/03/23.  Assessment & Plan      Overweight Chronic, associated with HTN ordered - Lipid panel - Hemoglobin A1c - CBC with Differential/Platelet - Comprehensive metabolic panel - TSH Weight loss of 5% of pt's current weight via healthy diet and daily exercise encouraged. Will follow-up  Vitamin D  deficiency In the past - VITAMIN D  25 Hydroxy (Vit-D Deficiency, Fractures)  Hypertension Chronic  BP today WNL Controlled with Amlodipine  5mg  and Atenolol  25mg . -Continue current medications. Encouraged lifestyle modifications  Anxiety Chronic Managed with Effexor  and Xanax  0.25mg  twice daily. Discussed potential risks of long-term benzodiazepine use, including potential for early Alzheimer's development. -Consider consultation with behavioral health specialist for potential medication adjustments. Depression chronic Possible secondary to chronic pain and fatigue. -Consider consultation with behavioral health specialist for potential medication adjustments.  Arthritis chronic Pain managed with Tylenol  and Gabapentin  300. -Continue current medications. -Consider physical therapy if covered by insurance.  Chronic Obstructive Pulmonary Disease (COPD) Due to smoking, managed with Symbicort  and Albuterol . -Continue current medications. -Recommend follow-up with pulmonologist for ongoing management and monitoring of lung nodules.  Hyperlipidemia Chronic Continue  taking atorvastatin  10mg , low cholesterol diet and daily exercise No specific plan discussed in the conversation.  Intractable Nausea and Vomiting Due to daily use of cannabinoids Occurs approximately once every two months. -No specific plan discussed in the conversation.  Tinnitus Bilateral, age-related, evaluated by ENT and deemed normal. -No specific plan discussed in the conversation.  Post-Stroke Sequelae Right-sided weakness and memory issues since stroke. Tingling sensation in right hand and pain radiating to right knee from lower back. -Consider neurology consultation for further evaluation and management. -Consider physical therapy if covered by insurance.  Urinary Urgency and Incontinence ~LUTS No pain or burning with urination. -Recommend urology consultation for further evaluation and management.  General Health Maintenance -Continue Pneumococcal vaccination. -Scheduled colonoscopy at the end of the month. -Consider physical therapy if covered by insurance. -Check Vitamin D  levels and adjust supplementation as needed. -Follow-up in 4-6 weeks.  Lung Nodules Identified in the right lung. -Recommend follow-up with pulmonologist for ongoing monitoring. Chronic Kidney Disease No specific plan discussed in the conversation.    Encounter to establish care Welcomed to our clinic Reviewed past medical hx, social hx, family hx and surgical hx Pt advised to send all vaccination records or screening   Return in about 4 weeks (around 05/31/2023) for CPE.    The patient was advised to call back or seek an in-person evaluation if the symptoms worsen or  if the condition fails to improve as anticipated.  I discussed the assessment and treatment plan with the patient. The patient was provided an opportunity to ask questions and all were answered. The patient agreed with the plan and demonstrated an understanding of the instructions.  I, Jaydah Stahle, PA-C have reviewed all  documentation for this visit. The documentation on  05/03/2023   for the exam, diagnosis, procedures, and orders are all accurate and complete.  Jolynn Spencer, [facility], MMS [facility] [phone] (phone) [phone] (fax)  [facility]

## 2023-04-27 DIAGNOSIS — M5412 Radiculopathy, cervical region: Secondary | ICD-10-CM | POA: Diagnosis not present

## 2023-04-27 DIAGNOSIS — M4802 Spinal stenosis, cervical region: Secondary | ICD-10-CM | POA: Diagnosis not present

## 2023-04-27 DIAGNOSIS — G959 Disease of spinal cord, unspecified: Secondary | ICD-10-CM | POA: Diagnosis not present

## 2023-04-27 DIAGNOSIS — Z79899 Other long term (current) drug therapy: Secondary | ICD-10-CM | POA: Diagnosis not present

## 2023-04-27 DIAGNOSIS — M5416 Radiculopathy, lumbar region: Secondary | ICD-10-CM | POA: Diagnosis not present

## 2023-05-03 ENCOUNTER — Encounter: Payer: Self-pay | Admitting: Physician Assistant

## 2023-05-03 ENCOUNTER — Ambulatory Visit: Payer: Medicaid Other | Admitting: Physician Assistant

## 2023-05-03 VITALS — BP 123/81 | HR 88 | Resp 18 | Ht 72.0 in | Wt 220.9 lb

## 2023-05-03 DIAGNOSIS — H9313 Tinnitus, bilateral: Secondary | ICD-10-CM | POA: Diagnosis not present

## 2023-05-03 DIAGNOSIS — I10 Essential (primary) hypertension: Secondary | ICD-10-CM | POA: Diagnosis not present

## 2023-05-03 DIAGNOSIS — E7849 Other hyperlipidemia: Secondary | ICD-10-CM

## 2023-05-03 DIAGNOSIS — Z8673 Personal history of transient ischemic attack (TIA), and cerebral infarction without residual deficits: Secondary | ICD-10-CM

## 2023-05-03 DIAGNOSIS — R112 Nausea with vomiting, unspecified: Secondary | ICD-10-CM

## 2023-05-03 DIAGNOSIS — M1990 Unspecified osteoarthritis, unspecified site: Secondary | ICD-10-CM | POA: Diagnosis not present

## 2023-05-03 DIAGNOSIS — J441 Chronic obstructive pulmonary disease with (acute) exacerbation: Secondary | ICD-10-CM

## 2023-05-03 DIAGNOSIS — F32A Depression, unspecified: Secondary | ICD-10-CM

## 2023-05-03 DIAGNOSIS — D696 Thrombocytopenia, unspecified: Secondary | ICD-10-CM

## 2023-05-03 DIAGNOSIS — Z7689 Persons encountering health services in other specified circumstances: Secondary | ICD-10-CM

## 2023-05-03 DIAGNOSIS — N1831 Chronic kidney disease, stage 3a: Secondary | ICD-10-CM

## 2023-05-03 DIAGNOSIS — F419 Anxiety disorder, unspecified: Secondary | ICD-10-CM

## 2023-05-03 DIAGNOSIS — E559 Vitamin D deficiency, unspecified: Secondary | ICD-10-CM

## 2023-05-03 DIAGNOSIS — R399 Unspecified symptoms and signs involving the genitourinary system: Secondary | ICD-10-CM | POA: Diagnosis not present

## 2023-05-03 DIAGNOSIS — E663 Overweight: Secondary | ICD-10-CM

## 2023-05-04 DIAGNOSIS — R55 Syncope and collapse: Secondary | ICD-10-CM | POA: Diagnosis not present

## 2023-05-07 ENCOUNTER — Ambulatory Visit (INDEPENDENT_AMBULATORY_CARE_PROVIDER_SITE_OTHER): Payer: Medicaid Other | Admitting: Urology

## 2023-05-07 VITALS — BP 169/92 | HR 83 | Ht 70.0 in | Wt 199.2 lb

## 2023-05-07 DIAGNOSIS — N3946 Mixed incontinence: Secondary | ICD-10-CM

## 2023-05-07 LAB — URINALYSIS, COMPLETE
Bilirubin, UA: NEGATIVE
Glucose, UA: NEGATIVE
Ketones, UA: NEGATIVE
Leukocytes,UA: NEGATIVE
Nitrite, UA: NEGATIVE
Protein,UA: NEGATIVE
RBC, UA: NEGATIVE
Specific Gravity, UA: 1.02 (ref 1.005–1.030)
Urobilinogen, Ur: 0.2 mg/dL (ref 0.2–1.0)
pH, UA: 5.5 (ref 5.0–7.5)

## 2023-05-07 LAB — MICROSCOPIC EXAMINATION: Bacteria, UA: NONE SEEN

## 2023-05-07 LAB — BLADDER SCAN AMB NON-IMAGING

## 2023-05-07 MED ORDER — SILDENAFIL CITRATE 20 MG PO TABS: ORAL_TABLET | ORAL | 0 refills | Status: DC
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
## 2023-05-08 DIAGNOSIS — Z8673 Personal history of transient ischemic attack (TIA), and cerebral infarction without residual deficits: Secondary | ICD-10-CM | POA: Diagnosis not present

## 2023-05-08 DIAGNOSIS — R404 Transient alteration of awareness: Secondary | ICD-10-CM | POA: Diagnosis not present

## 2023-05-08 DIAGNOSIS — I10 Essential (primary) hypertension: Secondary | ICD-10-CM | POA: Diagnosis not present

## 2023-05-08 LAB — PSA: Prostate Specific Ag, Serum: 0.6 ng/mL (ref 0.0–4.0)

## 2023-05-08 NOTE — Progress Notes (Signed)
 Established Patient Visit   Chief Complaint: Follow-up episodes of unresponsiveness, after evaluation Chief Complaint  Patient presents with  . Follow-up    Holter    Date of Service: 05/08/2023 Date of Birth: 1960-11-26 PCP: Oswalt, Janna, PA  History of Present Illness: [name] is a 63 y.o.male patient who presented for follow-up of episodes of unresponsiveness, after evaluation.  No prior cardiac history. Has history of hypertension, history of lacunar infarcts on MRI brain, back pain/balance issues. Echo 11/2022 with normal biventricular systolic function with mild to moderate tricuspid regurgitation.  30-day cardiac monitor 02/2023 which showed sinus rhythm. No significant arrhythmias.  Today patient is accompanied by his wife to visit.  At baseline he walks with cane because of back pain/balance issues.  No recurrent episodes of unresponsiveness.  No complaints of chest pain or shortness of breath.  No palpitations or dizziness or syncope.  Past Medical and Surgical History  Past Medical History Past Medical History:  Diagnosis Date  . Allergy   . Anxiety   . Chickenpox   . COPD (chronic obstructive pulmonary disease) (CMS/HHS-HCC) 07/07/2021  . Current smoker 04/01/2021  . Depression   . GERD (gastroesophageal reflux disease) 07/07/2021  . Hearing difficulty of both ears 04/01/2021  . Hemorrhoids   . Hypertension 101 2021  . Kidney stones   . Osteoarthritis   . Pneumonia 07/05/2021   double pneumonia- cardio-pulmonary arrest    Past Surgical History He has a past surgical history that includes Appendectomy; Knee arthroscopy (Left); spinal surgery; right knee surgery (1971, 2019); and Bronchoscopy (07/09/2021).   Medications and Allergies  Current Medications  Current Outpatient Medications  Medication Sig Dispense Refill  . ALPRAZolam  (XANAX ) 0.5 MG tablet TAKE 1 TABLET BY MOUTH UP TO TWICE DAILY AS NEEDED 60 tablet 2  . aspirin  81 MG EC tablet Take 81 mg by mouth once  daily    . atenoloL  (TENORMIN ) 50 MG tablet TAKE 1 TABLET(50 MG) BY MOUTH DAILY (Patient taking differently: Take 25 mg by mouth once daily) 90 tablet 1  . atorvastatin  (LIPITOR) 10 MG tablet Take 1 tablet (10 mg total) by mouth once daily 90 tablet 1  . celecoxib  (CELEBREX ) 200 MG capsule Take 1 capsule (200 mg total) by mouth 2 (two) times daily 180 capsule 1  . cyanocobalamin  (VITAMIN B12) 1000 MCG tablet Take 1,000 mcg by mouth once daily    . gabapentin  (NEURONTIN ) 300 MG capsule Take 3 capsules (900 mg total) by mouth 3 (three) times daily 810 capsule 1  . HYDROcodone -acetaminophen  (NORCO) 7.5-325 mg tablet Take 1 tablet by mouth 2 (two) times daily as needed 14 tablet 0  . levETIRAcetam (KEPPRA) 500 MG tablet Take 500 mg once daily for 1 week then increase to 500 mg twice daily 60 tablet 2  . lisinopriL  (ZESTRIL ) 20 MG tablet Take 20 mg by mouth once daily    . mometasone -formoterol  (DULERA ) 100-5 mcg/actuation inhaler INHALE 2 PUFFS INTO THE LUNGS TWICE DAILY 13 g 0  . naltrexone 4.5 mg Cap Take 4.5 mg by mouth once daily    . pancrelipase  (CREON ) 24,000-76,000-120,000 unit DR capsule Take 1 capsule by mouth 3 (three) times daily with meals 270 capsule 1  . pantoprazole  (PROTONIX ) 40 MG DR tablet Take 1 tablet (40 mg total) by mouth once daily 90 tablet 0  . sodium, potassium, and magnesium  (SUPREP) oral solution Take 1 Bottle by mouth as directed One kit contains 2 bottles.  Take both bottles at the times instructed by  your provider. 354 mL 0  . tiZANidine  (ZANAFLEX ) 4 MG capsule Take 1 capsule (4 mg total) by mouth 2 (two) times daily 60 capsule 1  . tiZANidine  (ZANAFLEX ) 4 MG tablet Take 1 tablet (4 mg total) by mouth 2 (two) times daily as needed (pain) 60 tablet 1  . venlafaxine  (EFFEXOR -XR) 150 MG XR capsule TAKE 1 CAPSULE(150 MG) BY MOUTH DAILY 30 capsule 0  . venlafaxine  (EFFEXOR -XR) 75 MG XR capsule Take 1 capsule (75 mg total) by mouth once daily 90 capsule 1  . albuterol   (PROVENTIL ) 2.5 mg /3 mL (0.083 %) nebulizer solution Take 3 mLs (2.5 mg total) by nebulization every 6 (six) hours as needed for Wheezing 75 mL 12  . cholecalciferol (VITAMIN D3) 5,000 unit capsule Take 1 capsule (5,000 Units total) by mouth once daily 30 capsule 11   No current facility-administered medications for this visit.    Allergies: Adhesive, Penicillin, and Zithromax  [azithromycin ]  Social and Family History  Social History  reports that he has been smoking cigarettes. He started smoking about 38 years ago. He has a 73.9 pack-year smoking history. He has never used smokeless tobacco. He reports that he does not drink alcohol and does not use drugs.  Family History Family History  Problem Relation Name Age of Onset  . Breast cancer Mother Gabriel. Crawford   . Hyperlipidemia (Elevated cholesterol) Mother Gabriel. Crawford   . High blood pressure (Hypertension) Mother Gabriel. Crawford   . Stroke Mother Gabriel. Crawford   . Myocardial Infarction (Heart attack) Mother Gabriel. Crawford   . Cancer Mother Gabriel. Crawford   . Stroke Father    . Dementia Father    . Myocardial Infarction (Heart attack) Maternal Grandfather      Review of Systems   Review of Systems: The patient denies chest pain, shortness of breath, orthopnea, paroxysmal nocturnal dyspnea, pedal edema, palpitations, heart racing, presyncope, syncope.   Physical Examination   Vitals:BP 134/80   Pulse 89   Ht 177.8 cm (5' 10)   Wt 92.5 kg (204 lb)   SpO2 98%   BMI 29.27 kg/m  Ht:177.8 cm (5' 10) Wt:92.5 kg (204 lb) ADJ:Anib surface area is 2.14 meters squared. Body mass index is 29.27 kg/m.  HEENT: Pupils equally reactive to light and accomodation  Neck: Supple, no significant JVD Lungs: clear to auscultation bilaterally; no wheezes, rales, rhonchi Heart: Regular rate and rhythm. No murmur Extremities: no pedal edema  Assessment and Plan   63 y.o. male with  1. Recurrent episodes of unresponsiveness    2. Essential hypertension   3. History of stroke    Evaluation with echo which showed normal biventricular systolic function, mild to moderate TR.  30-day cardiac monitor which showed sinus rhythm and no significant arrhythmias.  No further cardiac evaluation is indicated at this time.  His episodes of unresponsiveness without loss of consciousness, unlikely cardiac etiology. Continue current medical therapy.  No orders of the defined types were placed in this encounter.   Return if symptoms worsen or fail to improve.  KRISHNA CHAITANYA ALLURI, MD  This dictation was prepared with dragon dictation. Any transcription errors that result from this process are unintentional.

## 2023-05-09 DIAGNOSIS — R2689 Other abnormalities of gait and mobility: Secondary | ICD-10-CM | POA: Diagnosis not present

## 2023-05-09 DIAGNOSIS — R42 Dizziness and giddiness: Secondary | ICD-10-CM | POA: Diagnosis not present

## 2023-05-09 DIAGNOSIS — Z8673 Personal history of transient ischemic attack (TIA), and cerebral infarction without residual deficits: Secondary | ICD-10-CM | POA: Diagnosis not present

## 2023-05-09 DIAGNOSIS — R5383 Other fatigue: Secondary | ICD-10-CM | POA: Diagnosis not present

## 2023-05-09 DIAGNOSIS — R569 Unspecified convulsions: Secondary | ICD-10-CM | POA: Diagnosis not present

## 2023-05-09 DIAGNOSIS — M542 Cervicalgia: Secondary | ICD-10-CM | POA: Diagnosis not present

## 2023-05-09 DIAGNOSIS — R262 Difficulty in walking, not elsewhere classified: Secondary | ICD-10-CM | POA: Diagnosis not present

## 2023-05-09 DIAGNOSIS — M545 Low back pain, unspecified: Secondary | ICD-10-CM | POA: Diagnosis not present

## 2023-05-09 DIAGNOSIS — G479 Sleep disorder, unspecified: Secondary | ICD-10-CM | POA: Diagnosis not present

## 2023-05-09 DIAGNOSIS — G8929 Other chronic pain: Secondary | ICD-10-CM | POA: Diagnosis not present

## 2023-05-09 DIAGNOSIS — R531 Weakness: Secondary | ICD-10-CM | POA: Diagnosis not present

## 2023-05-09 DIAGNOSIS — R55 Syncope and collapse: Secondary | ICD-10-CM | POA: Diagnosis not present

## 2023-05-09 LAB — CULTURE, URINE COMPREHENSIVE

## 2023-05-10 ENCOUNTER — Encounter: Payer: Self-pay | Admitting: *Deleted

## 2023-05-10 DIAGNOSIS — M5416 Radiculopathy, lumbar region: Secondary | ICD-10-CM | POA: Diagnosis not present

## 2023-05-10 DIAGNOSIS — M48062 Spinal stenosis, lumbar region with neurogenic claudication: Secondary | ICD-10-CM | POA: Diagnosis not present

## 2023-05-14 ENCOUNTER — Ambulatory Visit: Payer: Self-pay | Admitting: Urology

## 2023-05-18 ENCOUNTER — Ambulatory Visit: Payer: Medicaid Other | Admitting: Anesthesiology

## 2023-05-18 ENCOUNTER — Ambulatory Visit
Admission: RE | Admit: 2023-05-18 | Discharge: 2023-05-18 | Disposition: A | Discharge status: Home / Self Care | Payer: Medicaid Other | Attending: Gastroenterology | Admitting: Gastroenterology

## 2023-05-18 ENCOUNTER — Encounter: Payer: Self-pay | Admitting: *Deleted

## 2023-05-18 ENCOUNTER — Encounter
Admission: RE | Disposition: A | Discharge status: Home / Self Care | Payer: Self-pay | Source: Home / Self Care | Attending: Gastroenterology

## 2023-05-18 DIAGNOSIS — G4733 Obstructive sleep apnea (adult) (pediatric): Secondary | ICD-10-CM | POA: Insufficient documentation

## 2023-05-18 DIAGNOSIS — Z1211 Encounter for screening for malignant neoplasm of colon: Secondary | ICD-10-CM | POA: Insufficient documentation

## 2023-05-18 DIAGNOSIS — K219 Gastro-esophageal reflux disease without esophagitis: Secondary | ICD-10-CM | POA: Diagnosis not present

## 2023-05-18 DIAGNOSIS — J439 Emphysema, unspecified: Secondary | ICD-10-CM | POA: Insufficient documentation

## 2023-05-18 DIAGNOSIS — K635 Polyp of colon: Secondary | ICD-10-CM | POA: Diagnosis not present

## 2023-05-18 DIAGNOSIS — D120 Benign neoplasm of cecum: Secondary | ICD-10-CM | POA: Insufficient documentation

## 2023-05-18 DIAGNOSIS — K449 Diaphragmatic hernia without obstruction or gangrene: Secondary | ICD-10-CM | POA: Insufficient documentation

## 2023-05-18 DIAGNOSIS — K640 First degree hemorrhoids: Secondary | ICD-10-CM | POA: Diagnosis not present

## 2023-05-18 DIAGNOSIS — Z800 Family history of malignant neoplasm of digestive organs: Secondary | ICD-10-CM | POA: Insufficient documentation

## 2023-05-18 DIAGNOSIS — I10 Essential (primary) hypertension: Secondary | ICD-10-CM | POA: Insufficient documentation

## 2023-05-18 DIAGNOSIS — I6930 Unspecified sequelae of cerebral infarction: Secondary | ICD-10-CM | POA: Diagnosis not present

## 2023-05-18 DIAGNOSIS — J449 Chronic obstructive pulmonary disease, unspecified: Secondary | ICD-10-CM | POA: Insufficient documentation

## 2023-05-18 DIAGNOSIS — F17200 Nicotine dependence, unspecified, uncomplicated: Secondary | ICD-10-CM | POA: Insufficient documentation

## 2023-05-18 DIAGNOSIS — F17210 Nicotine dependence, cigarettes, uncomplicated: Secondary | ICD-10-CM | POA: Diagnosis not present

## 2023-05-18 DIAGNOSIS — D175 Benign lipomatous neoplasm of intra-abdominal organs: Secondary | ICD-10-CM | POA: Diagnosis not present

## 2023-05-18 DIAGNOSIS — Z6828 Body mass index (BMI) 28.0-28.9, adult: Secondary | ICD-10-CM | POA: Insufficient documentation

## 2023-05-18 DIAGNOSIS — I129 Hypertensive chronic kidney disease with stage 1 through stage 4 chronic kidney disease, or unspecified chronic kidney disease: Secondary | ICD-10-CM | POA: Diagnosis not present

## 2023-05-18 DIAGNOSIS — K649 Unspecified hemorrhoids: Secondary | ICD-10-CM | POA: Diagnosis not present

## 2023-05-18 DIAGNOSIS — K222 Esophageal obstruction: Secondary | ICD-10-CM | POA: Insufficient documentation

## 2023-05-18 DIAGNOSIS — E66813 Obesity, class 3: Secondary | ICD-10-CM | POA: Insufficient documentation

## 2023-05-18 DIAGNOSIS — Z13810 Encounter for screening for upper gastrointestinal disorder: Secondary | ICD-10-CM | POA: Diagnosis not present

## 2023-05-18 DIAGNOSIS — N1831 Chronic kidney disease, stage 3a: Secondary | ICD-10-CM | POA: Diagnosis not present

## 2023-05-18 HISTORY — PX: HEMOSTASIS CLIP PLACEMENT: SHX6857

## 2023-05-18 HISTORY — PX: COLONOSCOPY WITH PROPOFOL: SHX5780

## 2023-05-18 HISTORY — PX: ESOPHAGOGASTRODUODENOSCOPY (EGD) WITH PROPOFOL: SHX5813

## 2023-05-18 HISTORY — PX: SUBMUCOSAL LIFTING INJECTION: SHX6855

## 2023-05-18 HISTORY — PX: POLYPECTOMY: SHX5525

## 2023-05-18 SURGERY — COLONOSCOPY WITH PROPOFOL
Anesthesia: General

## 2023-05-18 MED ORDER — PHENYLEPHRINE HCL (PRESSORS) 10 MG/ML IV SOLN
INTRAVENOUS | Status: DC | PRN
  Administered 2023-05-18 (×2): 160 ug via INTRAVENOUS
  Administered 2023-05-18: 240 ug via INTRAVENOUS
  Administered 2023-05-18: 160 ug via INTRAVENOUS

## 2023-05-18 MED ORDER — ESMOLOL HCL 100 MG/10ML IV SOLN
INTRAVENOUS | Status: DC | PRN
  Administered 2023-05-18 (×2): 20 mg via INTRAVENOUS

## 2023-05-18 MED ORDER — PROPOFOL 10 MG/ML IV BOLUS
INTRAVENOUS | Status: DC | PRN
  Administered 2023-05-18 (×2): 50 mg via INTRAVENOUS

## 2023-05-18 MED ORDER — LIDOCAINE HCL (CARDIAC) PF 100 MG/5ML IV SOSY
PREFILLED_SYRINGE | INTRAVENOUS | Status: DC | PRN
  Administered 2023-05-18: 80 mg via INTRAVENOUS

## 2023-05-18 MED ORDER — PROPOFOL 10 MG/ML IV BOLUS
INTRAVENOUS | Status: AC
  Filled 2023-05-18: qty 20

## 2023-05-18 MED ORDER — GLYCOPYRROLATE 0.2 MG/ML IJ SOLN
INTRAMUSCULAR | Status: AC
  Filled 2023-05-18: qty 1

## 2023-05-18 MED ORDER — GLYCOPYRROLATE 0.2 MG/ML IJ SOLN
INTRAMUSCULAR | Status: DC | PRN
  Administered 2023-05-18: .2 mg via INTRAVENOUS

## 2023-05-18 MED ORDER — DEXMEDETOMIDINE HCL IN NACL 80 MCG/20ML IV SOLN
INTRAVENOUS | Status: DC | PRN
  Administered 2023-05-18: 12 ug via INTRAVENOUS
  Administered 2023-05-18: 8 ug via INTRAVENOUS

## 2023-05-18 MED ORDER — PHENYLEPHRINE 80 MCG/ML (10ML) SYRINGE FOR IV PUSH (FOR BLOOD PRESSURE SUPPORT)
PREFILLED_SYRINGE | INTRAVENOUS | Status: AC
  Filled 2023-05-18: qty 10

## 2023-05-18 MED ORDER — ESMOLOL HCL 100 MG/10ML IV SOLN
INTRAVENOUS | Status: AC
  Filled 2023-05-18: qty 10

## 2023-05-18 MED ORDER — LIDOCAINE HCL (PF) 2 % IJ SOLN
INTRAMUSCULAR | Status: AC
  Filled 2023-05-18: qty 5

## 2023-05-18 MED ORDER — SODIUM CHLORIDE 0.9 % IV SOLN: INTRAVENOUS | Status: DC

## 2023-05-18 MED ORDER — PROPOFOL 500 MG/50ML IV EMUL
INTRAVENOUS | Status: DC | PRN
  Administered 2023-05-18: 75 ug/kg/min via INTRAVENOUS

## 2023-05-18 NOTE — Transfer of Care (Signed)
Immediate Anesthesia Transfer of Care Note  Patient: Henry Owens  Procedure(s) Performed: COLONOSCOPY WITH PROPOFOL ESOPHAGOGASTRODUODENOSCOPY (EGD) WITH PROPOFOL POLYPECTOMY SUBMUCOSAL LIFTING INJECTION HEMOSTASIS CLIP PLACEMENT  Patient Location: PACU  Anesthesia Type:General  Level of Consciousness: sedated  Airway & Oxygen Therapy: Patient Spontanous Breathing  Post-op Assessment: Report given to RN and Post -op Vital signs reviewed and stable  Post vital signs: Reviewed and stable  Last Vitals:  Vitals Value Taken Time  BP 111/60 05/18/23 1441  Temp    Pulse 116 05/18/23 1442  Resp 14 05/18/23 1442  SpO2 93 % 05/18/23 1442  Vitals shown include unfiled device data.  Last Pain:  Vitals:   05/18/23 1310  TempSrc: Temporal  PainSc: 7          Complications: No notable events documented.

## 2023-05-18 NOTE — Interval H&P Note (Signed)
History and Physical Interval Note:  05/18/2023 2:03 PM  Henry Owens  has presented today for surgery, with the diagnosis of FH Colon Can h/o Smoking.  The various methods of treatment have been discussed with the patient and family. After consideration of risks, benefits and other options for treatment, the patient has consented to  Procedure(s): COLONOSCOPY WITH PROPOFOL (N/A) ESOPHAGOGASTRODUODENOSCOPY (EGD) WITH PROPOFOL (N/A) as a surgical intervention.  The patient's history has been reviewed, patient examined, no change in status, stable for surgery.  I have reviewed the patient's chart and labs.  Questions were answered to the patient's satisfaction.     Regis Bill  Ok to proceed with EGD/Colonoscopy

## 2023-05-18 NOTE — Op Note (Signed)
[facility] Patient Name: Henry Owens Procedure Date: 05/18/2023 1:59 PM MRN: [id number] Account #: [id number] Date of Birth: June 21, 1960 Admit Type: Outpatient Age: 63 Room: [facility] ENDO ROOM 3 Gender: Male Note Status: Finalized Instrument Name: Prentice Docker [id number] Procedure:             Colonoscopy Indications:           Screening in patient at increased risk: Family history                         of 1st-degree relative with colorectal cancer Providers:             Eather Colas MD, MD Referring MD:          Eather Colas MD, MD (Referring MD) Medicines:             Monitored Anesthesia Care Complications:         No immediate complications. Estimated blood loss:                         Minimal. Procedure:             Pre-Anesthesia Assessment:                        - Prior to the procedure, a History and Physical was                         performed, and patient medications and allergies were                         reviewed. The patient is competent. The risks and                         benefits of the procedure and the sedation options and                         risks were discussed with the patient. All questions                         were answered and informed consent was obtained.                         Patient identification and proposed procedure were                         verified by the physician, the nurse, the                         anesthesiologist, the anesthetist and the technician                         in the endoscopy suite. Mental Status Examination:                         alert and oriented. Airway Examination: normal                         oropharyngeal airway and neck mobility. Respiratory  Examination: clear to auscultation. CV Examination:                         normal. Prophylactic Antibiotics: The patient does not                         require prophylactic antibiotics. Prior                          Anticoagulants: The patient has taken no anticoagulant                         or antiplatelet agents. ASA Grade Assessment: III - A                         patient with severe systemic disease. After reviewing                         the risks and benefits, the patient was deemed in                         satisfactory condition to undergo the procedure. The                         anesthesia plan was to use monitored anesthesia care                         (MAC). Immediately prior to administration of                         medications, the patient was re-assessed for adequacy                         to receive sedatives. The heart rate, respiratory                         rate, oxygen saturations, blood pressure, adequacy of                         pulmonary ventilation, and response to care were                         monitored throughout the procedure. The physical                         status of the patient was re-assessed after the                         procedure.                        After obtaining informed consent, the colonoscope was                         passed under direct vision. Throughout the procedure,                         the patient's blood pressure, pulse, and oxygen  saturations were monitored continuously. The                         Colonoscope was introduced through the anus and                         advanced to the the cecum, identified by appendiceal                         orifice and ileocecal valve. The colonoscopy was                         performed without difficulty. The patient tolerated                         the procedure well. The quality of the bowel                         preparation was fair. The ileocecal valve, appendiceal                         orifice, and rectum were photographed. Findings:      The perianal and digital rectal examinations were normal.      A 20 mm polyp was  found in the ileocecal valve. The polyp was sessile.       Preparations were made for mucosal resection. Demarcation of the lesion       was performed with high-definition white light and narrow band imaging       to clearly identify the boundaries of the lesion. Eleview was injected       to raise the lesion. Piecemeal mucosal resection using a snare was       performed. Resection and retrieval were complete. Resected tissue       margins were examined and clear of polyp tissue. To close a defect after       polypectomy, three hemostatic clips were successfully placed. There was       no bleeding during, or at the end, of the procedure.      There was a small lipoma, in the sigmoid colon.      Internal hemorrhoids were found during retroflexion. The hemorrhoids       were Grade I (internal hemorrhoids that do not prolapse).      The exam was otherwise without abnormality on direct and retroflexion       views. Impression:            - Preparation of the colon was fair.                        - One 20 mm polyp at the ileocecal valve, removed with                         mucosal resection. Resected and retrieved. Clips were                         placed.                        - Small lipoma in the sigmoid colon.                        -  Internal hemorrhoids.                        - The examination was otherwise normal on direct and                         retroflexion views.                        - Mucosal resection was performed. Resection and                         retrieval were complete. Recommendation:        - Discharge patient to home.                        - Resume previous diet.                        - Continue present medications.                        - Await pathology results.                        - Repeat colonoscopy in 6 months to review the                         polypectomy site and because the bowel preparation was                         suboptimal.                         - Return to referring physician as previously                         scheduled. Procedure Code(s):     --- Professional ---                        [phone], Colonoscopy, flexible; with endoscopic mucosal                         resection Diagnosis Code(s):     --- Professional ---                        Z80.0, Family history of malignant neoplasm of                         digestive organs                        K64.0, First degree hemorrhoids                        D12.0, Benign neoplasm of cecum                        D17.5, Benign lipomatous neoplasm of intra-abdominal                         organs CPT copyright 2022 American Medical Association. All  rights reserved. The codes documented in this report are preliminary and upon coder review may  be revised to meet current compliance requirements. Eather Colas MD, MD 05/18/2023 2:48:22 PM Number of Addenda: 0 Note Initiated On: 05/18/2023 1:59 PM Scope Withdrawal Time: 0 hours 17 minutes 23 seconds  Total Procedure Duration: 0 hours 19 minutes 59 seconds  Estimated Blood Loss:  Estimated blood loss was minimal.      [facility]

## 2023-05-18 NOTE — Anesthesia Preprocedure Evaluation (Addendum)
Anesthesia Evaluation  Patient identified by MRN, date of birth, ID band Patient awake    Reviewed: Allergy & Precautions, NPO status , Patient's Chart, lab work & pertinent test results  Airway Mallampati: II  TM Distance: >3 FB Neck ROM: Full    Dental  (+) Edentulous Upper, Edentulous Lower   Pulmonary sleep apnea , COPD,  COPD inhaler, Current Smoker and Patient abstained from smoking.   Pulmonary exam normal  + decreased breath sounds      Cardiovascular Exercise Tolerance: Good hypertension, Pt. on medications Normal cardiovascular exam Rhythm:Regular Rate:Normal     Neuro/Psych   Anxiety Depression    CVA, Residual Symptoms negative neurological ROS  negative psych ROS   GI/Hepatic negative GI ROS, Neg liver ROS,GERD  Medicated,,  Endo/Other  negative endocrine ROS  Class 3 obesity  Renal/GU negative Renal ROS  negative genitourinary   Musculoskeletal   Abdominal  (+) + obese  Peds negative pediatric ROS (+)  Hematology negative hematology ROS (+)   Anesthesia Other Findings Past Medical History: 2020: Allergy No date: Anxiety No date: Arthritis 11/2022: Blood transfusion without reported diagnosis 2023: COPD (chronic obstructive pulmonary disease) (HCC) No date: Depression No date: Emphysema of lung (HCC) 07/2017: Flu 2023: GERD (gastroesophageal reflux disease) No date: Hyperlipidemia No date: Hypertension 12/11/2022: Intractable nausea and vomiting No date: Nodule of right lung 2023: Oxygen deficiency     Comment:  Only when needed 2024: Sleep apnea 11/2022: Stroke (HCC) No date: Tinnitus  Past Surgical History: 1975: APPENDECTOMY No date: BACK SURGERY 08/20/2017: KNEE ARTHROSCOPY WITH MEDIAL MENISECTOMY; Right     Comment:  Procedure: KNEE ARTHROSCOPY WITH PARTIAL MEDIAL AND               LATERAL MENISECTOMY,PARTIAL SYNOVECTOMY,CHONDROPLASTY,               LOOSE BODY REMOVAL;  Surgeon:  Lyndle Herrlich, MD;                Location: [facility] ORS;  Service: Orthopedics;  Laterality:               Right; 1975: KNEE SURGERY     Comment:  1996, 2000 9 1 89: SPINE SURGERY     Comment:  Last of multiple procedures,  limbar fusion  BMI    Body Mass Index: 28.64 kg/m      Reproductive/Obstetrics negative OB ROS                             Anesthesia Physical Anesthesia Plan  ASA: 3  Anesthesia Plan: General   Post-op Pain Management:    Induction: Intravenous  PONV Risk Score and Plan: Propofol infusion and TIVA  Airway Management Planned: Natural Airway and Nasal Cannula  Additional Equipment:   Intra-op Plan:   Post-operative Plan:   Informed Consent: I have reviewed the patients History and Physical, chart, labs and discussed the procedure including the risks, benefits and alternatives for the proposed anesthesia with the patient or authorized representative who has indicated his/her understanding and acceptance.     Dental Advisory Given  Plan Discussed with: CRNA and Surgeon  Anesthesia Plan Comments:        Anesthesia Quick Evaluation

## 2023-05-18 NOTE — Op Note (Signed)
[facility] Patient Name: Henry Owens Procedure Date: 05/18/2023 1:59 PM MRN: [id number] Account #: [id number] Date of Birth: December 17, 1960 Admit Type: Outpatient Age: 63 Room: [facility] ENDO ROOM 3 Gender: Male Note Status: Finalized Instrument Name: Laurette Schimke [id number] Procedure:             Upper GI endoscopy Indications:           Gastro-esophageal reflux disease, Screening for                         Barrett's esophagus in patient at risk for this                         condition Providers:             Eather Colas MD, MD Referring MD:          Eather Colas MD, MD (Referring MD) Medicines:             Monitored Anesthesia Care Complications:         No immediate complications. Procedure:             Pre-Anesthesia Assessment:                        - Prior to the procedure, a History and Physical was                         performed, and patient medications and allergies were                         reviewed. The patient is competent. The risks and                         benefits of the procedure and the sedation options and                         risks were discussed with the patient. All questions                         were answered and informed consent was obtained.                         Patient identification and proposed procedure were                         verified by the physician, the nurse, the                         anesthesiologist, the anesthetist and the technician                         in the endoscopy suite. Mental Status Examination:                         alert and oriented. Airway Examination: normal                         oropharyngeal airway and neck mobility. Respiratory  Examination: clear to auscultation. CV Examination:                         normal. Prophylactic Antibiotics: The patient does not                         require prophylactic antibiotics. Prior                          Anticoagulants: The patient has taken no anticoagulant                         or antiplatelet agents. ASA Grade Assessment: III - A                         patient with severe systemic disease. After reviewing                         the risks and benefits, the patient was deemed in                         satisfactory condition to undergo the procedure. The                         anesthesia plan was to use monitored anesthesia care                         (MAC). Immediately prior to administration of                         medications, the patient was re-assessed for adequacy                         to receive sedatives. The heart rate, respiratory                         rate, oxygen saturations, blood pressure, adequacy of                         pulmonary ventilation, and response to care were                         monitored throughout the procedure. The physical                         status of the patient was re-assessed after the                         procedure.                        After obtaining informed consent, the endoscope was                         passed under direct vision. Throughout the procedure,                         the patient's blood pressure, pulse, and oxygen  saturations were monitored continuously. The Endoscope                         was introduced through the mouth, and advanced to the                         second part of duodenum. The upper GI endoscopy was                         accomplished without difficulty. The patient tolerated                         the procedure well. Findings:      A non-obstructing Schatzki ring was found in the lower third of the       esophagus.      A small hiatal hernia was present.      The exam of the esophagus was otherwise normal.      The entire examined stomach was normal.      The examined duodenum was normal. Impression:            - Non-obstructing Schatzki ring.                         - Small hiatal hernia.                        - Normal stomach.                        - Normal examined duodenum.                        - No specimens collected. Recommendation:        - Discharge patient to home.                        - Resume previous diet.                        - Continue present medications.                        - Return to referring physician as previously                         scheduled. Procedure Code(s):     --- Professional ---                        [phone], Esophagogastroduodenoscopy, flexible,                         transoral; diagnostic, including collection of                         specimen(s) by brushing or washing, when performed                         (separate procedure) Diagnosis Code(s):     --- Professional ---                        K22.2, Esophageal  obstruction                        K44.9, Diaphragmatic hernia without obstruction or                         gangrene                        K21.9, Gastro-esophageal reflux disease without                         esophagitis                        Z13.810, Encounter for screening for upper                         gastrointestinal disorder CPT copyright 2022 American Medical Association. All rights reserved. The codes documented in this report are preliminary and upon coder review may  be revised to meet current compliance requirements. Eather Colas MD, MD 05/18/2023 2:42:46 PM Number of Addenda: 0 Note Initiated On: 05/18/2023 1:59 PM Estimated Blood Loss:  Estimated blood loss: none.      [facility]

## 2023-05-18 NOTE — H&P (Signed)
Outpatient short stay form Pre-procedure 05/18/2023  Regis Bill, MD  Primary Physician: Debera Lat, PA-C  Reason for visit:  GERD/Screening colonoscopy  History of present illness:    63 y/o gentleman with history of COPD, hypertension, and OSA here for EGD for history of GERD and colonoscopy for colon cancer screening. No blood thinners except aspirin. Mother had colon cancer at unknown age. History of appendectomy.    Current Facility-Administered Medications:    0.9 %  sodium chloride infusion, , Intravenous, Continuous, Koda Routon, Rossie Muskrat, MD, Last Rate: 20 mL/hr at 05/18/23 1330, New Bag at 05/18/23 1330  Medications Prior to Admission  Medication Sig Dispense Refill Last Dose/Taking   acetaminophen (TYLENOL) 500 MG tablet Take 1,000 mg by mouth in the morning, at noon, and at bedtime.   05/17/2023   ALPRAZolam (XANAX) 0.25 MG tablet Take 0.25 mg by mouth 2 (two) times daily as needed for anxiety or sleep.   05/18/2023   aspirin EC 81 MG tablet Take 81 mg by mouth daily. Swallow whole.   05/17/2023   atenolol (TENORMIN) 50 MG tablet Take 25 mg by mouth daily.   05/17/2023   atorvastatin (LIPITOR) 10 MG tablet Take 10 mg by mouth every evening.   05/17/2023   celecoxib (CELEBREX) 100 MG capsule Take 100 mg by mouth 2 (two) times daily.   05/17/2023   Cholecalciferol (D 1000) 25 MCG (1000 UT) capsule Take 1,000 Units by mouth daily.   05/17/2023   cyanocobalamin 1000 MCG tablet Take 1 tablet (1,000 mcg total) by mouth daily. 30 tablet 0 Past Week   gabapentin (NEURONTIN) 300 MG capsule Take 900 mg by mouth 3 (three) times daily.   05/17/2023   Pancrelipase, Lip-Prot-Amyl, (CREON) 24000-76000 units CPEP Take 1 capsule (24,000 Units total) by mouth 3 (three) times daily before meals. 180 capsule 0 05/18/2023   pantoprazole (PROTONIX) 40 MG tablet Take 1 tablet (40 mg total) by mouth daily. 30 tablet 2 05/17/2023   polyethylene glycol (MIRALAX / GLYCOLAX) 17 g packet Take 17 g by  mouth daily as needed (constipation).   Past Week   tiZANidine (ZANAFLEX) 4 MG tablet Take 4 mg by mouth 3 (three) times daily.   05/18/2023   venlafaxine XR (EFFEXOR-XR) 150 MG 24 hr capsule Take 150 mg by mouth daily with breakfast.   05/17/2023   venlafaxine XR (EFFEXOR-XR) 75 MG 24 hr capsule Take 75 mg by mouth daily.   05/17/2023   fluticasone (FLONASE) 50 MCG/ACT nasal spray Place 1 spray into both nostrils daily as needed for allergies or rhinitis.      sildenafil (REVATIO) 20 MG tablet 1-5 tablets by mouth daily as needed, take 30 minutes prior to sexual intercourse. 90 tablet 0      Allergies  Allergen Reactions   Wound Dressing Adhesive Other (See Comments)    BLISTER   Azithromycin Rash   Penicillin G Rash    Has patient had a PCN reaction causing immediate rash, facial/tongue/throat swelling, SOB or lightheadedness with hypotension: Yes Has patient had a PCN reaction causing severe rash involving mucus membranes or skin necrosis: No Has patient had a PCN reaction that required hospitalization: No Has patient had a PCN reaction occurring within the last 10 years: No If all of the above answers are "NO", then may proceed with Cephalosporin use.      Past Medical History:  Diagnosis Date   Allergy 2020   Anxiety    Arthritis    Blood transfusion without  reported diagnosis 11/2022   COPD (chronic obstructive pulmonary disease) (HCC) 2023   Depression    Emphysema of lung (HCC)    Flu 07/2017   GERD (gastroesophageal reflux disease) 2023   Hyperlipidemia    Hypertension    Intractable nausea and vomiting 12/11/2022   Nodule of right lung    Oxygen deficiency 2023   Only when needed   Sleep apnea 2024   Stroke [facility]) 11/2022   Tinnitus     Review of systems:  Otherwise negative.    Physical Exam  Gen: Alert, oriented. Appears stated age.  HEENT: PERRLA. Lungs: No respiratory distress CV: RRR Abd: soft, benign, no masses Ext: No edema    Planned procedures:  Proceed with EGD/colonoscopy. The patient understands the nature of the planned procedure, indications, risks, alternatives and potential complications including but not limited to bleeding, infection, perforation, damage to internal organs and possible oversedation/side effects from anesthesia. The patient agrees and gives consent to proceed.  Please refer to procedure notes for findings, recommendations and patient disposition/instructions.     Regis Bill, MD [facility]

## 2023-05-21 ENCOUNTER — Encounter: Payer: Self-pay | Admitting: Gastroenterology

## 2023-05-21 LAB — SURGICAL PATHOLOGY

## 2023-05-22 ENCOUNTER — Encounter: Payer: Self-pay | Admitting: Physician Assistant

## 2023-05-25 NOTE — Anesthesia Postprocedure Evaluation (Signed)
Anesthesia Post Note  Patient: Henry Owens  Procedure(s) Performed: COLONOSCOPY WITH PROPOFOL ESOPHAGOGASTRODUODENOSCOPY (EGD) WITH PROPOFOL POLYPECTOMY SUBMUCOSAL LIFTING INJECTION HEMOSTASIS CLIP PLACEMENT  Patient location during evaluation: PACU Anesthesia Type: General Level of consciousness: awake and awake and alert Pain management: satisfactory to patient Vital Signs Assessment: post-procedure vital signs reviewed and stable Respiratory status: spontaneous breathing Cardiovascular status: blood pressure returned to baseline Anesthetic complications: no   No notable events documented.   Last Vitals:  Vitals:   05/18/23 1456 05/18/23 1504  BP: 102/77 (!) 138/90  Pulse: (!) 118 (!) 110  Resp: (!) 23 18  Temp:    SpO2: 95% 96%    Last Pain:  Vitals:   05/18/23 1456  TempSrc:   PainSc: 0-No pain                 VAN STAVEREN,Antoneo Ghrist

## 2023-05-28 DIAGNOSIS — E663 Overweight: Secondary | ICD-10-CM | POA: Diagnosis not present

## 2023-05-28 DIAGNOSIS — E559 Vitamin D deficiency, unspecified: Secondary | ICD-10-CM | POA: Diagnosis not present

## 2023-05-29 ENCOUNTER — Other Ambulatory Visit: Payer: Self-pay | Admitting: Physician Assistant

## 2023-05-29 ENCOUNTER — Encounter: Payer: Self-pay | Admitting: Physician Assistant

## 2023-05-29 LAB — COMPREHENSIVE METABOLIC PANEL
ALT: 17 [IU]/L (ref 0–44)
AST: 15 [IU]/L (ref 0–40)
Albumin: 4.4 g/dL (ref 3.9–4.9)
Alkaline Phosphatase: 226 [IU]/L — ABNORMAL HIGH (ref 44–121)
BUN/Creatinine Ratio: 9 — ABNORMAL LOW (ref 10–24)
BUN: 12 mg/dL (ref 8–27)
Bilirubin Total: 0.2 mg/dL (ref 0.0–1.2)
CO2: 22 mmol/L (ref 20–29)
Calcium: 9.7 mg/dL (ref 8.6–10.2)
Chloride: 104 mmol/L (ref 96–106)
Creatinine, Ser: 1.3 mg/dL — ABNORMAL HIGH (ref 0.76–1.27)
Globulin, Total: 2.1 g/dL (ref 1.5–4.5)
Glucose: 118 mg/dL — ABNORMAL HIGH (ref 70–99)
Potassium: 5.1 mmol/L (ref 3.5–5.2)
Sodium: 140 mmol/L (ref 134–144)
Total Protein: 6.5 g/dL (ref 6.0–8.5)
eGFR: 63 mL/min/{1.73_m2} (ref >59)

## 2023-05-29 LAB — CBC WITH DIFFERENTIAL/PLATELET
Basophils Absolute: 0.1 10*3/uL (ref 0.0–0.2)
Basos: 1 %
EOS (ABSOLUTE): 0.2 10*3/uL (ref 0.0–0.4)
Eos: 2 %
Hematocrit: 46.6 % (ref 37.5–51.0)
Hemoglobin: 15.4 g/dL (ref 13.0–17.7)
Immature Grans (Abs): 0 10*3/uL (ref 0.0–0.1)
Immature Granulocytes: 0 %
Lymphocytes Absolute: 2.3 10*3/uL (ref 0.7–3.1)
Lymphs: 22 %
MCH: 29.7 pg (ref 26.6–33.0)
MCHC: 33 g/dL (ref 31.5–35.7)
MCV: 90 fL (ref 79–97)
Monocytes Absolute: 0.6 10*3/uL (ref 0.1–0.9)
Monocytes: 6 %
Neutrophils Absolute: 7.4 10*3/uL — ABNORMAL HIGH (ref 1.4–7.0)
Neutrophils: 69 %
Platelets: 219 10*3/uL (ref 150–450)
RBC: 5.18 x10E6/uL (ref 4.14–5.80)
RDW: 14.5 % (ref 11.6–15.4)
WBC: 10.6 10*3/uL (ref 3.4–10.8)

## 2023-05-29 LAB — LIPID PANEL
Chol/HDL Ratio: 9.2 {ratio} — ABNORMAL HIGH (ref 0.0–5.0)
Cholesterol, Total: 202 mg/dL — ABNORMAL HIGH (ref 100–199)
HDL: 22 mg/dL — ABNORMAL LOW (ref >39)
LDL Chol Calc (NIH): 78 mg/dL (ref 0–99)
Triglycerides: 638 mg/dL (ref 0–149)
VLDL Cholesterol Cal: 102 mg/dL — ABNORMAL HIGH (ref 5–40)

## 2023-05-29 LAB — TSH: TSH: 2.1 u[IU]/mL (ref 0.450–4.500)

## 2023-05-29 LAB — VITAMIN D 25 HYDROXY (VIT D DEFICIENCY, FRACTURES): Vit D, 25-Hydroxy: 27.5 ng/mL — ABNORMAL LOW (ref 30.0–100.0)

## 2023-05-29 LAB — HEMOGLOBIN A1C
Est. average glucose Bld gHb Est-mCnc: 123 mg/dL
Hgb A1c MFr Bld: 5.9 % — ABNORMAL HIGH (ref 4.8–5.6)

## 2023-05-30 ENCOUNTER — Other Ambulatory Visit: Payer: Self-pay

## 2023-05-31 ENCOUNTER — Other Ambulatory Visit: Payer: Self-pay

## 2023-05-31 MED ORDER — VENLAFAXINE HCL ER 150 MG PO CP24
150.0000 mg | ORAL_CAPSULE | Freq: Every day | ORAL | 0 refills | Status: DC
  Filled 2023-05-31: qty 30, 30d supply, fill #0

## 2023-06-01 ENCOUNTER — Encounter: Payer: Self-pay | Admitting: Physician Assistant

## 2023-06-01 ENCOUNTER — Ambulatory Visit (INDEPENDENT_AMBULATORY_CARE_PROVIDER_SITE_OTHER): Payer: Medicaid Other | Admitting: Physician Assistant

## 2023-06-01 VITALS — BP 122/87 | HR 80 | Ht 70.0 in | Wt 201.2 lb

## 2023-06-01 DIAGNOSIS — E559 Vitamin D deficiency, unspecified: Secondary | ICD-10-CM | POA: Diagnosis not present

## 2023-06-01 DIAGNOSIS — Z0001 Encounter for general adult medical examination with abnormal findings: Secondary | ICD-10-CM

## 2023-06-01 DIAGNOSIS — E7849 Other hyperlipidemia: Secondary | ICD-10-CM | POA: Diagnosis not present

## 2023-06-01 DIAGNOSIS — J441 Chronic obstructive pulmonary disease with (acute) exacerbation: Secondary | ICD-10-CM | POA: Diagnosis not present

## 2023-06-01 DIAGNOSIS — Z23 Encounter for immunization: Secondary | ICD-10-CM

## 2023-06-01 DIAGNOSIS — Z0000 Encounter for general adult medical examination without abnormal findings: Secondary | ICD-10-CM

## 2023-06-01 MED ORDER — FENOFIBRATE 48 MG PO TABS: 48.0000 mg | ORAL_TABLET | Freq: Every day | ORAL | 1 refills | Status: DC

## 2023-06-01 MED ORDER — BUDESONIDE-FORMOTEROL FUMARATE 80-4.5 MCG/ACT IN AERO: 2.0000 | INHALATION_SPRAY | Freq: Two times a day (BID) | RESPIRATORY_TRACT | 3 refills | Status: DC

## 2023-06-01 MED ORDER — ALBUTEROL SULFATE HFA 108 (90 BASE) MCG/ACT IN AERS: 2.0000 | INHALATION_SPRAY | Freq: Four times a day (QID) | RESPIRATORY_TRACT | 2 refills | Status: DC | PRN

## 2023-06-02 NOTE — Progress Notes (Signed)
 Established patient visit  Patient: Henry Owens   DOB: Nov 30, 1960   63 y.o. Male  MRN: [id number] Visit Date: 06/01/2023  Today's healthcare provider: Jolynn Spencer, PA-C   Chief Complaint  Patient presents with   Annual Exam    Diet - General, well balanced Exercise - none Feeling - well Sleeping - fairly well Concerns - discuss cholesterol from lab results and see what changes need to be made as he is taking atorvastatin  10 mg, discuss A1c now showing pre-diabetic, referral to pulmonology for emphysema and copd as well as lung on nodule   Immunizations    Tetanus - will receive at next visit   Subjective     Discussed the use of AI scribe software for clinical note transcription with the patient, who gave verbal consent to proceed.  History of Present Illness   The patient, with a history of anxiety, COPD, and high blood pressure, presents for a follow-up visit primarily concerning his COPD. He reports using Albuterol  as a rescue inhaler and Dulera  as a maintenance inhaler. The patient's spouse mentions difficulty in finding a pulmonologist for further management of the patient's COPD. The patient also reports experiencing wheezing and coughing, with the spouse noting audible wheezing at night.  In addition to the COPD, the patient's recent lab results indicate slightly low vitamin D  levels and high cholesterol. The patient denies any dietary changes or intake prior to the cholesterol test. The patient also has severe sleep apnea, as confirmed by a sleep study conducted over the summer. However, he has been non-compliant with using the prescribed CPAP machine.           06/01/2023    1:55 PM 05/03/2023    2:19 PM  Depression screen PHQ 2/9  Decreased Interest 3 2  Down, Depressed, Hopeless 3 2  PHQ - 2 Score 6 4  Altered sleeping 3 2  Tired, decreased energy 3 2  Change in appetite 3 1  Feeling bad or failure about yourself  3 0  Trouble concentrating 2 0  Moving  slowly or fidgety/restless 0 0  Suicidal thoughts 0 0  PHQ-9 Score 20 9  Difficult doing work/chores Somewhat difficult Somewhat difficult      06/01/2023    1:55 PM 05/03/2023    2:19 PM  GAD 7 : Generalized Anxiety Score  Nervous, Anxious, on Edge 3 2  Control/stop worrying 3 2  Worry too much - different things 3 2  Trouble relaxing 3 2  Restless 0 0  Easily annoyed or irritable 0 1  Afraid - awful might happen 3 0  Total GAD 7 Score 15 9  Anxiety Difficulty Very difficult Somewhat difficult    Medications: Outpatient Medications Prior to Visit  Medication Sig   acetaminophen  (TYLENOL ) 500 MG tablet Take 1,000 mg by mouth in the morning, at noon, and at bedtime.   ALPRAZolam  (XANAX ) 0.25 MG tablet Take 0.25 mg by mouth 2 (two) times daily as needed for anxiety or sleep.   aspirin  EC 81 MG tablet Take 81 mg by mouth daily. Swallow whole.   atenolol  (TENORMIN ) 50 MG tablet Take 25 mg by mouth daily.   atorvastatin  (LIPITOR) 10 MG tablet Take 10 mg by mouth every evening.   celecoxib  (CELEBREX ) 100 MG capsule Take 100 mg by mouth 2 (two) times daily.   Cholecalciferol (D 1000) 25 MCG (1000 UT) capsule Take 1,000 Units by mouth daily.   cyanocobalamin  1000 MCG tablet Take 1 tablet (1,000 mcg  total) by mouth daily.   fluticasone  (FLONASE ) 50 MCG/ACT nasal spray Place 1 spray into both nostrils daily as needed for allergies or rhinitis.   gabapentin  (NEURONTIN ) 300 MG capsule Take 900 mg by mouth 3 (three) times daily.   Pancrelipase , Lip-Prot-Amyl, (CREON ) 24000-76000 units CPEP Take 1 capsule (24,000 Units total) by mouth 3 (three) times daily before meals.   pantoprazole  (PROTONIX ) 40 MG tablet Take 1 tablet (40 mg total) by mouth daily.   polyethylene glycol (MIRALAX  / GLYCOLAX ) 17 g packet Take 17 g by mouth daily as needed (constipation).   sildenafil  (REVATIO ) 20 MG tablet 1-5 tablets by mouth daily as needed, take 30 minutes prior to sexual intercourse.   tiZANidine  (ZANAFLEX )  4 MG tablet Take 4 mg by mouth 3 (three) times daily.   venlafaxine  XR (EFFEXOR -XR) 150 MG 24 hr capsule Take 1 capsule (150 mg total) by mouth daily with breakfast.   No facility-administered medications prior to visit.    Review of Systems All negative Except see HPI       Objective    BP 122/87 (BP Location: Left Arm, Patient Position: Sitting, Cuff Size: Normal)   Pulse 80   Ht 5' 10 (1.778 m)   Wt 201 lb 3.2 oz (91.3 kg)   SpO2 97%   BMI 28.87 kg/m     Physical Exam Vitals reviewed.  Constitutional:      General: He is not in acute distress.    Appearance: Normal appearance. He is well-developed. He is not ill-appearing, toxic-appearing or diaphoretic.  HENT:     Head: Normocephalic and atraumatic.     Right Ear: Tympanic membrane, ear canal and external ear normal.     Left Ear: Tympanic membrane, ear canal and external ear normal.     Nose: Nose normal. No congestion or rhinorrhea.     Mouth/Throat:     Mouth: Mucous membranes are moist.     Pharynx: Oropharynx is clear. No oropharyngeal exudate.  Eyes:     General: No scleral icterus.       Right eye: No discharge.        Left eye: No discharge.     Conjunctiva/sclera: Conjunctivae normal.     Pupils: Pupils are equal, round, and reactive to light.  Neck:     Thyroid: No thyromegaly.     Vascular: No carotid bruit.  Cardiovascular:     Rate and Rhythm: Normal rate and regular rhythm.     Pulses: Normal pulses.     Heart sounds: Normal heart sounds. No murmur heard.    No friction rub. No gallop.  Pulmonary:     Effort: Pulmonary effort is normal. No respiratory distress.     Breath sounds: Normal breath sounds. No wheezing or rales.  Abdominal:     General: Abdomen is flat. Bowel sounds are normal. There is no distension.     Palpations: Abdomen is soft. There is no mass.     Tenderness: There is no abdominal tenderness. There is no right CVA tenderness, left CVA tenderness, guarding or rebound.      Hernia: No hernia is present.  Musculoskeletal:        General: No swelling, tenderness, deformity or signs of injury. Normal range of motion.     Cervical back: Normal range of motion and neck supple. No rigidity or tenderness.     Right lower leg: No edema.     Left lower leg: No edema.  Lymphadenopathy:  Cervical: No cervical adenopathy.  Skin:    General: Skin is warm and dry.     Coloration: Skin is not jaundiced or pale.     Findings: No bruising, erythema, lesion or rash.  Neurological:     Mental Status: He is alert and oriented to person, place, and time. Mental status is at baseline.     Gait: Gait normal.  Psychiatric:        Mood and Affect: Mood normal.        Behavior: Behavior normal.        Thought Content: Thought content normal.        Judgment: Judgment normal.      No results found for any visits on 06/01/23.      Assessment and Plan    COPD with acute exacerbation (HCC) Chronic and stable - Ambulatory referral to Pulmonology - budesonide -formoterol  (SYMBICORT ) 80-4.5 MCG/ACT inhaler; Inhale 2 puffs into the lungs 2 (two) times daily.  Dispense: 1 each; Refill: 3 - albuterol  (VENTOLIN  HFA) 108 (90 Base) MCG/ACT inhaler; Inhale 2 puffs into the lungs every 6 (six) hours as needed for wheezing or shortness of breath.  Dispense: 8 g; Refill: 2 - fenofibrate  (TRICOR ) 48 MG tablet; Take 1 tablet (48 mg total) by mouth daily.  Dispense: 30 tablet; Refill: 1  Annual physical exam (Primary) UTD on dental/eye Things to do to keep yourself healthy  - Exercise at least 30-45 minutes a day, 3-4 days a week.  - Eat a low-fat diet with lots of fruits and vegetables, up to 7-9 servings per day.  - Seatbelts can save your life. Wear them always.  - Smoke detectors on every level of your home, check batteries every year.  - Eye Doctor - have an eye exam every 1-2 years  - Safe sex - if you may be exposed to STDs, use a condom.  - Alcohol -  If you drink, do it  moderately, less than 2 drinks per day.  - Health Care Power of Attorney. Choose someone to speak for you if you are not able.  - Depression is common in our stressful world.If you're feeling down or losing interest in things you normally enjoy, please come in for a visit.  - Violence - If anyone is threatening or hurting you, please call immediately.   Chronic Obstructive Pulmonary Disease (COPD) Persistent symptoms of shortness of breath, wheezing, and cough despite use of Albuterol  and Dulera . -Refer to pulmonology for further evaluation and management. -Renew Symbicort  prescription.  Hypertriglyceridemia Noted significantly elevated triglycerides, posing risk for pancreatitis. -Start Fenofibrate  at a low dose. -Encourage dietary modifications including lean proteins, healthy fats, and increased intake of vegetables and fruits. -Recommend Omega-3 fish oil supplements. -Check lipid panel in one month.  Vitamin D  Deficiency Slightly low levels, potentially contributing to fatigue and musculoskeletal health. -Recommend over-the-counter Vitamin D3 (cholecalciferol) supplement, 1000-2000 IU daily. -Recheck Vitamin D  levels in three months.  Sleep Apnea Severe sleep apnea diagnosed in the past summer, but patient has not been using the prescribed CPAP machine. -Discuss the benefits of CPAP therapy and encourage patient to consider using the machine. -If patient agrees, arrange for a follow-up visit to confirm usage and effectiveness.  General Health Maintenance -Recommend daily multivitamin (Centrum). -Administer tetanus shot today. -Schedule follow-up visit in one month.      Orders Placed This Encounter  Procedures   Ambulatory referral to Pulmonology    Referral Priority:   Routine    Referral Type:  Consultation    Referral Reason:   Specialty Services Required    Requested Specialty:   Pulmonary Disease    Number of Visits Requested:   1    Return in about 4 weeks  (around 06/29/2023) for chronic disease f/u.   The patient was advised to call back or seek an in-person evaluation if the symptoms worsen or if the condition fails to improve as anticipated.  I discussed the assessment and treatment plan with the patient. The patient was provided an opportunity to ask questions and all were answered. The patient agreed with the plan and demonstrated an understanding of the instructions.  I, Renie Stelmach, PA-C have reviewed all documentation for this visit. The documentation on 06/01/2023  for the exam, diagnosis, procedures, and orders are all accurate and complete.  Jolynn Spencer, [facility], MMS [facility] [phone] (phone) [phone] (fax)  [facility]

## 2023-06-03 ENCOUNTER — Encounter: Payer: Self-pay | Admitting: Physician Assistant

## 2023-06-03 DIAGNOSIS — F419 Anxiety disorder, unspecified: Secondary | ICD-10-CM

## 2023-06-04 ENCOUNTER — Other Ambulatory Visit: Payer: Self-pay | Admitting: Physician Assistant

## 2023-06-04 ENCOUNTER — Other Ambulatory Visit: Payer: Self-pay

## 2023-06-04 DIAGNOSIS — F32A Depression, unspecified: Secondary | ICD-10-CM

## 2023-06-04 MED ORDER — VENLAFAXINE HCL ER 150 MG PO CP24: 150.0000 mg | ORAL_CAPSULE | Freq: Every day | ORAL | 0 refills | Status: DC

## 2023-06-04 NOTE — Telephone Encounter (Signed)
 Medication Refill -  Most Recent Primary Care Visit:  Provider: OSTWALT, JANNA  Department: [id number]  Visit Type: NEW PATIENT  Date: 05/03/2023  Medication: ALPRAZolam  (XANAX ) 0.25 MG tablet and venlafaxine  XR (EFFEXOR -XR) 150 MG 24 hr capsule   Has the patient contacted their pharmacy? Yes  Is this the correct pharmacy for this prescription? Yes If no, delete pharmacy and type the correct one.  This is the patient's preferred pharmacy:  [id number] [place], [place] - [address] AT [facility] [address] [place] [address] Phone: [phone] Fax: [phone]   Has the prescription been filled recently? No  Is the patient out of the medication? Yes  Has the patient been seen for an appointment in the last year OR does the patient have an upcoming appointment? Yes  Can we respond through MyChart? No  Agent: Please be advised that Rx refills may take up to 3 business days. We ask that you follow-up with your pharmacy.

## 2023-06-05 ENCOUNTER — Ambulatory Visit: Payer: Self-pay | Admitting: *Deleted

## 2023-06-05 NOTE — Telephone Encounter (Signed)
Last refilled 06/04/23 duplicate request.  Requested Prescriptions  Pending Prescriptions Disp Refills   ALPRAZolam (XANAX) 0.25 MG tablet 30 tablet     Sig: Take 1 tablet (0.25 mg total) by mouth 2 (two) times daily as needed for anxiety or sleep.     Not Delegated - Psychiatry: Anxiolytics/Hypnotics 2 Failed - 06/05/2023  1:59 PM      Failed - This refill cannot be delegated      Passed - Urine Drug Screen completed in last 360 days      Passed - Patient is not pregnant      Passed - Valid encounter within last 6 months    Recent Outpatient Visits           1 month ago Overweight   [place] [facility] [place], [place], PA-C       Future Appointments             In 3 weeks Ostwalt, [place], PA-C [facility]            Refused Prescriptions Disp Refills   venlafaxine XR (EFFEXOR-XR) 150 MG 24 hr capsule 30 capsule 0    Sig: Take 1 capsule (150 mg total) by mouth daily with breakfast.     Psychiatry: Antidepressants - SNRI - desvenlafaxine & venlafaxine Failed - 06/05/2023  1:59 PM      Failed - Cr in normal range and within 360 days    Creatinine, Ser  Date Value Ref Range Status  05/28/2023 1.30 (H) 0.76 - 1.27 mg/dL Final         Failed - Lipid Panel in normal range within the last 12 months    Cholesterol, Total  Date Value Ref Range Status  05/28/2023 202 (H) 100 - 199 mg/dL Final   LDL Chol Calc (NIH)  Date Value Ref Range Status  05/28/2023 78 0 - 99 mg/dL Final   Direct LDL  Date Value Ref Range Status  11/28/2022 55 0 - 99 mg/dL Final    Comment:    Performed at [facility], [address] [place], [place] [address]   HDL  Date Value Ref Range Status  05/28/2023 22 (L) >39 mg/dL Final   Triglycerides  Date Value Ref Range Status  05/28/2023 638 (HH) 0 - 149 mg/dL Final         Passed - Last BP in normal range    BP Readings from Last 1 Encounters:  06/01/23 122/87         Passed -  Valid encounter within last 6 months    Recent Outpatient Visits           1 month ago Overweight   [place] [facility] [place], [place], PA-C       Future Appointments             In 3 weeks Debera Lat, PA-C  [facility]

## 2023-06-05 NOTE — Telephone Encounter (Signed)
Requested medication (s) are due for refill today: routing for review  Requested medication (s) are on the active medication list: yes  Last refill:  11/11/21  Future visit scheduled: yes  Notes to clinic:  Unable to refill per protocol, cannot delegate.      Requested Prescriptions  Pending Prescriptions Disp Refills   ALPRAZolam (XANAX) 0.25 MG tablet 30 tablet     Sig: Take 1 tablet (0.25 mg total) by mouth 2 (two) times daily as needed for anxiety or sleep.     Not Delegated - Psychiatry: Anxiolytics/Hypnotics 2 Failed - 06/05/2023  1:59 PM      Failed - This refill cannot be delegated      Passed - Urine Drug Screen completed in last 360 days      Passed - Patient is not pregnant      Passed - Valid encounter within last 6 months    Recent Outpatient Visits           1 month ago Overweight   [place] [facility] [place], [place], PA-C       Future Appointments             In 3 weeks Ostwalt, [place], PA-C [place] [facility]            Refused Prescriptions Disp Refills   venlafaxine XR (EFFEXOR-XR) 150 MG 24 hr capsule 30 capsule 0    Sig: Take 1 capsule (150 mg total) by mouth daily with breakfast.     Psychiatry: Antidepressants - SNRI - desvenlafaxine & venlafaxine Failed - 06/05/2023  1:59 PM      Failed - Cr in normal range and within 360 days    Creatinine, Ser  Date Value Ref Range Status  05/28/2023 1.30 (H) 0.76 - 1.27 mg/dL Final         Failed - Lipid Panel in normal range within the last 12 months    Cholesterol, Total  Date Value Ref Range Status  05/28/2023 202 (H) 100 - 199 mg/dL Final   LDL Chol Calc (NIH)  Date Value Ref Range Status  05/28/2023 78 0 - 99 mg/dL Final   Direct LDL  Date Value Ref Range Status  11/28/2022 55 0 - 99 mg/dL Final    Comment:    Performed at [facility], [address] [place], [place] [address]   HDL  Date Value Ref Range Status  05/28/2023 22 (L)  >39 mg/dL Final   Triglycerides  Date Value Ref Range Status  05/28/2023 638 (HH) 0 - 149 mg/dL Final         Passed - Last BP in normal range    BP Readings from Last 1 Encounters:  06/01/23 122/87         Passed - Valid encounter within last 6 months    Recent Outpatient Visits           1 month ago Overweight    [facility] [place], [place], PA-C       Future Appointments             In 3 weeks Debera Lat, PA-C [facility]

## 2023-06-05 NOTE — Telephone Encounter (Signed)
Reason for Disposition  MODERATE anxiety (e.g., persistent or frequent anxiety symptoms; interferes with sleep, school, or work)  Answer Assessment - Initial Assessment Questions 1. CONCERN: "Did anything happen that prompted you to call today?"      Out of anxiety medication- patient has changed PCP and he was prescribing- patient states J Ostwalt reviewed medication and agreed to refill everything  2. ANXIETY SYMPTOMS: "Can you describe how you (your loved one; patient) have been feeling?" (e.g., tense, restless, panicky, anxious, keyed up, overwhelmed, sense of impending doom).      Anxious now 3. ONSET: "How long have you been feeling this way?" (e.g., hours, days, weeks)     Out of medication since yesterday- alprazolam  4. SEVERITY: "How would you rate the level of anxiety?" (e.g., 0 - 10; or mild, moderate, severe).     5-6/10 5. FUNCTIONAL IMPAIRMENT: "How have these feelings affected your ability to do daily activities?" "Have you had more difficulty than usual doing your normal daily activities?" (e.g., getting better, same, worse; self-care, school, work, interactions)     More difficult to fall asleep 6. HISTORY: "Have you felt this way before?" "Have you ever been diagnosed with an anxiety problem in the past?" (e.g., generalized anxiety disorder, panic attacks, PTSD). If Yes, ask: "How was this problem treated?" (e.g., medicines, counseling, etc.)     Patient states he has never been off this medication  7. RISK OF HARM - SUICIDAL IDEATION: "Do you ever have thoughts of hurting or killing yourself?" If Yes, ask:  "Do you have these feelings now?" "Do you have a plan on how you would do this?"     No- no plans 8. TREATMENT:  "What has been done so far to treat this anxiety?" (e.g., medicines, relaxation strategies). "What has helped?"     Patient has been struggling 9. TREATMENT - THERAPIST: "Do you have a counselor or therapist? Name?"     Does not have therapist- was discussed at  last visit 10. POTENTIAL TRIGGERS: "Do you drink caffeinated beverages (e.g., coffee, colas, teas), and how much daily?" "Do you drink alcohol or use any drugs?" "Have you started any new medicines recently?"       Venlafaxine- has not started  11. PATIENT SUPPORT: "Who is with you now?" "Who do you live with?" "Do you have family or friends who you can talk to?"        Yes- good support 12. OTHER SYMPTOMS: "Do you have any other symptoms?" (e.g., feeling depressed, trouble concentrating, trouble sleeping, trouble breathing, palpitations or fast heartbeat, chest pain, sweating, nausea, or diarrhea)       Diarrhea, lack of appetite  Protocols used: Anxiety and Panic Attack-A-AH

## 2023-06-05 NOTE — Telephone Encounter (Signed)
Out of medication- requesting step down dosing Chief Complaint: anxiety without medication- withdrawal Symptoms: anxious, sleep disturbance, no appetite, diarrhea  Frequency: patient has been out of medication 1 day Pertinent Negatives: Patient denies suicidal thoughts Disposition: [] ED /[] Urgent Care (no appt availability in office) / [] Appointment(In office/virtual)/ []  [facility]/ [] Home Care/ [] Refused Recommended Disposition /[] [facility]/ [x]  Follow-up with PCP Additional Notes: Patient states change in medication was discussed- but he was not expecting abrupt stop. Patient states he has not started the new medication -  venlafaxine XR (EFFEXOR-XR) 150 MG 24 hr capsule yet. Patient states he has been without his ALPRAZolam (XANAX) 0.25 MG tablet  and is having withdrawal- increased anxiety. Did review last 2 visits with provider- and concerns for continuing medication- he is aware of concerns. No open appointment with provider today- will send message for PCP review and response to patient concerns.

## 2023-06-06 MED ORDER — ALPRAZOLAM 0.25 MG PO TABS: 0.2500 mg | ORAL_TABLET | Freq: Two times a day (BID) | ORAL | 0 refills | Status: DC | PRN

## 2023-06-06 NOTE — Telephone Encounter (Signed)
Pt needs to start taking effexor and take alprazolam only as needed. I will place a referral to [facility] if he agrees to help tapering alprazolam. If he needs an urgent care, please, proceed to 24 hrs walk-in clinic for [facility] in [place] [address], [place], [place] [address] [phone] Also, I could place a referral to pharmacy and check with them if they can help with med tapering if you agree

## 2023-06-08 DIAGNOSIS — M5412 Radiculopathy, cervical region: Secondary | ICD-10-CM | POA: Diagnosis not present

## 2023-06-08 DIAGNOSIS — M5416 Radiculopathy, lumbar region: Secondary | ICD-10-CM | POA: Diagnosis not present

## 2023-06-08 DIAGNOSIS — M48062 Spinal stenosis, lumbar region with neurogenic claudication: Secondary | ICD-10-CM | POA: Diagnosis not present

## 2023-06-08 DIAGNOSIS — Z79899 Other long term (current) drug therapy: Secondary | ICD-10-CM | POA: Diagnosis not present

## 2023-06-08 DIAGNOSIS — M4802 Spinal stenosis, cervical region: Secondary | ICD-10-CM | POA: Diagnosis not present

## 2023-06-08 DIAGNOSIS — G959 Disease of spinal cord, unspecified: Secondary | ICD-10-CM | POA: Diagnosis not present

## 2023-06-18 ENCOUNTER — Institutional Professional Consult (permissible substitution): Payer: Medicaid Other | Admitting: Pulmonary Disease

## 2023-06-18 DIAGNOSIS — J449 Chronic obstructive pulmonary disease, unspecified: Secondary | ICD-10-CM | POA: Diagnosis not present

## 2023-06-22 ENCOUNTER — Other Ambulatory Visit: Payer: Self-pay | Admitting: Physician Assistant

## 2023-06-22 DIAGNOSIS — F419 Anxiety disorder, unspecified: Secondary | ICD-10-CM

## 2023-06-22 NOTE — Telephone Encounter (Signed)
 Requested medication (s) are due for refill today: Yes  Requested medication (s) are on the active medication list: Yes  Last refill:  06/06/23  Future visit scheduled: Yes  Notes to clinic:  Unable to refill per protocol, cannot delegate.       Requested Prescriptions  Pending Prescriptions Disp Refills   ALPRAZolam (XANAX) 0.25 MG tablet [Pharmacy Med Name: ALPRAZOLAM 0.25MG  TABLETS] 30 tablet     Sig: TAKE 1 TABLET(0.25 MG) BY MOUTH TWICE DAILY AS NEEDED FOR ANXIETY OR SLEEP     Not Delegated - Psychiatry: Anxiolytics/Hypnotics 2 Failed - 06/22/2023  4:15 PM      Failed - This refill cannot be delegated      Passed - Urine Drug Screen completed in last 360 days      Passed - Patient is not pregnant      Passed - Valid encounter within last 6 months    Recent Outpatient Visits           1 month ago Overweight   [facility] [place], [place], PA-C       Future Appointments             In 1 week Debera Lat, PA-C [facility]

## 2023-06-26 ENCOUNTER — Institutional Professional Consult (permissible substitution): Payer: Medicaid Other | Admitting: Pulmonary Disease

## 2023-06-29 ENCOUNTER — Ambulatory Visit: Payer: Medicaid Other | Admitting: Physician Assistant

## 2023-06-29 ENCOUNTER — Encounter: Payer: Self-pay | Admitting: Physician Assistant

## 2023-06-29 VITALS — BP 130/76 | HR 80 | Temp 98.1°F | Resp 16 | Ht 70.0 in | Wt 200.0 lb

## 2023-06-29 DIAGNOSIS — B370 Candidal stomatitis: Secondary | ICD-10-CM | POA: Diagnosis not present

## 2023-06-29 DIAGNOSIS — G629 Polyneuropathy, unspecified: Secondary | ICD-10-CM

## 2023-06-29 DIAGNOSIS — J069 Acute upper respiratory infection, unspecified: Secondary | ICD-10-CM | POA: Diagnosis not present

## 2023-06-29 DIAGNOSIS — F5105 Insomnia due to other mental disorder: Secondary | ICD-10-CM | POA: Diagnosis not present

## 2023-06-29 DIAGNOSIS — G4739 Other sleep apnea: Secondary | ICD-10-CM | POA: Diagnosis not present

## 2023-06-29 DIAGNOSIS — E7849 Other hyperlipidemia: Secondary | ICD-10-CM

## 2023-06-29 DIAGNOSIS — F99 Mental disorder, not otherwise specified: Secondary | ICD-10-CM

## 2023-06-29 MED ORDER — NYSTATIN 100000 UNIT/ML MT SUSP: 5.0000 mL | Freq: Four times a day (QID) | OROMUCOSAL | 0 refills | Status: DC

## 2023-06-29 NOTE — Progress Notes (Signed)
 Established patient visit  Patient: Henry Owens   DOB: Jan 02, 1961   63 y.o. Male  MRN: [id number] Visit Date: 06/29/2023  Today's healthcare provider: Debera Lat, PA-C   Chief Complaint  Patient presents with   Sleep Apnea    Tetanus shot   Subjective     Discussed the use of AI scribe software for clinical note transcription with the patient, who gave verbal consent to proceed.  History of Present Illness   The patient, with a history of mini strokes, depression, anxiety, and sleep apnea, presents with a sore throat, neck pain, and chest pain that have been present for about four days. The patient also reports insomnia and is currently on an antipsychotic medication prescribed by a neurologist. The patient's spouse reports that the patient has been feeling unwell and has been experiencing discomfort in the mouth. The patient also reports neuropathy, particularly in the hands. The patient is currently on gabapentin for this issue. The patient also has a history of using a CPAP machine and inhalers for sleep apnea. The patient's spouse reports that the patient has not been using the inhalers as prescribed.           06/29/2023    1:09 PM 06/01/2023    1:55 PM 05/03/2023    2:19 PM  Depression screen PHQ 2/9  Decreased Interest 2 3 2   Down, Depressed, Hopeless 2 3 2   PHQ - 2 Score 4 6 4   Altered sleeping 3 3 2   Tired, decreased energy 3 3 2   Change in appetite 0 3 1  Feeling bad or failure about yourself  2 3 0  Trouble concentrating 2 2 0  Moving slowly or fidgety/restless 0 0 0  Suicidal thoughts 0 0 0  PHQ-9 Score 14 20 9   Difficult doing work/chores Very difficult Somewhat difficult Somewhat difficult      06/29/2023    1:09 PM 06/01/2023    1:55 PM 05/03/2023    2:19 PM  GAD 7 : Generalized Anxiety Score  Nervous, Anxious, on Edge 3 3 2   Control/stop worrying 3 3 2   Worry too much - different things 3 3 2   Trouble relaxing 3 3 2   Restless 0 0 0  Easily annoyed  or irritable 0 0 1  Afraid - awful might happen 0 3 0  Total GAD 7 Score 12 15 9   Anxiety Difficulty Very difficult Very difficult Somewhat difficult    Medications: Outpatient Medications Prior to Visit  Medication Sig   acetaminophen (TYLENOL) 500 MG tablet Take 1,000 mg by mouth in the morning, at noon, and at bedtime.   albuterol (VENTOLIN HFA) 108 (90 Base) MCG/ACT inhaler Inhale 2 puffs into the lungs every 6 (six) hours as needed for wheezing or shortness of breath.   ALPRAZolam (XANAX) 0.25 MG tablet Take 1 tablet (0.25 mg total) by mouth 2 (two) times daily as needed for anxiety or sleep. Take 0.25 mg by mouth 2 (two) times daily as needed for anxiety or sleep.   aspirin EC 81 MG tablet Take 81 mg by mouth daily. Swallow whole.   atenolol (TENORMIN) 50 MG tablet Take 25 mg by mouth daily.   atorvastatin (LIPITOR) 10 MG tablet Take 10 mg by mouth every evening.   budesonide-formoterol (SYMBICORT) 80-4.5 MCG/ACT inhaler Inhale 2 puffs into the lungs 2 (two) times daily.   celecoxib (CELEBREX) 100 MG capsule Take 100 mg by mouth 2 (two) times daily.   Cholecalciferol (D 1000) 25 MCG (1000  UT) capsule Take 1,000 Units by mouth daily.   cyanocobalamin 1000 MCG tablet Take 1 tablet (1,000 mcg total) by mouth daily.   fenofibrate (TRICOR) 48 MG tablet Take 1 tablet (48 mg total) by mouth daily.   fluticasone (FLONASE) 50 MCG/ACT nasal spray Place 1 spray into both nostrils daily as needed for allergies or rhinitis.   gabapentin (NEURONTIN) 300 MG capsule Take 900 mg by mouth 3 (three) times daily.   Pancrelipase, Lip-Prot-Amyl, (CREON) 24000-76000 units CPEP Take 1 capsule (24,000 Units total) by mouth 3 (three) times daily before meals.   pantoprazole (PROTONIX) 40 MG tablet Take 1 tablet (40 mg total) by mouth daily.   polyethylene glycol (MIRALAX / GLYCOLAX) 17 g packet Take 17 g by mouth daily as needed (constipation).   sildenafil (REVATIO) 20 MG tablet 1-5 tablets by mouth daily as  needed, take 30 minutes prior to sexual intercourse.   tiZANidine (ZANAFLEX) 4 MG tablet Take 4 mg by mouth 3 (three) times daily.   venlafaxine XR (EFFEXOR-XR) 150 MG 24 hr capsule Take 1 capsule (150 mg total) by mouth daily with breakfast.   No facility-administered medications prior to visit.    Review of Systems All negative Except see HPI       Objective    BP 130/76 (BP Location: Left Arm, Patient Position: Sitting, Cuff Size: Large)   Pulse 80   Temp 98.1 F (36.7 C) (Oral)   Resp 16   Ht 5\' 10"  (1.778 m)   Wt 200 lb (90.7 kg)   SpO2 97%   BMI 28.70 kg/m     Physical Exam Vitals reviewed.  Constitutional:      General: He is not in acute distress.    Appearance: Normal appearance. He is not diaphoretic.  HENT:     Head: Normocephalic and atraumatic.  Eyes:     General: No scleral icterus.    Conjunctiva/sclera: Conjunctivae normal.  Cardiovascular:     Rate and Rhythm: Normal rate and regular rhythm.     Pulses: Normal pulses.     Heart sounds: Normal heart sounds. No murmur heard. Pulmonary:     Effort: Pulmonary effort is normal. No respiratory distress.     Breath sounds: Normal breath sounds. No wheezing or rhonchi.  Musculoskeletal:     Cervical back: Neck supple.     Right lower leg: No edema.     Left lower leg: No edema.  Lymphadenopathy:     Cervical: No cervical adenopathy.  Skin:    General: Skin is warm and dry.     Findings: No rash.  Neurological:     Mental Status: He is alert and oriented to person, place, and time. Mental status is at baseline.  Psychiatric:        Mood and Affect: Mood normal.        Behavior: Behavior normal.      No results found for any visits on 06/29/23.      Assessment and Plan    Hypertension Chronic Blood pressure today was 130/76, not at goal Continue taking atenolol 50 and adhere to low-salt diet Will follow-up  Hyperlipidemia chronic Patient is on medication and has made dietary changes.  Awaiting repeat lipid panel. -Continue current medication and dietary changes. -Redo lipid panel. will fu  Sleep Apnea Patient is on CPAP therapy and wishes to be retested. Discussed options for in-lab sleep study vs home sleep study. -Refer to sleep study provider in [place] for retesting. will fu  Insomnia cchronic Patient is on antipsychotic medication (quetiapine) for sleep. Discussed need for psychiatric evaluation. -Refer to psychiatric urgent care in [facility] for evaluation and management. will fu  Neuropathy chronic Patient reports neuropathic pain in right arm. Currently on gabapentin and Tylenol for pain management. -Continue gabapentin and Tylenol as needed for pain. -Consider increasing gabapentin dose if necessary. will fu  Oral Thrush Likely secondary to inhaler use without adequate mouth rinsing. -Prescribe mouthwash for thrush treatment. -Advise patient to rinse mouth thoroughly after inhaler use.  Upper Respiratory Symptoms Patient reports sore throat, ear fullness, and postnasal drainage. Exam reveals fluid in ears and postnasal drainage. -Prescribe Zyrtec samples for symptom relief. -Advise use of saline rinse and continuation of Flonase.  Vitamin D deficiency/insufficiency Patient has  been on Vitamin D supplementation. -Continue Vitamin D supplementation. -Advise gentle stretching exercises and joint gymnastics for mobility and bone health.  Follow-up in 3 months for reevaluation of all issues.     Orders Placed This Encounter  Procedures   Lipid panel    Has the patient fasted?:   Yes    No follow-ups on file.   The patient was advised to call back or seek an in-person evaluation if the symptoms worsen or if the condition fails to improve as anticipated.  I discussed the assessment and treatment plan with the patient. The patient was provided an opportunity to ask questions and all were answered. The patient agreed with the plan and  demonstrated an understanding of the instructions.  I, Debera Lat, PA-C have reviewed all documentation for this visit. The documentation on 06/29/2023  for the exam, diagnosis, procedures, and orders are all accurate and complete.  Debera Lat, [facility], MMS [facility] [phone] (phone) [phone] (fax)  [facility]

## 2023-07-01 NOTE — Progress Notes (Incomplete)
 Established patient visit  Patient: Henry Owens   DOB: 02-Jun-1960   63 y.o. Male  MRN: [id number] Visit Date: 06/29/2023  Today's healthcare provider: Debera Lat, PA-C   Chief Complaint  Patient presents with  . Sleep Apnea    Tetanus shot   Subjective     Discussed the use of AI scribe software for clinical note transcription with the patient, who gave verbal consent to proceed.  History of Present Illness   The patient, with a history of mini strokes, depression, anxiety, and sleep apnea, presents with a sore throat, neck pain, and chest pain that have been present for about four days. The patient also reports insomnia and is currently on an antipsychotic medication prescribed by a neurologist. The patient's spouse reports that the patient has been feeling unwell and has been experiencing discomfort in the mouth. The patient also reports neuropathy, particularly in the hands. The patient is currently on gabapentin for this issue. The patient also has a history of using a CPAP machine and inhalers for sleep apnea. The patient's spouse reports that the patient has not been using the inhalers as prescribed.           06/29/2023    1:09 PM 06/01/2023    1:55 PM 05/03/2023    2:19 PM  Depression screen PHQ 2/9  Decreased Interest 2 3 2   Down, Depressed, Hopeless 2 3 2   PHQ - 2 Score 4 6 4   Altered sleeping 3 3 2   Tired, decreased energy 3 3 2   Change in appetite 0 3 1  Feeling bad or failure about yourself  2 3 0  Trouble concentrating 2 2 0  Moving slowly or fidgety/restless 0 0 0  Suicidal thoughts 0 0 0  PHQ-9 Score 14 20 9   Difficult doing work/chores Very difficult Somewhat difficult Somewhat difficult      06/29/2023    1:09 PM 06/01/2023    1:55 PM 05/03/2023    2:19 PM  GAD 7 : Generalized Anxiety Score  Nervous, Anxious, on Edge 3 3 2   Control/stop worrying 3 3 2   Worry too much - different things 3 3 2   Trouble relaxing 3 3 2   Restless 0 0 0  Easily annoyed  or irritable 0 0 1  Afraid - awful might happen 0 3 0  Total GAD 7 Score 12 15 9   Anxiety Difficulty Very difficult Very difficult Somewhat difficult    Medications: Outpatient Medications Prior to Visit  Medication Sig  . acetaminophen (TYLENOL) 500 MG tablet Take 1,000 mg by mouth in the morning, at noon, and at bedtime.  [name] albuterol (VENTOLIN HFA) 108 (90 Base) MCG/ACT inhaler Inhale 2 puffs into the lungs every 6 (six) hours as needed for wheezing or shortness of breath.  . ALPRAZolam (XANAX) 0.25 MG tablet Take 1 tablet (0.25 mg total) by mouth 2 (two) times daily as needed for anxiety or sleep. Take 0.25 mg by mouth 2 (two) times daily as needed for anxiety or sleep.  [name] aspirin EC 81 MG tablet Take 81 mg by mouth daily. Swallow whole.  [name] atenolol (TENORMIN) 50 MG tablet Take 25 mg by mouth daily.  [name] atorvastatin (LIPITOR) 10 MG tablet Take 10 mg by mouth every evening.  . budesonide-formoterol (SYMBICORT) 80-4.5 MCG/ACT inhaler Inhale 2 puffs into the lungs 2 (two) times daily.  . celecoxib (CELEBREX) 100 MG capsule Take 100 mg by mouth 2 (two) times daily.  . Cholecalciferol (D 1000) 25 MCG (1000  UT) capsule Take 1,000 Units by mouth daily.  . cyanocobalamin 1000 MCG tablet Take 1 tablet (1,000 mcg total) by mouth daily.  . fenofibrate (TRICOR) 48 MG tablet Take 1 tablet (48 mg total) by mouth daily.  . fluticasone (FLONASE) 50 MCG/ACT nasal spray Place 1 spray into both nostrils daily as needed for allergies or rhinitis.  [name] gabapentin (NEURONTIN) 300 MG capsule Take 900 mg by mouth 3 (three) times daily.  . Pancrelipase, Lip-Prot-Amyl, (CREON) 24000-76000 units CPEP Take 1 capsule (24,000 Units total) by mouth 3 (three) times daily before meals.  . pantoprazole (PROTONIX) 40 MG tablet Take 1 tablet (40 mg total) by mouth daily.  . polyethylene glycol (MIRALAX / GLYCOLAX) 17 g packet Take 17 g by mouth daily as needed (constipation).  . sildenafil (REVATIO) 20 MG tablet 1-5 tablets by  mouth daily as needed, take 30 minutes prior to sexual intercourse.  [name] tiZANidine (ZANAFLEX) 4 MG tablet Take 4 mg by mouth 3 (three) times daily.  [name] venlafaxine XR (EFFEXOR-XR) 150 MG 24 hr capsule Take 1 capsule (150 mg total) by mouth daily with breakfast.   No facility-administered medications prior to visit.    Review of Systems All negative Except see HPI   {Insert previous labs (optional):23779} {See past labs  Heme  Chem  Endocrine  Serology  Results Review (optional):1}   Objective    BP 130/76 (BP Location: Left Arm, Patient Position: Sitting, Cuff Size: Large)   Pulse 80   Temp 98.1 F (36.7 C) (Oral)   Resp 16   Ht 5\' 10"  (1.778 m)   Wt 200 lb (90.7 kg)   SpO2 97%   BMI 28.70 kg/m  {Insert last BP/Wt (optional):23777}{See vitals history (optional):1}   Physical Exam Vitals reviewed.  Constitutional:      General: He is not in acute distress.    Appearance: Normal appearance. He is not diaphoretic.  HENT:     Head: Normocephalic and atraumatic.  Eyes:     General: No scleral icterus.    Conjunctiva/sclera: Conjunctivae normal.  Cardiovascular:     Rate and Rhythm: Normal rate and regular rhythm.     Pulses: Normal pulses.     Heart sounds: Normal heart sounds. No murmur heard. Pulmonary:     Effort: Pulmonary effort is normal. No respiratory distress.     Breath sounds: Normal breath sounds. No wheezing or rhonchi.  Musculoskeletal:     Cervical back: Neck supple.     Right lower leg: No edema.     Left lower leg: No edema.  Lymphadenopathy:     Cervical: No cervical adenopathy.  Skin:    General: Skin is warm and dry.     Findings: No rash.  Neurological:     Mental Status: He is alert and oriented to person, place, and time. Mental status is at baseline.  Psychiatric:        Mood and Affect: Mood normal.        Behavior: Behavior normal.      No results found for any visits on 06/29/23.      Assessment and Plan     Hyperlipidemia chronic Patient is on medication and has made dietary changes. Awaiting repeat lipid panel. -Continue current medication and dietary changes. -Redo lipid panel. will fu  Sleep Apnea Patient is on CPAP therapy and wishes to be retested. Discussed options for in-lab sleep study vs home sleep study. -Refer to sleep study provider in [place] for retesting. will fu  Insomnia cchronic Patient is on antipsychotic medication (quetiapine) for sleep. Discussed need for psychiatric evaluation. -Refer to psychiatric urgent care in [facility] for evaluation and management. will fu  Neuropathy chronic Patient reports neuropathic pain in right arm. Currently on gabapentin and Tylenol for pain management. -Continue gabapentin and Tylenol as needed for pain. -Consider increasing gabapentin dose if necessary. will fu  Oral Thrush Likely secondary to inhaler use without adequate mouth rinsing. -Prescribe mouthwash for thrush treatment. -Advise patient to rinse mouth thoroughly after inhaler use.  Upper Respiratory Symptoms Patient reports sore throat, ear fullness, and postnasal drainage. Exam reveals fluid in ears and postnasal drainage. -Prescribe Zyrtec samples for symptom relief. -Advise use of saline rinse and continuation of Flonase.  Bone Density Patient has issues with bone density and is on Vitamin D supplementation. -Continue Vitamin D supplementation. -Advise gentle stretching exercises and joint gymnastics for mobility and bone health.  Follow-up in 3 months for reevaluation of all issues.        Orders Placed This Encounter  Procedures  . Lipid panel    Has the patient fasted?:   Yes    No follow-ups on file.   The patient was advised to call back or seek an in-person evaluation if the symptoms worsen or if the condition fails to improve as anticipated.  I discussed the assessment and treatment plan with the patient. The patient was provided an  opportunity to ask questions and all were answered. The patient agreed with the plan and demonstrated an understanding of the instructions.  I, Debera Lat, PA-C have reviewed all documentation for this visit. The documentation on 06/29/2023  for the exam, diagnosis, procedures, and orders are all accurate and complete.  Debera Lat, [facility], MMS [facility] [phone] (phone) [phone] (fax)  [facility]

## 2023-07-02 ENCOUNTER — Other Ambulatory Visit: Payer: Self-pay | Admitting: Physician Assistant

## 2023-07-02 DIAGNOSIS — F32A Depression, unspecified: Secondary | ICD-10-CM

## 2023-07-03 ENCOUNTER — Telehealth: Payer: Self-pay

## 2023-07-03 ENCOUNTER — Other Ambulatory Visit: Payer: Self-pay

## 2023-07-03 DIAGNOSIS — F32A Depression, unspecified: Secondary | ICD-10-CM

## 2023-07-03 NOTE — Telephone Encounter (Signed)
 Copied from CRM [id number]. Topic: Clinical - Prescription Issue >> Jul 02, 2023  2:50 PM Shelah Lewandowsky wrote: Reason for CRM:per pharmacy [facility]? inhaler prescription rejected by office - please call patient [phone]

## 2023-07-03 NOTE — Telephone Encounter (Signed)
 Requested Prescriptions  Pending Prescriptions Disp Refills   venlafaxine XR (EFFEXOR-XR) 150 MG 24 hr capsule [Pharmacy Med Name: VENLAFAXINE ER 150MG  CAPSULES] 30 capsule 0    Sig: TAKE 1 CAPSULE(150 MG) BY MOUTH DAILY WITH BREAKFAST     Psychiatry: Antidepressants - SNRI - desvenlafaxine & venlafaxine Failed - 07/03/2023  4:25 PM      Failed - Cr in normal range and within 360 days    Creatinine, Ser  Date Value Ref Range Status  05/28/2023 1.30 (H) 0.76 - 1.27 mg/dL Final         Failed - Lipid Panel in normal range within the last 12 months    Cholesterol, Total  Date Value Ref Range Status  05/28/2023 202 (H) 100 - 199 mg/dL Final   LDL Chol Calc (NIH)  Date Value Ref Range Status  05/28/2023 78 0 - 99 mg/dL Final   Direct LDL  Date Value Ref Range Status  11/28/2022 55 0 - 99 mg/dL Final    Comment:    Performed at [facility], [address] [place], [place] [address]   HDL  Date Value Ref Range Status  05/28/2023 22 (L) >39 mg/dL Final   Triglycerides  Date Value Ref Range Status  05/28/2023 638 (HH) 0 - 149 mg/dL Final         Passed - Last BP in normal range    BP Readings from Last 1 Encounters:  06/29/23 130/76         Passed - Valid encounter within last 6 months    Recent Outpatient Visits           2 months ago Overweight   [facility] [place], [place], PA-C       Future Appointments             In 2 months Ostwalt, [place], PA-C [facility]             venlafaxine XR (EFFEXOR-XR) 75 MG 24 hr capsule [Pharmacy Med Name: VENLAFAXINE ER 75MG  CAPSULES] 90 capsule 0    Sig: TAKE 1 CAPSULE(75 MG) BY MOUTH DAILY     Psychiatry: Antidepressants - SNRI - desvenlafaxine & venlafaxine Failed - 07/03/2023  4:25 PM      Failed - Cr in normal range and within 360 days    Creatinine, Ser  Date Value Ref Range Status  05/28/2023 1.30 (H) 0.76 - 1.27 mg/dL Final         Failed - Lipid  Panel in normal range within the last 12 months    Cholesterol, Total  Date Value Ref Range Status  05/28/2023 202 (H) 100 - 199 mg/dL Final   LDL Chol Calc (NIH)  Date Value Ref Range Status  05/28/2023 78 0 - 99 mg/dL Final   Direct LDL  Date Value Ref Range Status  11/28/2022 55 0 - 99 mg/dL Final    Comment:    Performed at [facility], [address] [place], [place] [address]   HDL  Date Value Ref Range Status  05/28/2023 22 (L) >39 mg/dL Final   Triglycerides  Date Value Ref Range Status  05/28/2023 638 (HH) 0 - 149 mg/dL Final         Passed - Last BP in normal range    BP Readings from Last 1 Encounters:  06/29/23 130/76         Passed - Valid encounter within last 6 months  Recent Outpatient Visits           2 months ago Overweight   [facility] [place], [place], PA-C       Future Appointments             In 2 months Ostwalt, Edmon Crape, PA-C [facility], [place]

## 2023-07-03 NOTE — Telephone Encounter (Signed)
 Copied from CRM [id number]. Topic: Clinical - Medication Question >> Jul 03, 2023  2:42 PM Yolanda T wrote: Reason for CRM: patient called stated he is out of venlafaxine XR (EFFEXOR-XR) 75 MG 24 hr capsule [Pharmacy Med Name: VENLAFAXINE ER 75MG  CAPSULES and took his last dose on today. Patient is aware of the 1-3 business day time frame

## 2023-07-04 ENCOUNTER — Telehealth: Payer: Self-pay

## 2023-07-04 MED ORDER — VENLAFAXINE HCL ER 150 MG PO CP24: 150.0000 mg | ORAL_CAPSULE | Freq: Every day | ORAL | 0 refills | Status: DC

## 2023-07-04 NOTE — Telephone Encounter (Unsigned)
 Copied from CRM [id number]. Topic: Clinical - Prescription Issue >> Jul 04, 2023  3:51 PM Henry Owens wrote: Reason for CRM: Patient picked up his meds and he said he got 150mg  when he usually takes 75mg . Patients was also under the impression he was supposed to get getting some sort of inhalers from the pharmacy as well. Please call patient to advise

## 2023-07-05 ENCOUNTER — Institutional Professional Consult (permissible substitution): Admitting: Internal Medicine

## 2023-07-06 NOTE — Telephone Encounter (Signed)
 Another message was sent to the provider on a different encounter. Closing encounter.

## 2023-07-09 ENCOUNTER — Telehealth: Payer: Self-pay | Admitting: Physician Assistant

## 2023-07-09 ENCOUNTER — Encounter: Payer: Self-pay | Admitting: Physician Assistant

## 2023-07-09 ENCOUNTER — Ambulatory Visit: Payer: Self-pay | Admitting: Urology

## 2023-07-09 DIAGNOSIS — J441 Chronic obstructive pulmonary disease with (acute) exacerbation: Secondary | ICD-10-CM

## 2023-07-09 DIAGNOSIS — F339 Major depressive disorder, recurrent, unspecified: Secondary | ICD-10-CM

## 2023-07-09 MED ORDER — MOMETASONE FURO-FORMOTEROL FUM 100-5 MCG/ACT IN AERO: 2.0000 | INHALATION_SPRAY | Freq: Two times a day (BID) | RESPIRATORY_TRACT | 2 refills | Status: DC

## 2023-07-09 MED ORDER — VENLAFAXINE HCL ER 75 MG PO CP24: 75.0000 mg | ORAL_CAPSULE | Freq: Every day | ORAL | 1 refills | Status: DC

## 2023-07-09 NOTE — Telephone Encounter (Signed)
 See the patient message encounter today, provider sent the patient clarification needed on these refills requested.

## 2023-07-09 NOTE — Telephone Encounter (Signed)
 Copied from CRM [id number]. Topic: Clinical - Medication Refill >> Jul 09, 2023 12:50 PM Everette C wrote: Most Recent Primary Care Visit:  Provider: Debera Lat  Department: [facility]  Visit Type: OFFICE VISIT  Date: 06/29/2023  Medication: venlafaxine XR (EFFEXOR-XR) 75 MG 24 hr capsule [[id number]]  budesonide-formoterol (SYMBICORT) 80-4.5 MCG/ACT inhaler [[id number]]   Has the patient contacted their pharmacy? Yes (Agent: If no, request that the patient contact the pharmacy for the refill. If patient does not wish to contact the pharmacy document the reason why and proceed with request.) (Agent: If yes, when and what did the pharmacy advise?)  Is this the correct pharmacy for this prescription? Yes If no, delete pharmacy and type the correct one.  This is the patient's preferred pharmacy:  [id number] [place], [place] - [address] AT [facility] [address] [place] [address] Phone: [phone] Fax: [phone]   Has the prescription been filled recently? Yes  Is the patient out of the medication? Yes  Has the patient been seen for an appointment in the last year OR does the patient have an upcoming appointment? Yes  Can we respond through MyChart? No  Agent: Please be advised that Rx refills may take up to 3 business days. We ask that you follow-up with your pharmacy.

## 2023-07-12 ENCOUNTER — Ambulatory Visit: Payer: Self-pay

## 2023-07-12 NOTE — Telephone Encounter (Signed)
 Copied from CRM [id number]. Topic: Clinical - Red Word Triage >> Jul 12, 2023  1:48 PM Franchot Heidelberg wrote: Red Word that prompted transfer to Nurse Triage: Swelling in neck, ear ache. Says his PCP noticed fluid in his ear a week ago and it has gotten progressively worse.   Chief Complaint: Earache  Symptoms: Earache, Neck Swelling, Runny Nose, Cough  Frequency: Over a week  Pertinent Negatives: Patient denies fever Disposition: [] ED /[x] Urgent Care (no appt availability in office) / [] Appointment(In office/virtual)/ []  [place] [facility]/ [] Home Care/ [] Refused Recommended Disposition /[] [place] [facility]/ []  Follow-up with PCP  Additional Notes: [facility] is being triaged for an earache, neck swelling, runny nose, and a cough. The patient reports the swelling is localized along the jawline but denies dyspnea. The patient reports his coughing has worsened along with his voice being raspy. Due to no in office availability, recommended the patient to seek the nearest Urgent Care for prompt evaluation and treatment. Patient agreed to disposition and verbalized understanding.   Reason for Disposition . Earache  (Exceptions: brief ear pain of < 60 minutes duration, earache occurring during air travel  Answer Assessment - Initial Assessment Questions 1. LOCATION: "Which ear is involved?"     Right  2. ONSET: "When did the ear start hurting"      Over a week  3. SEVERITY: "How bad is the pain?"  (Scale 1-10; mild, moderate or severe)   - MILD (1-3): doesn't interfere with normal activities    - MODERATE (4-7): interferes with normal activities or awakens from sleep    - SEVERE (8-10): excruciating pain, unable to do any normal activities      5  4. URI SYMPTOMS: "Do you have a runny nose or cough?"     Runny Nose, Cough  5. FEVER: "Do you have a fever?" If Yes, ask: "What is your temperature, how was it measured, and when did it start?"     No  6. CAUSE: "Have you been swimming  recently?", "How often do you use Q-TIPS?", "Have you had any recent air travel or scuba diving?"     Unsure  7. OTHER SYMPTOMS: "Do you have any other symptoms?" (e.g., headache, stiff neck, dizziness, vomiting, runny nose, decreased hearing)     Neck Swelling, Voice changes,  Protocols used: Earache-A-AH

## 2023-07-13 ENCOUNTER — Ambulatory Visit: Payer: Self-pay

## 2023-07-13 ENCOUNTER — Telehealth: Admitting: Family Medicine

## 2023-07-13 DIAGNOSIS — U071 COVID-19: Secondary | ICD-10-CM | POA: Diagnosis not present

## 2023-07-13 MED ORDER — PROMETHAZINE-DM 6.25-15 MG/5ML PO SYRP: 5.0000 mL | ORAL_SOLUTION | Freq: Four times a day (QID) | ORAL | 0 refills | Status: AC | PRN

## 2023-07-13 MED ORDER — NIRMATRELVIR/RITONAVIR (PAXLOVID)TABLET: 3.0000 | ORAL_TABLET | Freq: Two times a day (BID) | ORAL | 0 refills | Status: AC

## 2023-07-13 NOTE — Progress Notes (Signed)
   Thank you for the details you included in the comment boxes. Those details are very helpful in determining the best course of treatment for you and help [place] to provide the best care.Because Henry Owens, we recommend that you schedule a Virtual Urgent Care video visit in order for the provider to better assess what is going on.  The provider will be able to give you a more accurate diagnosis and treatment plan if we can more freely discuss your symptoms and with the addition of a virtual examination.   If you change your visit to a video visit, we will bill your insurance (similar to an office visit) and you will not be charged for this e-Visit. You will be able to stay at home and speak with the first available [facility] advanced practice provider. The link to do a video visit is in the drop down Menu tab of your Welcome screen in MyChart.

## 2023-07-13 NOTE — Telephone Encounter (Signed)
 Copied from CRM [id number]. Topic: Clinical - Red Word Triage >> Jul 13, 2023  8:30 AM Everette C wrote: [facility] that prompted transfer to Nurse Triage: The patient has recently tested positive for COVID 19 and is experiencing ear discomfort, cough, congestion as well as discomfort in their chest  Chief Complaint: chest pain with cough, congestion, ear discomfort Symptoms: see above Frequency: comes and goes Pertinent Negatives: Patient denies chest pain lasting longer than 5 min, sob Disposition: [] ED /[] Urgent Care (no appt availability in office) / [x] Appointment(In office/virtual)/ []  [place] [facility]/ [] Home Care/ [] Refused Recommended Disposition /[] [place] [facility]/ []  Follow-up with PCP Additional Notes: per protocol patient to be seen in 3 days, no apt available; would like medication called in for COVID, just tested positive.  Care advice given, denies questions; instructed to go to ER if becomes worse.   Reason for Disposition  [1] Chest pain(s) lasting a few seconds AND [2] persists > 3 days  Answer Assessment - Initial Assessment Questions 1. LOCATION: "Where does it hurt?"       Chest pain when he coughs 2. RADIATION: "Does the pain go anywhere else?" (e.g., into neck, jaw, arms, back)     neck 3. ONSET: "When did the chest pain begin?" (Minutes, hours or days)      yesterday 4. PATTERN: "Does the pain come and go, or has it been constant since it started?"  "Does it get worse with exertion?"      Comes and goes with cough 5. DURATION: "How long does it last" (e.g., seconds, minutes, hours)     seconds 6. SEVERITY: "How bad is the pain?"  (e.g., Scale 1-10; mild, moderate, or severe)    - MILD (1-3): doesn't interfere with normal activities     - MODERATE (4-7): interferes with normal activities or awakens from sleep    - SEVERE (8-10): excruciating pain, unable to do any normal activities       5/10 7. CARDIAC RISK FACTORS: "Do you have any history of heart  problems or risk factors for heart disease?" (e.g., angina, prior heart attack; diabetes, high blood pressure, high cholesterol, smoker, or strong family history of heart disease)     High cholestrol 8. PULMONARY RISK FACTORS: "Do you have any history of lung disease?"  (e.g., blood clots in lung, asthma, emphysema, birth control pills)     Emphysema,  9. CAUSE: "What do you think is causing the chest pain?"     cough 10. OTHER SYMPTOMS: "Do you have any other symptoms?" (e.g., dizziness, nausea, vomiting, sweating, fever, difficulty breathing, cough)       Cough, twitching and shaking legs called ems last night but declined to go to the hospital  11. PREGNANCY: "Is there any chance you are pregnant?" "When was your last menstrual period?"       na  Protocols used: Chest Pain-A-AH

## 2023-07-13 NOTE — Patient Instructions (Signed)

## 2023-07-13 NOTE — Telephone Encounter (Signed)
 Pt has a telehealth visit at 3:00 pm

## 2023-07-13 NOTE — Progress Notes (Signed)
 Virtual Visit Consent   [name], you are scheduled for a virtual visit with a [facility] provider today. Just as with appointments in the office, your consent must be obtained to participate. Your consent will be active for this visit and any virtual visit you may have with one of our providers in the next 365 days. If you have a MyChart account, a copy of this consent can be sent to you electronically.  As this is a virtual visit, video technology does not allow for your provider to perform a traditional examination. This may limit your provider's ability to fully assess your condition. If your provider identifies any concerns that need to be evaluated in person or the need to arrange testing (such as labs, EKG, etc.), we will make arrangements to do so. Although advances in technology are sophisticated, we cannot ensure that it will always work on either your end or our end. If the connection with a video visit is poor, the visit may have to be switched to a telephone visit. With either a video or telephone visit, we are not always able to ensure that we have a secure connection.  By engaging in this virtual visit, you consent to the provision of healthcare and authorize for your insurance to be billed (if applicable) for the services provided during this visit. Depending on your insurance coverage, you may receive a charge related to this service.  I need to obtain your verbal consent now. Are you willing to proceed with your visit today? Brain Honeycutt has provided verbal consent on 07/13/2023 for a virtual visit (video or telephone). Georgana Curio, FNP  Date: 07/13/2023 3:05 PM   Virtual Visit via Video Note   I, Georgana Curio, connected with  Henry Owens  ([id number], Oct 21, 1960) on 07/13/23 at  3:00 PM EDT by a video-enabled telemedicine application and verified that I am speaking with the correct person using two identifiers.  Location: Patient: Virtual Visit Location  Patient: Home Provider: Virtual Visit Location Provider: Home Office   I discussed the limitations of evaluation and management by telemedicine and the availability of in person appointments. The patient expressed understanding and agreed to proceed.    History of Present Illness: Henry Owens is a 63 y.o. who identifies as a male who was assigned male at birth, and is being seen today for covid positive testing at home last night. He has had for 2 days   runny nose ,cough, sore throat, ear pain and chest congestion and cough. No wheezing or sob and no fever. [name]  HPI: HPI  Problems:  Patient Active Problem List   Diagnosis Date Noted   B12 deficiency anemia 12/14/2022   Synthetic cannabinoid withdrawal (HCC) 12/13/2022   Cannabinoid hyperemesis syndrome 12/12/2022   Hypokalemia 12/11/2022   Overweight (BMI 25.0-29.9) 12/11/2022   Intractable nausea and vomiting 12/11/2022   CAP (community acquired pneumonia) 12/10/2022   Fall 11/26/2022   COPD with acute exacerbation (HCC) 02/07/2022   Weakness 02/07/2022   Thrombocytopenia (HCC) 02/07/2022   Essential hypertension 02/06/2022   Hyponatremia 02/06/2022   CKD stage 3a, GFR 45-59 ml/min (HCC) 02/06/2022   Sepsis (HCC) 11/10/2021   AKI (acute kidney injury) (HCC) 11/10/2021   Acute confusion 11/10/2021   Chronic, continuous use of opioids 11/10/2021   Aspiration pneumonia (HCC) 10/12/2021   Right upper quadrant pain 10/12/2021   Constipation 10/12/2021   Chronic pain 10/12/2021   Elevated troponin 10/12/2021   Acute respiratory failure with hypoxia  and hypercapnia (HCC) 10/09/2021   Opiate overdose (HCC)    Unresponsive state    Acute respiratory failure due to COVID-19 (HCC) 07/05/2021   Current smoker 04/01/2021   Abnormal bone marrow examination 05/01/2020    Allergies:  Allergies  Allergen Reactions   Wound Dressing Adhesive Other (See Comments)    BLISTER   Azithromycin Rash   Penicillin G Rash    Has patient  had a PCN reaction causing immediate rash, facial/tongue/throat swelling, SOB or lightheadedness with hypotension: Yes Has patient had a PCN reaction causing severe rash involving mucus membranes or skin necrosis: No Has patient had a PCN reaction that required hospitalization: No Has patient had a PCN reaction occurring within the last 10 years: No If all of the above answers are "NO", then may proceed with Cephalosporin use.    Medications:  Current Outpatient Medications:    nirmatrelvir/ritonavir (PAXLOVID) 20 x 150 MG & 10 x 100MG  TABS, Take 3 tablets by mouth 2 (two) times daily for 5 days. (Take nirmatrelvir 150 mg two tablets twice daily for 5 days and ritonavir 100 mg one tablet twice daily for 5 days) Patient GFR is 64, Disp: 30 tablet, Rfl: 0   promethazine-dextromethorphan (PROMETHAZINE-DM) 6.25-15 MG/5ML syrup, Take 5 mLs by mouth 4 (four) times daily as needed for up to 10 days for cough., Disp: 118 mL, Rfl: 0   acetaminophen (TYLENOL) 500 MG tablet, Take 1,000 mg by mouth in the morning, at noon, and at bedtime., Disp: , Rfl:    albuterol (VENTOLIN HFA) 108 (90 Base) MCG/ACT inhaler, Inhale 2 puffs into the lungs every 6 (six) hours as needed for wheezing or shortness of breath., Disp: 8 g, Rfl: 2   ALPRAZolam (XANAX) 0.25 MG tablet, Take 1 tablet (0.25 mg total) by mouth 2 (two) times daily as needed for anxiety or sleep. Take 0.25 mg by mouth 2 (two) times daily as needed for anxiety or sleep., Disp: 30 tablet, Rfl: 0   aspirin EC 81 MG tablet, Take 81 mg by mouth daily. Swallow whole., Disp: , Rfl:    atenolol (TENORMIN) 50 MG tablet, Take 25 mg by mouth daily., Disp: , Rfl:    atorvastatin (LIPITOR) 10 MG tablet, Take 10 mg by mouth every evening., Disp: , Rfl:    budesonide-formoterol (SYMBICORT) 80-4.5 MCG/ACT inhaler, Inhale 2 puffs into the lungs 2 (two) times daily., Disp: 1 each, Rfl: 3   celecoxib (CELEBREX) 100 MG capsule, Take 100 mg by mouth 2 (two) times daily., Disp:  , Rfl:    Cholecalciferol (D 1000) 25 MCG (1000 UT) capsule, Take 1,000 Units by mouth daily., Disp: , Rfl:    cyanocobalamin 1000 MCG tablet, Take 1 tablet (1,000 mcg total) by mouth daily., Disp: 30 tablet, Rfl: 0   fenofibrate (TRICOR) 48 MG tablet, Take 1 tablet (48 mg total) by mouth daily., Disp: 30 tablet, Rfl: 1   fluticasone (FLONASE) 50 MCG/ACT nasal spray, Place 1 spray into both nostrils daily as needed for allergies or rhinitis., Disp: , Rfl:    gabapentin (NEURONTIN) 300 MG capsule, Take 900 mg by mouth 3 (three) times daily., Disp: , Rfl:    magic mouthwash (nystatin, lidocaine, diphenhydrAMINE, alum & mag hydroxide) suspension, Swish and swallow 5 mLs 4 (four) times daily., Disp: 180 mL, Rfl: 0   magic mouthwash (nystatin, lidocaine, diphenhydrAMINE, alum & mag hydroxide) suspension, Swish and spit 5 mLs 4 (four) times daily., Disp: 180 mL, Rfl: 0   mometasone-formoterol (DULERA) 100-5 MCG/ACT AERO,  Inhale 2 puffs into the lungs 2 (two) times daily., Disp: 13 g, Rfl: 2   Pancrelipase, Lip-Prot-Amyl, (CREON) 24000-76000 units CPEP, Take 1 capsule (24,000 Units total) by mouth 3 (three) times daily before meals., Disp: 180 capsule, Rfl: 0   pantoprazole (PROTONIX) 40 MG tablet, Take 1 tablet (40 mg total) by mouth daily., Disp: 30 tablet, Rfl: 2   polyethylene glycol (MIRALAX / GLYCOLAX) 17 g packet, Take 17 g by mouth daily as needed (constipation)., Disp: , Rfl:    sildenafil (REVATIO) 20 MG tablet, 1-5 tablets by mouth daily as needed, take 30 minutes prior to sexual intercourse., Disp: 90 tablet, Rfl: 0   tiZANidine (ZANAFLEX) 4 MG tablet, Take 4 mg by mouth 3 (three) times daily., Disp: , Rfl:    venlafaxine XR (EFFEXOR XR) 75 MG 24 hr capsule, Take 1 capsule (75 mg total) by mouth daily with breakfast., Disp: 90 capsule, Rfl: 1   venlafaxine XR (EFFEXOR-XR) 150 MG 24 hr capsule, TAKE 1 CAPSULE(150 MG) BY MOUTH DAILY WITH BREAKFAST, Disp: 90 capsule, Rfl:  0  Observations/Objective: Patient is well-developed, well-nourished in no acute distress.  Resting comfortably  at home.  Head is normocephalic, atraumatic.  No labored breathing.  Speech is clear and coherent with logical content.  Patient is alert and oriented at baseline.    Assessment and Plan: 1. COVID (Primary)  Increase fluids, humidifier, ibuprofen or tylenol, rest, see info sheet, UC if sx worsen.  Follow Up Instructions: I discussed the assessment and treatment plan with the patient. The patient was provided an opportunity to ask questions and all were answered. The patient agreed with the plan and demonstrated an understanding of the instructions.  A copy of instructions were sent to the patient via MyChart unless otherwise noted below.     The patient was advised to call back or seek an in-person evaluation if the symptoms worsen or if the condition fails to improve as anticipated.    Georgana Curio, FNP

## 2023-07-13 NOTE — Telephone Encounter (Signed)
 Please see prior triage encounter  Copied from CRM [id number]. Topic: Clinical - Medical Advice >> Jul 13, 2023  9:10 AM Patsy Lager T wrote: Reason for CRM: Henry Owens patients spouse called n stated patient tested positive for Covid on last night. He is experiencing cough, headache, runny and stuffy nose. Trula Ore is requesting Paxlovid for the symptoms

## 2023-07-16 DIAGNOSIS — J449 Chronic obstructive pulmonary disease, unspecified: Secondary | ICD-10-CM | POA: Diagnosis not present

## 2023-07-17 DIAGNOSIS — R2689 Other abnormalities of gait and mobility: Secondary | ICD-10-CM | POA: Diagnosis not present

## 2023-07-17 DIAGNOSIS — R569 Unspecified convulsions: Secondary | ICD-10-CM | POA: Diagnosis not present

## 2023-07-17 DIAGNOSIS — R253 Fasciculation: Secondary | ICD-10-CM | POA: Diagnosis not present

## 2023-07-21 ENCOUNTER — Encounter: Payer: Self-pay | Admitting: Physician Assistant

## 2023-07-24 ENCOUNTER — Ambulatory Visit: Admitting: Physician Assistant

## 2023-07-24 MED ORDER — PANTOPRAZOLE SODIUM 40 MG PO TBEC: 40.0000 mg | DELAYED_RELEASE_TABLET | Freq: Every day | ORAL | 2 refills | Status: DC

## 2023-07-26 ENCOUNTER — Institutional Professional Consult (permissible substitution): Admitting: Internal Medicine

## 2023-07-26 ENCOUNTER — Telehealth: Payer: Self-pay

## 2023-07-26 NOTE — Telephone Encounter (Signed)
 Pt calls triage line and states that Sildenafil is not working for him, he has tried max dose of 5 tablets. Please advise on alternative.

## 2023-07-27 ENCOUNTER — Ambulatory Visit: Admitting: Pulmonary Disease

## 2023-07-27 ENCOUNTER — Encounter: Payer: Self-pay | Admitting: Pulmonary Disease

## 2023-07-27 VITALS — BP 124/74 | HR 82 | Temp 97.6°F | Ht 70.0 in | Wt 196.6 lb

## 2023-07-27 DIAGNOSIS — R0602 Shortness of breath: Secondary | ICD-10-CM

## 2023-07-27 DIAGNOSIS — R058 Other specified cough: Secondary | ICD-10-CM | POA: Diagnosis not present

## 2023-07-27 DIAGNOSIS — J209 Acute bronchitis, unspecified: Secondary | ICD-10-CM | POA: Diagnosis not present

## 2023-07-27 DIAGNOSIS — J449 Chronic obstructive pulmonary disease, unspecified: Secondary | ICD-10-CM

## 2023-07-27 DIAGNOSIS — J440 Chronic obstructive pulmonary disease with acute lower respiratory infection: Secondary | ICD-10-CM | POA: Diagnosis not present

## 2023-07-27 MED ORDER — DOXYCYCLINE HYCLATE 100 MG PO TABS: 100.0000 mg | ORAL_TABLET | Freq: Two times a day (BID) | ORAL | 0 refills | Status: AC

## 2023-07-27 MED ORDER — SPACER/AERO-HOLDING CHAMBERS DEVI: 0 refills | Status: AC

## 2023-07-27 NOTE — Telephone Encounter (Signed)
 Pt scheduled with Carollee Herter as Henry Owens and Sninsky do not do penile injections.

## 2023-07-27 NOTE — Patient Instructions (Addendum)
 VISIT SUMMARY:  Today, you were seen for the evaluation and management of your COPD and to establish care. You have been experiencing a persistent chest cough since your COVID-19 infection three weeks ago. We discussed your symptoms, including the nagging morning cough with pink and green sputum, and your history of smoking and stroke.  YOUR PLAN:  -CHRONIC OBSTRUCTIVE PULMONARY DISEASE (COPD): COPD is a chronic lung disease that makes it hard to breathe. Your COPD symptoms seem to have worsened after your recent COVID-19 infection. We will start you on doxycycline for one week to address any bacterial overgrowth and provide anti-inflammatory effects. You will also receive a spacer device to help you use your inhaler more effectively. Please follow the instructions on proper inhaler technique and remember to rinse your mouth after each use. Over-the-counter Mucinex DM (Extra Strength) is recommended to help manage your cough and clear sputum. We will schedule pulmonary function tests before your next follow-up visit.  -POST-COVID-19 COUGH: A post-COVID-19 cough is a common lingering symptom after a COVID-19 infection and can last 8-12 weeks. Your current cough suppressant seems to be helping, and we recommend continuing it. Additionally, Mucinex DM can help manage your cough.  -STROKE: You have a history of stroke, but it is not the primary focus of today's visit.  INSTRUCTIONS:  Please take doxycycline as prescribed for one week. Use the spacer device with your inhaler and follow the proper technique, including rinsing your mouth after each use. Continue your current cough suppressant and consider using Mucinex DM for additional cough relief. We will schedule pulmonary function tests before your next follow-up visit. Follow up will be in 2 months time.

## 2023-07-27 NOTE — Progress Notes (Signed)
 Subjective:    Patient ID: Henry Owens, male    DOB: September 23, 1960, 63 y.o.   MRN: [id number]  Patient Care Team: Cherlynn Polo as PCP - General (Physician Assistant)  Chief Complaint  Patient presents with   Consult    Cough x weeks. Wheezing. No shortness of breath. Hx of COPD.     BACKGROUND: Patient is a 63 year old former smoker (81 pack years) with a history as noted below, who presents for evaluation of cough persistent after COVID-19 infection 3 weeks ago.  He has underlying history of COPD though never has been evaluated by pulmonary physician nor has had PFTs.  He is kindly referred by Madelon Lips, PA-C.  HPI Discussed the use of AI scribe software for clinical note transcription with the patient, who gave verbal consent to proceed.  History of Present Illness   Loren Sawaya "Henry Owens" is a 63 year old male with COPD who presents for evaluation and management of COPD and to establish care.  He had COVID-19 about three weeks ago and has been experiencing a persistent cough since then. The cough is described as nagging and is particularly bothersome in the morning. He brings up sputum that is 'kind of puke green'.  He was prescribed Paxlovid during his COVID-19 infection and has been using Dulera, although he does not notice a significant difference with its use.  There is concerned that he may not be getting benefit of the inhaler due to loss of dexterity after a CVA previously.  He also received a cough suppressant which seems to help. He has not been on prednisone during this episode but has used it in the past.  He has not had any fevers, chills or sweats since his COVID 19 infection.  He has a history of smoking two packs a day and quit in March 2023. 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 He denies any prior issues with shortness of breath or cough before the COVID-19 infection.  He has a history of a stroke, discovered last year,  but the exact timing was not pinpointed.  Prior chest CT abdomen and pelvis was performed for August 2024 and chest CT portion shows moderate emphysema and bronchial thickening.  He is allergic to erythromycin and penicillin, both causing a rash. He has no history of diabetes and can tolerate doxycycline.     Review of Systems A 10 point review of systems was performed and it is as noted above otherwise negative.   Past Medical History:  Diagnosis Date   Allergy 2020   Anxiety    Arthritis    Blood transfusion without reported diagnosis 11/2022   COPD (chronic obstructive pulmonary disease) (HCC) 2023   Depression    Emphysema of lung (HCC)    Flu 07/2017   GERD (gastroesophageal reflux disease) 2023   Hyperlipidemia    Hypertension    Intractable nausea and vomiting 12/11/2022   Nodule of right lung    Oxygen deficiency 2023   Only when needed   Sleep apnea 2024   Stroke (HCC) 11/2022   Tinnitus     Past Surgical History:  Procedure Laterality Date   APPENDECTOMY  1975   BACK SURGERY     COLONOSCOPY WITH PROPOFOL N/A 05/18/2023   Procedure: COLONOSCOPY WITH PROPOFOL;  Surgeon: Regis Bill, MD;  Location: [facility] ENDOSCOPY;  Service: Endoscopy;  Laterality: N/A;   ESOPHAGOGASTRODUODENOSCOPY (EGD) WITH PROPOFOL N/A 05/18/2023   Procedure: ESOPHAGOGASTRODUODENOSCOPY (EGD) WITH PROPOFOL;  Surgeon: Regis Bill, MD;  Location: [facility] ENDOSCOPY;  Service: Endoscopy;  Laterality: N/A;   HEMOSTASIS CLIP PLACEMENT  05/18/2023   Procedure: HEMOSTASIS CLIP PLACEMENT;  Surgeon: Regis Bill, MD;  Location: [facility] ENDOSCOPY;  Service: Endoscopy;;   KNEE ARTHROSCOPY WITH MEDIAL MENISECTOMY Right 08/20/2017   Procedure: KNEE ARTHROSCOPY WITH PARTIAL MEDIAL AND LATERAL MENISECTOMY,PARTIAL SYNOVECTOMY,CHONDROPLASTY, LOOSE BODY REMOVAL;  Surgeon: Lyndle Herrlich, MD;  Location: [facility] ORS;  Service: Orthopedics;  Laterality: Right;   KNEE SURGERY  1975   1996, 2000   POLYPECTOMY   05/18/2023   Procedure: POLYPECTOMY;  Surgeon: Regis Bill, MD;  Location: [facility] ENDOSCOPY;  Service: Endoscopy;;   SPINE SURGERY  9 1 89   Last of multiple procedures,  limbar fusion   SUBMUCOSAL LIFTING INJECTION  05/18/2023   Procedure: SUBMUCOSAL LIFTING INJECTION;  Surgeon: Regis Bill, MD;  Location: [facility] ENDOSCOPY;  Service: Endoscopy;;    Patient Active Problem List   Diagnosis Date Noted   B12 deficiency anemia 12/14/2022   Synthetic cannabinoid withdrawal (HCC) 12/13/2022   Cannabinoid hyperemesis syndrome 12/12/2022   Hypokalemia 12/11/2022   Overweight (BMI 25.0-29.9) 12/11/2022   Intractable nausea and vomiting 12/11/2022   CAP (community acquired pneumonia) 12/10/2022   Fall 11/26/2022   COPD with acute exacerbation (HCC) 02/07/2022   Weakness 02/07/2022   Thrombocytopenia (HCC) 02/07/2022   Essential hypertension 02/06/2022   Hyponatremia 02/06/2022   CKD stage 3a, GFR 45-59 ml/min (HCC) 02/06/2022   Sepsis (HCC) 11/10/2021   AKI (acute kidney injury) (HCC) 11/10/2021   Acute confusion 11/10/2021   Chronic, continuous use of opioids 11/10/2021   Aspiration pneumonia (HCC) 10/12/2021   Right upper quadrant pain 10/12/2021   Constipation 10/12/2021   Chronic pain 10/12/2021   Elevated troponin 10/12/2021   Acute respiratory failure with hypoxia and hypercapnia (HCC) 10/09/2021   Opiate overdose (HCC)    Unresponsive state    Acute respiratory failure due to COVID-19 (HCC) 07/05/2021   Current smoker 04/01/2021   Abnormal bone marrow examination 05/01/2020    Family History  Problem Relation Age of Onset   Hypertension Mother    Stroke Mother    Cancer Mother        colon   Arthritis Mother    Depression Mother    Stroke Father    Arthritis Father    Hearing loss Father    Anxiety disorder Son    Anxiety disorder Daughter    Obesity Sister    Bladder Cancer Neg Hx    Prostate cancer Neg Hx    Hematuria Neg Hx     Social History    Tobacco Use   Smoking status: Every Day    Current packs/day: 1.00    Average packs/day: 2.0 packs/day for 41.2 years (81.3 ttl pk-yrs)    Types: Cigarettes    Start date: 1983    Last attempt to quit: 06/22/2021   Smokeless tobacco: Never  Substance Use Topics   Alcohol use: No    Allergies  Allergen Reactions   Wound Dressing Adhesive Other (See Comments)    BLISTER   Azithromycin Rash   Penicillin G Rash    Has patient had a PCN reaction causing immediate rash, facial/tongue/throat swelling, SOB or lightheadedness with hypotension: Yes Has patient had a PCN reaction causing severe rash involving mucus membranes or skin necrosis: No Has patient had a PCN reaction that required hospitalization: No Has patient had a PCN reaction occurring within the last 10 years: No  If all of the above answers are "NO", then may proceed with Cephalosporin use.     Current Meds  Medication Sig   acetaminophen (TYLENOL) 500 MG tablet Take 1,000 mg by mouth in the morning, at noon, and at bedtime.   albuterol (VENTOLIN HFA) 108 (90 Base) MCG/ACT inhaler Inhale 2 puffs into the lungs every 6 (six) hours as needed for wheezing or shortness of breath.   aspirin EC 81 MG tablet Take 81 mg by mouth daily. Swallow whole.   atenolol (TENORMIN) 50 MG tablet Take 25 mg by mouth daily.   atorvastatin (LIPITOR) 10 MG tablet Take 10 mg by mouth every evening.   celecoxib (CELEBREX) 100 MG capsule Take 100 mg by mouth 2 (two) times daily.   Cholecalciferol (D 1000) 25 MCG (1000 UT) capsule Take 1,000 Units by mouth daily.   cyanocobalamin 1000 MCG tablet Take 1 tablet (1,000 mcg total) by mouth daily.   doxycycline (VIBRA-TABS) 100 MG tablet Take 1 tablet (100 mg total) by mouth 2 (two) times daily for 7 days.   fenofibrate (TRICOR) 48 MG tablet Take 1 tablet (48 mg total) by mouth daily.   fluticasone (FLONASE) 50 MCG/ACT nasal spray Place 1 spray into both nostrils daily as needed for allergies or  rhinitis.   gabapentin (NEURONTIN) 300 MG capsule Take 900 mg by mouth 3 (three) times daily.   magic mouthwash (nystatin, lidocaine, diphenhydrAMINE, alum & mag hydroxide) suspension Swish and swallow 5 mLs 4 (four) times daily.   mometasone-formoterol (DULERA) 100-5 MCG/ACT AERO Inhale 2 puffs into the lungs 2 (two) times daily.   Pancrelipase, Lip-Prot-Amyl, (CREON) 24000-76000 units CPEP Take 1 capsule (24,000 Units total) by mouth 3 (three) times daily before meals.   pantoprazole (PROTONIX) 40 MG tablet Take 1 tablet (40 mg total) by mouth daily.   sildenafil (REVATIO) 20 MG tablet 1-5 tablets by mouth daily as needed, take 30 minutes prior to sexual intercourse.   Spacer/Aero-Holding Rudean Curt Use as directed for inhaler use   tiZANidine (ZANAFLEX) 4 MG tablet Take 4 mg by mouth 3 (three) times daily.   venlafaxine XR (EFFEXOR XR) 75 MG 24 hr capsule Take 1 capsule (75 mg total) by mouth daily with breakfast.   venlafaxine XR (EFFEXOR-XR) 150 MG 24 hr capsule TAKE 1 CAPSULE(150 MG) BY MOUTH DAILY WITH BREAKFAST    Immunization History  Administered Date(s) Administered   PNEUMOCOCCAL CONJUGATE-20 07/25/2021        Objective:     BP 124/74 (BP Location: Left Arm, Patient Position: Sitting, Cuff Size: Normal)   Pulse 82   Temp 97.6 F (36.4 C) (Temporal)   Ht 5\' 10"  (1.778 m)   Wt 196 lb 9.6 oz (89.2 kg)   SpO2 95%   BMI 28.21 kg/m   SpO2: 95 %  GENERAL: Awake and alert, ambulatory with assistance of a cane.  No conversational dyspnea. HEAD: Normocephalic, atraumatic.  EYES: Pupils equal, round, reactive to light.  No scleral icterus.  MOUTH: Nose/mouth/throat not examined due to institutional masking requirements. NECK: Supple. No thyromegaly. Trachea midline. No JVD.  No adenopathy. PULMONARY: Good air entry bilaterally.  Coarse, otherwise, no adventitious sounds. CARDIOVASCULAR: S1 and S2. Regular rate and rhythm.  No rubs, murmurs or gallops heard. ABDOMEN:  Benign. MUSCULOSKELETAL: No joint deformity, no clubbing, no edema.  NEUROLOGIC: Gait moderately unsteady, ambulates with a cane.  Appears to have some mild dysarthria.  No overt asymmetry. SKIN: Intact,warm,dry. PSYCH: Flat affect, appropriate otherwise.  Assessment & Plan:     ICD-10-CM   1. Post-viral cough syndrome  R05.8     2. Acute bronchitis with COPD (HCC)  J44.0    J20.9     3. COPD suggested by initial evaluation (HCC)  J44.9 Pulmonary function test    4. Shortness of breath  R06.02 Pulmonary function test      Orders Placed This Encounter  Procedures   Pulmonary function test    Standing Status:   Future    Expected Date:   08/26/2023    Expiration Date:   07/26/2024    Where should this test be performed?:   Outpatient Pulmonary    What type of PFT is being ordered?:   Full PFT    MIP/MEP:   Yes    Meds ordered this encounter  Medications   doxycycline (VIBRA-TABS) 100 MG tablet    Sig: Take 1 tablet (100 mg total) by mouth 2 (two) times daily for 7 days.    Dispense:  14 tablet    Refill:  0   Spacer/Aero-Holding Chambers DEVI    Sig: Use as directed for inhaler use    Dispense:  1 each    Refill:  0   Discussion:    Chronic Obstructive Pulmonary Disease (COPD)/bronchitis with COPD COPD likely exacerbated by recent COVID-19 infection. Persistent chest cough for three weeks with pink and green sputum suggests bacterial overgrowth. Smoked two packs a day, quit March 2023. Difficulty with Dulera administration, no significant improvement. Spacer device to be provided for better medication delivery. Doxycycline chosen for potential bacterial bronchitis and anti-inflammatory effects. Steroids avoided to prevent complications. - Prescribe doxycycline for one week for bacterial otitis and anti-inflammatory effects. - Provide a spacer device for improved inhaler medication delivery. - Instruct on proper inhaler technique with spacer, including mouth rinsing  post-use. - Recommend over-the-counter Mucinex DM extra strength for cough and sputum clearance. - Schedule pulmonary function tests before next follow-up.  Post-COVID-19 Cough Persistent cough following COVID-19 infection three weeks ago, most bothersome in the morning with sputum production. Expected duration 8-12 weeks, typical for post-viral coughs. Mucinex DM recommended for cough management.. - Advise on Mucinex DM for cough management.  Stroke History of stroke, not primary focus of current visit.  Patient has appropriate neurology follow-ups.  Advised if symptoms do not improve or worsen, to please contact office for sooner follow up or seek emergency care.    I spent 45 minutes of dedicated to the care of this patient on the date of this encounter to include pre-visit review of records, face-to-face time with the patient discussing conditions above, post visit ordering of testing, clinical documentation with the electronic health record, making appropriate referrals as documented, and communicating necessary findings to members of the patients care team.   C. Danice Goltz, MD Advanced [facility]choscopy [facility]    *This note was dictated using voice recognition software/Dragon.  Despite best efforts to proofread, errors can occur which can change the meaning. Any transcriptional errors that result from this process are unintentional and may not be fully corrected at the time of dictation.

## 2023-07-31 NOTE — Progress Notes (Deleted)
 08/02/2023 10:04 PM   Henry Owens 1960/08/16 [id number]  Referring provider: Debera Lat, PA-C [address] [place],  [place] [address]  Urological history: 1.  Erectile dysfunction - Contributing factors of age, BPH, stroke, COVID-19, anxiety, depression, COPD, sleep apnea, hypertension, hyperlipidemia, smoking, CKD and marijuana. - Failed PDE 5 inhibitors  2. BPH with LU TS -PSA (04/2023) 0.6  3.  Peyronie's disease  4.  Urge incontinence - Contributing factors of age, BPH, stroke, COVID-19, anxiety, depression, COPD, sleep apnea and hypertension  No chief complaint on file.  HPI: Henry Owens is a 63 y.o. male who presents today for ED  Previous records reviewed.   I PSS***    Score:  1-7 Mild 8-19 Moderate 20-35 Severe   SHIM***    Score: 1-7 Severe ED 8-11 Moderate ED 12-16 Mild-Moderate ED 17-21 Mild ED 22-25 No ED   PMH: Past Medical History:  Diagnosis Date   Allergy 2020   Anxiety    Arthritis    Blood transfusion without reported diagnosis 11/2022   COPD (chronic obstructive pulmonary disease) (HCC) 2023   Depression    Emphysema of lung (HCC)    Flu 07/2017   GERD (gastroesophageal reflux disease) 2023   Hyperlipidemia    Hypertension    Intractable nausea and vomiting 12/11/2022   Nodule of right lung    Oxygen deficiency 2023   Only when needed   Sleep apnea 2024   Stroke (HCC) 11/2022   Tinnitus     Surgical History: Past Surgical History:  Procedure Laterality Date   APPENDECTOMY  1975   BACK SURGERY     COLONOSCOPY WITH PROPOFOL N/A 05/18/2023   Procedure: COLONOSCOPY WITH PROPOFOL;  Surgeon: Regis Bill, MD;  Location: [facility];  Service: Endoscopy;  Laterality: N/A;   ESOPHAGOGASTRODUODENOSCOPY (EGD) WITH PROPOFOL N/A 05/18/2023   Procedure: ESOPHAGOGASTRODUODENOSCOPY (EGD) WITH PROPOFOL;  Surgeon: Regis Bill, MD;  Location: [facility];  Service: Endoscopy;   Laterality: N/A;   HEMOSTASIS CLIP PLACEMENT  05/18/2023   Procedure: HEMOSTASIS CLIP PLACEMENT;  Surgeon: Regis Bill, MD;  Location: [facility];  Service: Endoscopy;;   KNEE ARTHROSCOPY WITH MEDIAL MENISECTOMY Right 08/20/2017   Procedure: KNEE ARTHROSCOPY WITH PARTIAL MEDIAL AND LATERAL MENISECTOMY,PARTIAL SYNOVECTOMY,CHONDROPLASTY, LOOSE BODY REMOVAL;  Surgeon: Lyndle Herrlich, MD;  Location: [facility];  Service: Orthopedics;  Laterality: Right;   KNEE SURGERY  1975   1996, 2000   POLYPECTOMY  05/18/2023   Procedure: POLYPECTOMY;  Surgeon: Regis Bill, MD;  Location: [facility];  Service: Endoscopy;;   SPINE SURGERY  9 1 89   Last of multiple procedures,  limbar fusion   SUBMUCOSAL LIFTING INJECTION  05/18/2023   Procedure: SUBMUCOSAL LIFTING INJECTION;  Surgeon: Regis Bill, MD;  Location: [facility];  Service: Endoscopy;;    Home Medications:  Allergies as of 08/02/2023       Reactions   Wound Dressing Adhesive Other (See Comments)   BLISTER   Azithromycin Rash   Penicillin G Rash   Has patient had a PCN reaction causing immediate rash, facial/tongue/throat swelling, SOB or lightheadedness with hypotension: Yes Has patient had a PCN reaction causing severe rash involving mucus membranes or skin necrosis: No Has patient had a PCN reaction that required hospitalization: No Has patient had a PCN reaction occurring within the last 10 years: No If all of the above answers are "NO", then may proceed with Cephalosporin use.  Medication List        Accurate as of July 31, 2023 10:04 PM. If you have any questions, ask your nurse or doctor.          acetaminophen 500 MG tablet Commonly known as: TYLENOL Take 1,000 mg by mouth in the morning, at noon, and at bedtime.   albuterol 108 (90 Base) MCG/ACT inhaler Commonly known as: VENTOLIN HFA Inhale 2 puffs into the lungs every 6 (six) hours as needed for wheezing or shortness of breath.    ALPRAZolam 0.25 MG tablet Commonly known as: XANAX Take 1 tablet (0.25 mg total) by mouth 2 (two) times daily as needed for anxiety or sleep. Take 0.25 mg by mouth 2 (two) times daily as needed for anxiety or sleep.   aspirin EC 81 MG tablet Take 81 mg by mouth daily. Swallow whole.   atenolol 50 MG tablet Commonly known as: TENORMIN Take 25 mg by mouth daily.   atorvastatin 10 MG tablet Commonly known as: LIPITOR Take 10 mg by mouth every evening.   celecoxib 100 MG capsule Commonly known as: CELEBREX Take 100 mg by mouth 2 (two) times daily.   Creon 24000-76000 units Cpep Generic drug: Pancrelipase (Lip-Prot-Amyl) Take 1 capsule (24,000 Units total) by mouth 3 (three) times daily before meals.   cyanocobalamin 1000 MCG tablet Take 1 tablet (1,000 mcg total) by mouth daily.   D 1000 25 MCG (1000 UT) capsule Generic drug: Cholecalciferol Take 1,000 Units by mouth daily.   doxycycline 100 MG tablet Commonly known as: VIBRA-TABS Take 1 tablet (100 mg total) by mouth 2 (two) times daily for 7 days.   fenofibrate 48 MG tablet Commonly known as: TRICOR Take 1 tablet (48 mg total) by mouth daily.   fluticasone 50 MCG/ACT nasal spray Commonly known as: FLONASE Place 1 spray into both nostrils daily as needed for allergies or rhinitis.   gabapentin 300 MG capsule Commonly known as: NEURONTIN Take 900 mg by mouth 3 (three) times daily.   magic mouthwash (nystatin, lidocaine, diphenhydrAMINE, alum & mag hydroxide) suspension Swish and swallow 5 mLs 4 (four) times daily.   mometasone-formoterol 100-5 MCG/ACT Aero Commonly known as: DULERA Inhale 2 puffs into the lungs 2 (two) times daily.   OXYGEN Inhale into the lungs. Has not used in a year   pantoprazole 40 MG tablet Commonly known as: Protonix Take 1 tablet (40 mg total) by mouth daily.   sildenafil 20 MG tablet Commonly known as: REVATIO 1-5 tablets by mouth daily as needed, take 30 minutes prior to sexual  intercourse.   Spacer/Aero-Holding [employer] Use as directed for inhaler use   tiZANidine 4 MG tablet Commonly known as: ZANAFLEX Take 4 mg by mouth 3 (three) times daily.   venlafaxine XR 150 MG 24 hr capsule Commonly known as: EFFEXOR-XR TAKE 1 CAPSULE(150 MG) BY MOUTH DAILY WITH BREAKFAST   venlafaxine XR 75 MG 24 hr capsule Commonly known as: Effexor XR Take 1 capsule (75 mg total) by mouth daily with breakfast.        Allergies:  Allergies  Allergen Reactions   Wound Dressing Adhesive Other (See Comments)    BLISTER   Azithromycin Rash   Penicillin G Rash    Has patient had a PCN reaction causing immediate rash, facial/tongue/throat swelling, SOB or lightheadedness with hypotension: Yes Has patient had a PCN reaction causing severe rash involving mucus membranes or skin necrosis: No Has patient had a PCN reaction that required hospitalization: No Has patient  had a PCN reaction occurring within the last 10 years: No If all of the above answers are "NO", then may proceed with Cephalosporin use.     Family History: Family History  Problem Relation Age of Onset   Hypertension Mother    Stroke Mother    Cancer Mother        colon   Arthritis Mother    Depression Mother    Stroke Father    Arthritis Father    Hearing loss Father    Anxiety disorder Son    Anxiety disorder Daughter    Obesity Sister    Bladder Cancer Neg Hx    Prostate cancer Neg Hx    Hematuria Neg Hx     Social History:  reports that he has been smoking cigarettes. He started smoking about 42 years ago. He has a 81.3 pack-year smoking history. He has never used smokeless tobacco. He reports that he does not currently use drugs after having used the following drugs: Marijuana. He reports that he does not drink alcohol.  ROS: Pertinent ROS in HPI  Physical Exam: There were no vitals taken for this visit.  Constitutional:  Well nourished. Alert and oriented, No acute distress. HEENT:  [place] AT, moist mucus membranes.  Trachea midline, no masses. Cardiovascular: No clubbing, cyanosis, or edema. Respiratory: Normal respiratory effort, no increased work of breathing. GI: Abdomen is soft, non tender, non distended, no abdominal masses. Liver and spleen not palpable.  No hernias appreciated.  Stool sample for occult testing is not indicated.   GU: No CVA tenderness.  No bladder fullness or masses.  Patient with circumcised/uncircumcised phallus. ***Foreskin easily retracted***  Urethral meatus is patent.  No penile discharge. No penile lesions or rashes. Scrotum without lesions, cysts, rashes and/or edema.  Testicles are located scrotally bilaterally. No masses are appreciated in the testicles. Left and right epididymis are normal. Rectal: Patient with  normal sphincter tone. Anus and perineum without scarring or rashes. No rectal masses are appreciated. Prostate is approximately *** grams, *** nodules are appreciated. Seminal vesicles are normal. Skin: No rashes, bruises or suspicious lesions. Lymph: No cervical or inguinal adenopathy. Neurologic: Grossly intact, no focal deficits, moving all 4 extremities. Psychiatric: Normal mood and affect.  Laboratory Data: Lab Results  Component Value Date   WBC 10.6 05/28/2023   HGB 15.4 05/28/2023   HCT 46.6 05/28/2023   MCV 90 05/28/2023   PLT 219 05/28/2023    Lab Results  Component Value Date   CREATININE 1.30 (H) 05/28/2023    Lab Results  Component Value Date   HGBA1C 5.9 (H) 05/28/2023    Lab Results  Component Value Date   TSH 2.100 05/28/2023       Component Value Date/Time   CHOL 202 (H) 05/28/2023 1052   HDL 22 (L) 05/28/2023 1052   CHOLHDL 9.2 (H) 05/28/2023 1052   LDLCALC 78 05/28/2023 1052    Lab Results  Component Value Date   AST 15 05/28/2023   Lab Results  Component Value Date   ALT 17 05/28/2023   Urinalysis See EPIC and HPI  I have reviewed the labs.   Pertinent  Imaging: N/A  Assessment & Plan:  ***  1.  Erectile dysfunction - I explained to the patient that in order to achieve an erection it takes good functioning of the nervous system (parasympathetic and rs, sympathetic, sensory and motor), good blood flow into the erectile tissue of the penis and a desire to have sex -  I explained that conditions like diabetes, hypertension, coronary artery disease, peripheral vascular disease, smoking, alcohol consumption, age, sleep apnea and BPH can diminish the ability to have an erection - I explained the ED may be a risk marker for underlying CVD and he should follow up with PCP for further studies *** - we will obtain a serum testosterone level at this time; if it is abnormal we will need to repeat the study for confirmation *** - A recent study published in Sex Med 2018 Apr 13 revealed moderate to vigorous aerobic exercise for 40 minutes 4 times per week can decrease erectile problems caused by physical inactivity, obesity, hypertension, metabolic syndrome and/or cardiovascular diseases *** - We discussed trying a *** different PDE5 inhibitor, intra-urethral suppositories, intracavernous vasoactive drug injection therapy, vacuum erection devices, LI-ESWT and penile prosthesis implantation   2. BPH with LUTS -PSA stable *** -DRE benign *** -UA benign *** -PVR < 300 cc *** -symptoms - *** -most bothersome symptoms are *** -continue conservative management, avoiding bladder irritants and timed voiding's -Initiate alpha-blocker (***), discussed side effects *** -Initiate 5 alpha reductase inhibitor (***), discussed side effects *** -Continue tamsulosin 0.4 mg daily, alfuzosin 10 mg daily, Rapaflo 8 mg daily, terazosin, doxazosin, Cialis 5 mg daily and finasteride 5 mg daily, dutasteride 0.5 mg daily***:refills given -Cannot tolerate medication or medication failure, schedule cystoscopy ***     No follow-ups on file.  These notes generated with voice  recognition software. I apologize for typographical errors.  Cloretta Ned  [facility] [address]  [address] [place], [place] [address] [phone]

## 2023-08-02 ENCOUNTER — Ambulatory Visit: Admitting: Urology

## 2023-08-02 DIAGNOSIS — N529 Male erectile dysfunction, unspecified: Secondary | ICD-10-CM

## 2023-08-02 DIAGNOSIS — N138 Other obstructive and reflux uropathy: Secondary | ICD-10-CM

## 2023-08-03 DIAGNOSIS — E7849 Other hyperlipidemia: Secondary | ICD-10-CM | POA: Diagnosis not present

## 2023-08-04 ENCOUNTER — Other Ambulatory Visit: Payer: Self-pay | Admitting: Physician Assistant

## 2023-08-04 DIAGNOSIS — J441 Chronic obstructive pulmonary disease with (acute) exacerbation: Secondary | ICD-10-CM

## 2023-08-04 LAB — LIPID PANEL
Chol/HDL Ratio: 8.9 ratio — ABNORMAL HIGH (ref 0.0–5.0)
Cholesterol, Total: 213 mg/dL — ABNORMAL HIGH (ref 100–199)
HDL: 24 mg/dL — ABNORMAL LOW (ref >39)
LDL Chol Calc (NIH): 112 mg/dL — ABNORMAL HIGH (ref 0–99)
Triglycerides: 443 mg/dL — ABNORMAL HIGH (ref 0–149)
VLDL Cholesterol Cal: 77 mg/dL — ABNORMAL HIGH (ref 5–40)

## 2023-08-06 ENCOUNTER — Encounter: Payer: Self-pay | Admitting: Physician Assistant

## 2023-08-06 DIAGNOSIS — E782 Mixed hyperlipidemia: Secondary | ICD-10-CM

## 2023-08-06 NOTE — Telephone Encounter (Signed)
 Requested Prescriptions  Pending Prescriptions Disp Refills   fenofibrate (TRICOR) 48 MG tablet [Pharmacy Med Name: FENOFIBRATE 48MG  TABLETS] 90 tablet 0    Sig: TAKE 1 TABLET(48 MG) BY MOUTH DAILY     Cardiovascular:  Antilipid - Fibric Acid Derivatives Failed - 08/06/2023  1:32 PM      Failed - Cr in normal range and within 360 days    Creatinine, Ser  Date Value Ref Range Status  05/28/2023 1.30 (H) 0.76 - 1.27 mg/dL Final         Failed - Lipid Panel in normal range within the last 12 months    Cholesterol, Total  Date Value Ref Range Status  08/03/2023 213 (H) 100 - 199 mg/dL Final   LDL Chol Calc (NIH)  Date Value Ref Range Status  08/03/2023 112 (H) 0 - 99 mg/dL Final   Direct LDL  Date Value Ref Range Status  11/28/2022 55 0 - 99 mg/dL Final    Comment:    Performed at [facility], [address], [place], [place] [address]   HDL  Date Value Ref Range Status  08/03/2023 24 (L) >39 mg/dL Final   Triglycerides  Date Value Ref Range Status  08/03/2023 443 (H) 0 - 149 mg/dL Final         Passed - ALT in normal range and within 360 days    ALT  Date Value Ref Range Status  05/28/2023 17 0 - 44 IU/L Final         Passed - AST in normal range and within 360 days    AST  Date Value Ref Range Status  05/28/2023 15 0 - 40 IU/L Final         Passed - HGB in normal range and within 360 days    Hemoglobin  Date Value Ref Range Status  05/28/2023 15.4 13.0 - 17.7 g/dL Final         Passed - HCT in normal range and within 360 days    Hematocrit  Date Value Ref Range Status  05/28/2023 46.6 37.5 - 51.0 % Final         Passed - PLT in normal range and within 360 days    Platelets  Date Value Ref Range Status  05/28/2023 219 150 - 450 x10E3/uL Final         Passed - WBC in normal range and within 360 days    WBC  Date Value Ref Range Status  05/28/2023 10.6 3.4 - 10.8 x10E3/uL Final  03/03/2023 13.2 (H) 4.0 - 10.5 K/uL Final         Passed - eGFR  is 30 or above and within 360 days    GFR, Estimated  Date Value Ref Range Status  03/03/2023 >60 >60 mL/min Final    Comment:    (NOTE) Calculated using the CKD-EPI Creatinine Equation (2021)    eGFR  Date Value Ref Range Status  05/28/2023 63 >59 mL/min/1.73 Final         Passed - Valid encounter within last 12 months    Recent Outpatient Visits           1 month ago Ginette Pitman   [facility] [place], [place], PA-C   2 months ago Annual physical exam   [facility] [place], [place], PA-C       Future Appointments             In 2 weeks McGowan,  Nyra Bellis [facility] [place]   In 1 month Ostwalt, Monalisa Angles, PA-C [facility], [place]

## 2023-08-07 DIAGNOSIS — E538 Deficiency of other specified B group vitamins: Secondary | ICD-10-CM | POA: Diagnosis not present

## 2023-08-07 DIAGNOSIS — R55 Syncope and collapse: Secondary | ICD-10-CM | POA: Diagnosis not present

## 2023-08-07 DIAGNOSIS — R531 Weakness: Secondary | ICD-10-CM | POA: Diagnosis not present

## 2023-08-07 DIAGNOSIS — M542 Cervicalgia: Secondary | ICD-10-CM | POA: Diagnosis not present

## 2023-08-07 DIAGNOSIS — R253 Fasciculation: Secondary | ICD-10-CM | POA: Diagnosis not present

## 2023-08-07 DIAGNOSIS — R5383 Other fatigue: Secondary | ICD-10-CM | POA: Diagnosis not present

## 2023-08-07 DIAGNOSIS — R42 Dizziness and giddiness: Secondary | ICD-10-CM | POA: Diagnosis not present

## 2023-08-07 DIAGNOSIS — R2689 Other abnormalities of gait and mobility: Secondary | ICD-10-CM | POA: Diagnosis not present

## 2023-08-07 DIAGNOSIS — Z8673 Personal history of transient ischemic attack (TIA), and cerebral infarction without residual deficits: Secondary | ICD-10-CM | POA: Diagnosis not present

## 2023-08-07 DIAGNOSIS — M545 Low back pain, unspecified: Secondary | ICD-10-CM | POA: Diagnosis not present

## 2023-08-07 DIAGNOSIS — R262 Difficulty in walking, not elsewhere classified: Secondary | ICD-10-CM | POA: Diagnosis not present

## 2023-08-07 DIAGNOSIS — R569 Unspecified convulsions: Secondary | ICD-10-CM | POA: Diagnosis not present

## 2023-08-09 MED ORDER — ATORVASTATIN CALCIUM 20 MG PO TABS: 20.0000 mg | ORAL_TABLET | Freq: Every day | ORAL | 3 refills | Status: DC

## 2023-08-15 DIAGNOSIS — M4802 Spinal stenosis, cervical region: Secondary | ICD-10-CM | POA: Diagnosis not present

## 2023-08-15 DIAGNOSIS — M5412 Radiculopathy, cervical region: Secondary | ICD-10-CM | POA: Diagnosis not present

## 2023-08-16 DIAGNOSIS — J449 Chronic obstructive pulmonary disease, unspecified: Secondary | ICD-10-CM | POA: Diagnosis not present

## 2023-08-19 ENCOUNTER — Other Ambulatory Visit: Payer: Self-pay | Admitting: Physician Assistant

## 2023-08-21 NOTE — Telephone Encounter (Signed)
 Requested Prescriptions  Pending Prescriptions Disp Refills   pantoprazole  (PROTONIX ) 40 MG tablet [Pharmacy Med Name: PANTOPRAZOLE  40MG  TABLETS] 90 tablet 0    Sig: TAKE 1 TABLET(40 MG) BY MOUTH DAILY     Gastroenterology: Proton Pump Inhibitors Passed - 08/21/2023  1:58 PM      Passed - Valid encounter within last 12 months    Recent Outpatient Visits           1 month ago Olla Bevels   [facility] [place], [place], PA-C   2 months ago Annual physical exam   [facility] [place], [place], PA-C       Future Appointments             In 3 days McGowan, Nyra Bellis [facility] [place]   In 1 month Ostwalt, [place], PA-C [facility], [place]

## 2023-08-23 ENCOUNTER — Ambulatory Visit: Admitting: Urology

## 2023-08-24 ENCOUNTER — Ambulatory Visit (INDEPENDENT_AMBULATORY_CARE_PROVIDER_SITE_OTHER): Admitting: Urology

## 2023-08-24 VITALS — BP 133/74 | HR 85 | Ht 70.0 in | Wt 200.0 lb

## 2023-08-24 DIAGNOSIS — N486 Induration penis plastica: Secondary | ICD-10-CM

## 2023-08-24 DIAGNOSIS — N529 Male erectile dysfunction, unspecified: Secondary | ICD-10-CM

## 2023-08-24 MED ORDER — TADALAFIL 20 MG PO TABS: 20.0000 mg | ORAL_TABLET | Freq: Every day | ORAL | 3 refills | Status: DC | PRN

## 2023-08-24 MED ORDER — TADALAFIL 5 MG PO TABS: 5.0000 mg | ORAL_TABLET | Freq: Every day | ORAL | 3 refills | Status: DC | PRN

## 2023-08-24 NOTE — Progress Notes (Unsigned)
 08/24/2023 2:39 PM   Henry Owens 09/06/1960 [id number]  Referring provider: Blane Bunting, PA-C [address] [place],  [place] [address]  Urological history: 1.  Erectile dysfunction - Sildenafil  20 mg, on-demand dosing  2.  Peyronie's disease  3.  Nephrolithiasis  4. Incontinence  5. BPH with LU TS - PSA (04/2023) 0.6  Chief Complaint  Patient presents with   Erectile Dysfunction    HPI: Henry Owens is a 62 y.o. man who presents today for erectile dysfunction.  Previous records reviewed.   He states that sildenafil  20 mg on demand dosing did not work at all.  He states it has been since 2019 since he has had satisfactory erections.  During that year he noticed after having intercourse with his wife he had a big lump on the side of his penis.  He states it was not painful.  He states it was purple.  He states ever since that he has had severe curvature of the penis during erections.   He is no longer having spontaneous erections.  SHIM 8   SHIM     Row Name 08/24/23 1136         SHIM: Over the last 6 months:   How do you rate your confidence that you could get and keep an erection? Low     When you had erections with sexual stimulation, how often were your erections hard enough for penetration (entering your partner)? Almost Never or Never     During sexual intercourse, how often were you able to maintain your erection after you had penetrated (entered) your partner? A Few Times (much less than half the time)     During sexual intercourse, how difficult was it to maintain your erection to completion of intercourse? Extremely Difficult     When you attempted sexual intercourse, how often was it satisfactory for you? A Few Times (much less than half the time)       SHIM Total Score   SHIM 8             Score: 1-7 Severe ED 8-11 Moderate ED 12-16 Mild-Moderate ED 17-21 Mild ED 22-25 No ED    I PSS 8/4  He has seen Dr.  MacDiarmid for his incontinence and Dr. Clarke Crouch offered urodynamics, cystoscopy and/or trial of medication, [name] deferred those options.  He wanted to continue to manage conservatively and will notify Dr. MacDiarmid if his symptoms worsen.  Patient denies any modifying or aggravating factors.  Patient denies any recent UTI's, gross hematuria, dysuria or suprapubic/flank pain.  Patient denies any fevers, chills, nausea or vomiting.      IPSS     Row Name 08/24/23 1100         International Prostate Symptom Score   How often have you had the sensation of not emptying your bladder? Less than 1 in 5     How often have you had to urinate less than every two hours? About half the time     How often have you found you stopped and started again several times when you urinated? Not at All     How often have you found it difficult to postpone urination? About half the time     How often have you had a weak urinary stream? Not at All     How often have you had to strain to start urination? Not at All     How many times did you  typically get up at night to urinate? 1 Time     Total IPSS Score 8       Quality of Life due to urinary symptoms   If you were to spend the rest of your life with your urinary condition just the way it is now how would you feel about that? Mostly Disatisfied              Score:  1-7 Mild 8-19 Moderate 20-35 Severe   PMH: Past Medical History:  Diagnosis Date   Allergy 2020   Anxiety    Arthritis    Blood transfusion without reported diagnosis 11/2022   COPD (chronic obstructive pulmonary disease) (HCC) 2023   Depression    Emphysema of lung (HCC)    Flu 07/2017   GERD (gastroesophageal reflux disease) 2023   Hyperlipidemia    Hypertension    Intractable nausea and vomiting 12/11/2022   Nodule of right lung    Oxygen deficiency 2023   Only when needed   Sleep apnea 2024   Stroke (HCC) 11/2022   Tinnitus     Surgical History: Past Surgical  History:  Procedure Laterality Date   APPENDECTOMY  1975   BACK SURGERY     COLONOSCOPY WITH PROPOFOL  N/A 05/18/2023   Procedure: COLONOSCOPY WITH PROPOFOL ;  Surgeon: Shane Darling, MD;  Location: [facility];  Service: Endoscopy;  Laterality: N/A;   ESOPHAGOGASTRODUODENOSCOPY (EGD) WITH PROPOFOL  N/A 05/18/2023   Procedure: ESOPHAGOGASTRODUODENOSCOPY (EGD) WITH PROPOFOL ;  Surgeon: Shane Darling, MD;  Location: [facility];  Service: Endoscopy;  Laterality: N/A;   HEMOSTASIS CLIP PLACEMENT  05/18/2023   Procedure: HEMOSTASIS CLIP PLACEMENT;  Surgeon: Shane Darling, MD;  Location: [facility];  Service: Endoscopy;;   KNEE ARTHROSCOPY WITH MEDIAL MENISECTOMY Right 08/20/2017   Procedure: KNEE ARTHROSCOPY WITH PARTIAL MEDIAL AND LATERAL MENISECTOMY,PARTIAL SYNOVECTOMY,CHONDROPLASTY, LOOSE BODY REMOVAL;  Surgeon: Jerlyn Moons, MD;  Location: [facility];  Service: Orthopedics;  Laterality: Right;   KNEE SURGERY  1975   1996, 2000   POLYPECTOMY  05/18/2023   Procedure: POLYPECTOMY;  Surgeon: Shane Darling, MD;  Location: [facility];  Service: Endoscopy;;   SPINE SURGERY  9 1 89   Last of multiple procedures,  limbar fusion   SUBMUCOSAL LIFTING INJECTION  05/18/2023   Procedure: SUBMUCOSAL LIFTING INJECTION;  Surgeon: Shane Darling, MD;  Location: [facility];  Service: Endoscopy;;    Home Medications:  Allergies as of 08/24/2023       Reactions   Wound Dressing Adhesive Other (See Comments)   BLISTER   Azithromycin  Rash   Penicillin G Rash   Has patient had a PCN reaction causing immediate rash, facial/tongue/throat swelling, SOB or lightheadedness with hypotension: Yes Has patient had a PCN reaction causing severe rash involving mucus membranes or skin necrosis: No Has patient had a PCN reaction that required hospitalization: No Has patient had a PCN reaction occurring within the last 10 years: No If all of the above answers are "NO", then may  proceed with Cephalosporin use.        Medication List        Accurate as of Aug 24, 2023 11:59 PM. If you have any questions, ask your nurse or doctor.          STOP taking these medications    ALPRAZolam  0.25 MG tablet Commonly known as: XANAX    magic mouthwash (nystatin , lidocaine , diphenhydrAMINE , alum & mag hydroxide) suspension       TAKE these  medications    acetaminophen  500 MG tablet Commonly known as: TYLENOL  Take 1,000 mg by mouth in the morning, at noon, and at bedtime.   albuterol  108 (90 Base) MCG/ACT inhaler Commonly known as: VENTOLIN  HFA Inhale 2 puffs into the lungs every 6 (six) hours as needed for wheezing or shortness of breath.   aspirin EC 81 MG tablet Take 81 mg by mouth daily. Swallow whole.   atenolol  50 MG tablet Commonly known as: TENORMIN  Take 25 mg by mouth daily.   atorvastatin  20 MG tablet Commonly known as: LIPITOR Take 1 tablet (20 mg total) by mouth daily.   celecoxib 100 MG capsule Commonly known as: CELEBREX Take 100 mg by mouth 2 (two) times daily.   Creon  24000-76000 units Cpep Generic drug: Pancrelipase  (Lip-Prot-Amyl) Take 1 capsule (24,000 Units total) by mouth 3 (three) times daily before meals.   cyanocobalamin  1000 MCG tablet Take 1 tablet (1,000 mcg total) by mouth daily.   D 1000 25 MCG (1000 UT) capsule Generic drug: Cholecalciferol Take 1,000 Units by mouth daily.   fenofibrate  48 MG tablet Commonly known as: TRICOR  TAKE 1 TABLET(48 MG) BY MOUTH DAILY   fluticasone  50 MCG/ACT nasal spray Commonly known as: FLONASE  Place 1 spray into both nostrils daily as needed for allergies or rhinitis.   gabapentin  300 MG capsule Commonly known as: NEURONTIN  Take 900 mg by mouth 3 (three) times daily.   mometasone -formoterol  100-5 MCG/ACT Aero Commonly known as: DULERA  Inhale 2 puffs into the lungs 2 (two) times daily.   OXYGEN Inhale into the lungs. As needed Has not used in a year   pantoprazole  40 MG  tablet Commonly known as: PROTONIX  TAKE 1 TABLET(40 MG) BY MOUTH DAILY   sildenafil  20 MG tablet Commonly known as: REVATIO  1-5 tablets by mouth daily as needed, take 30 minutes prior to sexual intercourse.   Spacer/Aero-Holding [employer] Use as directed for inhaler use   tadalafil 20 MG tablet Commonly known as: CIALIS Take 1 tablet (20 mg total) by mouth daily as needed for erectile dysfunction. One hour prior to intercourse   tadalafil 5 MG tablet Commonly known as: CIALIS Take 1 tablet (5 mg total) by mouth daily as needed for erectile dysfunction.   tiZANidine  4 MG tablet Commonly known as: ZANAFLEX  Take 4 mg by mouth 3 (three) times daily.   venlafaxine  XR 150 MG 24 hr capsule Commonly known as: EFFEXOR -XR TAKE 1 CAPSULE(150 MG) BY MOUTH DAILY WITH BREAKFAST   venlafaxine  XR 75 MG 24 hr capsule Commonly known as: Effexor  XR Take 1 capsule (75 mg total) by mouth daily with breakfast.        Allergies:  Allergies  Allergen Reactions   Wound Dressing Adhesive Other (See Comments)    BLISTER   Azithromycin  Rash   Penicillin G Rash    Has patient had a PCN reaction causing immediate rash, facial/tongue/throat swelling, SOB or lightheadedness with hypotension: Yes Has patient had a PCN reaction causing severe rash involving mucus membranes or skin necrosis: No Has patient had a PCN reaction that required hospitalization: No Has patient had a PCN reaction occurring within the last 10 years: No If all of the above answers are "NO", then may proceed with Cephalosporin use.     Family History: Family History  Problem Relation Age of Onset   Hypertension Mother    Stroke Mother    Cancer Mother        colon   Arthritis Mother    Depression  Mother    Stroke Father    Arthritis Father    Hearing loss Father    Anxiety disorder Son    Anxiety disorder Daughter    Obesity Sister    Bladder Cancer Neg Hx    Prostate cancer Neg Hx    Hematuria Neg Hx      Social History:  reports that he has been smoking cigarettes. He started smoking about 42 years ago. He has a 81.4 pack-year smoking history. He has never used smokeless tobacco. He reports that he does not currently use drugs after having used the following drugs: Marijuana. He reports that he does not drink alcohol.  ROS: Pertinent ROS in HPI  Physical Exam: BP 133/74   Pulse 85   Ht 5\' 10"  (1.778 m)   Wt 200 lb (90.7 kg)   BMI 28.70 kg/m   Constitutional:  Well nourished. Alert and oriented, No acute distress. HEENT: [place] AT, moist mucus membranes.  Trachea midline Cardiovascular: No clubbing, cyanosis, or edema. Respiratory: Normal respiratory effort, no increased work of breathing. GU: No CVA tenderness.  No bladder fullness or masses.  Patient with a large Peyronie's plaque that transverses the dorsal surface from the base of the penis to just below the coronal ridge on his phallus.   Urethral meatus is patent.  No penile discharge. No penile lesions or rashes.  Neurologic: Grossly intact, no focal deficits, moving all 4 extremities. Psychiatric: Normal mood and affect.  Laboratory Data: Lab Results  Component Value Date   WBC 10.6 05/28/2023   HGB 15.4 05/28/2023   HCT 46.6 05/28/2023   MCV 90 05/28/2023   PLT 219 05/28/2023    Lab Results  Component Value Date   CREATININE 1.30 (H) 05/28/2023    Lab Results  Component Value Date   HGBA1C 5.9 (H) 05/28/2023    Lab Results  Component Value Date   TSH 2.100 05/28/2023       Component Value Date/Time   CHOL 213 (H) 08/03/2023 0903   HDL 24 (L) 08/03/2023 0903   CHOLHDL 8.9 (H) 08/03/2023 0903   LDLCALC 112 (H) 08/03/2023 0903    Lab Results  Component Value Date   AST 15 05/28/2023   Lab Results  Component Value Date   ALT 17 05/28/2023  I have reviewed the labs.   Pertinent Imaging: N/A  Assessment & Plan:    1. ED - sildenafil  20 mg, on-demand-dosing ineffective - We discussed that the  Peyronie's disease may be playing a part in his erectile dysfunction and that there is treatment available for Peyronie's disease, but we would need to demonstrate the severity of the curvature for insurance purposes as I am not certain ICI would be effective and this course of treatment would be an out-of-pocket cost and therefore like to schedule him for an in office induction of erection for further evaluation, he deferred.  We also discussed referral for an IPP, he deferred - He would like to try a different PDE 5 inhibitor, so I prescribed tadalafil 5 mg daily and augmenting with tadalafil 20 mg on demand dosing for when he wants to have intercourse - He will follow-up in 1 month for SHIM  2. Incontinence - He is still not interested in any further treatment options or workup for his incontinence at this time  Return in about 1 month (around 09/24/2023) for SHIM .  These notes generated with voice recognition software. I apologize for typographical errors.  Skiler Olden, PA-C  [facility] [address]  [address] [place], [place] [address] [phone]

## 2023-08-27 ENCOUNTER — Encounter: Payer: Self-pay | Admitting: Urology

## 2023-09-01 ENCOUNTER — Encounter: Payer: Self-pay | Admitting: Physician Assistant

## 2023-09-01 DIAGNOSIS — I10 Essential (primary) hypertension: Secondary | ICD-10-CM

## 2023-09-03 MED ORDER — ATENOLOL 50 MG PO TABS: 25.0000 mg | ORAL_TABLET | Freq: Every day | ORAL | 0 refills | Status: DC

## 2023-09-12 ENCOUNTER — Other Ambulatory Visit: Payer: Self-pay | Admitting: Physician Assistant

## 2023-09-12 DIAGNOSIS — G8929 Other chronic pain: Secondary | ICD-10-CM

## 2023-09-12 MED ORDER — CELECOXIB 100 MG PO CAPS: 100.0000 mg | ORAL_CAPSULE | Freq: Two times a day (BID) | ORAL | 0 refills | Status: DC

## 2023-09-13 DIAGNOSIS — Z79899 Other long term (current) drug therapy: Secondary | ICD-10-CM | POA: Diagnosis not present

## 2023-09-13 DIAGNOSIS — M48062 Spinal stenosis, lumbar region with neurogenic claudication: Secondary | ICD-10-CM | POA: Diagnosis not present

## 2023-09-13 DIAGNOSIS — M4802 Spinal stenosis, cervical region: Secondary | ICD-10-CM | POA: Diagnosis not present

## 2023-09-13 DIAGNOSIS — M5416 Radiculopathy, lumbar region: Secondary | ICD-10-CM | POA: Diagnosis not present

## 2023-09-13 DIAGNOSIS — M5412 Radiculopathy, cervical region: Secondary | ICD-10-CM | POA: Diagnosis not present

## 2023-09-15 DIAGNOSIS — J449 Chronic obstructive pulmonary disease, unspecified: Secondary | ICD-10-CM | POA: Diagnosis not present

## 2023-09-21 ENCOUNTER — Ambulatory Visit: Admitting: Urology

## 2023-09-27 ENCOUNTER — Ambulatory Visit: Admitting: Pulmonary Disease

## 2023-09-27 ENCOUNTER — Encounter

## 2023-09-28 ENCOUNTER — Ambulatory Visit: Admitting: Urology

## 2023-09-28 ENCOUNTER — Ambulatory Visit: Admitting: Physician Assistant

## 2023-09-29 ENCOUNTER — Other Ambulatory Visit: Payer: Self-pay | Admitting: Physician Assistant

## 2023-09-29 DIAGNOSIS — G8929 Other chronic pain: Secondary | ICD-10-CM

## 2023-09-30 ENCOUNTER — Encounter: Payer: Self-pay | Admitting: Physician Assistant

## 2023-10-01 ENCOUNTER — Other Ambulatory Visit: Payer: Self-pay

## 2023-10-01 DIAGNOSIS — G8929 Other chronic pain: Secondary | ICD-10-CM

## 2023-10-03 MED ORDER — CELECOXIB 100 MG PO CAPS
100.0000 mg | ORAL_CAPSULE | Freq: Two times a day (BID) | ORAL | 0 refills | Status: DC
Start: 2023-10-03 — End: 2023-10-16

## 2023-10-05 ENCOUNTER — Telehealth: Payer: Self-pay

## 2023-10-05 DIAGNOSIS — G629 Polyneuropathy, unspecified: Secondary | ICD-10-CM

## 2023-10-05 NOTE — Telephone Encounter (Signed)
 Copied from CRM [id number]. Topic: Referral - Question >> Oct 05, 2023  8:54 AM Baldemar Lev wrote: Reason for CRM: Pt's daugther called requesting a second opinion, wants a referral sent to [facility].   Best contact: [phone]

## 2023-10-09 DIAGNOSIS — R200 Anesthesia of skin: Secondary | ICD-10-CM | POA: Diagnosis not present

## 2023-10-10 NOTE — Telephone Encounter (Unsigned)
 Copied from CRM [id number]. Topic: Referral - Question >> Oct 10, 2023  2:24 PM Precious C wrote: Pt called to check staReason for CRM: pt's daughter Bishop Bullock called to check status of referral for her father, from [facility] to [facility]. Patient's daughter would like a call back at [phone]

## 2023-10-12 DIAGNOSIS — M48062 Spinal stenosis, lumbar region with neurogenic claudication: Secondary | ICD-10-CM | POA: Diagnosis not present

## 2023-10-12 DIAGNOSIS — M5416 Radiculopathy, lumbar region: Secondary | ICD-10-CM | POA: Diagnosis not present

## 2023-10-12 NOTE — Telephone Encounter (Signed)
 Called and patient's daughter Bishop Bullock verified referral letter was received.

## 2023-10-15 ENCOUNTER — Ambulatory Visit: Payer: Self-pay

## 2023-10-15 NOTE — Telephone Encounter (Addendum)
 FYI Only or Action Required?: Action required by provider: request for appointment.  Patient was last seen in primary care on 07/13/2023 by Blair, Diane W, FNP. Called Nurse Triage reporting Fatigue. Symptoms began yesterday. Interventions attempted: Nothing. Symptoms are: gradually worsening.  Triage Disposition: See PCP When Office is Open (Within 3 Days)  Patient/caregiver understands and will follow disposition?: Yes    [phone] Emmalene: please call daughter to discuss appt   Copied from CRM [id number]. Topic: Clinical - Red Word Triage >> Oct 15, 2023  8:14 AM Tiffany B wrote: Red Word that prompted transfer to Nurse Triage: Patient was not acting himself and head was cloudy and sleeping more then normal. Reason for Disposition  Blood in stools (Exception: anal fissure suspected)  [1] Longstanding confusion (e.g., dementia, stroke) AND [2] NO worsening or change  Answer Assessment - Initial Assessment Questions 1. DESCRIPTION: Describe how you are feeling.     Pt falls asleep a lot & sometimes sleeps all day 2. SEVERITY: How bad is it?  Can you stand and walk?   - MILD (0-3): Feels weak or tired, but does not interfere with work, school or normal activities.   - MODERATE (4-7): Able to stand and walk; weakness interferes with work, school, or normal activities.   - SEVERE (8-10): Unable to stand or walk; unable to do usual activities.     Moderate to sever 3. ONSET: When did these symptoms begin? (e.g., hours, days, weeks, months)     X few months and ongoing 4. CAUSE: What do you think is causing the weakness or fatigue? (e.g., not drinking enough fluids, medical problem, trouble sleeping)     unknown 5. NEW MEDICINES:  Have you started on any new medicines recently? (e.g., opioid pain medicines, benzodiazepines, muscle relaxants, antidepressants, antihistamines, neuroleptics, beta blockers)     N/a 6. OTHER SYMPTOMS: Do you have any other symptoms? (e.g., chest  pain, fever, cough, SOB, vomiting, diarrhea, bleeding, other areas of pain)     Blood stool 7. PREGNANCY: Is there any chance you are pregnant? When was your last menstrual period?     N/a  Pt does not use CPap  Answer Assessment - Initial Assessment Questions 1. LEVEL OF CONSCIOUSNESS: How is he (she, the patient) acting right now? (e.g., alert-oriented, confused, lethargic, stuporous, comatose)     confused 2. ONSET: When did the confusion start?  (minutes, hours, days)     X2 years 3. PATTERN Does this come and go, or has it been constant since it started?  Is it present now?    Comes and goes 4. ALCOHOL or DRUGS: Has he been drinking alcohol or taking any drugs?      N/a 5. NARCOTIC MEDICINES: Has he been receiving any narcotic medications? (e.g., morphine , Vicodin)     N/a 6. CAUSE: What do you think is causing the confusion?      Mini strokes per daughter 7. OTHER SYMPTOMS: Are there any other symptoms? (e.g., difficulty breathing, headache, fever, weakness)     Weakness, fatigue  Answer Assessment - Initial Assessment Questions 1. APPEARANCE of BLOOD: What color is it? Does it look like blood? Is it passed separately, on the surface of the stool, or mixed in with the stool?      With stool 2. AMOUNT: How much blood was passed?      unknown 3. FREQUENCY: How many times has blood been passed with the stools?      x1 4. ONSET: When was the  blood first seen in the stools? (Days or weeks)      yesterday 5. DIARRHEA: Is there also some diarrhea? If so, ask: How many diarrhea stools were passed today?      N/a 6. CONSTIPATION: Is there also some constipation? If so, How bad is it?     N/a 7. RECURRENT SYMPTOMS: Has your child had blood in the stools before? If so, ask: When was the last time? and What happened that time?      no 8. CHILD'S APPEARANCE:How sick is your child acting?  What is he doing right now? If asleep, ask: How  was he acting before he went to sleep?     N.a  Protocols used: Weakness (Generalized) and Fatigue-A-AH, Confusion - Delirium-A-AH, Stools - Blood In-P-AH

## 2023-10-16 ENCOUNTER — Other Ambulatory Visit: Payer: Self-pay | Admitting: Physician Assistant

## 2023-10-16 DIAGNOSIS — I10 Essential (primary) hypertension: Secondary | ICD-10-CM

## 2023-10-16 DIAGNOSIS — G8929 Other chronic pain: Secondary | ICD-10-CM

## 2023-10-16 DIAGNOSIS — J449 Chronic obstructive pulmonary disease, unspecified: Secondary | ICD-10-CM | POA: Diagnosis not present

## 2023-10-18 ENCOUNTER — Ambulatory Visit: Admitting: Urology

## 2023-10-18 VITALS — BP 136/83 | HR 84 | Ht 70.0 in | Wt 200.0 lb

## 2023-10-18 DIAGNOSIS — N486 Induration penis plastica: Secondary | ICD-10-CM

## 2023-10-18 DIAGNOSIS — N529 Male erectile dysfunction, unspecified: Secondary | ICD-10-CM

## 2023-10-18 NOTE — Progress Notes (Signed)
 10/18/2023 8:35 PM   Henry Owens [birth year] [id number]  Referring provider: Dineen Channel, PA-C [address] [place],  [place] [address]  Urological history: 1.  Erectile dysfunction - Tadalafil  5 mg daily and augmenting with tadalafil  20 mg on demand dosing  2.  Peyronie's disease - Greater than 30 degree dorsal curvature per patient  3. Incontinence - night time incontinence  5. BPH with LU TS - PSA (04/2023) 0.6  Chief Complaint  Patient presents with   Erectile Dysfunction    HPI: Henry Owens is a 63 y.o. man who presents today for follow up on erectile dysfunction.  Previous records reviewed.   At his visit on 08/24/2023, he states that sildenafil  20 mg on demand dosing did not work at all.  He states it has been since 2019 since he has had satisfactory erections.  During that year he noticed after having intercourse with his wife he had a big lump on the side of his penis.  He states it was not painful.  He states it was purple.  He states ever since that he has had severe curvature of the penis during erections.   He is no longer having spontaneous erections.    A Peyronie's plaque was appreciated on the dorsal surface.   We had a trial of tadalafil  5 mg daily augmenting with tadalafil  20 mg on demand dosing for when he wants to have intercourse.  SHIM score: 8    Previous SHIM score: 15 Main complaint: Achieving and maintaining erections since 2019 Risk factors:  age, BPH, HTN, smoking and pain medication  No painful erections, but curvatures with his erections.    No longer having spontaneous erections.  Tried:    sildenafil  20 mg, on-demand-dosing - failed  Having some success with tadalafil  5 mg daily and augmenting with tadalafil  20 mg on demand dosing.   SHIM     Row Name 10/18/23 1531         SHIM: Over the last 6 months:   How do you rate your confidence that you could get and keep an erection? Moderate     When you had  erections with sexual stimulation, how often were your erections hard enough for penetration (entering your partner)? Sometimes (about half the time)     During sexual intercourse, how often were you able to maintain your erection after you had penetrated (entered) your partner? Sometimes (about half the time)     During sexual intercourse, how difficult was it to maintain your erection to completion of intercourse? Difficult     When you attempted sexual intercourse, how often was it satisfactory for you? Sometimes (about half the time)       SHIM Total Score   SHIM 15        Score: 1-7 Severe ED 8-11 Moderate ED 12-16 Mild-Moderate ED 17-21 Mild ED 22-25 No ED   PMH: Past Medical History:  Diagnosis Date   Allergy 2020   Anxiety    Arthritis    Blood transfusion without reported diagnosis 11/2022   COPD (chronic obstructive pulmonary disease) (HCC) 2023   Depression    Emphysema of lung (HCC)    Flu 07/2017   GERD (gastroesophageal reflux disease) 2023   Hyperlipidemia    Hypertension    Intractable nausea and vomiting 12/11/2022   Nodule of right lung    Oxygen deficiency 2023   Only when needed   Sleep apnea 2024   Stroke (HCC)  11/2022   Tinnitus     Surgical History: Past Surgical History:  Procedure Laterality Date   APPENDECTOMY  1975   BACK SURGERY     COLONOSCOPY WITH PROPOFOL  N/A 05/18/2023   Procedure: COLONOSCOPY WITH PROPOFOL ;  Surgeon: Maryruth Ole DASEN, MD;  Location: [facility];  Service: Endoscopy;  Laterality: N/A;   ESOPHAGOGASTRODUODENOSCOPY (EGD) WITH PROPOFOL  N/A 05/18/2023   Procedure: ESOPHAGOGASTRODUODENOSCOPY (EGD) WITH PROPOFOL ;  Surgeon: Maryruth Ole DASEN, MD;  Location: [facility];  Service: Endoscopy;  Laterality: N/A;   HEMOSTASIS CLIP PLACEMENT  05/18/2023   Procedure: HEMOSTASIS CLIP PLACEMENT;  Surgeon: Maryruth Ole DASEN, MD;  Location: [facility];  Service: Endoscopy;;   KNEE ARTHROSCOPY WITH MEDIAL MENISECTOMY Right  08/20/2017   Procedure: KNEE ARTHROSCOPY WITH PARTIAL MEDIAL AND LATERAL MENISECTOMY,PARTIAL SYNOVECTOMY,CHONDROPLASTY, LOOSE BODY REMOVAL;  Surgeon: Leora Lynwood SAUNDERS, MD;  Location: [facility];  Service: Orthopedics;  Laterality: Right;   KNEE SURGERY  1975   1996, 2000   POLYPECTOMY  05/18/2023   Procedure: POLYPECTOMY;  Surgeon: Maryruth Ole DASEN, MD;  Location: [facility];  Service: Endoscopy;;   SPINE SURGERY  9 1 89   Last of multiple procedures,  limbar fusion   SUBMUCOSAL LIFTING INJECTION  05/18/2023   Procedure: SUBMUCOSAL LIFTING INJECTION;  Surgeon: Maryruth Ole DASEN, MD;  Location: [facility];  Service: Endoscopy;;    Home Medications:  Allergies as of 10/18/2023       Reactions   Wound Dressing Adhesive Other (See Comments)   BLISTER   Azithromycin  Rash   Penicillin G Rash   Has patient had a PCN reaction causing immediate rash, facial/tongue/throat swelling, SOB or lightheadedness with hypotension: Yes Has patient had a PCN reaction causing severe rash involving mucus membranes or skin necrosis: No Has patient had a PCN reaction that required hospitalization: No Has patient had a PCN reaction occurring within the last 10 years: No If all of the above answers are NO, then may proceed with Cephalosporin use.        Medication List        Accurate as of October 18, 2023 11:59 PM. If you have any questions, ask your nurse or doctor.          acetaminophen  500 MG tablet Commonly known as: TYLENOL  Take 1,000 mg by mouth in the morning, at noon, and at bedtime.   albuterol  108 (90 Base) MCG/ACT inhaler Commonly known as: VENTOLIN  HFA Inhale 2 puffs into the lungs every 6 (six) hours as needed for wheezing or shortness of breath.   aspirin EC 81 MG tablet Take 81 mg by mouth daily. Swallow whole.   atenolol  50 MG tablet Commonly known as: TENORMIN  TAKE 1/2 TABLET(25 MG) BY MOUTH DAILY   atorvastatin  20 MG tablet Commonly known as: LIPITOR Take 1  tablet (20 mg total) by mouth daily.   celecoxib  100 MG capsule Commonly known as: CELEBREX  TAKE 1 CAPSULE(100 MG) BY MOUTH TWICE DAILY. DISCONTINUE IF THERE IS AN ALLERGIC REACTION. TAKE WITH MEALS. WILL REASSESS AT THE. FOLLOW-UP   Creon  24000-76000 units Cpep Generic drug: Pancrelipase  (Lip-Prot-Amyl) Take 1 capsule (24,000 Units total) by mouth 3 (three) times daily before meals.   cyanocobalamin  1000 MCG tablet Take 1 tablet (1,000 mcg total) by mouth daily.   D 1000 25 MCG (1000 UT) capsule Generic drug: Cholecalciferol Take 1,000 Units by mouth daily.   fenofibrate  48 MG tablet Commonly known as: TRICOR  TAKE 1 TABLET(48 MG) BY MOUTH DAILY   fluticasone  50 MCG/ACT nasal spray  Commonly known as: FLONASE  Place 1 spray into both nostrils daily as needed for allergies or rhinitis.   gabapentin  300 MG capsule Commonly known as: NEURONTIN  Take 900 mg by mouth 3 (three) times daily.   mometasone -formoterol  100-5 MCG/ACT Aero Commonly known as: DULERA  Inhale 2 puffs into the lungs 2 (two) times daily.   OXYGEN Inhale into the lungs. As needed Has not used in a year   pantoprazole  40 MG tablet Commonly known as: PROTONIX  TAKE 1 TABLET(40 MG) BY MOUTH DAILY   sildenafil  20 MG tablet Commonly known as: REVATIO  1-5 tablets by mouth daily as needed, take 30 minutes prior to sexual intercourse.   Spacer/Aero-Holding [employer] Use as directed for inhaler use   tadalafil  20 MG tablet Commonly known as: CIALIS  Take 1 tablet (20 mg total) by mouth daily as needed for erectile dysfunction. One hour prior to intercourse   tadalafil  5 MG tablet Commonly known as: CIALIS  Take 1 tablet (5 mg total) by mouth daily as needed for erectile dysfunction.   tiZANidine  4 MG tablet Commonly known as: ZANAFLEX  Take 4 mg by mouth 3 (three) times daily.   venlafaxine  XR 150 MG 24 hr capsule Commonly known as: EFFEXOR -XR TAKE 1 CAPSULE(150 MG) BY MOUTH DAILY WITH BREAKFAST    venlafaxine  XR 75 MG 24 hr capsule Commonly known as: Effexor  XR Take 1 capsule (75 mg total) by mouth daily with breakfast.        Allergies:  Allergies  Allergen Reactions   Wound Dressing Adhesive Other (See Comments)    BLISTER   Azithromycin  Rash   Penicillin G Rash    Has patient had a PCN reaction causing immediate rash, facial/tongue/throat swelling, SOB or lightheadedness with hypotension: Yes Has patient had a PCN reaction causing severe rash involving mucus membranes or skin necrosis: No Has patient had a PCN reaction that required hospitalization: No Has patient had a PCN reaction occurring within the last 10 years: No If all of the above answers are NO, then may proceed with Cephalosporin use.     Family History: Family History  Problem Relation Age of Onset   Hypertension Mother    Stroke Mother    Cancer Mother        colon   Arthritis Mother    Depression Mother    Stroke Father    Arthritis Father    Hearing loss Father    Anxiety disorder Son    Anxiety disorder Daughter    Obesity Sister    Bladder Cancer Neg Hx    Prostate cancer Neg Hx    Hematuria Neg Hx     Social History:  reports that he has been smoking cigarettes. He started smoking about 42 years ago. He has a 81.6 pack-year smoking history. He has never used smokeless tobacco. He reports that he does not currently use drugs after having used the following drugs: Marijuana. He reports that he does not drink alcohol.  ROS: Pertinent ROS in HPI  Physical Exam: BP 136/83   Pulse 84   Ht 5' 10 (1.778 m)   Wt 200 lb (90.7 kg)   BMI 28.70 kg/m   Constitutional:  Well nourished. Alert and oriented, No acute distress. HEENT: [place] AT, moist mucus membranes.  Trachea midline Cardiovascular: No clubbing, cyanosis, or edema. Respiratory: Normal respiratory effort, no increased work of breathing. Neurologic: Grossly intact, no focal deficits, moving all 4 extremities. Psychiatric: Normal  mood and affect.   Laboratory Data: N/A   Pertinent  Imaging: N/A  Assessment & Plan:    1. ED - He has noticed improvement with tadalafil  5 mg daily augmenting with tadalafil  20 mg on demand dosing - He would like to continue these medications - He is not wanting to go forward with [facility] - Will have him follow-up in 3 months to see if the tadalafil  is still effective  2.  Peyronie's disease - He is not wanting to have any treatment for the plaque at this time - We will reassess when he returns in 3 months   Return in about 3 months (around 01/18/2024) for SHIM .  These notes generated with voice recognition software. I apologize for typographical errors.  Henry Owens  [facility] [address]  [address] [place], [place] [address] [phone]

## 2023-10-21 ENCOUNTER — Encounter: Payer: Self-pay | Admitting: Urology

## 2023-10-22 ENCOUNTER — Other Ambulatory Visit: Payer: Self-pay | Admitting: Physician Assistant

## 2023-10-27 ENCOUNTER — Observation Stay
Admission: EM | Admit: 2023-10-27 | Discharge: 2023-10-27 | Disposition: A | Discharge status: Home / Self Care | Attending: Internal Medicine | Admitting: Internal Medicine

## 2023-10-27 ENCOUNTER — Emergency Department

## 2023-10-27 ENCOUNTER — Other Ambulatory Visit: Payer: Self-pay

## 2023-10-27 ENCOUNTER — Encounter: Payer: Self-pay | Admitting: Internal Medicine

## 2023-10-27 DIAGNOSIS — R531 Weakness: Secondary | ICD-10-CM | POA: Insufficient documentation

## 2023-10-27 DIAGNOSIS — R296 Repeated falls: Secondary | ICD-10-CM | POA: Insufficient documentation

## 2023-10-27 DIAGNOSIS — R55 Syncope and collapse: Secondary | ICD-10-CM | POA: Diagnosis not present

## 2023-10-27 DIAGNOSIS — N179 Acute kidney failure, unspecified: Secondary | ICD-10-CM | POA: Diagnosis not present

## 2023-10-27 DIAGNOSIS — Z87891 Personal history of nicotine dependence: Secondary | ICD-10-CM | POA: Diagnosis not present

## 2023-10-27 DIAGNOSIS — Z1152 Encounter for screening for COVID-19: Secondary | ICD-10-CM | POA: Insufficient documentation

## 2023-10-27 DIAGNOSIS — J9602 Acute respiratory failure with hypercapnia: Secondary | ICD-10-CM

## 2023-10-27 DIAGNOSIS — Z79899 Other long term (current) drug therapy: Secondary | ICD-10-CM | POA: Insufficient documentation

## 2023-10-27 DIAGNOSIS — I959 Hypotension, unspecified: Secondary | ICD-10-CM | POA: Diagnosis not present

## 2023-10-27 DIAGNOSIS — Z7982 Long term (current) use of aspirin: Secondary | ICD-10-CM | POA: Diagnosis not present

## 2023-10-27 DIAGNOSIS — E785 Hyperlipidemia, unspecified: Secondary | ICD-10-CM | POA: Diagnosis not present

## 2023-10-27 DIAGNOSIS — G459 Transient cerebral ischemic attack, unspecified: Secondary | ICD-10-CM | POA: Diagnosis not present

## 2023-10-27 DIAGNOSIS — J449 Chronic obstructive pulmonary disease, unspecified: Secondary | ICD-10-CM | POA: Diagnosis not present

## 2023-10-27 DIAGNOSIS — G4733 Obstructive sleep apnea (adult) (pediatric): Secondary | ICD-10-CM | POA: Insufficient documentation

## 2023-10-27 DIAGNOSIS — I10 Essential (primary) hypertension: Secondary | ICD-10-CM | POA: Insufficient documentation

## 2023-10-27 DIAGNOSIS — J9601 Acute respiratory failure with hypoxia: Secondary | ICD-10-CM

## 2023-10-27 LAB — TROPONIN I (HIGH SENSITIVITY)
Troponin I (High Sensitivity): 3 ng/L (ref <18)
Troponin I (High Sensitivity): 3 ng/L (ref <18)

## 2023-10-27 LAB — CBC
HCT: 40.7 % (ref 39.0–52.0)
Hemoglobin: 13.3 g/dL (ref 13.0–17.0)
MCH: 29.8 pg (ref 26.0–34.0)
MCHC: 32.7 g/dL (ref 30.0–36.0)
MCV: 91.3 fL (ref 80.0–100.0)
Platelets: 179 K/uL (ref 150–400)
RBC: 4.46 MIL/uL (ref 4.22–5.81)
RDW: 14.6 % (ref 11.5–15.5)
WBC: 8.6 K/uL (ref 4.0–10.5)
nRBC: 0 % (ref 0.0–0.2)

## 2023-10-27 LAB — BASIC METABOLIC PANEL WITH GFR
Anion gap: 5 (ref 5–15)
BUN: 15 mg/dL (ref 8–23)
CO2: 25 mmol/L (ref 22–32)
Calcium: 9.1 mg/dL (ref 8.9–10.3)
Chloride: 111 mmol/L (ref 98–111)
Creatinine, Ser: 1.56 mg/dL — ABNORMAL HIGH (ref 0.61–1.24)
GFR, Estimated: 50 mL/min — ABNORMAL LOW (ref >60)
Glucose, Bld: 93 mg/dL (ref 70–99)
Potassium: 4.3 mmol/L (ref 3.5–5.1)
Sodium: 141 mmol/L (ref 135–145)

## 2023-10-27 LAB — RESP PANEL BY RT-PCR (RSV, FLU A&B, COVID)  RVPGX2
Influenza A by PCR: NEGATIVE
Influenza B by PCR: NEGATIVE
Resp Syncytial Virus by PCR: NEGATIVE
SARS Coronavirus 2 by RT PCR: NEGATIVE

## 2023-10-27 LAB — CBC WITH DIFFERENTIAL/PLATELET
Abs Immature Granulocytes: 0.02 K/uL (ref 0.00–0.07)
Basophils Absolute: 0 K/uL (ref 0.0–0.1)
Basophils Relative: 0 %
Eosinophils Absolute: 0.2 K/uL (ref 0.0–0.5)
Eosinophils Relative: 2 %
HCT: 40.4 % (ref 39.0–52.0)
Hemoglobin: 13.3 g/dL (ref 13.0–17.0)
Immature Granulocytes: 0 %
Lymphocytes Relative: 23 %
Lymphs Abs: 2.3 K/uL (ref 0.7–4.0)
MCH: 30.2 pg (ref 26.0–34.0)
MCHC: 32.9 g/dL (ref 30.0–36.0)
MCV: 91.6 fL (ref 80.0–100.0)
Monocytes Absolute: 0.5 K/uL (ref 0.1–1.0)
Monocytes Relative: 5 %
Neutro Abs: 6.9 K/uL (ref 1.7–7.7)
Neutrophils Relative %: 70 %
Platelets: 204 K/uL (ref 150–400)
RBC: 4.41 MIL/uL (ref 4.22–5.81)
RDW: 14.5 % (ref 11.5–15.5)
WBC: 9.9 K/uL (ref 4.0–10.5)
nRBC: 0 % (ref 0.0–0.2)

## 2023-10-27 LAB — URINALYSIS, W/ REFLEX TO CULTURE (INFECTION SUSPECTED)
Bacteria, UA: NONE SEEN
Bilirubin Urine: NEGATIVE
Glucose, UA: NEGATIVE mg/dL
Hgb urine dipstick: NEGATIVE
Ketones, ur: NEGATIVE mg/dL
Leukocytes,Ua: NEGATIVE
Nitrite: NEGATIVE
Protein, ur: NEGATIVE mg/dL
Specific Gravity, Urine: 1.024 (ref 1.005–1.030)
Squamous Epithelial / HPF: 0 /HPF (ref 0–5)
pH: 5 (ref 5.0–8.0)

## 2023-10-27 LAB — LACTIC ACID, PLASMA: Lactic Acid, Venous: 1.1 mmol/L (ref 0.5–1.9)

## 2023-10-27 LAB — COMPREHENSIVE METABOLIC PANEL WITH GFR
ALT: 18 U/L (ref 0–44)
AST: 18 U/L (ref 15–41)
Albumin: 3.5 g/dL (ref 3.5–5.0)
Alkaline Phosphatase: 111 U/L (ref 38–126)
Anion gap: 8 (ref 5–15)
BUN: 16 mg/dL (ref 8–23)
CO2: 23 mmol/L (ref 22–32)
Calcium: 8.9 mg/dL (ref 8.9–10.3)
Chloride: 105 mmol/L (ref 98–111)
Creatinine, Ser: 2 mg/dL — ABNORMAL HIGH (ref 0.61–1.24)
GFR, Estimated: 37 mL/min — ABNORMAL LOW (ref >60)
Glucose, Bld: 140 mg/dL — ABNORMAL HIGH (ref 70–99)
Potassium: 4.2 mmol/L (ref 3.5–5.1)
Sodium: 136 mmol/L (ref 135–145)
Total Bilirubin: 0.2 mg/dL (ref 0.0–1.2)
Total Protein: 6.1 g/dL — ABNORMAL LOW (ref 6.5–8.1)

## 2023-10-27 LAB — HIV ANTIBODY (ROUTINE TESTING W REFLEX): HIV Screen 4th Generation wRfx: NONREACTIVE

## 2023-10-27 MED ORDER — ASPIRIN 81 MG PO TBEC
81.0000 mg | DELAYED_RELEASE_TABLET | Freq: Every day | ORAL | Status: DC
  Administered 2023-10-27: 81 mg via ORAL
  Filled 2023-10-27: qty 1

## 2023-10-27 MED ORDER — PANCRELIPASE (LIP-PROT-AMYL) 12000-38000 UNITS PO CPEP
24000.0000 [IU] | ORAL_CAPSULE | Freq: Three times a day (TID) | ORAL | Status: DC
  Filled 2023-10-27 (×2): qty 2

## 2023-10-27 MED ORDER — ONDANSETRON HCL 4 MG/2ML IJ SOLN: 4.0000 mg | Freq: Once | INTRAMUSCULAR | Status: DC

## 2023-10-27 MED ORDER — HYDRALAZINE HCL 20 MG/ML IJ SOLN
10.0000 mg | Freq: Four times a day (QID) | INTRAMUSCULAR | Status: DC | PRN
Start: 2023-10-27 — End: 2023-10-27

## 2023-10-27 MED ORDER — SENNOSIDES-DOCUSATE SODIUM 8.6-50 MG PO TABS: 1.0000 | ORAL_TABLET | Freq: Every evening | ORAL | Status: DC | PRN

## 2023-10-27 MED ORDER — PANTOPRAZOLE SODIUM 40 MG PO TBEC
40.0000 mg | DELAYED_RELEASE_TABLET | Freq: Every day | ORAL | Status: DC
  Administered 2023-10-27: 40 mg via ORAL
  Filled 2023-10-27: qty 1

## 2023-10-27 MED ORDER — GABAPENTIN 300 MG PO CAPS
900.0000 mg | ORAL_CAPSULE | Freq: Two times a day (BID) | ORAL | Status: AC
Start: 2023-10-27 — End: ?

## 2023-10-27 MED ORDER — HEPARIN SODIUM (PORCINE) 5000 UNIT/ML IJ SOLN
5000.0000 [IU] | Freq: Three times a day (TID) | INTRAMUSCULAR | Status: DC
  Administered 2023-10-27: 5000 [IU] via SUBCUTANEOUS
  Filled 2023-10-27: qty 1

## 2023-10-27 MED ORDER — VENLAFAXINE HCL ER 75 MG PO CP24
75.0000 mg | ORAL_CAPSULE | Freq: Every day | ORAL | Status: DC
  Administered 2023-10-27: 75 mg via ORAL
  Filled 2023-10-27 (×2): qty 1

## 2023-10-27 MED ORDER — ACETAMINOPHEN 500 MG PO TABS: 1000.0000 mg | ORAL_TABLET | Freq: Three times a day (TID) | ORAL | Status: DC | PRN

## 2023-10-27 MED ORDER — LACTATED RINGERS IV BOLUS
1000.0000 mL | Freq: Once | INTRAVENOUS | Status: AC
  Administered 2023-10-27: 1000 mL via INTRAVENOUS

## 2023-10-27 MED ORDER — TIZANIDINE HCL 4 MG PO TABS
4.0000 mg | ORAL_TABLET | Freq: Three times a day (TID) | ORAL | Status: DC
  Filled 2023-10-27 (×2): qty 1

## 2023-10-27 MED ORDER — VITAMIN B-12 1000 MCG PO TABS
1000.0000 ug | ORAL_TABLET | Freq: Every day | ORAL | Status: DC
  Administered 2023-10-27: 1000 ug via ORAL
  Filled 2023-10-27: qty 1

## 2023-10-27 MED ORDER — ATENOLOL 25 MG PO TABS
25.0000 mg | ORAL_TABLET | Freq: Every day | ORAL | Status: DC
  Filled 2023-10-27: qty 1

## 2023-10-27 MED ORDER — ALBUTEROL SULFATE (2.5 MG/3ML) 0.083% IN NEBU: 2.5000 mg | INHALATION_SOLUTION | RESPIRATORY_TRACT | Status: DC | PRN

## 2023-10-27 MED ORDER — ATORVASTATIN CALCIUM 20 MG PO TABS
20.0000 mg | ORAL_TABLET | Freq: Every day | ORAL | Status: DC
  Administered 2023-10-27: 20 mg via ORAL
  Filled 2023-10-27: qty 1

## 2023-10-27 MED ORDER — FENOFIBRATE 54 MG PO TABS
54.0000 mg | ORAL_TABLET | Freq: Every day | ORAL | Status: DC
  Administered 2023-10-27: 54 mg via ORAL
  Filled 2023-10-27: qty 1

## 2023-10-27 MED ORDER — FLUTICASONE FUROATE-VILANTEROL 200-25 MCG/ACT IN AEPB
1.0000 | INHALATION_SPRAY | Freq: Every day | RESPIRATORY_TRACT | Status: DC
  Administered 2023-10-27: 1 via RESPIRATORY_TRACT
  Filled 2023-10-27: qty 28

## 2023-10-27 MED ORDER — GABAPENTIN 300 MG PO CAPS
300.0000 mg | ORAL_CAPSULE | Freq: Three times a day (TID) | ORAL | Status: DC
  Administered 2023-10-27: 300 mg via ORAL
  Filled 2023-10-27: qty 1

## 2023-10-27 NOTE — Plan of Care (Signed)

## 2023-10-27 NOTE — Progress Notes (Signed)
 MD order received in [facility] to discharge pt home today; verbally reviewed AVS with pt, no questions voiced at this time; pt's discharge pending arrival of his son [facility]; verbally instructed pt to call when his son arrives in order for staff to discharge pt

## 2023-10-27 NOTE — Plan of Care (Signed)

## 2023-10-27 NOTE — ED Triage Notes (Signed)
 Pt BIBACEMS for syncopal episode at home. Pt was lowered to the floor by wife, pt reports aphagia and bilateral leg tremors. Pt reports he's asymptomatic now. I'm fine now. Reports hx of 1 similar episode, hx TIA, COPD.

## 2023-10-27 NOTE — ED Provider Notes (Signed)
 [facility] Provider Note    Event Date/Time   First MD Initiated Contact with Patient 10/27/23 0107     (approximate)   History   Loss of Consciousness   HPI  Henry Owens is a 63 y.o. male with history of COPD, anxiety, hypertension, hyperlipidemia, OSA, presenting with near syncopal episode.  Per wife patient was in the kitchen, he noted that he was having bilateral lower extremity weakness, she saw that his legs were shaking and about to give out, she rushed him and lowered him to the ground, he states that he felt like his vision was tunneling.  He denies any headaches, he denies prior history of strokes, no weakness or numbness.  He denies any nausea vomiting or diarrhea, no chest pain or shortness of breath.  He denies any urinary symptoms or cough.  No vision changes or difficulty with speech.  States no history of cancer, denies prior history of DVTs, no recent travel or surgeries, no hemoptysis, no unilateral Tenderness, denies any hormone use.  No prior history of MI.  He denies drugs or alcohol use.  Independent history obtained from wife as above.  Per independent issue from EMS, he came in for syncopal episode, was lowered to the ground.  Is asymptomatic now.  On independent review, he was seen by physiatry in mid June for lumbar radiculitis.  He denies any back pain at this time.     Physical Exam   Triage Vital Signs: ED Triage Vitals  Encounter Vitals Group     BP      Girls Systolic BP Percentile      Girls Diastolic BP Percentile      Boys Systolic BP Percentile      Boys Diastolic BP Percentile      Pulse      Resp      Temp      Temp src      SpO2      Weight      Height      Head Circumference      Peak Flow      Pain Score      Pain Loc      Pain Education      Exclude from Growth Chart     Most recent vital signs: Vitals:   10/27/23 0200 10/27/23 0206  BP: 128/77   Pulse: (!) 57 72  Resp: (!) 30 16  Temp:     SpO2: 92% 95%     General: Awake, no distress.  CV:  Good peripheral perfusion.  Resp:  Normal effort.  No tachypnea or respiratory distress Abd:  No distention.  Soft nontender Other:  Pupils are equal and reactive, extraocular movements are intact, no facial asymmetry, moving all 4 extremity without focal weakness or numbness, he has very dry mucous membranes.   ED Results / Procedures / Treatments   Labs (all labs ordered are listed, but only abnormal results are displayed) Labs Reviewed  COMPREHENSIVE METABOLIC PANEL WITH GFR - Abnormal; Notable for the following components:      Result Value   Glucose, Bld 140 (*)    Creatinine, Ser 2.00 (*)    Total Protein 6.1 (*)    GFR, Estimated 37 (*)    All other components within normal limits  URINALYSIS, W/ REFLEX TO CULTURE (INFECTION SUSPECTED) - Abnormal; Notable for the following components:   Color, Urine YELLOW (*)    APPearance CLEAR (*)  All other components within normal limits  CULTURE, BLOOD (ROUTINE X 2)  CULTURE, BLOOD (ROUTINE X 2)  RESP PANEL BY RT-PCR (RSV, FLU A&B, COVID)  [id number]  CBC WITH DIFFERENTIAL/PLATELET  LACTIC ACID, PLASMA  LACTIC ACID, PLASMA  TROPONIN I (HIGH SENSITIVITY)  TROPONIN I (HIGH SENSITIVITY)     EKG  EKG shows, sinus rhythm, rate 74, normal QRS, normal QTc, T wave flattening in aVL, no obvious ischemic ST elevation, not significantly changed compared to prior   RADIOLOGY On my independent interpretation, CT head without obvious intracranial hemorrhage   PROCEDURES:  Critical Care performed: No  Procedures   MEDICATIONS ORDERED IN ED: Medications  lactated ringers  bolus 1,000 mL (1,000 mLs Intravenous New Bag/Given 10/27/23 0159)     IMPRESSION / MDM / ASSESSMENT AND PLAN / ED COURSE  I reviewed the triage vital signs and the nursing notes.                              Differential diagnosis includes, but is not limited to, dehydration, electrolyte derangements,  arrhythmia, occult infection.  Labs, lactic acid, blood cultures, UA, chest x-ray, EKG, troponin, IV fluids.  Considered CVA versus TIA but he has no focal deficits on exam, he does not report any focal weakness or numbness, no aphasia or word finding difficulties.  Patient's presentation is most consistent with acute presentation with potential threat to life or bodily function.  Independent interpretation of labs and imaging below.  Given the AKI, near syncope, dehydration, he will need to be admitted for further management.  Consulted hospitalist was agreeable with plan for admission and will evaluate the patient.  He is admitted.  The patient is on the cardiac monitor to evaluate for evidence of arrhythmia and/or significant heart rate changes.   Clinical Course as of 10/27/23 0258  Sat Oct 27, 2023  0221 CT Head Wo Contrast No acute intracranial abnormality.  [TT]  0221 DG Chest 2 View IMPRESSION: 1. No active cardiopulmonary disease. 2. Question acute vertebral body height loss of a lower thoracic spine vertebral body. Correlate with point tenderness to palpation to evaluate for an acute component.   [TT]  9742 Independent review of labs, he is in AKI, electrolytes not severely deranged, LFTs are normal, UA is not consistent with UTI, lactate is not elevated.  Troponin is negative, no leukocytosis. [TT]    Clinical Course User Index [TT] Way[place], Lorelle Cummins, MD     FINAL CLINICAL IMPRESSION(S) / ED DIAGNOSES   Final diagnoses:  AKI (acute kidney injury) (HCC)  Near syncope     Rx / DC Orders   ED Discharge Orders     None        Note:  This document was prepared using Dragon voice recognition software and may include unintentional dictation errors.    [place] Lorelle Cummins, MD 10/27/23 [phone]

## 2023-10-27 NOTE — H&P (Signed)
 History and Physical    Patient: Henry Owens FMW:[id number] DOB: October 04, 1960 DOA: 10/27/2023 DOS: the patient was seen and examined on 10/27/2023 PCP: Ostwalt, Janna, PA-C  Patient coming from: Home  Chief Complaint:  Chief Complaint  Patient presents with   Loss of Consciousness   HPI: Henry Owens is a 63 y.o. male with medical history significant of HTN, HLD,COPD, obstructive sleep apnea noncompliant with CPAP, obesity,Anxiety disorder.  He presents from home on account of a near syncopal event at home.  He felt sudden onset of lower extremity weakness.  Per wife who witnessed event, she saw his leg shaking and the point of near collapse.  She was able to reach out and prevent a fall.  He denies any new focal weakness at this time.  Denies any lower extremity numbness.  No prior history of recent syncope or arrhythmias.  He denies any associated preceding aura.  Denies chest or back pain, palpitations, nausea or vomiting.  Workup in the ED was significant for acute kidney injury from baseline of 1.30.  In the ED, patient was noted to be snoring.  Patient has previously been referred for CPAP but due to poor tolerability has been noncompliant.  Past Medical History:  Diagnosis Date   Allergy 2020   Anxiety    Arthritis    Blood transfusion without reported diagnosis 11/2022   COPD (chronic obstructive pulmonary disease) (HCC) 2023   Depression    Emphysema of lung (HCC)    Flu 07/2017   GERD (gastroesophageal reflux disease) 2023   Hyperlipidemia    Hypertension    Intractable nausea and vomiting 12/11/2022   Nodule of right lung    Oxygen deficiency 2023   Only when needed   Sleep apnea 2024   Stroke (HCC) 11/2022   Tinnitus    Past Surgical History:  Procedure Laterality Date   APPENDECTOMY  1975   BACK SURGERY     COLONOSCOPY WITH PROPOFOL  N/A 05/18/2023   Procedure: COLONOSCOPY WITH PROPOFOL ;  Surgeon: Maryruth Ole DASEN, MD;  Location: [facility];   Service: Endoscopy;  Laterality: N/A;   ESOPHAGOGASTRODUODENOSCOPY (EGD) WITH PROPOFOL  N/A 05/18/2023   Procedure: ESOPHAGOGASTRODUODENOSCOPY (EGD) WITH PROPOFOL ;  Surgeon: Maryruth Ole DASEN, MD;  Location: [facility];  Service: Endoscopy;  Laterality: N/A;   HEMOSTASIS CLIP PLACEMENT  05/18/2023   Procedure: HEMOSTASIS CLIP PLACEMENT;  Surgeon: Maryruth Ole DASEN, MD;  Location: [facility];  Service: Endoscopy;;   KNEE ARTHROSCOPY WITH MEDIAL MENISECTOMY Right 08/20/2017   Procedure: KNEE ARTHROSCOPY WITH PARTIAL MEDIAL AND LATERAL MENISECTOMY,PARTIAL SYNOVECTOMY,CHONDROPLASTY, LOOSE BODY REMOVAL;  Surgeon: Leora Lynwood SAUNDERS, MD;  Location: [facility];  Service: Orthopedics;  Laterality: Right;   KNEE SURGERY  1975   1996, 2000   POLYPECTOMY  05/18/2023   Procedure: POLYPECTOMY;  Surgeon: Maryruth Ole DASEN, MD;  Location: [facility];  Service: Endoscopy;;   SPINE SURGERY  9 1 89   Last of multiple procedures,  limbar fusion   SUBMUCOSAL LIFTING INJECTION  05/18/2023   Procedure: SUBMUCOSAL LIFTING INJECTION;  Surgeon: Maryruth Ole DASEN, MD;  Location: [facility];  Service: Endoscopy;;   Social History:  reports that he has been smoking cigarettes. He started smoking about 42 years ago. He has a 81.6 pack-year smoking history. He has never used smokeless tobacco. He reports that he does not currently use drugs after having used the following drugs: Marijuana. He reports that he does not drink alcohol.  Allergies  Allergen Reactions   Wound Dressing Adhesive  Other (See Comments)    BLISTER   Azithromycin  Rash   Penicillin G Rash    Has patient had a PCN reaction causing immediate rash, facial/tongue/throat swelling, SOB or lightheadedness with hypotension: Yes Has patient had a PCN reaction causing severe rash involving mucus membranes or skin necrosis: No Has patient had a PCN reaction that required hospitalization: No Has patient had a PCN reaction occurring within the last  10 years: No If all of the above answers are NO, then may proceed with Cephalosporin use.     Family History  Problem Relation Age of Onset   Hypertension Mother    Stroke Mother    Cancer Mother        colon   Arthritis Mother    Depression Mother    Stroke Father    Arthritis Father    Hearing loss Father    Anxiety disorder Son    Anxiety disorder Daughter    Obesity Sister    Bladder Cancer Neg Hx    Prostate cancer Neg Hx    Hematuria Neg Hx     Prior to Admission medications   Medication Sig Start Date End Date Taking? Authorizing Provider  acetaminophen  (TYLENOL ) 500 MG tablet Take 1,000 mg by mouth in the morning, at noon, and at bedtime.    [provider]  albuterol  (VENTOLIN  HFA) 108 (90 Base) MCG/ACT inhaler Inhale 2 puffs into the lungs every 6 (six) hours as needed for wheezing or shortness of breath. 06/01/23   Ostwalt, Janna, PA-C  aspirin  EC 81 MG tablet Take 81 mg by mouth daily. Swallow whole.    [provider]  atenolol  (TENORMIN ) 50 MG tablet TAKE 1/2 TABLET(25 MG) BY MOUTH DAILY 10/16/23   Ostwalt, Janna, PA-C  atorvastatin  (LIPITOR) 20 MG tablet Take 1 tablet (20 mg total) by mouth daily. 08/09/23   Ostwalt, Janna, PA-C  celecoxib  (CELEBREX ) 100 MG capsule TAKE 1 CAPSULE(100 MG) BY MOUTH TWICE DAILY. DISCONTINUE IF THERE IS AN ALLERGIC REACTION. TAKE WITH MEALS. WILL REASSESS AT THE. FOLLOW-UP 10/16/23   Ostwalt, Janna, PA-C  Cholecalciferol (D 1000) 25 MCG (1000 UT) capsule Take 1,000 Units by mouth daily.    [provider]  cyanocobalamin  1000 MCG tablet Take 1 tablet (1,000 mcg total) by mouth daily. 12/15/22   Laurita Pillion, MD  fenofibrate  (TRICOR ) 48 MG tablet TAKE 1 TABLET(48 MG) BY MOUTH DAILY 08/06/23   Ostwalt, Janna, PA-C  fluticasone  (FLONASE ) 50 MCG/ACT nasal spray Place 1 spray into both nostrils daily as needed for allergies or rhinitis.    [provider]  gabapentin  (NEURONTIN ) 300 MG capsule Take 900 mg by  mouth 3 (three) times daily.    [provider]  mometasone -formoterol  (DULERA ) 100-5 MCG/ACT AERO Inhale 2 puffs into the lungs 2 (two) times daily. 07/09/23   Ostwalt, Janna, PA-C  OXYGEN Inhale into the lungs. As needed Has not used in a year    [provider]  Pancrelipase , Lip-Prot-Amyl, (CREON ) 24000-76000 units CPEP Take 1 capsule (24,000 Units total) by mouth 3 (three) times daily before meals. 12/14/22   Laurita Pillion, MD  pantoprazole  (PROTONIX ) 40 MG tablet TAKE 1 TABLET(40 MG) BY MOUTH DAILY 10/22/23   Ostwalt, Janna, PA-C  sildenafil  (REVATIO ) 20 MG tablet 1-5 tablets by mouth daily as needed, take 30 minutes prior to sexual intercourse. 05/07/23   Gaston Hamilton, MD  Spacer/Aero-Holding [facility] Use as directed for inhaler use 07/27/23   Tamea Dedra CROME, MD  tadalafil  (CIALIS )  20 MG tablet Take 1 tablet (20 mg total) by mouth daily as needed for erectile dysfunction. One hour prior to intercourse 08/24/23   Helon Kirsch A, PA-C  tadalafil  (CIALIS ) 5 MG tablet Take 1 tablet (5 mg total) by mouth daily as needed for erectile dysfunction. 08/24/23   Helon Kirsch A, PA-C  tiZANidine  (ZANAFLEX ) 4 MG tablet Take 4 mg by mouth 3 (three) times daily. 11/24/22   [provider]  venlafaxine  XR (EFFEXOR  XR) 75 MG 24 hr capsule Take 1 capsule (75 mg total) by mouth daily with breakfast. 07/09/23   Dineen, Janna, PA-C  venlafaxine  XR (EFFEXOR -XR) 150 MG 24 hr capsule TAKE 1 CAPSULE(150 MG) BY MOUTH DAILY WITH BREAKFAST 07/03/23   Ostwalt, Janna, PA-C    Physical Exam: Vitals:   10/27/23 0200 10/27/23 0206 10/27/23 0230 10/27/23 0300  BP: 128/77  130/72 138/89  Pulse: (!) 57 72 (!) 57 75  Resp: (!) 30 16 19 17   Temp:      TempSrc:      SpO2: 92% 95% 91% 92%  Weight:      Height:        Data Reviewed: Sodium 136, Potassium 4.2, Cl 105, Bicarb 23, Glu 140 ,BUN 16, Scr 2.0 AST 18 ALT 18, 0.2 WBC 919 HB 13.3 HCT 40 PLT 204 N 70 Tchol 213 Trig 443   Urinalysis- Unremarkable  CT head : No acute intracranial abnormality  CT IMPRESSION: 1. No active cardiopulmonary disease. 2. Question acute vertebral body height loss of a lower thoracic spine vertebral body.  Assessment and Plan: 63 year old obese gentleman with near syncope.  Sudden onset of bilateral lower extremity weakness with near fall.  Denies any loss of consciousness.  Incidentally found to have questionable acute vertebral body height loss of lower thoracic spine.  No tenderness appreciated at this time.  Acute fall secondary to bilateral lower extremity weakness: Unclear if questionable vertebral loss may be contributory.  Patient does not exhibit any neurologic deficits.  Denies any back pain at this time.  Possible differentials includes vasovagal syncope due to dehydration.  Patient reportedly repleted.  Will monitor on telemetry for any arrhythmias.  Consider MRI of the thoracic spine if symptoms do persist.  PT and OT will be consulted to help with disposition.  Near syncope: Check orthostasis in a.m. cardiac markers remain flat.  CT head was unremarkable for any acute abnormality.  EKG reviewed showed sinus rhythm.  Normal PR and QRS interval.  No EKG changes suggestive of ischemia.  Acute on chronic kidney injury: Likely prerenal etiology secondary to dehydration.  Patient will be volume repleted.  Will avoid nephrotoxic medication.  Moderate to severe obstructive sleep apnea: Patient had previously not tolerated CPAP machine.    Hyperlipidemia: Continue with statins.  Hypertension: Resume on home medications.  Hold if systolic blood pressure less than 110 mmHg  VTE prophylaxis with Lovenox    Advance Care Planning:   Code Status: Full Code   Consults: Physical therapy recreational therapy  Family Communication: Wife at bedside were updated.  Severity of Illness: The appropriate patient status for this patient is OBSERVATION. Observation status is judged to be  reasonable and necessary in order to provide the required intensity of service to ensure the patient's safety. The patient's presenting symptoms, physical exam findings, and initial radiographic and laboratory data in the context of their medical condition is felt to place them at decreased risk for further clinical deterioration. Furthermore, it is anticipated that the patient will be  medically stable for discharge from the hospital within 2 midnights of admission.   Author: Maude MARLA Dart, MD 10/27/2023 3:53 AM  For on call review [email or web address].

## 2023-10-28 NOTE — Discharge Summary (Signed)
 Physician Discharge Summary   Patient: Henry Owens MRN: [id number] DOB: 02-Jul-1960  Admit date:     10/27/2023  Discharge date: 10/27/2023  Discharge Physician: Cresencio Fairly   PCP: Ostwalt, Janna, PA-C   Recommendations at discharge:   Follow-up with outpatient providers as requested  Discharge Diagnoses: Principal Problem:   Syncope and collapse  Hospital Course: Assessment and Plan:  63 y.o. male with medical history significant of HTN, HLD,COPD, obstructive sleep apnea noncompliant with CPAP, obesity,Anxiety disorder was admitted for presyncope/near fall  Incidentally found to have questionable acute vertebral body height loss of lower thoracic spine.  No tenderness appreciated at this time.   Near fall secondary to bilateral lower extremity weakness: Patient does not exhibit any neurologic deficits.  Denies any back pain at this time.  Likely due to vasovagal syncope due to dehydration.  - cardiac markers remain flat.  CT head was unremarkable for any acute abnormality.  EKG reviewed showed sinus rhythm.  Normal PR and QRS interval.  No EKG changes suggestive of ischemia. -I have requested him to hold off tadalafil  for now as this could lower blood pressure and can give you presyncopal symptoms.  He is agreeable - He recently had a urology visit on 10/18/2023 when his tadalafil  dose was adjusted for erectile dysfunction.  Tadalafil  5 mg daily and augmenting with tadalafil  20 mg on demand dosing.  I have instructed him to hold off on this medication for the time being. - He is feeling fine after volume repletion.  He ambulated without any reoccurrence of symptoms.  He is not interested in any further workup while in the hospital.  His wife/partner is at bedside and agreeable with the discharge plan - Sleep apnea could be a contributor - I have requested him to stop his blood pressure medicine/atenolol , celecoxib , tadalafil  and sildenafil   Acute on chronic kidney injury: Likely  prerenal etiology secondary to dehydration.  Improved with volume repletion   Moderate to severe obstructive sleep apnea: Patient had previously not tolerated CPAP machine.  Requested to get formal sleep study in CPAP   Hyperlipidemia: Continue with statins.   Hypertension: Stopping blood pressure medicine at discharge        Disposition: Home Diet recommendation:  Discharge Diet Orders (From admission, onward)     Start     Ordered   10/27/23 0000  Diet - low sodium heart healthy        10/27/23 0959           Carb modified diet DISCHARGE MEDICATION: Allergies as of 10/27/2023       Reactions   Wound Dressing Adhesive Other (See Comments)   BLISTER   Azithromycin  Rash   Penicillin G Rash   Has patient had a PCN reaction causing immediate rash, facial/tongue/throat swelling, SOB or lightheadedness with hypotension: Yes Has patient had a PCN reaction causing severe rash involving mucus membranes or skin necrosis: No Has patient had a PCN reaction that required hospitalization: No Has patient had a PCN reaction occurring within the last 10 years: No If all of the above answers are NO, then may proceed with Cephalosporin use.        Medication List     STOP taking these medications    atenolol  50 MG tablet Commonly known as: TENORMIN    celecoxib  100 MG capsule Commonly known as: CELEBREX    sildenafil  20 MG tablet Commonly known as: REVATIO    tadalafil  20 MG tablet Commonly known as: CIALIS    tadalafil  5  MG tablet Commonly known as: CIALIS        TAKE these medications    acetaminophen  500 MG tablet Commonly known as: TYLENOL  Take 1,000 mg by mouth in the morning, at noon, and at bedtime.   albuterol  108 (90 Base) MCG/ACT inhaler Commonly known as: VENTOLIN  HFA Inhale 2 puffs into the lungs every 6 (six) hours as needed for wheezing or shortness of breath.   aspirin  EC 81 MG tablet Take 81 mg by mouth daily. Swallow whole.   atorvastatin  20  MG tablet Commonly known as: LIPITOR Take 1 tablet (20 mg total) by mouth daily.   Creon  24000-76000 units Cpep Generic drug: Pancrelipase  (Lip-Prot-Amyl) Take 1 capsule (24,000 Units total) by mouth 3 (three) times daily before meals.   cyanocobalamin  1000 MCG tablet Take 1 tablet (1,000 mcg total) by mouth daily.   D 1000 25 MCG (1000 UT) capsule Generic drug: Cholecalciferol Take 1,000 Units by mouth daily.   fenofibrate  48 MG tablet Commonly known as: TRICOR  TAKE 1 TABLET(48 MG) BY MOUTH DAILY   fluticasone  50 MCG/ACT nasal spray Commonly known as: FLONASE  Place 1 spray into both nostrils daily as needed for allergies or rhinitis.   gabapentin  300 MG capsule Commonly known as: NEURONTIN  Take 3 capsules (900 mg total) by mouth 2 (two) times daily. What changed: when to take this   mometasone -formoterol  100-5 MCG/ACT Aero Commonly known as: DULERA  Inhale 2 puffs into the lungs 2 (two) times daily.   OXYGEN Inhale into the lungs. As needed Has not used in a year   pantoprazole  40 MG tablet Commonly known as: PROTONIX  TAKE 1 TABLET(40 MG) BY MOUTH DAILY   Spacer/Aero-Holding Raguel French Use as directed for inhaler use   tiZANidine  4 MG tablet Commonly known as: ZANAFLEX  Take 4 mg by mouth 3 (three) times daily.   venlafaxine  XR 75 MG 24 hr capsule Commonly known as: Effexor  XR Take 1 capsule (75 mg total) by mouth daily with breakfast. What changed: Another medication with the same name was removed. Continue taking this medication, and follow the directions you see here.        Follow-up Information     Ostwalt, Janna, PA-C. Go on 10/29/2023.   Specialty: Physician Assistant Why: as scheduled Contact information: [address] [place] [address] [phone]         Lane Arthea BRAVO, MD. Schedule an appointment as soon as possible for a visit in 1 week(s).   Specialty: Neurology Why: [facility] Discharge F/UP Contact  information: [address] [facility] [place] [address] [phone]         Avanell Katz, MD. Schedule an appointment as soon as possible for a visit in 2 week(s).   Specialty: Physical Medicine and Rehabilitation Why: [facility] Discharge F/UP Contact information: [address] [place] [address] [phone]                Discharge Exam: Fredricka Weights   10/27/23 0110  Weight: 90.6 kg   Constitutional:  Well nourished. Alert and oriented, No acute distress. HEENT: [place] AT, moist mucus membranes.  Trachea midline Cardiovascular: No clubbing, cyanosis, or edema. Respiratory: Normal respiratory effort, no increased work of breathing. Neurologic: Grossly intact, no focal deficits, moving all 4 extremities. Psychiatric: Normal mood and affect.   Condition at discharge: good  The results of significant diagnostics from this hospitalization (including imaging, microbiology, ancillary and laboratory) are listed below for reference.   Imaging Studies: CT Head  Wo Contrast Result Date: 10/27/2023 CLINICAL DATA:  Transient ischemic attack (TIA) syncopal episode at home. Pt was lowered to the floor by wife, pt reports aphagia and bilateral leg tremors. Pt reports he's asymptomatic now. I'm fine now. Reports hx of 1 similar episode, hx TIA, COPD. EXAM: CT HEAD WITHOUT CONTRAST TECHNIQUE: Contiguous axial images were obtained from the base of the skull through the vertex without intravenous contrast. RADIATION DOSE REDUCTION: This exam was performed according to the departmental dose-optimization program which includes automated exposure control, adjustment of the mA and/or kV according to patient size and/or use of iterative reconstruction technique. COMPARISON:  CT head 11/11/2022 FINDINGS: Brain: Patchy and confluent areas of decreased attenuation are noted throughout the deep and periventricular white matter of the cerebral  hemispheres bilaterally, compatible with chronic microvascular ischemic disease. Chronic right basal ganglia lacunar infarction. No evidence of large-territorial acute infarction. No parenchymal hemorrhage. No mass lesion. No extra-axial collection. No mass effect or midline shift. No hydrocephalus. Basilar cisterns are patent. Vascular: No hyperdense vessel. Skull: No acute fracture or focal lesion. Sinuses/Orbits: Paranasal sinuses and mastoid air cells are clear. The orbits are unremarkable. Other: None. IMPRESSION: No acute intracranial abnormality. Electronically Signed   By: Morgane  Naveau M.D.   On: 10/27/2023 01:52   DG Chest 2 View Result Date: 10/27/2023 CLINICAL DATA:  syncope EXAM: CHEST - 2 VIEW COMPARISON:  Chest x-ray 03/03/2023, CT chest 11/26/2022 FINDINGS: The heart and mediastinal contours are within normal limits. No focal consolidation. No pulmonary edema. No pleural effusion. No pneumothorax. Question acute vertebral body height loss of a lower thoracic spine vertebral body. Degenerative changes of the mid to lower thoracic spine. Right acromioclavicular joint degenerative changes. IMPRESSION: 1. No active cardiopulmonary disease. 2. Question acute vertebral body height loss of a lower thoracic spine vertebral body. Correlate with point tenderness to palpation to evaluate for an acute component. Electronically Signed   By: Morgane  Naveau M.D.   On: 10/27/2023 01:50    Microbiology: Results for orders placed or performed during the hospital encounter of 10/27/23  Culture, blood (routine x 2)     Status: None (Preliminary result)   Collection Time: 10/27/23  1:25 AM   Specimen: BLOOD RIGHT ARM  Result Value Ref Range Status   Specimen Description BLOOD RIGHT ARM  Final   Special Requests   Final    BOTTLES DRAWN AEROBIC AND ANAEROBIC Blood Culture adequate volume   Culture   Final    NO GROWTH 1 DAY Performed at [facility], [address] [place], [place]  [address]    Report Status PENDING  Incomplete  Culture, blood (routine x 2)     Status: None (Preliminary result)   Collection Time: 10/27/23  1:25 AM   Specimen: BLOOD  Result Value Ref Range Status   Specimen Description BLOOD RIGHT ANTECUBITAL  Final   Special Requests   Final    BOTTLES DRAWN AEROBIC AND ANAEROBIC Blood Culture adequate volume   Culture   Final    NO GROWTH 1 DAY Performed at [facility], [address] [place], [place] [address]    Report Status PENDING  Incomplete  Resp panel by RT-PCR (RSV, Flu A&B, Covid) Anterior Nasal Swab     Status: None   Collection Time: 10/27/23  1:57 AM   Specimen: Anterior Nasal Swab  Result Value Ref Range Status   SARS Coronavirus 2 by RT PCR NEGATIVE NEGATIVE Final    Comment: (NOTE) SARS-CoV-2 target nucleic  acids are NOT DETECTED.  The SARS-CoV-2 RNA is generally detectable in upper respiratory specimens during the acute phase of infection. The lowest concentration of SARS-CoV-2 viral copies this assay can detect is 138 copies/mL. A negative result does not preclude SARS-Cov-2 infection and should not be used as the sole basis for treatment or other patient management decisions. A negative result may occur with  improper specimen collection/handling, submission of specimen other than nasopharyngeal swab, presence of viral mutation(s) within the areas targeted by this assay, and inadequate number of viral copies(<138 copies/mL). A negative result must be combined with clinical observations, patient history, and epidemiological information. The expected result is Negative.  Fact Sheet for Patients:  BloggerCourse.com  Fact Sheet for Healthcare Providers:  SeriousBroker.it  This test is no t yet approved or cleared by the [place]  FDA and  has been authorized for detection and/or diagnosis of SARS-CoV-2 by FDA under an Emergency Use Authorization (EUA). This  EUA will remain  in effect (meaning this test can be used) for the duration of the COVID-19 declaration under Section 564(b)(1) of the Act, 21 U.S.C.section 360bbb-3(b)(1), unless the authorization is terminated  or revoked sooner.       Influenza A by PCR NEGATIVE NEGATIVE Final   Influenza B by PCR NEGATIVE NEGATIVE Final    Comment: (NOTE) The Xpert Xpress SARS-CoV-2/FLU/RSV plus assay is intended as an aid in the diagnosis of influenza from Nasopharyngeal swab specimens and should not be used as a sole basis for treatment. Nasal washings and aspirates are unacceptable for Xpert Xpress SARS-CoV-2/FLU/RSV testing.  Fact Sheet for Patients: BloggerCourse.com  Fact Sheet for Healthcare Providers: SeriousBroker.it  This test is not yet approved or cleared by the [place]  FDA and has been authorized for detection and/or diagnosis of SARS-CoV-2 by FDA under an Emergency Use Authorization (EUA). This EUA will remain in effect (meaning this test can be used) for the duration of the COVID-19 declaration under Section 564(b)(1) of the Act, 21 U.S.C. section 360bbb-3(b)(1), unless the authorization is terminated or revoked.     Resp Syncytial Virus by PCR NEGATIVE NEGATIVE Final    Comment: (NOTE) Fact Sheet for Patients: BloggerCourse.com  Fact Sheet for Healthcare Providers: SeriousBroker.it  This test is not yet approved or cleared by the [place]  FDA and has been authorized for detection and/or diagnosis of SARS-CoV-2 by FDA under an Emergency Use Authorization (EUA). This EUA will remain in effect (meaning this test can be used) for the duration of the COVID-19 declaration under Section 564(b)(1) of the Act, 21 U.S.C. section 360bbb-3(b)(1), unless the authorization is terminated or revoked.  Performed at [facility], [address] [place], [place]  [address]     Labs: CBC: Recent Labs  Lab 10/27/23 0118 10/27/23 0516  WBC 9.9 8.6  NEUTROABS 6.9  --   HGB 13.3 13.3  HCT 40.4 40.7  MCV 91.6 91.3  PLT 204 179   Basic Metabolic Panel: Recent Labs  Lab 10/27/23 0118 10/27/23 0516  NA 136 141  K 4.2 4.3  CL 105 111  CO2 23 25  GLUCOSE 140* 93  BUN 16 15  CREATININE 2.00* 1.56*  CALCIUM  8.9 9.1   Liver Function Tests: Recent Labs  Lab 10/27/23 0118  AST 18  ALT 18  ALKPHOS 111  BILITOT 0.2  PROT 6.1*  ALBUMIN 3.5   CBG: No results for input(s): GLUCAP in the last 168 hours.  Discharge time spent: greater than 30 minutes.  Signed: Luzelena Heeg  Maree, MD [facility] 10/28/2023

## 2023-10-29 ENCOUNTER — Telehealth: Payer: Self-pay

## 2023-10-29 ENCOUNTER — Ambulatory Visit: Admitting: Physician Assistant

## 2023-10-29 ENCOUNTER — Other Ambulatory Visit: Payer: Self-pay | Admitting: Family Medicine

## 2023-10-29 ENCOUNTER — Encounter: Payer: Self-pay | Admitting: Physician Assistant

## 2023-10-29 VITALS — BP 119/83 | HR 82 | Ht 70.0 in | Wt 195.5 lb

## 2023-10-29 DIAGNOSIS — I10 Essential (primary) hypertension: Secondary | ICD-10-CM | POA: Diagnosis not present

## 2023-10-29 DIAGNOSIS — M542 Cervicalgia: Secondary | ICD-10-CM

## 2023-10-29 DIAGNOSIS — E782 Mixed hyperlipidemia: Secondary | ICD-10-CM

## 2023-10-29 DIAGNOSIS — G4739 Other sleep apnea: Secondary | ICD-10-CM

## 2023-10-29 DIAGNOSIS — R55 Syncope and collapse: Secondary | ICD-10-CM | POA: Diagnosis not present

## 2023-10-29 DIAGNOSIS — M5412 Radiculopathy, cervical region: Secondary | ICD-10-CM | POA: Diagnosis not present

## 2023-10-29 DIAGNOSIS — Z79899 Other long term (current) drug therapy: Secondary | ICD-10-CM | POA: Diagnosis not present

## 2023-10-29 DIAGNOSIS — M48062 Spinal stenosis, lumbar region with neurogenic claudication: Secondary | ICD-10-CM | POA: Diagnosis not present

## 2023-10-29 DIAGNOSIS — M4802 Spinal stenosis, cervical region: Secondary | ICD-10-CM | POA: Diagnosis not present

## 2023-10-29 DIAGNOSIS — M5416 Radiculopathy, lumbar region: Secondary | ICD-10-CM | POA: Diagnosis not present

## 2023-10-29 DIAGNOSIS — M47814 Spondylosis without myelopathy or radiculopathy, thoracic region: Secondary | ICD-10-CM | POA: Diagnosis not present

## 2023-10-29 NOTE — Transitions of Care (Post Inpatient/ED Visit) (Signed)
 10/29/2023  Name: Henry Owens MRN: [id number] DOB: Nov 10, 1960  Today's TOC FU Call Status: Today's TOC FU Call Status:: Successful TOC FU Call Completed TOC FU Call Complete Date: 10/29/23 Patient's Name and Date of Birth confirmed.  Transition Care Management Follow-up Telephone Call Date of Discharge: 10/27/23 Discharge Facility: [facility]) Type of Discharge: Inpatient Admission Primary Inpatient Discharge Diagnosis:: syncope How have you been since you were released from the hospital?: Better Any questions or concerns?: No  Items Reviewed: Did you receive and understand the discharge instructions provided?: Yes Medications obtained,verified, and reconciled?: Yes (Medications Reviewed) Any new allergies since your discharge?: No Dietary orders reviewed?: Yes Do you have support at home?: Yes People in Home [RPT]: spouse  Medications Reviewed Today: Medications Reviewed Today     Reviewed by Emmitt Pan, LPN (Licensed Practical Nurse) on 10/29/23 at [phone]  Med List Status: <None>   Medication Order Taking? Sig Documenting Provider Last Dose Status Informant  acetaminophen  (TYLENOL ) 500 MG tablet [id number] Yes Take 1,000 mg by mouth in the morning, at noon, and at bedtime. [provider]  Active Spouse/Significant Other  albuterol  (VENTOLIN  HFA) 108 (90 Base) MCG/ACT inhaler [id number] Yes Inhale 2 puffs into the lungs every 6 (six) hours as needed for wheezing or shortness of breath. Ostwalt, Janna, PA-C  Active   aspirin  EC 81 MG tablet [id number] Yes Take 81 mg by mouth daily. Swallow whole. [provider]  Active   atorvastatin  (LIPITOR) 20 MG tablet [id number] Yes Take 1 tablet (20 mg total) by mouth daily. Ostwalt, Janna, PA-C  Active   Cholecalciferol (D 1000) 25 MCG (1000 UT) capsule [id number] Yes Take 1,000 Units by mouth daily. [provider]  Active   cyanocobalamin  1000 MCG tablet [id number] Yes Take  1 tablet (1,000 mcg total) by mouth daily. Laurita Pillion, MD  Active   fenofibrate  (TRICOR ) 48 MG tablet [id number] Yes TAKE 1 TABLET(48 MG) BY MOUTH DAILY Ostwalt, Janna, PA-C  Active   fluticasone  (FLONASE ) 50 MCG/ACT nasal spray [id number] Yes Place 1 spray into both nostrils daily as needed for allergies or rhinitis. [provider]  Active Spouse/Significant Other  gabapentin  (NEURONTIN ) 300 MG capsule [id number] Yes Take 3 capsules (900 mg total) by mouth 2 (two) times daily. Maree Hue, MD  Active   mometasone -formoterol  (DULERA ) 100-5 MCG/ACT AERO [id number] Yes Inhale 2 puffs into the lungs 2 (two) times daily. Ostwalt, Janna, PA-C  Active   OXYGEN [id number] Yes Inhale into the lungs. As needed Has not used in a year [provider]  Active   Pancrelipase , Lip-Prot-Amyl, (CREON ) 24000-76000 units CPEP [id number] Yes Take 1 capsule (24,000 Units total) by mouth 3 (three) times daily before meals. Laurita Pillion, MD  Active   pantoprazole  (PROTONIX ) 40 MG tablet [id number] Yes TAKE 1 TABLET(40 MG) BY MOUTH DAILY Ostwalt, Janna, PA-C  Active   Spacer/Aero-Holding Raguel FRENCH [id number] Yes Use as directed for inhaler use Tamea Dedra CROME, MD  Active   tiZANidine  (ZANAFLEX ) 4 MG tablet [id number] Yes Take 4 mg by mouth 3 (three) times daily. [provider]  Active Spouse/Significant Other  venlafaxine  XR (EFFEXOR  XR) 75 MG 24 hr capsule [id number] Yes Take 1 capsule (75 mg total) by mouth daily with breakfast. Ostwalt, Janna, PA-C  Active             Home Care and Equipment/Supplies: Were Home Health Services Ordered?: NA Any new equipment or medical supplies ordered?: NA  Functional Questionnaire: Do you  need assistance with bathing/showering or dressing?: No Do you need assistance with meal preparation?: No Do you need assistance with eating?: No Do you have difficulty maintaining continence: No Do you need assistance with getting out of bed/getting out of a  chair/moving?: No Do you have difficulty managing or taking your medications?: No  Follow up appointments reviewed: PCP Follow-up appointment confirmed?: Yes Date of PCP follow-up appointment?: 10/29/23 Follow-up Provider: [facility] Follow-up appointment confirmed?: Yes Date of Specialist follow-up appointment?: 11/22/23 Follow-Up Specialty Provider:: neuro Do you need transportation to your follow-up appointment?: No Do you understand care options if your condition(s) worsen?: Yes-patient verbalized understanding    SIGNATURE Julian Lemmings, LPN [facility] Nurse Health Advisor Direct Dial [phone]

## 2023-10-29 NOTE — Progress Notes (Unsigned)
 Established patient visit  Patient: Henry Owens   DOB: 01-Jun-1960   63 y.o. Male  MRN: [id number] Visit Date: 10/29/2023  Today's healthcare provider: Jolynn Spencer, PA-C   Chief Complaint  Patient presents with   Hospitalization Follow-up    Patient was seen at [facility] on 10/27/23 patient reports he is feeling okay and that concern has not been resolved. Patient and spouse were advised by attending that he may need another MRI.   Medical Management of Chronic Issues   Hypertension    143/93 around 11:30 am    Hyperlipidemia   Subjective     HPI     Hospitalization Follow-up    Additional comments: Patient was seen at [facility] on 10/27/23 patient reports he is feeling okay and that concern has not been resolved. Patient and spouse were advised by attending that he may need another MRI.        Hypertension    Additional comments: 143/93 around 11:30 am       Last edited by Lilian Fitzpatrick, CMA on 10/29/2023  1:38 PM.       Discussed the use of AI scribe software for clinical note transcription with the patient, who gave verbal consent to proceed.  History of Present Illness Henry Owens is a 63 year old male who presents with a recent episode of near syncope and concerns about sleep apnea management.  He experienced a recent episode of near syncope characterized by leg vibrations, inability to communicate, and distorted vision. A CT scan during his hospital stay was normal. He is scheduled to see a neurologist this Wednesday  He has sleep apnea and previously discontinued CPAP use due to discomfort. He has not had a sleep study in two years and is not currently using any treatment for sleep apnea.  He takes atenolol  25 mg for blood pressure, which was normal during the visit. He has not monitored his blood pressure at home since discharge. Previous medications lisinopril  and spironolactone were changed due to side effects.  He takes fenofibrate  for  cholesterol management and drinks no water  due to taste preference.  He experiences significant neck pain and numbness in his hand and arm, which he associates with his neck condition. An MRI was ordered by the physiatry department.  He takes venlafaxine  75 mg for depression and smokes, which contributes to his health issues. He has no current chest pain, shortness of breath, or rapid heart beating. He felt 'fuzzy' until the day before the visit, with no current dizziness.  Follow up Hospitalization  Patient was admitted to [facility] on 10/27/23 and discharged on 10/27/23. He was treated for presyncope/near fall, likely vasovagal syncope due to dehydration. Treatment for this included volume repletion. Telephone follow up was done on 10/29/2023 He reports satisfactory compliance with treatment. He reports this condition is improved.  ----------------------------------------------------------------------------------------- -      06/29/2023    1:09 PM 06/01/2023    1:55 PM 05/03/2023    2:19 PM  Depression screen PHQ 2/9  Decreased Interest 2 3 2   Down, Depressed, Hopeless 2 3 2   PHQ - 2 Score 4 6 4   Altered sleeping 3 3 2   Tired, decreased energy 3 3 2   Change in appetite 0 3 1  Feeling bad or failure about yourself  2 3 0  Trouble concentrating 2 2 0  Moving slowly or fidgety/restless 0 0 0  Suicidal thoughts 0 0 0  PHQ-9 Score 14 20 9   Difficult doing work/chores  Very difficult Somewhat difficult Somewhat difficult      06/29/2023    1:09 PM 06/01/2023    1:55 PM 05/03/2023    2:19 PM  GAD 7 : Generalized Anxiety Score  Nervous, Anxious, on Edge 3 3 2   Control/stop worrying 3 3 2   Worry too much - different things 3 3 2   Trouble relaxing 3 3 2   Restless 0 0 0  Easily annoyed or irritable 0 0 1  Afraid - awful might happen 0 3 0  Total GAD 7 Score 12 15 9   Anxiety Difficulty Very difficult Very difficult Somewhat difficult    Medications: Outpatient Medications Prior to Visit   Medication Sig   acetaminophen  (TYLENOL ) 500 MG tablet Take 1,000 mg by mouth in the morning, at noon, and at bedtime.   albuterol  (VENTOLIN  HFA) 108 (90 Base) MCG/ACT inhaler Inhale 2 puffs into the lungs every 6 (six) hours as needed for wheezing or shortness of breath.   aspirin  EC 81 MG tablet Take 81 mg by mouth daily. Swallow whole.   atorvastatin  (LIPITOR) 20 MG tablet Take 1 tablet (20 mg total) by mouth daily.   Cholecalciferol (D 1000) 25 MCG (1000 UT) capsule Take 1,000 Units by mouth daily.   cyanocobalamin  1000 MCG tablet Take 1 tablet (1,000 mcg total) by mouth daily.   fenofibrate  (TRICOR ) 48 MG tablet TAKE 1 TABLET(48 MG) BY MOUTH DAILY   fluticasone  (FLONASE ) 50 MCG/ACT nasal spray Place 1 spray into both nostrils daily as needed for allergies or rhinitis.   gabapentin  (NEURONTIN ) 300 MG capsule Take 3 capsules (900 mg total) by mouth 2 (two) times daily.   mometasone -formoterol  (DULERA ) 100-5 MCG/ACT AERO Inhale 2 puffs into the lungs 2 (two) times daily.   OXYGEN Inhale into the lungs. As needed Has not used in a year   Pancrelipase , Lip-Prot-Amyl, (CREON ) 24000-76000 units CPEP Take 1 capsule (24,000 Units total) by mouth 3 (three) times daily before meals.   pantoprazole  (PROTONIX ) 40 MG tablet TAKE 1 TABLET(40 MG) BY MOUTH DAILY   Spacer/Aero-Holding Raguel FRENCH Use as directed for inhaler use   tiZANidine  (ZANAFLEX ) 4 MG tablet Take 4 mg by mouth 3 (three) times daily.   venlafaxine  XR (EFFEXOR  XR) 75 MG 24 hr capsule Take 1 capsule (75 mg total) by mouth daily with breakfast.   No facility-administered medications prior to visit.    Review of Systems All negative Except see HPI       Objective    BP 119/83 (BP Location: Right Arm, Patient Position: Sitting, Cuff Size: Normal)   Pulse 82   Ht 5' 10 (1.778 m)   Wt 195 lb 8 oz (88.7 kg)   SpO2 96%   BMI 28.05 kg/m     Physical Exam Vitals reviewed.  Constitutional:      General: He is not in acute  distress.    Appearance: Normal appearance. He is not diaphoretic.  HENT:     Head: Normocephalic and atraumatic.  Eyes:     General: No scleral icterus.    Conjunctiva/sclera: Conjunctivae normal.  Cardiovascular:     Rate and Rhythm: Normal rate and regular rhythm.     Pulses: Normal pulses.     Heart sounds: Normal heart sounds. No murmur heard. Pulmonary:     Effort: Pulmonary effort is normal. No respiratory distress.     Breath sounds: Normal breath sounds. No wheezing or rhonchi.  Musculoskeletal:     Cervical back: Neck supple.  Right lower leg: No edema.     Left lower leg: No edema.  Lymphadenopathy:     Cervical: No cervical adenopathy.  Skin:    General: Skin is warm and dry.     Findings: No rash.  Neurological:     Mental Status: He is alert and oriented to person, place, and time. Mental status is at baseline.  Psychiatric:        Mood and Affect: Mood normal.        Behavior: Behavior normal.      No results found for any visits on 10/29/23.      Assessment & Plan Hospital discharge follow-up  preSyncope/near fall Per ED, most likely vasovagal syncope possibly linked to dehydration  untreated sleep apnea could contribute. Weakness in his legs/problems with speaking : CT scan negative. Neurologist appointment scheduled. - Follow up with neurologist on Wednesday. Dehydration Inadequate hydration contributing to dizziness and hypotension. Dislikes water  taste. - Encourage hydration by adding fruits to water  or boiling fruits for flavor. Follow-up  Sleep Apnea Sleep apnea unmanaged due to CPAP discomfort. Alternatives suggested to improve compliance and reduce syncope risk. - Contact sleep center for CPAP mask adjustment or alternative options like nasal cannula. Will follow-up  Neck Pain Significant neck pain, possibly linked to neurological symptoms. MRI ordered, potential neurosurgical consultation. - Await MRI results. - Follow up with  neurosurgeon as needed.  Hypertension Chronic Currently not taking any medication  Previously well-controlled on atenolol  25 mg. Monitor for potential increase. - Monitor blood pressure at home for 2-4 weeks. - If blood pressure =130 mmHg, start atenolol  at half dosage and adjust as needed. - Schedule follow-up in 2-4 weeks. Patient was instructed to hold off on tadalafil /sildenafil . Patient seems to be confused if he is taking sildenafil  versus tadalafil .  Hyperlipidemia Chronic and previously stable  Continue fenofibrate  48 Mg and statins/atorvastatin  20 Mg. Continue low-cholesterol diet and exercise as tolerated Will follow-up  General Health Maintenance Smoker, contributing to health issues. - Encourage smoking cessation.  Follow-up Plans to monitor blood pressure and other health concerns. - Schedule follow-up appointment in 2-4 weeks. - Get on cancellation list for earlier appointment if needed. - Send blood pressure readings to provider.    No orders of the defined types were placed in this encounter.   No follow-ups on file.   The patient was advised to call back or seek an in-person evaluation if the symptoms worsen or if the condition fails to improve as anticipated.  I discussed the assessment and treatment plan with the patient. The patient was provided an opportunity to ask questions and all were answered. The patient agreed with the plan and demonstrated an understanding of the instructions.  I, Amirra Herling, PA-C have reviewed all documentation for this visit. The documentation on 10/29/2023  for the exam, diagnosis, procedures, and orders are all accurate and complete.  Jolynn Spencer, [facility], MMS [facility] [address] [phone] (phone) [phone] (fax)  [facility]

## 2023-10-30 ENCOUNTER — Telehealth: Payer: Self-pay

## 2023-10-30 NOTE — Telephone Encounter (Signed)
 Copied from CRM [id number]. 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 Please send a message through MyChart if there are any questions.  Thanks

## 2023-10-31 ENCOUNTER — Encounter: Payer: Self-pay | Admitting: Physician Assistant

## 2023-10-31 ENCOUNTER — Ambulatory Visit: Payer: Self-pay | Admitting: Physician Assistant

## 2023-10-31 DIAGNOSIS — G8929 Other chronic pain: Secondary | ICD-10-CM | POA: Diagnosis not present

## 2023-10-31 DIAGNOSIS — R202 Paresthesia of skin: Secondary | ICD-10-CM | POA: Diagnosis not present

## 2023-10-31 DIAGNOSIS — R5383 Other fatigue: Secondary | ICD-10-CM | POA: Diagnosis not present

## 2023-10-31 DIAGNOSIS — R531 Weakness: Secondary | ICD-10-CM | POA: Diagnosis not present

## 2023-10-31 DIAGNOSIS — R253 Fasciculation: Secondary | ICD-10-CM | POA: Diagnosis not present

## 2023-10-31 DIAGNOSIS — G479 Sleep disorder, unspecified: Secondary | ICD-10-CM | POA: Diagnosis not present

## 2023-10-31 DIAGNOSIS — R55 Syncope and collapse: Secondary | ICD-10-CM | POA: Diagnosis not present

## 2023-10-31 DIAGNOSIS — R200 Anesthesia of skin: Secondary | ICD-10-CM | POA: Diagnosis not present

## 2023-10-31 DIAGNOSIS — Z1331 Encounter for screening for depression: Secondary | ICD-10-CM | POA: Diagnosis not present

## 2023-10-31 DIAGNOSIS — M542 Cervicalgia: Secondary | ICD-10-CM | POA: Diagnosis not present

## 2023-10-31 DIAGNOSIS — M545 Low back pain, unspecified: Secondary | ICD-10-CM | POA: Diagnosis not present

## 2023-11-01 ENCOUNTER — Ambulatory Visit
Admission: RE | Admit: 2023-11-01 | Discharge: 2023-11-01 | Disposition: A | Discharge status: Home / Self Care | Source: Ambulatory Visit | Attending: Family Medicine | Admitting: Family Medicine

## 2023-11-01 DIAGNOSIS — M5412 Radiculopathy, cervical region: Secondary | ICD-10-CM

## 2023-11-01 DIAGNOSIS — M4802 Spinal stenosis, cervical region: Secondary | ICD-10-CM | POA: Diagnosis not present

## 2023-11-01 DIAGNOSIS — M47812 Spondylosis without myelopathy or radiculopathy, cervical region: Secondary | ICD-10-CM | POA: Diagnosis not present

## 2023-11-02 LAB — CULTURE, BLOOD (ROUTINE X 2)
Culture: NO GROWTH
Culture: NO GROWTH
Special Requests: ADEQUATE
Special Requests: ADEQUATE

## 2023-11-03 ENCOUNTER — Other Ambulatory Visit: Payer: Self-pay | Admitting: Physician Assistant

## 2023-11-03 DIAGNOSIS — J441 Chronic obstructive pulmonary disease with (acute) exacerbation: Secondary | ICD-10-CM

## 2023-11-05 DIAGNOSIS — M542 Cervicalgia: Secondary | ICD-10-CM | POA: Diagnosis not present

## 2023-11-05 NOTE — Telephone Encounter (Signed)
 Requested Prescriptions  Pending Prescriptions Disp Refills   fenofibrate  (TRICOR ) 48 MG tablet [Pharmacy Med Name: FENOFIBRATE  48MG  TABLETS] 90 tablet 1    Sig: TAKE 1 TABLET(48 MG) BY MOUTH DAILY     Cardiovascular:  Antilipid - Fibric Acid Derivatives Failed - 11/05/2023  5:43 PM      Failed - Cr in normal range and within 360 days    Creatinine, Ser  Date Value Ref Range Status  10/27/2023 1.56 (H) 0.61 - 1.24 mg/dL Final         Failed - Lipid Panel in normal range within the last 12 months    Cholesterol, Total  Date Value Ref Range Status  08/03/2023 213 (H) 100 - 199 mg/dL Final   LDL Chol Calc (NIH)  Date Value Ref Range Status  08/03/2023 112 (H) 0 - 99 mg/dL Final   Direct LDL  Date Value Ref Range Status  11/28/2022 55 0 - 99 mg/dL Final    Comment:    Performed at [facility], [address], [place], [place] [address]   HDL  Date Value Ref Range Status  08/03/2023 24 (L) >39 mg/dL Final   Triglycerides  Date Value Ref Range Status  08/03/2023 443 (H) 0 - 149 mg/dL Final         Passed - ALT in normal range and within 360 days    ALT  Date Value Ref Range Status  10/27/2023 18 0 - 44 U/L Final         Passed - AST in normal range and within 360 days    AST  Date Value Ref Range Status  10/27/2023 18 15 - 41 U/L Final         Passed - HGB in normal range and within 360 days    Hemoglobin  Date Value Ref Range Status  10/27/2023 13.3 13.0 - 17.0 g/dL Final  97/96/7974 84.5 13.0 - 17.7 g/dL Final         Passed - HCT in normal range and within 360 days    HCT  Date Value Ref Range Status  10/27/2023 40.7 39.0 - 52.0 % Final   Hematocrit  Date Value Ref Range Status  05/28/2023 46.6 37.5 - 51.0 % Final         Passed - PLT in normal range and within 360 days    Platelets  Date Value Ref Range Status  10/27/2023 179 150 - 400 K/uL Final  05/28/2023 219 150 - 450 x10E3/uL Final         Passed - WBC in normal range and within 360  days    WBC  Date Value Ref Range Status  10/27/2023 8.6 4.0 - 10.5 K/uL Final         Passed - eGFR is 30 or above and within 360 days    GFR, Estimated  Date Value Ref Range Status  10/27/2023 50 (L) >60 mL/min Final    Comment:    (NOTE) Calculated using the CKD-EPI Creatinine Equation (2021)    eGFR  Date Value Ref Range Status  05/28/2023 63 >59 mL/min/1.73 Final         Passed - Valid encounter within last 12 months    Recent Outpatient Visits           1 week ago Mixed hyperlipidemia   [facility] [place], [place], PA-C   4 months ago [facility] [place], [place], PA-C  5 months ago Annual physical exam   [facility] [place], [place], [place]       Future Appointments             In 2 months McGowan, Clotilda DELENA RIGGERS [facility]

## 2023-11-08 ENCOUNTER — Other Ambulatory Visit: Payer: Self-pay | Admitting: Physician Assistant

## 2023-11-08 DIAGNOSIS — F32A Depression, unspecified: Secondary | ICD-10-CM

## 2023-11-12 ENCOUNTER — Encounter: Payer: Self-pay | Admitting: Physician Assistant

## 2023-11-12 ENCOUNTER — Ambulatory Visit: Admitting: Physician Assistant

## 2023-11-12 VITALS — BP 138/80 | HR 65 | Resp 16 | Ht 67.0 in | Wt 195.0 lb

## 2023-11-12 DIAGNOSIS — G4739 Other sleep apnea: Secondary | ICD-10-CM

## 2023-11-12 DIAGNOSIS — M542 Cervicalgia: Secondary | ICD-10-CM | POA: Diagnosis not present

## 2023-11-12 DIAGNOSIS — F339 Major depressive disorder, recurrent, unspecified: Secondary | ICD-10-CM | POA: Diagnosis not present

## 2023-11-12 DIAGNOSIS — I10 Essential (primary) hypertension: Secondary | ICD-10-CM | POA: Diagnosis not present

## 2023-11-12 MED ORDER — VENLAFAXINE HCL ER 75 MG PO CP24: 75.0000 mg | ORAL_CAPSULE | Freq: Every day | ORAL | 1 refills | Status: DC

## 2023-11-12 NOTE — Progress Notes (Unsigned)
 Established patient visit  Patient: Henry Owens   DOB: Feb 05, 1961   63 y.o. Male  MRN: [id number] Visit Date: 11/12/2023  Today's healthcare provider: Jolynn Spencer, PA-C   Chief Complaint  Patient presents with   Follow-up    F/u and CPAP   Subjective     HPI     Follow-up    Additional comments: F/u and CPAP      Last edited by Marylen Odella CROME, CMA on 11/12/2023  1:09 PM.       Discussed the use of AI scribe software for clinical note transcription with the patient, who gave verbal consent to proceed.  History of Present Illness Henry Owens is a 63 year old male with hypertension and sleep apnea who presents for medication management and follow-up on sleep study results.  Hypertension remains in the first stage despite medication. He is not taking tadalafil  or sildenafil .  He has undergone a sleep study for sleep apnea but has not been contacted for follow-up. He previously returned a CPAP machine sent two years ago.  Current medications include nortriptyline, Effexor , and Depakote 125 mg twice daily for neurological pain. Effexor  was recently reduced from 225 mg to 75 mg during a hospital stay.  He smokes a little over a pack a day and is scheduled for a lung test due to potential COPD. He has a history of syncope.       06/29/2023    1:09 PM 06/01/2023    1:55 PM 05/03/2023    2:19 PM  Depression screen PHQ 2/9  Decreased Interest 2 3 2   Down, Depressed, Hopeless 2 3 2   PHQ - 2 Score 4 6 4   Altered sleeping 3 3 2   Tired, decreased energy 3 3 2   Change in appetite 0 3 1  Feeling bad or failure about yourself  2 3 0  Trouble concentrating 2 2 0  Moving slowly or fidgety/restless 0 0 0  Suicidal thoughts 0 0 0  PHQ-9 Score 14 20 9   Difficult doing work/chores Very difficult Somewhat difficult Somewhat difficult      06/29/2023    1:09 PM 06/01/2023    1:55 PM 05/03/2023    2:19 PM  GAD 7 : Generalized Anxiety Score  Nervous, Anxious, on  Edge 3 3 2   Control/stop worrying 3 3 2   Worry too much - different things 3 3 2   Trouble relaxing 3 3 2   Restless 0 0 0  Easily annoyed or irritable 0 0 1  Afraid - awful might happen 0 3 0  Total GAD 7 Score 12 15 9   Anxiety Difficulty Very difficult Very difficult Somewhat difficult    Medications: Outpatient Medications Prior to Visit  Medication Sig   acetaminophen  (TYLENOL ) 500 MG tablet Take 1,000 mg by mouth in the morning, at noon, and at bedtime.   albuterol  (VENTOLIN  HFA) 108 (90 Base) MCG/ACT inhaler Inhale 2 puffs into the lungs every 6 (six) hours as needed for wheezing or shortness of breath.   aspirin  EC 81 MG tablet Take 81 mg by mouth daily. Swallow whole.   atorvastatin  (LIPITOR) 20 MG tablet Take 1 tablet (20 mg total) by mouth daily.   Cholecalciferol (D 1000) 25 MCG (1000 UT) capsule Take 1,000 Units by mouth daily.   cyanocobalamin  1000 MCG tablet Take 1 tablet (1,000 mcg total) by mouth daily.   fenofibrate  (TRICOR ) 48 MG tablet TAKE 1 TABLET(48 MG) BY MOUTH DAILY   fluticasone  (FLONASE ) 50 MCG/ACT  nasal spray Place 1 spray into both nostrils daily as needed for allergies or rhinitis.   gabapentin  (NEURONTIN ) 300 MG capsule Take 3 capsules (900 mg total) by mouth 2 (two) times daily.   mometasone -formoterol  (DULERA ) 100-5 MCG/ACT AERO Inhale 2 puffs into the lungs 2 (two) times daily.   OXYGEN Inhale into the lungs. As needed Has not used in a year   Pancrelipase , Lip-Prot-Amyl, (CREON ) 24000-76000 units CPEP Take 1 capsule (24,000 Units total) by mouth 3 (three) times daily before meals.   pantoprazole  (PROTONIX ) 40 MG tablet TAKE 1 TABLET(40 MG) BY MOUTH DAILY   Spacer/Aero-Holding Raguel FRENCH Use as directed for inhaler use   tiZANidine  (ZANAFLEX ) 4 MG tablet Take 4 mg by mouth 3 (three) times daily.   venlafaxine  XR (EFFEXOR  XR) 75 MG 24 hr capsule Take 1 capsule (75 mg total) by mouth daily with breakfast.   venlafaxine  XR (EFFEXOR -XR) 150 MG 24 hr capsule  TAKE 1 CAPSULE(150 MG) BY MOUTH DAILY WITH BREAKFAST   No facility-administered medications prior to visit.    Review of Systems All negative Except see HPI       Objective    BP 138/80 (BP Location: Left Arm, Patient Position: Sitting, Cuff Size: Normal)   Pulse 65   Resp 16   Ht 5' 7 (1.702 m)   Wt 195 lb (88.5 kg)   SpO2 98%   BMI 30.54 kg/m     Physical Exam Vitals reviewed.  Constitutional:      General: He is not in acute distress.    Appearance: Normal appearance. He is not diaphoretic.  HENT:     Head: Normocephalic and atraumatic.  Eyes:     General: No scleral icterus.    Conjunctiva/sclera: Conjunctivae normal.  Cardiovascular:     Rate and Rhythm: Normal rate and regular rhythm.     Pulses: Normal pulses.     Heart sounds: Normal heart sounds. No murmur heard. Pulmonary:     Effort: Pulmonary effort is normal. No respiratory distress.     Breath sounds: Normal breath sounds. No wheezing or rhonchi.  Musculoskeletal:     Cervical back: Neck supple.     Right lower leg: No edema.     Left lower leg: No edema.  Lymphadenopathy:     Cervical: No cervical adenopathy.  Skin:    General: Skin is warm and dry.     Findings: No rash.  Neurological:     Mental Status: He is alert and oriented to person, place, and time. Mental status is at baseline.  Psychiatric:        Mood and Affect: Mood normal.        Behavior: Behavior normal.      No results found for any visits on 11/12/23.      Assessment and Plan Assessment & Plan Hypertension Chronic Blood pressure remains in stage 1 hypertension despite medication. Goal to reduce to elevated level to prevent ocular and renal damage. Gradual stabilization emphasized to avoid cerebral vessel effects. - Instruct to take entire tablet of blood pressure medication. - Monitor blood pressure regularly. - Consider adding another medication if blood pressure remains above 130/80 mmHg. - Evaluate need for  referral to pharmacist for medication management if blood pressure remains uncontrolled.  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 Neurosurgeon recommended facet injections. On Depakote and nortriptyline for pain management, requiring careful monitoring. - Continue Depakote and nortriptyline as prescribed. - Obtain records  from neurosurgeon for further evaluation. - Consider facet injections as recommended by neurosurgeon?SABRA There is no note from ortho surgeon? M.D.  Depression Chronic On nortriptyline and Effexor  for depression. Importance of monitoring due to combination of medications and potential side effects discussed. - Prescribe Effexor  75 mg. - Monitor for side effects and efficacy of antidepressant therapy. Advised to bring all his mediations to the next appointment for any medication change. Neurology prescribed Depakote, it is not on his list.  Obstructive Sleep Apnea Chronic Completed sleep study. Referral to pulmonologist for CPAP setup planned. - Refer to pulmonologist specializing in sleep studies for CPAP machine setup.  Tobacco Use Disorder Chronic Smokes over a pack daily. Importance of reducing smoking for overall health and potential future medical procedures discussed. - Encourage gradual reduction in smoking. - Discuss smoking cessation aids such as patches and gum.  General Health Maintenance Due for tetanus vaccination. - Administer tetanus vaccination.  Follow-up Plans to see him in one month to six weeks. Advised to bring all medications to next visit for review. - Schedule follow-up appointment in 1-2 months. - Bring all medications to next visit for review. - Consider referral to pharmacist for medication management if needed.    No orders of the defined types were placed in this encounter.   No follow-ups on file.   The patient was advised to call back or seek an in-person evaluation  if the symptoms worsen or if the condition fails to improve as anticipated.  I discussed the assessment and treatment plan with the patient. The patient was provided an opportunity to ask questions and all were answered. The patient agreed with the plan and demonstrated an understanding of the instructions.  I, Fillmore Bynum, PA-C have reviewed all documentation for this visit. The documentation on 11/12/2023  for the exam, diagnosis, procedures, and orders are all accurate and complete.  Jolynn Spencer, [facility], MMS [facility] [phone] (phone) [phone] (fax)  [facility]

## 2023-11-13 ENCOUNTER — Telehealth: Payer: Self-pay | Admitting: Acute Care

## 2023-11-13 ENCOUNTER — Encounter: Payer: Self-pay | Admitting: Physician Assistant

## 2023-11-13 DIAGNOSIS — F17210 Nicotine dependence, cigarettes, uncomplicated: Secondary | ICD-10-CM

## 2023-11-13 DIAGNOSIS — F339 Major depressive disorder, recurrent, unspecified: Secondary | ICD-10-CM | POA: Insufficient documentation

## 2023-11-13 DIAGNOSIS — Z87891 Personal history of nicotine dependence: Secondary | ICD-10-CM

## 2023-11-13 DIAGNOSIS — G4739 Other sleep apnea: Secondary | ICD-10-CM | POA: Insufficient documentation

## 2023-11-13 DIAGNOSIS — M542 Cervicalgia: Secondary | ICD-10-CM | POA: Insufficient documentation

## 2023-11-13 DIAGNOSIS — Z122 Encounter for screening for malignant neoplasm of respiratory organs: Secondary | ICD-10-CM

## 2023-11-13 NOTE — Telephone Encounter (Signed)
 Lung Cancer Screening Narrative/Criteria Questionnaire (Cigarette Smokers Only- No Cigars/Pipes/vapes)   Henry Owens   SDMV:11/16/2023 11:30a [name]        08/15/1960   LDCT: 11/23/2023 1:00p [facility] [place]     63 y.o.   Phone: [phone]  Lung Screening Narrative (confirm age 52-77 yrs Medicare / 50-80 yrs Private pay insurance)   Insurance information:MCD   Referring Provider:Dr. Maree - hospitalist    This screening involves an initial phone call with a team member from our program. It is called a shared decision making visit. The initial meeting is required by  insurance and Medicare to make sure you understand the program. This appointment takes about 15-20 minutes to complete. You will complete the screening scan at your scheduled date/time.  This scan takes about 5-10 minutes to complete. You can eat and drink normally before and after the scan.  Criteria questions for Lung Cancer Screening:   Are you a current or former smoker? Current Age began smoking: 63yo   If you are a former smoker, what year did you quit smoking? Quit a couple times totaling 3 years but started back again (within 15 yrs)   To calculate your smoking history, I need an accurate estimate of how many packs of cigarettes you smoked per day and for how many years. (Not just the number of PPD you are now smoking)   Years smoking 41 x Packs per day 2 = Pack years 82   (at least 20 pack yrs)   (Make sure they understand that we need to know how much they have smoked in the past, not just the number of PPD they are smoking now)  Do you have a personal history of cancer?  No    Do you have a family history of cancer? No  Are you coughing up blood?  No  Have you had unexplained weight loss of 15 lbs or more in the last 6 months? No  It looks like you meet all criteria.  When would be a good time for us  to schedule you for this screening?   Additional information: N/A

## 2023-11-15 ENCOUNTER — Telehealth: Payer: Self-pay | Admitting: Physician Assistant

## 2023-11-15 NOTE — Telephone Encounter (Unsigned)
 Copied from CRM [id number]. Topic: Clinical - Medical Advice >> Nov 15, 2023  3:39 PM Tiffini S wrote: Reason for CRM: Patient called asking for Dr. Oneil Priestly, MD notes to be sent to the podiatry department [phone] for his neck and MRI. Patient is asking for a call back for a update at [phone].

## 2023-11-16 ENCOUNTER — Encounter: Admitting: Adult Health

## 2023-11-16 ENCOUNTER — Ambulatory Visit: Admit: 2023-11-16 | Payer: Medicaid Other

## 2023-11-16 SURGERY — COLONOSCOPY WITH PROPOFOL
Anesthesia: General

## 2023-11-19 ENCOUNTER — Other Ambulatory Visit: Payer: Self-pay

## 2023-11-19 ENCOUNTER — Ambulatory Visit
Admission: RE | Admit: 2023-11-19 | Discharge: 2023-11-19 | Disposition: A | Discharge status: Home / Self Care | Attending: Gastroenterology | Admitting: Gastroenterology

## 2023-11-19 ENCOUNTER — Ambulatory Visit: Admitting: Anesthesiology

## 2023-11-19 ENCOUNTER — Encounter
Admission: RE | Disposition: A | Discharge status: Home / Self Care | Payer: Self-pay | Source: Home / Self Care | Attending: Gastroenterology

## 2023-11-19 DIAGNOSIS — E785 Hyperlipidemia, unspecified: Secondary | ICD-10-CM | POA: Diagnosis not present

## 2023-11-19 DIAGNOSIS — Z6830 Body mass index (BMI) 30.0-30.9, adult: Secondary | ICD-10-CM | POA: Insufficient documentation

## 2023-11-19 DIAGNOSIS — K640 First degree hemorrhoids: Secondary | ICD-10-CM | POA: Diagnosis not present

## 2023-11-19 DIAGNOSIS — I10 Essential (primary) hypertension: Secondary | ICD-10-CM | POA: Diagnosis not present

## 2023-11-19 DIAGNOSIS — F419 Anxiety disorder, unspecified: Secondary | ICD-10-CM | POA: Insufficient documentation

## 2023-11-19 DIAGNOSIS — G4733 Obstructive sleep apnea (adult) (pediatric): Secondary | ICD-10-CM | POA: Diagnosis not present

## 2023-11-19 DIAGNOSIS — Z8673 Personal history of transient ischemic attack (TIA), and cerebral infarction without residual deficits: Secondary | ICD-10-CM | POA: Diagnosis not present

## 2023-11-19 DIAGNOSIS — F32A Depression, unspecified: Secondary | ICD-10-CM | POA: Diagnosis not present

## 2023-11-19 DIAGNOSIS — Z860100 Personal history of colon polyps, unspecified: Secondary | ICD-10-CM | POA: Diagnosis not present

## 2023-11-19 DIAGNOSIS — Z1211 Encounter for screening for malignant neoplasm of colon: Secondary | ICD-10-CM | POA: Diagnosis not present

## 2023-11-19 DIAGNOSIS — J449 Chronic obstructive pulmonary disease, unspecified: Secondary | ICD-10-CM | POA: Insufficient documentation

## 2023-11-19 DIAGNOSIS — Z09 Encounter for follow-up examination after completed treatment for conditions other than malignant neoplasm: Secondary | ICD-10-CM | POA: Diagnosis not present

## 2023-11-19 DIAGNOSIS — K649 Unspecified hemorrhoids: Secondary | ICD-10-CM | POA: Diagnosis not present

## 2023-11-19 DIAGNOSIS — Z860101 Personal history of adenomatous and serrated colon polyps: Secondary | ICD-10-CM | POA: Diagnosis not present

## 2023-11-19 DIAGNOSIS — E66813 Obesity, class 3: Secondary | ICD-10-CM | POA: Diagnosis not present

## 2023-11-19 DIAGNOSIS — Z7951 Long term (current) use of inhaled steroids: Secondary | ICD-10-CM | POA: Diagnosis not present

## 2023-11-19 DIAGNOSIS — Z7982 Long term (current) use of aspirin: Secondary | ICD-10-CM | POA: Diagnosis not present

## 2023-11-19 DIAGNOSIS — Z800 Family history of malignant neoplasm of digestive organs: Secondary | ICD-10-CM | POA: Diagnosis not present

## 2023-11-19 DIAGNOSIS — Z79899 Other long term (current) drug therapy: Secondary | ICD-10-CM | POA: Diagnosis not present

## 2023-11-19 DIAGNOSIS — K219 Gastro-esophageal reflux disease without esophagitis: Secondary | ICD-10-CM | POA: Insufficient documentation

## 2023-11-19 HISTORY — PX: COLONOSCOPY: SHX5424

## 2023-11-19 SURGERY — COLONOSCOPY
Anesthesia: General

## 2023-11-19 MED ORDER — SODIUM CHLORIDE 0.9 % IV SOLN: INTRAVENOUS | Status: DC

## 2023-11-19 MED ORDER — PROPOFOL 10 MG/ML IV BOLUS
INTRAVENOUS | Status: DC | PRN
  Administered 2023-11-19: 40 mg via INTRAVENOUS
  Administered 2023-11-19: 80 mg via INTRAVENOUS

## 2023-11-19 MED ORDER — PROPOFOL 500 MG/50ML IV EMUL
INTRAVENOUS | Status: DC | PRN
Start: 2023-11-19 — End: 2023-11-19
  Administered 2023-11-19: 140 ug/kg/min via INTRAVENOUS

## 2023-11-19 NOTE — H&P (Signed)
 Outpatient short stay form Pre-procedure 11/19/2023  Henry ONEIDA Schick, MD  Primary Physician: Ostwalt, Janna, PA-C  Reason for visit:  Surveillance  History of present illness:    63 y/o gentleman with history of COPD, hypertension, and OSA here for colonoscopy for history of removal of large polyp. No blood thinners except aspirin . Mother had colon cancer at unknown age. History of appendectomy.     Current Facility-Administered Medications:    0.9 %  sodium chloride  infusion, , Intravenous, Continuous, Jyoti Harju, Henry ONEIDA, MD  Medications Prior to Admission  Medication Sig Dispense Refill Last Dose/Taking   aspirin  EC 81 MG tablet Take 81 mg by mouth daily. Swallow whole.   11/18/2023   atorvastatin  (LIPITOR) 20 MG tablet Take 1 tablet (20 mg total) by mouth daily. 90 tablet 3 11/18/2023   Cholecalciferol (D 1000) 25 MCG (1000 UT) capsule Take 1,000 Units by mouth daily.   Past Week   cyanocobalamin  1000 MCG tablet Take 1 tablet (1,000 mcg total) by mouth daily. 30 tablet 0 Past Week   fenofibrate  (TRICOR ) 48 MG tablet TAKE 1 TABLET(48 MG) BY MOUTH DAILY 90 tablet 1 11/18/2023   fluticasone  (FLONASE ) 50 MCG/ACT nasal spray Place 1 spray into both nostrils daily as needed for allergies or rhinitis.   11/18/2023   gabapentin  (NEURONTIN ) 300 MG capsule Take 3 capsules (900 mg total) by mouth 2 (two) times daily.   11/18/2023   OXYGEN Inhale into the lungs. As needed Has not used in a year   Past Month   pantoprazole  (PROTONIX ) 40 MG tablet TAKE 1 TABLET(40 MG) BY MOUTH DAILY 90 tablet 1 11/18/2023   tiZANidine  (ZANAFLEX ) 4 MG tablet Take 4 mg by mouth 3 (three) times daily.   11/18/2023   venlafaxine  XR (EFFEXOR  XR) 75 MG 24 hr capsule Take 1 capsule (75 mg total) by mouth daily with breakfast. 90 capsule 1 11/18/2023   acetaminophen  (TYLENOL ) 500 MG tablet Take 1,000 mg by mouth in the morning, at noon, and at bedtime.      albuterol  (VENTOLIN  HFA) 108 (90 Base) MCG/ACT inhaler Inhale 2 puffs  into the lungs every 6 (six) hours as needed for wheezing or shortness of breath. 8 g 2    Spacer/Aero-Holding Raguel FRENCH Use as directed for inhaler use 1 each 0      Allergies  Allergen Reactions   Wound Dressing Adhesive Other (See Comments)    BLISTER   Azithromycin  Rash and Dermatitis   Penicillin G Rash    Has patient had a PCN reaction causing immediate rash, facial/tongue/throat swelling, SOB or lightheadedness with hypotension: Yes Has patient had a PCN reaction causing severe rash involving mucus membranes or skin necrosis: No Has patient had a PCN reaction that required hospitalization: No Has patient had a PCN reaction occurring within the last 10 years: No If all of the above answers are NO, then may proceed with Cephalosporin use.      Past Medical History:  Diagnosis Date   Allergy 2020   Anxiety    Arthritis    Blood transfusion without reported diagnosis 11/2022   Breast cancer (HCC)    COPD (chronic obstructive pulmonary disease) (HCC) 2023   Depression    Emphysema of lung (HCC)    Flu 07/2017   GERD (gastroesophageal reflux disease) 2023   Hyperlipidemia    Hypertension    Intractable nausea and vomiting 12/11/2022   Nodule of right lung    Oxygen deficiency 2023   Only when needed  Sleep apnea 2024   Stroke [facility]) 11/2022   Tinnitus     Review of systems:  Otherwise negative.    Physical Exam  Gen: Alert, oriented. Appears stated age.  HEENT: PERRLA. Lungs: No respiratory distress CV: RRR Abd: soft, benign, no masses Ext: No edema    Planned procedures: Proceed with colonoscopy. The patient understands the nature of the planned procedure, indications, risks, alternatives and potential complications including but not limited to bleeding, infection, perforation, damage to internal organs and possible oversedation/side effects from anesthesia. The patient agrees and gives consent to proceed.  Please refer to procedure notes for findings,  recommendations and patient disposition/instructions.     Henry ONEIDA Schick, MD [facility]

## 2023-11-19 NOTE — Interval H&P Note (Signed)
 History and Physical Interval Note:  11/19/2023 7:41 AM  Henry Owens  has presented today for surgery, with the diagnosis of h/o polyps.  The various methods of treatment have been discussed with the patient and family. After consideration of risks, benefits and other options for treatment, the patient has consented to  Procedure(s): COLONOSCOPY (N/A) as a surgical intervention.  The patient's history has been reviewed, patient examined, no change in status, stable for surgery.  I have reviewed the patient's chart and labs.  Questions were answered to the patient's satisfaction.     Ole ONEIDA Schick  Ok to proceed with colonoscopy

## 2023-11-19 NOTE — Anesthesia Preprocedure Evaluation (Addendum)
 Anesthesia Evaluation  Patient identified by MRN, date of birth, ID band Patient awake    Reviewed: Allergy & Precautions, NPO status , Patient's Chart, lab work & pertinent test results  Airway Mallampati: II  TM Distance: >3 FB Neck ROM: Full    Dental  (+) Lower Dentures, Upper Dentures   Pulmonary sleep apnea , COPD,  COPD inhaler, Current Smoker and Patient abstained from smoking.    + decreased breath sounds      Cardiovascular Exercise Tolerance: Good hypertension, Pt. on medications Normal cardiovascular exam Rhythm:Regular Rate:Normal     Neuro/Psych   Anxiety Depression     Neuromuscular disease (R hand nueropathy with neck pain and radiculopathy) CVA, No Residual Symptoms  negative psych ROS   GI/Hepatic Neg liver ROS,GERD  Medicated,,  Endo/Other  negative endocrine ROS  Class 3 obesity  Renal/GU Renal InsufficiencyRenal disease  negative genitourinary   Musculoskeletal   Abdominal  (+) + obese  Peds negative pediatric ROS (+)  Hematology negative hematology ROS (+)   Anesthesia Other Findings Past Medical History: 2020: Allergy No date: Anxiety No date: Arthritis 11/2022: Blood transfusion without reported diagnosis 2023: COPD (chronic obstructive pulmonary disease) (HCC) No date: Depression No date: Emphysema of lung (HCC) 07/2017: Flu 2023: GERD (gastroesophageal reflux disease) No date: Hyperlipidemia No date: Hypertension 12/11/2022: Intractable nausea and vomiting No date: Nodule of right lung 2023: Oxygen deficiency     Comment:  Only when needed 2024: Sleep apnea 11/2022: Stroke (HCC) No date: Tinnitus  Past Surgical History: 1975: APPENDECTOMY No date: BACK SURGERY 08/20/2017: KNEE ARTHROSCOPY WITH MEDIAL MENISECTOMY; Right     Comment:  Procedure: KNEE ARTHROSCOPY WITH PARTIAL MEDIAL AND               LATERAL MENISECTOMY,PARTIAL SYNOVECTOMY,CHONDROPLASTY,               LOOSE BODY  REMOVAL;  Surgeon: Leora Lynwood SAUNDERS, MD;                Location: [facility] ORS;  Service: Orthopedics;  Laterality:               Right; 1975: KNEE SURGERY     Comment:  1996, 2000 9 1 89: SPINE SURGERY     Comment:  Last of multiple procedures,  limbar fusion  BMI    Body Mass Index: 28.64 kg/m      Reproductive/Obstetrics negative OB ROS                              Anesthesia Physical Anesthesia Plan  ASA: 3  Anesthesia Plan: General   Post-op Pain Management:    Induction: Intravenous  PONV Risk Score and Plan: Propofol  infusion and TIVA  Airway Management Planned: Natural Airway and Nasal Cannula  Additional Equipment:   Intra-op Plan:   Post-operative Plan:   Informed Consent: I have reviewed the patients History and Physical, chart, labs and discussed the procedure including the risks, benefits and alternatives for the proposed anesthesia with the patient or authorized representative who has indicated his/her understanding and acceptance.     Dental Advisory Given  Plan Discussed with: CRNA and Surgeon  Anesthesia Plan Comments:         Anesthesia Quick Evaluation

## 2023-11-19 NOTE — Anesthesia Postprocedure Evaluation (Signed)
 Anesthesia Post Note  Patient: Henry Owens  Procedure(s) Performed: COLONOSCOPY  Patient location during evaluation: Endoscopy Anesthesia Type: General Level of consciousness: awake and alert Pain management: pain level controlled Vital Signs Assessment: post-procedure vital signs reviewed and stable Respiratory status: spontaneous breathing, nonlabored ventilation and respiratory function stable Cardiovascular status: blood pressure returned to baseline and stable Postop Assessment: no apparent nausea or vomiting Anesthetic complications: no   No notable events documented.   Last Vitals:  Vitals:   11/19/23 0830 11/19/23 0833  BP: (!) 160/93 (!) 160/93  Pulse: 68   Resp: 16 17  Temp:    SpO2: 100%     Last Pain:  Vitals:   11/19/23 0809  TempSrc: Temporal  PainSc:                  [name]

## 2023-11-19 NOTE — Transfer of Care (Signed)
 Immediate Anesthesia Transfer of Care Note  Patient: Henry Owens  Procedure(s) Performed: COLONOSCOPY  Patient Location: PACU  Anesthesia Type:General  Level of Consciousness: awake, alert , and oriented  Airway & Oxygen Therapy: Patient Spontanous Breathing  Post-op Assessment: Report given to RN and Post -op Vital signs reviewed and stable  Post vital signs: Reviewed and stable  Last Vitals:  Vitals Value Taken Time  BP 134/82 11/19/23 08:09  Temp 35.8 C 11/19/23 08:09  Pulse 72 11/19/23 08:13  Resp 16 11/19/23 08:13  SpO2 95 % 11/19/23 08:13  Vitals shown include unfiled device data.  Last Pain:  Vitals:   11/19/23 0809  TempSrc: Temporal  PainSc:          Complications: No notable events documented.

## 2023-11-19 NOTE — Op Note (Signed)
 [facility] Patient Name: Henry Owens Procedure Date: 11/19/2023 7:27 AM MRN: [id number] Account #: [id number] Date of Birth: 1960/12/22 Admit Type: Outpatient Age: 63 Room: [facility] ENDO ROOM 3 Gender: Male Note Status: Finalized Instrument Name: Arvis [id number] Procedure:             Colonoscopy Indications:           Surveillance: Personal history of adenomatous polyps,                         inadequate prep on last colonoscopy (less than 1 year                         ago) Providers:             Ole Schick MD, MD Referring MD:          Ole Schick MD, MD (Referring MD) Medicines:             Monitored Anesthesia Care Complications:         No immediate complications. Procedure:             Pre-Anesthesia Assessment:                        - Prior to the procedure, a History and Physical was                         performed, and patient medications and allergies were                         reviewed. The patient is competent. The risks and                         benefits of the procedure and the sedation options and                         risks were discussed with the patient. All questions                         were answered and informed consent was obtained.                         Patient identification and proposed procedure were                         verified by the physician, the nurse, the                         anesthesiologist, the anesthetist and the technician                         in the endoscopy suite. Mental Status Examination:                         alert and oriented. Airway Examination: normal                         oropharyngeal airway and neck mobility. Respiratory  Examination: clear to auscultation. CV Examination:                         normal. Prophylactic Antibiotics: The patient does not                         require prophylactic antibiotics. Prior                          Anticoagulants: The patient has taken no anticoagulant                         or antiplatelet agents. ASA Grade Assessment: III - A                         patient with severe systemic disease. After reviewing                         the risks and benefits, the patient was deemed in                         satisfactory condition to undergo the procedure. The                         anesthesia plan was to use monitored anesthesia care                         (MAC). Immediately prior to administration of                         medications, the patient was re-assessed for adequacy                         to receive sedatives. The heart rate, respiratory                         rate, oxygen saturations, blood pressure, adequacy of                         pulmonary ventilation, and response to care were                         monitored throughout the procedure. The physical                         status of the patient was re-assessed after the                         procedure.                        After obtaining informed consent, the colonoscope was                         passed under direct vision. Throughout the procedure,                         the patient's blood pressure, pulse, and oxygen  saturations were monitored continuously. The                         Colonoscope was introduced through the anus and                         advanced to the the cecum, identified by appendiceal                         orifice and ileocecal valve. The colonoscopy was                         performed without difficulty. The patient tolerated                         the procedure well. The quality of the bowel                         preparation was good. The ileocecal valve, appendiceal                         orifice, and rectum were photographed. Findings:      The perianal and digital rectal examinations were normal.      Internal hemorrhoids were found during  retroflexion. The hemorrhoids       were Grade I (internal hemorrhoids that do not prolapse).      The exam was otherwise without abnormality on direct and retroflexion       views. Impression:            - Internal hemorrhoids.                        - The examination was otherwise normal on direct and                         retroflexion views.                        - No specimens collected. Recommendation:        - Discharge patient to home.                        - Resume previous diet.                        - Continue present medications.                        - Repeat colonoscopy in 3 years for surveillance.                        - Return to referring physician as previously                         scheduled. Procedure Code(s):     --- Professional ---                        H9894, Colorectal cancer screening; colonoscopy on  individual at high risk Diagnosis Code(s):     --- Professional ---                        Z86.010, Personal history of colonic polyps                        K64.0, First degree hemorrhoids CPT copyright 2022 American [employer] Association. All rights reserved. The codes documented in this report are preliminary and upon coder review may  be revised to meet current compliance requirements. Ole Schick MD, MD 11/19/2023 8:09:29 AM Number of Addenda: 0 Note Initiated On: 11/19/2023 7:27 AM Scope Withdrawal Time: 0 hours 9 minutes 0 seconds  Total Procedure Duration: 0 hours 12 minutes 7 seconds  Estimated Blood Loss:  Estimated blood loss: none.      [facility]

## 2023-11-20 ENCOUNTER — Encounter: Payer: Self-pay | Admitting: Gastroenterology

## 2023-11-23 ENCOUNTER — Ambulatory Visit

## 2023-11-23 NOTE — Telephone Encounter (Signed)
 Patient No-Showed the SDMV on 7/25 and LDCT subsequently has been cancelled. Patient called in and left VM today 11/23/23. Called patient back to reschedule but was unable to leave VM as his VM box is full.

## 2023-11-26 ENCOUNTER — Telehealth: Payer: Self-pay

## 2023-11-26 NOTE — Telephone Encounter (Signed)
 Copied from CRM [id number]. Topic: Appointments - Scheduling Inquiry for Clinic >> Nov 23, 2023  2:43 PM Rozanna MATSU wrote: Reason for CRM: PT CALLING TO [facility] HIS LUNG SCREENING PHONE CALL, STATED REC'D LETTER TO [facility]

## 2023-11-26 NOTE — Telephone Encounter (Signed)
 Returned call, unable to LVM. Message to [employer].

## 2023-11-26 NOTE — Telephone Encounter (Signed)
 Patient returning call he received from a carol .

## 2023-11-26 NOTE — Telephone Encounter (Signed)
 Patient received a call to reschedule his shared decision visit . Got patient transferred to the lung cancer screening department

## 2023-11-27 ENCOUNTER — Encounter: Payer: Self-pay | Admitting: Pulmonary Disease

## 2023-11-27 ENCOUNTER — Ambulatory Visit: Admitting: Pulmonary Disease

## 2023-11-27 ENCOUNTER — Ambulatory Visit (INDEPENDENT_AMBULATORY_CARE_PROVIDER_SITE_OTHER): Admitting: Pulmonary Disease

## 2023-11-27 VITALS — BP 144/80 | HR 67 | Temp 98.3°F | Ht 70.0 in | Wt 196.4 lb

## 2023-11-27 DIAGNOSIS — J449 Chronic obstructive pulmonary disease, unspecified: Secondary | ICD-10-CM

## 2023-11-27 DIAGNOSIS — R0602 Shortness of breath: Secondary | ICD-10-CM

## 2023-11-27 DIAGNOSIS — J439 Emphysema, unspecified: Secondary | ICD-10-CM

## 2023-11-27 DIAGNOSIS — J4489 Other specified chronic obstructive pulmonary disease: Secondary | ICD-10-CM

## 2023-11-27 DIAGNOSIS — F17210 Nicotine dependence, cigarettes, uncomplicated: Secondary | ICD-10-CM | POA: Diagnosis not present

## 2023-11-27 LAB — PULMONARY FUNCTION TEST
DL/VA % pred: 56 %
DL/VA: 2.36 ml/min/mmHg/L
DLCO unc % pred: 52 %
DLCO unc: 14.22 ml/min/mmHg
FEF 25-75 Post: 2.25 L/s
FEF 25-75 Pre: 2.11 L/s
FEF2575-%Change-Post: 6 %
FEF2575-%Pred-Post: 78 %
FEF2575-%Pred-Pre: 73 %
FEV1-%Change-Post: 1 %
FEV1-%Pred-Post: 83 %
FEV1-%Pred-Pre: 82 %
FEV1-Post: 2.97 L
FEV1-Pre: 2.94 L
FEV1FVC-%Change-Post: 0 %
FEV1FVC-%Pred-Pre: 98 %
FEV6-%Change-Post: 1 %
FEV6-%Pred-Post: 89 %
FEV6-%Pred-Pre: 88 %
FEV6-Post: 4.02 L
FEV6-Pre: 3.95 L
FEV6FVC-%Change-Post: 0 %
FEV6FVC-%Pred-Post: 104 %
FEV6FVC-%Pred-Pre: 104 %
FVC-%Change-Post: 2 %
FVC-%Pred-Post: 86 %
FVC-%Pred-Pre: 84 %
FVC-Post: 4.06 L
FVC-Pre: 3.97 L
Post FEV1/FVC ratio: 73 %
Post FEV6/FVC ratio: 99 %
Pre FEV1/FVC ratio: 74 %
Pre FEV6/FVC Ratio: 99 %
RV % pred: 116 %
RV: 2.67 L
TLC % pred: 97 %
TLC: 6.84 L

## 2023-11-27 MED ORDER — STIOLTO RESPIMAT 2.5-2.5 MCG/ACT IN AERS
2.0000 | INHALATION_SPRAY | Freq: Every day | RESPIRATORY_TRACT | 11 refills | Status: DC
Start: 2023-11-27 — End: 2024-03-03

## 2023-11-27 MED ORDER — STIOLTO RESPIMAT 2.5-2.5 MCG/ACT IN AERS: 2.0000 | INHALATION_SPRAY | Freq: Every day | RESPIRATORY_TRACT | Status: DC

## 2023-11-27 NOTE — Patient Instructions (Signed)
 Full PFT completed today ? ?

## 2023-11-27 NOTE — Progress Notes (Signed)
 Full PFT completed today ? ?

## 2023-11-27 NOTE — Patient Instructions (Signed)
 VISIT SUMMARY:  Today, you had a follow-up appointment to discuss your COPD, smoking habits, and ongoing evaluation for lung cancer. We reviewed your current symptoms and made some adjustments to your treatment plan to help manage your condition more effectively.  YOUR PLAN:  -CHRONIC OBSTRUCTIVE PULMONARY DISEASE, EMPHYSEMA TYPE: COPD, specifically the emphysema type, is a chronic lung condition that causes shortness of breath. We are starting you on a Stiolto inhaler once daily to help reduce your need for albuterol . You were given samples and instructions on how to use it.  -TOBACCO USE DISORDER: Tobacco use disorder means you are dependent on smoking, which worsens your COPD. We recommend starting with a 21 mg nicotine patch each morning and removing it before bed to help you quit smoking. Additionally, you can call 1-800-QUIT-NOW for support and use a FUM device (www.tryfum.com) to help with oral fixation.  Another good website is becomeanex.org this is a website supported by the [facility].  -LUNG CANCER SCREENING: You have been scheduled for your first lung cancer screening CT.  INSTRUCTIONS:  Please follow up with us  as scheduled for your ongoing lung cancer evaluation. Continue using the Stiolto inhaler daily and the nicotine patches as directed. If you have any questions or concerns, do not hesitate to contact our office.

## 2023-11-27 NOTE — Progress Notes (Signed)
 Subjective:    Patient ID: Henry Owens, male    DOB: [birth year], 63 y.o.   MRN: [id number]  Patient Care Team: Ostwalt, Janna, PA-C as PCP - General (Physician Assistant)  Chief Complaint  Patient presents with   Follow-up    Cough mostly in the morning.     BACKGROUND/INTERVAL:Patient is a 63 year old former smoker (81 pack years) with a history as noted below, who presents for evaluation of cough persistent after COVID-19 infection in March 2025. He has underlying history of COPD.  He initially was seen on 27 July 2023, this is a scheduled follow-up visit, patient had PFTs today  HPI Discussed the use of AI scribe software for clinical note transcription with the patient, who gave verbal consent to proceed.  History of Present Illness   Henry Owens is a 63 year old male with COPD who presents for a follow-up visit.  He presents with his wife, Tawni.  He is here for a follow-up on his COPD, primarily of the emphysema type. His shortness of breath is manageable, but he continues to smoke about a pack and a half of cigarettes per day. He is currently using albuterol  as needed and has not been using any other inhalers.  He had extensive counseling with regards to discontinuing smoking.  Provided the patient and his wife with information on online support.  The patient does not endorse any new symptomatology today.  He has a cough mostly in the mornings which is usually productive of whitish sputum.  No hemoptysis.  He does not endorse orthopnea nor paroxysmal nocturnal dyspnea.  No lower extremity edema.     DATA 11/27/2023 PFTs: FEV1 2.94 L or 82% predicted, FVC 3.97 L or 84% predicted, FEV1/FVC 74%, lung volumes are normal with only mild air trapping noted.  Diffusion capacity moderately reduced MIP/MEP consistent with possible deconditioning.  Study is consistent with obstructive airways disease, minimal and moderate diffusion defect.  Consistent with  COPD.  Review of Systems A 10 point review of systems was performed and it is as noted above otherwise negative.   Patient Active Problem List   Diagnosis Date Noted   Other sleep apnea 11/13/2023   Neck pain 11/13/2023   Depression, recurrent (HCC) 11/13/2023   Syncope and collapse 10/27/2023   B12 deficiency anemia 12/14/2022   Synthetic cannabinoid withdrawal (HCC) 12/13/2022   Cannabinoid hyperemesis syndrome 12/12/2022   Hypokalemia 12/11/2022   Overweight (BMI 25.0-29.9) 12/11/2022   Intractable nausea and vomiting 12/11/2022   CAP (community acquired pneumonia) 12/10/2022   Fall 11/26/2022   COPD with acute exacerbation (HCC) 02/07/2022   Weakness 02/07/2022   Thrombocytopenia (HCC) 02/07/2022   Essential hypertension 02/06/2022   Hyponatremia 02/06/2022   CKD stage 3a, GFR 45-59 ml/min (HCC) 02/06/2022   Sepsis (HCC) 11/10/2021   AKI (acute kidney injury) (HCC) 11/10/2021   Acute confusion 11/10/2021   Chronic, continuous use of opioids 11/10/2021   Aspiration pneumonia (HCC) 10/12/2021   Right upper quadrant pain 10/12/2021   Constipation 10/12/2021   Chronic pain 10/12/2021   Elevated troponin 10/12/2021   Acute respiratory failure with hypoxia and hypercapnia (HCC) 10/09/2021   Opiate overdose (HCC)    Unresponsive state    Acute respiratory failure due to COVID-19 (HCC) 07/05/2021   Current smoker 04/01/2021   Abnormal bone marrow examination 05/01/2020    Social History   Tobacco Use   Smoking status: Every Day    Current packs/day: 1.50    Average packs/day:  2.0 packs/day for 42.5 years (84.4 ttl pk-yrs)    Types: Cigarettes    Start date: 74    Last attempt to quit: 06/22/2021   Smokeless tobacco: Never  Substance Use Topics   Alcohol use: No    Allergies  Allergen Reactions   Wound Dressing Adhesive Other (See Comments)    BLISTER   Azithromycin  Rash and Dermatitis   Penicillin G Rash    Has patient had a PCN reaction causing immediate  rash, facial/tongue/throat swelling, SOB or lightheadedness with hypotension: Yes Has patient had a PCN reaction causing severe rash involving mucus membranes or skin necrosis: No Has patient had a PCN reaction that required hospitalization: No Has patient had a PCN reaction occurring within the last 10 years: No If all of the above answers are NO, then may proceed with Cephalosporin use.     Current Meds  Medication Sig   acetaminophen  (TYLENOL ) 500 MG tablet Take 1,000 mg by mouth in the morning, at noon, and at bedtime.   albuterol  (VENTOLIN  HFA) 108 (90 Base) MCG/ACT inhaler Inhale 2 puffs into the lungs every 6 (six) hours as needed for wheezing or shortness of breath. (Patient taking differently: Inhale 2 puffs into the lungs every 6 (six) hours as needed for wheezing or shortness of breath. Patient uses twice a day.)   aspirin  EC 81 MG tablet Take 81 mg by mouth daily. Swallow whole.   atorvastatin  (LIPITOR) 20 MG tablet Take 1 tablet (20 mg total) by mouth daily.   Cholecalciferol (D 1000) 25 MCG (1000 UT) capsule Take 1,000 Units by mouth daily.   cyanocobalamin  1000 MCG tablet Take 1 tablet (1,000 mcg total) by mouth daily.   divalproex (DEPAKOTE) 125 MG DR tablet Take 250 mg by mouth 2 (two) times daily.   fenofibrate  (TRICOR ) 48 MG tablet TAKE 1 TABLET(48 MG) BY MOUTH DAILY   fluticasone  (FLONASE ) 50 MCG/ACT nasal spray Place 1 spray into both nostrils daily as needed for allergies or rhinitis.   gabapentin  (NEURONTIN ) 300 MG capsule Take 3 capsules (900 mg total) by mouth 2 (two) times daily.   OXYGEN Inhale into the lungs. As needed Has not used in a year   pantoprazole  (PROTONIX ) 40 MG tablet TAKE 1 TABLET(40 MG) BY MOUTH DAILY   Spacer/Aero-Holding Raguel FRENCH Use as directed for inhaler use   Tiotropium Bromide-Olodaterol (STIOLTO RESPIMAT ) 2.5-2.5 MCG/ACT AERS Inhale 2 puffs into the lungs daily.   Tiotropium Bromide-Olodaterol (STIOLTO RESPIMAT ) 2.5-2.5 MCG/ACT AERS  Inhale 2 puffs into the lungs daily.   tiZANidine  (ZANAFLEX ) 4 MG tablet Take 4 mg by mouth 3 (three) times daily.   venlafaxine  XR (EFFEXOR  XR) 75 MG 24 hr capsule Take 1 capsule (75 mg total) by mouth daily with breakfast.    Immunization History  Administered Date(s) Administered   PNEUMOCOCCAL CONJUGATE-20 07/25/2021        Objective:     BP (!) 144/80 (BP Location: Left Arm, Patient Position: Sitting, Cuff Size: Normal)   Pulse 67   Temp 98.3 F (36.8 C) (Oral)   Ht 5' 10 (1.778 m)   Wt 196 lb 6.4 oz (89.1 kg)   SpO2 97%   BMI 28.18 kg/m   SpO2: 97 %  GENERAL: Awake and alert, ambulatory with assistance of a cane.  No conversational dyspnea. HEAD: Normocephalic, atraumatic.  EYES: Pupils equal, round, reactive to light.  No scleral icterus.  MOUTH: Nose/mouth/throat not examined due to institutional masking requirements. NECK: Supple. No thyromegaly. Trachea midline. No  JVD.  No adenopathy. PULMONARY: Good air entry bilaterally.  Coarse, otherwise, no adventitious sounds. CARDIOVASCULAR: S1 and S2. Regular rate and rhythm.  No rubs, murmurs or gallops heard. ABDOMEN: Benign. MUSCULOSKELETAL: No joint deformity, no clubbing, no edema.  NEUROLOGIC: Gait moderately unsteady, ambulates with a cane.  Appears to have some mild dysarthria.  No overt asymmetry. SKIN: Intact,warm,dry. PSYCH: Flat affect, appropriate otherwise.   Assessment & Plan:     ICD-10-CM   1. COPD with chronic bronchitis and emphysema (HCC)  J44.89    J43.9     2. Shortness of breath  R06.02     3. Tobacco dependence due to cigarettes  F17.210      Meds ordered this encounter  Medications   Tiotropium Bromide-Olodaterol (STIOLTO RESPIMAT ) 2.5-2.5 MCG/ACT AERS    Sig: Inhale 2 puffs into the lungs daily.    Dispense:  4 g    Refill:  11   Tiotropium Bromide-Olodaterol (STIOLTO RESPIMAT ) 2.5-2.5 MCG/ACT AERS    Sig: Inhale 2 puffs into the lungs daily.    Lot Number?:   [id number]     Quantity:   1   Discussion:    Chronic obstructive pulmonary disease, emphysema type COPD with emphysema phenotype, presenting with shortness of breath. Current treatment includes albuterol . - Initiate Stiolto inhaler once daily to reduce reliance on albuterol . - Provide samples of Stiolto and teach usage.  Tobacco use disorder Continues to smoke approximately 1.5 packs per day. Smoking cessation is critical for COPD management. - Recommend nicotine patches, starting with 21 mg patch, to be applied in the morning and removed before bed. - Advise to call 1-800-QUIT-NOW for smoking cessation support. - Provide information on FUM device for oral fixation. - Provide websites for additional resources.  Lung cancer screening Patient has been enrolled in lung cancer screening and is to have CT on 7 August.    Will see the patient in follow-up in 3 to 4 months time, he is to contact us  prior to that time should any new difficulties arise.  Advised if symptoms do not improve or worsen, to please contact office for sooner follow up or seek emergency care.    I spent 32 minutes of dedicated to the care of this patient on the date of this encounter to include pre-visit review of records, face-to-face time with the patient discussing conditions above, post visit ordering of testing, clinical documentation with the electronic health record, making appropriate referrals as documented, and communicating necessary findings to members of the patients care team.     C. Leita Sanders, MD Advanced Bronchoscopy [facility]    *This note was generated using voice recognition software/Dragon and/or AI transcription program.  Despite best efforts to proofread, errors can occur which can change the meaning. Any transcriptional errors that result from this process are unintentional and may not be fully corrected at the time of dictation.

## 2023-11-28 ENCOUNTER — Ambulatory Visit (INDEPENDENT_AMBULATORY_CARE_PROVIDER_SITE_OTHER): Admitting: *Deleted

## 2023-11-28 ENCOUNTER — Ambulatory Visit: Payer: Self-pay | Admitting: Pulmonary Disease

## 2023-11-28 ENCOUNTER — Encounter: Payer: Self-pay | Admitting: *Deleted

## 2023-11-28 DIAGNOSIS — F17210 Nicotine dependence, cigarettes, uncomplicated: Secondary | ICD-10-CM

## 2023-11-28 NOTE — Patient Instructions (Signed)

## 2023-11-28 NOTE — Progress Notes (Signed)
 Virtual Visit via Telephone Note  I connected with Henry Owens on 11/28/23 at 11:00 AM EDT by telephone and verified that I am speaking with the correct person using two identifiers.  Location: Patient: Henry Owens Provider: Laneta Speaks, RN   I discussed the limitations, risks, security and privacy concerns of performing an evaluation and management service by telephone and the availability of in person appointments. I also discussed with the patient that there may be a patient responsible charge related to this service. The patient expressed understanding and agreed to proceed    Shared Decision Making Visit Lung Cancer Screening Program [phone])   Eligibility: Age 63 y.o. Pack Years Smoking History Calculation 84 (# packs/per year x # years smoked) Recent History of coughing up blood  no Unexplained weight loss? no ( >Than 15 pounds within the last 6 months ) Prior History Lung / other cancer no (Diagnosis within the last 5 years already requiring surveillance chest CT Scans). Smoking Status Current Smoker Former Smokers: Years since quit: n/a  Quit Date: n/a  Visit Components: Discussion included one or more decision making aids. yes Discussion included risk/benefits of screening. yes Discussion included potential follow up diagnostic testing for abnormal scans. yes Discussion included meaning and risk of over diagnosis. yes Discussion included meaning and risk of False Positives. yes Discussion included meaning of total radiation exposure. yes  Counseling Included: Importance of adherence to annual lung cancer LDCT screening. yes Impact of comorbidities on ability to participate in the program. yes Ability and willingness to under diagnostic treatment. yes  Smoking Cessation Counseling: Current Smokers:  Discussed importance of smoking cessation. yes Information about tobacco cessation classes and interventions provided to patient. yes Patient  provided with ticket for LDCT Scan. no Symptomatic Patient. no  Counseling(Intermediate counseling: > three minutes) [id number] Diagnosis Code: Tobacco Use Z72.0 Asymptomatic Patient yes  Counseling (Intermediate counseling: > three minutes counseling) [id number] Former Smokers:  Discussed the importance of maintaining cigarette abstinence. yes Diagnosis Code: Personal History of Nicotine Dependence. S12.108 Information about tobacco cessation classes and interventions provided to patient. Yes Patient provided with ticket for LDCT Scan. no Written Order for Lung Cancer Screening with LDCT placed in Epic. Yes (CT Chest Lung Cancer Screening Low Dose W/O CM) [id number] Z12.2-Screening of respiratory organs Z87.891-Personal history of nicotine dependence   Laneta Speaks, RN

## 2023-11-29 ENCOUNTER — Ambulatory Visit
Admission: RE | Admit: 2023-11-29 | Discharge: 2023-11-29 | Disposition: A | Discharge status: Home / Self Care | Source: Ambulatory Visit | Attending: Acute Care | Admitting: Acute Care

## 2023-11-29 DIAGNOSIS — F17210 Nicotine dependence, cigarettes, uncomplicated: Secondary | ICD-10-CM | POA: Diagnosis not present

## 2023-11-29 DIAGNOSIS — Z122 Encounter for screening for malignant neoplasm of respiratory organs: Secondary | ICD-10-CM

## 2023-11-29 DIAGNOSIS — Z87891 Personal history of nicotine dependence: Secondary | ICD-10-CM

## 2023-12-01 ENCOUNTER — Encounter: Payer: Self-pay | Admitting: Pulmonary Disease

## 2023-12-03 ENCOUNTER — Telehealth: Payer: Self-pay | Admitting: Physician Assistant

## 2023-12-03 NOTE — Progress Notes (Signed)
 Tobacco counseling provided (Intermediate counseling: 3 1/2 minutes) [id number]

## 2023-12-03 NOTE — Telephone Encounter (Signed)
 Patient return call back, patient's daughter called about what patients daughter requested previously, ad hasn't ogtten an answr yet. I I couldnt get anyone in the office

## 2023-12-03 NOTE — Telephone Encounter (Unsigned)
 Copied from CRM [id number]. Topic: Clinical - Order For Equipment >> Dec 03, 2023  1:36 PM Tiffini S wrote: Reason for CRM: Patient daughter Emmalene called about a copy of the sleep studies paperwork dropped off a few weeks ago/ needs an update about the DME equipment for CPAP machine for a nose pillow. Please call the patient daughter back at [phone].

## 2023-12-05 ENCOUNTER — Encounter: Payer: Self-pay | Admitting: Diagnostic Neuroimaging

## 2023-12-05 ENCOUNTER — Ambulatory Visit: Admitting: Diagnostic Neuroimaging

## 2023-12-05 ENCOUNTER — Other Ambulatory Visit: Payer: Self-pay | Admitting: Physician Assistant

## 2023-12-05 VITALS — BP 127/72 | HR 62 | Ht 70.0 in | Wt 196.0 lb

## 2023-12-05 DIAGNOSIS — F339 Major depressive disorder, recurrent, unspecified: Secondary | ICD-10-CM

## 2023-12-05 DIAGNOSIS — R55 Syncope and collapse: Secondary | ICD-10-CM | POA: Diagnosis not present

## 2023-12-05 DIAGNOSIS — F419 Anxiety disorder, unspecified: Secondary | ICD-10-CM

## 2023-12-05 DIAGNOSIS — F32A Depression, unspecified: Secondary | ICD-10-CM

## 2023-12-05 NOTE — Progress Notes (Signed)
 [facility]  PATIENT: Henry Owens DOB: Jan 02, 1961  REFERRING CLINICIAN: Ostwalt, Janna, PA-C HISTORY FROM: patient  REASON FOR VISIT: new consult   HISTORICAL  CHIEF COMPLAINT:  Chief Complaint  Patient presents with   New Patient (Initial Visit)    Pt with wife, rm 7. States 2 years ago he was hostpialized for a different recent. Was told that after completing a MRI which indicated he had a stroke. There have been 2 episodes of seizure like activity. where he has a tunnel vision. States that he is aware when this episode but unable to respond or acknowledge and legs bilaterally tremors and blood pressure dropped. The last time was less than 2 mths ago. He had a EEG completed after the first episode which was negative.   Other    He states he has intermittent episodes of jerking motions.     HISTORY OF PRESENT ILLNESS:   63 year old male here for evaluation of syncope with seizure.  2024 patient had 3 events of loss of consciousness.  Was evaluated by neurology and started on levetiracetam empirically.  In March 2025 episode of low blood pressure and passing out at home.  In July 2025 had another episode of sudden onset loss of consciousness, eyes open, staring, shakiness in the legs, associated with very low blood pressure.  He was taken to the hospital by EMS.  Was diagnosed with acute kidney disease.  Since then followed up with his neurologist who empirically started divalproex 200 mg twice a day.  Patient had been having some intermittent muscle jerks symptoms which also have improved.    REVIEW OF SYSTEMS: Full 14 system review of systems performed and negative with exception of: as per HPI.  ALLERGIES: Allergies  Allergen Reactions   Wound Dressing Adhesive Other (See Comments)    BLISTER   Azithromycin  Rash and Dermatitis   Penicillin G Rash    Has patient had a PCN reaction causing immediate rash, facial/tongue/throat swelling, SOB or  lightheadedness with hypotension: Yes Has patient had a PCN reaction causing severe rash involving mucus membranes or skin necrosis: No Has patient had a PCN reaction that required hospitalization: No Has patient had a PCN reaction occurring within the last 10 years: No If all of the above answers are NO, then may proceed with Cephalosporin use.     HOME MEDICATIONS: Outpatient Medications Prior to Visit  Medication Sig Dispense Refill   acetaminophen  (TYLENOL ) 500 MG tablet Take 1,000 mg by mouth in the morning, at noon, and at bedtime.     albuterol  (VENTOLIN  HFA) 108 (90 Base) MCG/ACT inhaler Inhale 2 puffs into the lungs every 6 (six) hours as needed for wheezing or shortness of breath. 8 g 2   aspirin  EC 81 MG tablet Take 81 mg by mouth daily. Swallow whole.     atorvastatin  (LIPITOR) 20 MG tablet Take 1 tablet (20 mg total) by mouth daily. 90 tablet 3   Cholecalciferol (D 1000) 25 MCG (1000 UT) capsule Take 1,000 Units by mouth daily.     cyanocobalamin  1000 MCG tablet Take 1 tablet (1,000 mcg total) by mouth daily. 30 tablet 0   divalproex (DEPAKOTE) 125 MG DR tablet Take 250 mg by mouth 2 (two) times daily.     fenofibrate  (TRICOR ) 48 MG tablet TAKE 1 TABLET(48 MG) BY MOUTH DAILY 90 tablet 1   fluticasone  (FLONASE ) 50 MCG/ACT nasal spray Place 1 spray into both nostrils daily as needed for allergies or rhinitis.  gabapentin  (NEURONTIN ) 300 MG capsule Take 3 capsules (900 mg total) by mouth 2 (two) times daily.     nortriptyline (PAMELOR) 10 MG capsule Take 10 mg by mouth daily.     OXYGEN Inhale into the lungs. As needed Has not used in a year     pantoprazole  (PROTONIX ) 40 MG tablet TAKE 1 TABLET(40 MG) BY MOUTH DAILY 90 tablet 1   QUEtiapine (SEROQUEL) 25 MG tablet Take 75 mg by mouth at bedtime.     Spacer/Aero-Holding Raguel FRENCH Use as directed for inhaler use 1 each 0   Tiotropium Bromide-Olodaterol (STIOLTO RESPIMAT ) 2.5-2.5 MCG/ACT AERS Inhale 2 puffs into the lungs  daily. 4 g 11   Tiotropium Bromide-Olodaterol (STIOLTO RESPIMAT ) 2.5-2.5 MCG/ACT AERS Inhale 2 puffs into the lungs daily.     tiZANidine  (ZANAFLEX ) 4 MG tablet Take 4 mg by mouth 3 (three) times daily.     venlafaxine  XR (EFFEXOR  XR) 75 MG 24 hr capsule Take 1 capsule (75 mg total) by mouth daily with breakfast. 90 capsule 1   No facility-administered medications prior to visit.    PAST MEDICAL HISTORY: Past Medical History:  Diagnosis Date   Allergy 2020   Anxiety    Arthritis    Blood transfusion without reported diagnosis 11/2022   Breast cancer (HCC)    COPD (chronic obstructive pulmonary disease) (HCC) 2023   Depression    Emphysema of lung (HCC)    Flu 07/2017   GERD (gastroesophageal reflux disease) 2023   Hyperlipidemia    Hypertension    Intractable nausea and vomiting 12/11/2022   Nodule of right lung    Oxygen deficiency 2023   Only when needed   Sleep apnea 2024   Stroke (HCC) 11/2022   Tinnitus     PAST SURGICAL HISTORY: Past Surgical History:  Procedure Laterality Date   APPENDECTOMY  1975   BACK SURGERY     COLONOSCOPY N/A 11/19/2023   Procedure: COLONOSCOPY;  Surgeon: Maryruth Ole DASEN, MD;  Location: [facility];  Service: Endoscopy;  Laterality: N/A;   COLONOSCOPY WITH PROPOFOL  N/A 05/18/2023   Procedure: COLONOSCOPY WITH PROPOFOL ;  Surgeon: Maryruth Ole DASEN, MD;  Location: [facility];  Service: Endoscopy;  Laterality: N/A;   ESOPHAGOGASTRODUODENOSCOPY (EGD) WITH PROPOFOL  N/A 05/18/2023   Procedure: ESOPHAGOGASTRODUODENOSCOPY (EGD) WITH PROPOFOL ;  Surgeon: Maryruth Ole DASEN, MD;  Location: [facility];  Service: Endoscopy;  Laterality: N/A;   HEMOSTASIS CLIP PLACEMENT  05/18/2023   Procedure: HEMOSTASIS CLIP PLACEMENT;  Surgeon: Maryruth Ole DASEN, MD;  Location: [facility];  Service: Endoscopy;;   KNEE ARTHROSCOPY WITH MEDIAL MENISECTOMY Right 08/20/2017   Procedure: KNEE ARTHROSCOPY WITH PARTIAL MEDIAL AND LATERAL MENISECTOMY,PARTIAL  SYNOVECTOMY,CHONDROPLASTY, LOOSE BODY REMOVAL;  Surgeon: Leora Lynwood SAUNDERS, MD;  Location: [facility];  Service: Orthopedics;  Laterality: Right;   KNEE SURGERY  1975   1996, 2000   POLYPECTOMY  05/18/2023   Procedure: POLYPECTOMY;  Surgeon: Maryruth Ole DASEN, MD;  Location: [facility];  Service: Endoscopy;;   SPINE SURGERY  9 1 89   Last of multiple procedures,  limbar fusion   SUBMUCOSAL LIFTING INJECTION  05/18/2023   Procedure: SUBMUCOSAL LIFTING INJECTION;  Surgeon: Maryruth Ole DASEN, MD;  Location: [facility];  Service: Endoscopy;;    FAMILY HISTORY: Family History  Problem Relation Age of Onset   Hypertension Mother    Stroke Mother    Cancer Mother        colon   Arthritis Mother    Depression Mother  Stroke Father    Arthritis Father    Hearing loss Father    Anxiety disorder Son    Anxiety disorder Daughter    Obesity Sister    Bladder Cancer Neg Hx    Prostate cancer Neg Hx    Hematuria Neg Hx     SOCIAL HISTORY: Social History   Socioeconomic History   Marital status: Married    Spouse name: Not on file   Number of children: Not on file   Years of education: Not on file   Highest education level: 11th grade  Occupational History   Not on file  Tobacco Use   Smoking status: Every Day    Current packs/day: 1.50    Average packs/day: 2.0 packs/day for 42.5 years (84.4 ttl pk-yrs)    Types: Cigarettes    Start date: 55    Last attempt to quit: 06/22/2021   Smokeless tobacco: Never  Vaping Use   Vaping status: Never Used  Substance and Sexual Activity   Alcohol use: No   Drug use: Not Currently    Types: Marijuana   Sexual activity: Not Currently    Birth control/protection: Abstinence  Other Topics Concern   Not on file  Social History Narrative   Not on file   Social Drivers of Health   Financial Resource Strain: Low Risk  (10/24/2023)   Overall Financial Resource Strain (CARDIA)    Difficulty of Paying Living Expenses: Not very hard   Food Insecurity: No Food Insecurity (10/27/2023)   Hunger Vital Sign    Worried About Running Out of Food in the Last Year: Never true    Ran Out of Food in the Last Year: Never true  Recent Concern: Food Insecurity - Food Insecurity Present (10/24/2023)   Hunger Vital Sign    Worried About Running Out of Food in the Last Year: Never true    Ran Out of Food in the Last Year: Sometimes true  Transportation Needs: No Transportation Needs (10/27/2023)   PRAPARE - Administrator, Civil Service (Medical): No    Lack of Transportation (Non-Medical): No  Physical Activity: Inactive (10/24/2023)   Exercise Vital Sign    Days of Exercise per Week: 0 days    Minutes of Exercise per Session: Not on file  Stress: Stress Concern Present (10/24/2023)   [employer] of Occupational Health - Occupational Stress Questionnaire    Feeling of Stress: Very much  Social Connections: Socially Isolated (10/27/2023)   Social Connection and Isolation Panel    Frequency of Communication with Friends and Family: Never    Frequency of Social Gatherings with Friends and Family: Never    Attends Religious Services: Never    Database administrator or Organizations: No    Attends Banker Meetings: Never    Marital Status: Married  Catering manager Violence: Not At Risk (10/27/2023)   Humiliation, Afraid, Rape, and Kick questionnaire    Fear of Current or Ex-Partner: No    Emotionally Abused: No    Physically Abused: No    Sexually Abused: No     PHYSICAL EXAM  GENERAL EXAM/CONSTITUTIONAL: Vitals:  Vitals:   12/05/23 1428  BP: 127/72  Pulse: 62  Weight: 196 lb (88.9 kg)  Height: 5' 10 (1.778 m)   Body mass index is 28.12 kg/m. Wt Readings from Last 3 Encounters:  12/05/23 196 lb (88.9 kg)  11/27/23 196 lb 6.4 oz (89.1 kg)  11/27/23 196 lb 6.4 oz (89.1 kg)  Patient is in no distress; well developed, nourished and groomed; neck is supple  CARDIOVASCULAR: Examination of carotid  arteries is normal; no carotid bruits Regular rate and rhythm, no murmurs Examination of peripheral vascular system by observation and palpation is normal  EYES: Ophthalmoscopic exam of optic discs and posterior segments is normal; no papilledema or hemorrhages No results found.  MUSCULOSKELETAL: Gait, strength, tone, movements noted in Neurologic exam below  NEUROLOGIC: MENTAL STATUS:      No data to display         awake, alert, oriented to person, place and time recent and remote memory intact normal attention and concentration language fluent, comprehension intact, naming intact fund of knowledge appropriate  CRANIAL NERVE:  2nd - no papilledema on fundoscopic exam 2nd, 3rd, 4th, 6th - pupils equal and reactive to light, visual fields full to confrontation, extraocular muscles intact, no nystagmus 5th - facial sensation symmetric 7th - facial strength symmetric 8th - hearing intact 9th - palate elevates symmetrically, uvula midline 11th - shoulder shrug symmetric 12th - tongue protrusion midline  MOTOR:  normal bulk and tone, full strength in the BUE, BLE  SENSORY:  normal and symmetric to light touch, temperature, vibration  COORDINATION:  finger-nose-finger, fine finger movements normal  REFLEXES:  deep tendon reflexes TRACE and symmetric  GAIT/STATION:  narrow based gait; USING CANE     DIAGNOSTIC DATA (LABS, IMAGING, TESTING) - I reviewed patient records, labs, notes, testing and imaging myself where available.  Lab Results  Component Value Date   WBC 8.6 10/27/2023   HGB 13.3 10/27/2023   HCT 40.7 10/27/2023   MCV 91.3 10/27/2023   PLT 179 10/27/2023      Component Value Date/Time   NA 141 10/27/2023 0516   NA 140 05/28/2023 1052   K 4.3 10/27/2023 0516   CL 111 10/27/2023 0516   CO2 25 10/27/2023 0516   GLUCOSE 93 10/27/2023 0516   BUN 15 10/27/2023 0516   BUN 12 05/28/2023 1052   CREATININE 1.56 (H) 10/27/2023 0516   CALCIUM  9.1  10/27/2023 0516   PROT 6.1 (L) 10/27/2023 0118   PROT 6.5 05/28/2023 1052   ALBUMIN 3.5 10/27/2023 0118   ALBUMIN 4.4 05/28/2023 1052   AST 18 10/27/2023 0118   ALT 18 10/27/2023 0118   ALKPHOS 111 10/27/2023 0118   BILITOT 0.2 10/27/2023 0118   BILITOT 0.2 05/28/2023 1052   GFRNONAA 50 (L) 10/27/2023 0516   Lab Results  Component Value Date   CHOL 213 (H) 08/03/2023   HDL 24 (L) 08/03/2023   LDLCALC 112 (H) 08/03/2023   LDLDIRECT 55 11/28/2022   TRIG 443 (H) 08/03/2023   CHOLHDL 8.9 (H) 08/03/2023   Lab Results  Component Value Date   HGBA1C 5.9 (H) 05/28/2023   Lab Results  Component Value Date   VITAMINB12 661 01/22/2023   Lab Results  Component Value Date   TSH 2.100 05/28/2023     02/12/23  This 72 hour ambulatory EEG with video in the awake and asleep states is within normal limits.     ASSESSMENT AND PLAN  64 y.o. year old male here with:   Dx:  1. Recurrent syncope      PLAN:  RECURRENT LOSS OF CONSCIOUSNESS, SHAKING, LOW BP (dx: likely convulsive syncope; no post ictal confusion; no tongue biting or incontinence; seizure disorder possible but less likely)  - check MRI brain w/wo  - stay on divalproex 250mg  twice a day (may help myoclonus, seizure and mood)  -  According to [place] law, you can not drive unless you are seizure / syncope free for at least 6 months and under physician's care.   - Please maintain precautions. Do not participate in activities where a loss of awareness could harm you or someone else. No swimming alone, no tub bathing, no hot tubs, no driving, no operating motorized vehicles (cars, ATVs, motocycles, etc), lawnmowers, power tools or firearms. No standing at heights, such as rooftops, ladders or stairs. Avoid hot objects such as stoves, heaters, open fires. Wear a helmet when riding a bicycle, scooter, skateboard, etc. and avoid areas of traffic. Set your water  heater to 120 degrees or less.    MEMORY LOSS / HYPOXIC-ISCHEMIC  ENCEPHALOPATHY (since 2021 pneumonia, respiratory failure / hypoxia, resuscitation, ICU course) - continue supportive care  Orders Placed This Encounter  Procedures   MR BRAIN WO CONTRAST   Return in about 6 months (around 06/06/2024) for MyChart visit (15 min).    EDUARD FABIENE HANLON, MD 12/05/2023, 3:33 PM Certified in Neurology, Neurophysiology and Neuroimaging  [facility] [address], [address] [place], [place] [address] [phone]

## 2023-12-05 NOTE — Patient Instructions (Signed)
  RECURRENT LOSS OF CONSCIOUSNESS, SHAKING, LOW BP (dx: likely convulsive syncope; no post ictal confusion; no tongue biting or incontinence; seizure disorder possible but less likely)  - check MRI brain w/wo  - stay on divalproex 250mg  twice a day (may help myoclonus, seizure and mood)  - According to [place] law, you can not drive unless you are seizure / syncope free for at least 6 months and under physician's care.   - Please maintain precautions. Do not participate in activities where a loss of awareness could harm you or someone else. No swimming alone, no tub bathing, no hot tubs, no driving, no operating motorized vehicles (cars, ATVs, motocycles, etc), lawnmowers, power tools or firearms. No standing at heights, such as rooftops, ladders or stairs. Avoid hot objects such as stoves, heaters, open fires. Wear a helmet when riding a bicycle, scooter, skateboard, etc. and avoid areas of traffic. Set your water  heater to 120 degrees or less.    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

## 2023-12-06 ENCOUNTER — Encounter: Payer: Self-pay | Admitting: Diagnostic Neuroimaging

## 2023-12-07 MED ORDER — VENLAFAXINE HCL ER 75 MG PO CP24: 75.0000 mg | ORAL_CAPSULE | Freq: Every day | ORAL | 1 refills | Status: AC

## 2023-12-10 ENCOUNTER — Ambulatory Visit
Admission: RE | Admit: 2023-12-10 | Discharge: 2023-12-10 | Disposition: A | Discharge status: Home / Self Care | Source: Ambulatory Visit | Attending: Diagnostic Neuroimaging | Admitting: Diagnostic Neuroimaging

## 2023-12-10 DIAGNOSIS — R55 Syncope and collapse: Secondary | ICD-10-CM | POA: Diagnosis not present

## 2023-12-11 ENCOUNTER — Telehealth: Payer: Self-pay | Admitting: Diagnostic Neuroimaging

## 2023-12-11 ENCOUNTER — Other Ambulatory Visit: Payer: Self-pay | Admitting: Physician Assistant

## 2023-12-11 ENCOUNTER — Ambulatory Visit: Admitting: Diagnostic Neuroimaging

## 2023-12-11 ENCOUNTER — Other Ambulatory Visit: Payer: Self-pay | Admitting: Neurology

## 2023-12-11 DIAGNOSIS — I10 Essential (primary) hypertension: Secondary | ICD-10-CM

## 2023-12-11 NOTE — Telephone Encounter (Signed)
 Pt called requesting a refill request for his nortriptyline (PAMELOR) 10 MG capsule to be sent to the [facility] in [place].

## 2023-12-11 NOTE — Telephone Encounter (Signed)
 Called pt and he stated that Dr. Lane his old Neurologist was who prescribed this medication to him but pt was informed that his new Neurologist will take over filling his prescription. Pt was informed that this information was going to be sent to Dr. Margaret but that he will not be back till Thursday. Pt verbalized understanding and was very appreciative.

## 2023-12-11 NOTE — Telephone Encounter (Signed)
 Pt seen previously with Dr Margaret. He discussed continuing the Depakote that he was prescribed but didn't mention that he would continue the nortriptyline.if neurology is suppose to continue this medication then we would have to get the approval from Dr Margaret and he wont be back in the office until Thursday.

## 2023-12-12 ENCOUNTER — Other Ambulatory Visit: Payer: Self-pay | Admitting: Acute Care

## 2023-12-12 ENCOUNTER — Encounter: Payer: Self-pay | Admitting: Physician Assistant

## 2023-12-12 DIAGNOSIS — Z87891 Personal history of nicotine dependence: Secondary | ICD-10-CM

## 2023-12-12 DIAGNOSIS — Z122 Encounter for screening for malignant neoplasm of respiratory organs: Secondary | ICD-10-CM

## 2023-12-12 DIAGNOSIS — I10 Essential (primary) hypertension: Secondary | ICD-10-CM

## 2023-12-12 DIAGNOSIS — F17210 Nicotine dependence, cigarettes, uncomplicated: Secondary | ICD-10-CM

## 2023-12-12 NOTE — Telephone Encounter (Signed)
 Please advise this medication has been D/C back in July called and spoke to the pt wife, she stated he has been still taking the 25mg ,

## 2023-12-12 NOTE — Telephone Encounter (Signed)
 Pt wife is also checking in on the CPAP order, after reviewing the chart there is know message about a CPAP order

## 2023-12-13 ENCOUNTER — Telehealth: Payer: Self-pay

## 2023-12-13 NOTE — Telephone Encounter (Signed)
 Pt called for update. Pt was informed that it has been forwarded to the provider and that now we are waiting for an answer. Once nurse receives an answer he will be informed. Pt was very appreciative.

## 2023-12-13 NOTE — Telephone Encounter (Signed)
 From what I can tell this was discontinued at the time of discharge from the hospital .  However, that was just for a colonoscopy, so I don't know why they would have taken him off of it unless he said he didn't take it because he was supposed to be NPO for the procedure???    Copied from CRM [id number]. Topic: Clinical - Medication Question >> Dec 13, 2023  2:51 PM Selinda RAMAN wrote: Reason for CRM: The patient called in along with his wife asking if he can get his atenolol  (TENORMIN ) 50 MG tablet refilled.  The medication is not listed on his current med list and the refill request says not appropriate. They are not sure what is going on and don't remember the provider saying stop taking it. Please contact patient as soon as possible as he only has 2 pills left and doesn't know what to do

## 2023-12-14 ENCOUNTER — Emergency Department
Admission: EM | Admit: 2023-12-14 | Discharge: 2023-12-14 | Disposition: A | Discharge status: Home / Self Care | Source: Ambulatory Visit

## 2023-12-14 ENCOUNTER — Other Ambulatory Visit: Payer: Self-pay

## 2023-12-14 ENCOUNTER — Emergency Department

## 2023-12-14 DIAGNOSIS — J189 Pneumonia, unspecified organism: Secondary | ICD-10-CM | POA: Diagnosis not present

## 2023-12-14 DIAGNOSIS — R0602 Shortness of breath: Secondary | ICD-10-CM | POA: Insufficient documentation

## 2023-12-14 DIAGNOSIS — F17200 Nicotine dependence, unspecified, uncomplicated: Secondary | ICD-10-CM | POA: Diagnosis not present

## 2023-12-14 DIAGNOSIS — R052 Subacute cough: Secondary | ICD-10-CM

## 2023-12-14 DIAGNOSIS — J441 Chronic obstructive pulmonary disease with (acute) exacerbation: Secondary | ICD-10-CM | POA: Diagnosis not present

## 2023-12-14 DIAGNOSIS — R058 Other specified cough: Secondary | ICD-10-CM | POA: Diagnosis not present

## 2023-12-14 DIAGNOSIS — R059 Cough, unspecified: Secondary | ICD-10-CM | POA: Diagnosis not present

## 2023-12-14 DIAGNOSIS — J439 Emphysema, unspecified: Secondary | ICD-10-CM | POA: Diagnosis not present

## 2023-12-14 LAB — CBC
HCT: 44.8 % (ref 39.0–52.0)
Hemoglobin: 14.3 g/dL (ref 13.0–17.0)
MCH: 28.9 pg (ref 26.0–34.0)
MCHC: 31.9 g/dL (ref 30.0–36.0)
MCV: 90.5 fL (ref 80.0–100.0)
Platelets: 243 K/uL (ref 150–400)
RBC: 4.95 MIL/uL (ref 4.22–5.81)
RDW: 13.4 % (ref 11.5–15.5)
WBC: 16.3 K/uL — ABNORMAL HIGH (ref 4.0–10.5)
nRBC: 0 % (ref 0.0–0.2)

## 2023-12-14 LAB — BASIC METABOLIC PANEL WITH GFR
Anion gap: 10 (ref 5–15)
BUN: 16 mg/dL (ref 8–23)
CO2: 26 mmol/L (ref 22–32)
Calcium: 9.5 mg/dL (ref 8.9–10.3)
Chloride: 102 mmol/L (ref 98–111)
Creatinine, Ser: 1.52 mg/dL — ABNORMAL HIGH (ref 0.61–1.24)
GFR, Estimated: 51 mL/min — ABNORMAL LOW (ref >60)
Glucose, Bld: 92 mg/dL (ref 70–99)
Potassium: 4.6 mmol/L (ref 3.5–5.1)
Sodium: 138 mmol/L (ref 135–145)

## 2023-12-14 LAB — LACTIC ACID, PLASMA
Lactic Acid, Venous: 1.3 mmol/L (ref 0.5–1.9)
Lactic Acid, Venous: 1.3 mmol/L (ref 0.5–1.9)

## 2023-12-14 LAB — RESP PANEL BY RT-PCR (RSV, FLU A&B, COVID)  RVPGX2
Influenza A by PCR: NEGATIVE
Influenza B by PCR: NEGATIVE
Resp Syncytial Virus by PCR: NEGATIVE
SARS Coronavirus 2 by RT PCR: NEGATIVE

## 2023-12-14 MED ORDER — DOXYCYCLINE HYCLATE 100 MG PO TABS: 100.0000 mg | ORAL_TABLET | Freq: Two times a day (BID) | ORAL | 0 refills | Status: AC

## 2023-12-14 MED ORDER — PREDNISONE 20 MG PO TABS: 20.0000 mg | ORAL_TABLET | Freq: Two times a day (BID) | ORAL | 0 refills | Status: AC

## 2023-12-14 MED ORDER — ALBUTEROL SULFATE (2.5 MG/3ML) 0.083% IN NEBU: 2.5000 mg | INHALATION_SOLUTION | RESPIRATORY_TRACT | 2 refills | Status: AC | PRN

## 2023-12-14 MED ORDER — IPRATROPIUM-ALBUTEROL 0.5-2.5 (3) MG/3ML IN SOLN
3.0000 mL | Freq: Once | RESPIRATORY_TRACT | Status: AC
  Administered 2023-12-14: 3 mL via RESPIRATORY_TRACT
  Filled 2023-12-14: qty 3

## 2023-12-14 MED ORDER — METHYLPREDNISOLONE SODIUM SUCC 125 MG IJ SOLR
125.0000 mg | Freq: Once | INTRAMUSCULAR | Status: AC
  Administered 2023-12-14: 125 mg via INTRAVENOUS
  Filled 2023-12-14: qty 2

## 2023-12-14 MED ORDER — DOXYCYCLINE HYCLATE 100 MG PO TABS
100.0000 mg | ORAL_TABLET | Freq: Once | ORAL | Status: AC
  Administered 2023-12-14: 100 mg via ORAL
  Filled 2023-12-14: qty 1

## 2023-12-14 MED ORDER — ALBUTEROL SULFATE HFA 108 (90 BASE) MCG/ACT IN AERS: 2.0000 | INHALATION_SPRAY | RESPIRATORY_TRACT | 2 refills | Status: AC | PRN

## 2023-12-14 NOTE — Telephone Encounter (Signed)
 Mailbox full. Unable to leave voicemail. Ok to advise if call is returned

## 2023-12-14 NOTE — ED Notes (Signed)
 O2 sats  Lying 98 Sitting 99 Standing 98 Walking 94-98

## 2023-12-14 NOTE — Discharge Instructions (Signed)
 You were seen in the emergency department for shortness of breath.  Workup was reassuring.  I do think your symptoms are secondary to a COPD exacerbation.  Please give yourself nebulizing treatment every 4-6 hours while awake.  Your next dose of steroids and antibiotic will be due tomorrow morning.  Please follow-up with your primary care physician as soon as possible.  Return if any acutely worsening symptoms or any other emergency. -- RETURN PRECAUTIONS & AFTERCARE: (ENGLISH) RETURN PRECAUTIONS: Return immediately to the emergency department or see/call your doctor if you feel worse, weak or have changes in speech or vision, are short of breath, have fever, vomiting, pain, bleeding or dark stool, trouble urinating or any new issues. Return here or see/call your doctor if not improving as expected for your suspected condition. FOLLOW-UP CARE: Call your doctor and/or any doctors we referred you to for more advice and to make an appointment. Do this today, tomorrow or after the weekend. Some doctors only take PPO insurance so if you have HMO insurance you may want to contact your HMO or your regular doctor for referral to a specialist within your plan. Either way tell the doctor's office that it was a referral from the emergency department so you get the soonest possible appointment.  YOUR TEST RESULTS: Take result reports of any blood or urine tests, imaging tests and EKG's to your doctor and any referral doctor. Have any abnormal tests repeated. Your doctor or a referral doctor can let you know when this should be done. Also make sure your doctor contacts this hospital to get any test results that are not currently available such as cultures or special tests for infection and final imaging reports, which are often not available at the time you leave the ER but which may list additional important findings that are not documented on the preliminary report. BLOOD PRESSURE: If your blood pressure was greater than  120/80 have your blood pressure rechecked within 1 to 2 weeks. MEDICATION SIDE EFFECTS: Do not drive, walk, bike, take the bus, etc. if you have received or are being prescribed any sedating medications such as those for pain or anxiety or certain antihistamines like Benadryl . If you have been give one of these here get a taxi home or have a friend drive you home. Ask your pharmacist to counsel you on potential side effects of any new medication

## 2023-12-14 NOTE — ED Triage Notes (Signed)
 Pt comes with c/o  sob since yesterday. Pt states [facility] to [facility] and [facility] with [facility]. Pt has hx of pneumonia with sepsis in past. Pt denies any fevers at home

## 2023-12-14 NOTE — ED Provider Notes (Signed)
 [facility] Provider Note    Event Date/Time   First MD Initiated Contact with Patient 12/14/23 1748     (approximate)   History   Pneumonia   HPI  Henry Owens is a 63 y.o. male with a past medical history of COPD with ongoing smoking but does not use oxygen, GERD, hyperlipidemia who presents with 24 hours of shortness of breath and cough.  Patient states that symptoms feel similar to when he was diagnosed with pneumonia and sepsis in the past.  Denies any fevers or chills abdominal pain nausea and vomiting.  [facility] and [facility] diagnosed with [facility] and told to come to the emergency department.  He denies any headache, nausea or vomiting, chest pain, changes in urinary or bowel habits. He presents with his wife who contributes to the history      Physical Exam   Triage Vital Signs: ED Triage Vitals [12/14/23 1507]  Encounter Vitals Group     BP 121/71     Girls Systolic BP Percentile      Girls Diastolic BP Percentile      Boys Systolic BP Percentile      Boys Diastolic BP Percentile      Pulse Rate 80     Resp 19     Temp 98.2 F (36.8 C)     Temp src      SpO2 95 %     Weight 196 lb (88.9 kg)     Height 5' 10 (1.778 m)     Head Circumference      Peak Flow      Pain Score 7     Pain Loc      Pain Education      Exclude from Growth Chart     Most recent vital signs: Vitals:   12/14/23 1647 12/14/23 1648  BP:    Pulse:    Resp:    Temp:    SpO2: 94% 94%    Nursing Triage Note reviewed. Vital signs reviewed and patients oxygen saturation is normoxic  General: Patient is well nourished, well developed, awake and alert, resting comfortably in no acute distress Head: Normocephalic and atraumatic Eyes: Normal inspection, extraocular muscles intact, no conjunctival pallor Ear, nose, throat: Normal external exam Neck: Normal range of motion Respiratory: Patient is in mild respiratory distress, lungs with copious  amount of wheezing Cardiovascular: Patient is not tachycardic, RRR without murmur appreciated GI: Abd SNT with no guarding or rebound  Back: Normal inspection of the back with good strength and range of motion throughout all ext Extremities: pulses intact with good cap refills, no LE pitting edema or calf tenderness Neuro: The patient is alert and oriented to person, place, and time, appropriately conversive, with 5/5 bilat UE/LE strength, no gross motor or sensory defects noted. Coordination appears to be adequate. Skin: Warm, dry, and intact Psych: normal mood and affect, no SI or HI  ED Results / Procedures / Treatments   Labs (all labs ordered are listed, but only abnormal results are displayed) Labs Reviewed  BASIC METABOLIC PANEL WITH GFR - Abnormal; Notable for the following components:      Result Value   Creatinine, Ser 1.52 (*)    GFR, Estimated 51 (*)    All other components within normal limits  CBC - Abnormal; Notable for the following components:   WBC 16.3 (*)    All other components within normal limits  RESP PANEL BY RT-PCR (  RSV, FLU A&B, COVID)  RVPGX2  LACTIC ACID, PLASMA  LACTIC ACID, PLASMA     EKG EKG and rhythm strip are interpreted by myself:   EKG: [Normal sinus rhythm] at heart rate of 80, normal QRS duration, QTc 429,nonspecific ST segments and T waves no ectopy EKG not consistent with Acute STEMI Rhythm strip: NSR in lead II   RADIOLOGY Xray chest: No acute abnormality on my independent review interpretation radiologist agrees    PROCEDURES:  Critical Care performed: No  Procedures   MEDICATIONS ORDERED IN ED: Medications  ipratropium-albuterol  (DUONEB) 0.5-2.5 (3) MG/3ML nebulizer solution 3 mL (3 mLs Nebulization Given 12/14/23 2015)  methylPREDNISolone  sodium succinate (SOLU-MEDROL ) 125 mg/2 mL injection 125 mg (125 mg Intravenous Given 12/14/23 2036)  doxycycline  (VIBRA -TABS) tablet 100 mg (100 mg Oral Given 12/14/23 2101)      IMPRESSION / MDM / ASSESSMENT AND PLAN / ED COURSE                                Differential diagnosis includes, but is not limited to, COPD exacerbation, pneumonia, upper respiratory infection, anemia, electrolyte derangement  Patient is well-appearing and satting 98% on room air.  He does have a slight leukocytosis but no lactic acidosis no profound electrolyte derangements.  His respiratory panel was negative.  He did have improvement in symptoms after a nebulizing treatment.  Chest x-ray demonstrated no evidence of pneumonia and I did give him 125 mg of Solu-Medrol .  Given his COPD I did want to cover him with antibiotics however he is allergic to azithromycin  so decision made to use doxycycline .  Patient was able to ambulate without desatting and feels comfortable returning home.  I have given him 4 days of prednisone , 7 days of doxycycline  and refill of his nebulizing medications  At time of discharge there is no evidence of acute life, limb, vision, or fertility threat. Patient has stable vital signs, pain is well controlled, patient is ambulatory and p.o. tolerant.  Discharge instructions were completed using the [employer] system. I would refer you to those at this time. All warnings prescriptions follow-up etc. were discussed in detail with the patient. Patient indicates understanding and is agreeable with this plan. All questions answered.  Patient is made aware that they may return to the emergency department for any worsening or new condition or for any other emergency.    Clinical Course as of 12/14/23 2327  Fri Dec 14, 2023  1755 WBC(!): 16.3 Slightly elevated [HD]  1915 Chest x-ray is unremarkable and lactic acid is not elevated.  Will ambulate the patient to determine whether he desats [HD]  2007 Creatinine(!): 1.52 This appears to be at the patient's baseline [HD]  2053 Patient ambulated and never dropped his oxygen.  Will work on discharge with a 4-day course of  steroids.  Would have prescribed azithromycin  however he is allergic so we will give him a course of doxycycline  for COPD coverage.  Will encourage smoking cessation and return precautions [HD]  2103 Patient and wife feel improved feel comfortable with discharge.  Will follow-up with primary care physician return precautions reviewed and they voiced understanding [HD]    Clinical Course User Index [HD] Nicholaus Rolland BRAVO, MD   --  Risk: 5 This patient has a high risk of morbidity due to further diagnostic testing or treatment. Rationale: This patient's evaluation and management involve a high risk of morbidity due to the potential severity  of presenting symptoms, need for diagnostic testing, and/or initiation of treatment that may require close monitoring. The differential includes conditions with potential for significant deterioration or requiring escalation of care. Treatment decisions in the ED, including medication administration, procedural interventions, or disposition planning, reflect this level of risk. Additional Support: -- Drug therapy requiring intensive monitoring for toxicity [ ]  -- Decision regarding elective major surgery with idenitified patient or procedure risk factors [ ]  -- Decision regarding hospitalization or escalation of hospital-level care [ ]  -- Decision not to resuscitate or to de-escalate care because of poor prognosis [ ]  -- Parental controlled substances [ ]   COPA: 5 The patient has a severe exacerbation, progression, or side effect of treatment of the following illness/illnesses: []  OR  The patient has the following acute or chronic illness/injury that poses a possible threat to life or bodily function: [X] : The patient has a potentially serious acute condition or an acute exacerbation of a chronic illness requiring urgent evaluation and management in the Emergency Department. The clinical presentation necessitates immediate consideration of life-threatening or  function-threatening diagnoses, even if they are ultimately ruled out.  Data(2/3 categories following were performed): 5 I reviewed or ordered at least three unique tests, external notes, and/or the history required an independent historian as one of the three requirements as following: CBC, BMP, lactic acid, upper respiratory infection AND  I independently interpreted the following test: X-ray of the chest OR  I discussed the management of the patient with the following external physician or qualified healthcare provider: []    Suggested E/M Coding Level: 5, 99285, This has been selected based on the 01-10-2022 CPT guidelines for E/M codes in the Emergency Department based on 2/3 of the CoPA, Data, and Risk.   FINAL CLINICAL IMPRESSION(S) / ED DIAGNOSES   Final diagnoses:  COPD with acute exacerbation (HCC)  Shortness of breath  Subacute cough     Rx / DC Orders   ED Discharge Orders          Ordered    predniSONE  (DELTASONE ) 20 MG tablet  2 times daily with meals        12/14/23 01-11-2103    doxycycline  (VIBRA -TABS) 100 MG tablet  2 times daily        12/14/23 Jan 11, 2103    albuterol  (VENTOLIN  HFA) 108 (90 Base) MCG/ACT inhaler  Every 4 hours PRN        12/14/23 Jan 11, 2103    albuterol  (PROVENTIL ) (2.5 MG/3ML) 0.083% nebulizer solution  Every 4 hours PRN        12/14/23 2103/01/11             Note:  This document was prepared using Dragon voice recognition software and may include unintentional dictation errors.   Nicholaus Rolland BRAVO, MD 12/14/23 [phone]

## 2023-12-16 DIAGNOSIS — J449 Chronic obstructive pulmonary disease, unspecified: Secondary | ICD-10-CM | POA: Diagnosis not present

## 2023-12-17 MED ORDER — ATENOLOL 25 MG PO TABS: 25.0000 mg | ORAL_TABLET | Freq: Two times a day (BID) | ORAL | 1 refills | Status: DC

## 2023-12-19 ENCOUNTER — Ambulatory Visit: Payer: Self-pay | Admitting: Diagnostic Neuroimaging

## 2023-12-21 ENCOUNTER — Encounter: Payer: Self-pay | Admitting: Physician Assistant

## 2023-12-21 ENCOUNTER — Ambulatory Visit: Admitting: Physician Assistant

## 2023-12-21 VITALS — BP 130/81 | HR 65 | Resp 14 | Ht 70.0 in | Wt 195.2 lb

## 2023-12-21 DIAGNOSIS — G4739 Other sleep apnea: Secondary | ICD-10-CM | POA: Diagnosis not present

## 2023-12-21 DIAGNOSIS — J3089 Other allergic rhinitis: Secondary | ICD-10-CM | POA: Diagnosis not present

## 2023-12-21 DIAGNOSIS — B370 Candidal stomatitis: Secondary | ICD-10-CM | POA: Diagnosis not present

## 2023-12-21 DIAGNOSIS — F339 Major depressive disorder, recurrent, unspecified: Secondary | ICD-10-CM

## 2023-12-21 DIAGNOSIS — I10 Essential (primary) hypertension: Secondary | ICD-10-CM

## 2023-12-21 DIAGNOSIS — J441 Chronic obstructive pulmonary disease with (acute) exacerbation: Secondary | ICD-10-CM | POA: Diagnosis not present

## 2023-12-21 MED ORDER — TRELEGY ELLIPTA 100-62.5-25 MCG/ACT IN AEPB: 1.0000 | INHALATION_SPRAY | Freq: Every day | RESPIRATORY_TRACT | Status: DC

## 2023-12-21 NOTE — Progress Notes (Signed)
 Established patient visit  Patient: Henry Owens   DOB: 03/07/1961   63 y.o. Male  MRN: [id number] Visit Date: 12/21/2023  Today's healthcare provider: Jolynn Spencer, PA-C   Chief Complaint  Patient presents with   Follow-up    Er f/u 12/14/23.  States of feeling a little better since he left er   Hypertension   Subjective      Discussed the use of AI scribe software for clinical note transcription with the patient, who gave verbal consent to proceed.  History of Present Illness Henry Owens is a 64 year old male with hypertension and COPD who presents with concerns about blood pressure management and recent COPD exacerbation.  Blood pressure is currently 130/80 mmHg. He takes atenolol  50 mg daily. The dose was previously reduced due to lightheadedness but increased again due to rising blood pressure. Home readings are 130-140/80 mmHg in the morning and lower in the evening.  He experienced a COPD exacerbation last Friday, requiring an ER visit. He completed a prednisone  course and is finishing doxycycline . He uses inhalers and nebulizers, with improvement in chest tightness but persistent symptoms. Nebulizer use is reduced by choice.  He has oral thrush with a sensation of a lump in the throat when swallowing, breathing, or speaking.  Significant nasal congestion and post-nasal drainage are present. He is not using nasal saline rinse or Flonase .       12/21/2023    1:32 PM 11/12/2023    1:25 PM 06/29/2023    1:09 PM  Depression screen PHQ 2/9  Decreased Interest 2 2 2   Down, Depressed, Hopeless 2 2 2   PHQ - 2 Score 4 4 4   Altered sleeping 2 1 3   Tired, decreased energy 2 1 3   Change in appetite 2 0 0  Feeling bad or failure about yourself  2 2 2   Trouble concentrating 2 0 2  Moving slowly or fidgety/restless 1 0 0  Suicidal thoughts 0 0 0  PHQ-9 Score 15 8 14   Difficult doing work/chores Very difficult Somewhat difficult Very difficult       12/21/2023    1:33 PM 11/12/2023    1:26 PM 06/29/2023    1:09 PM 06/01/2023    1:55 PM  GAD 7 : Generalized Anxiety Score  Nervous, Anxious, on Edge 2 2 3 3   Control/stop worrying 2 2 3 3   Worry too much - different things 2 2 3 3   Trouble relaxing 2 2 3 3   Restless 2 1 0 0  Easily annoyed or irritable 2 1 0 0  Afraid - awful might happen 2 1 0 3  Total GAD 7 Score 14 11 12 15   Anxiety Difficulty Very difficult Not difficult at all Very difficult Very difficult    Medications: Outpatient Medications Prior to Visit  Medication Sig   acetaminophen  (TYLENOL ) 500 MG tablet Take 1,000 mg by mouth in the morning, at noon, and at bedtime.   albuterol  (PROVENTIL ) (2.5 MG/3ML) 0.083% nebulizer solution Take 3 mLs (2.5 mg total) by nebulization every 4 (four) hours as needed for wheezing or shortness of breath.   albuterol  (VENTOLIN  HFA) 108 (90 Base) MCG/ACT inhaler Inhale 2 puffs into the lungs every 4 (four) hours as needed for wheezing or shortness of breath.   aspirin  EC 81 MG tablet Take 81 mg by mouth daily. Swallow whole.   atenolol  (TENORMIN ) 25 MG tablet Take 1 tablet (25 mg total) by mouth 2 (two) times daily.   atorvastatin  (  LIPITOR) 20 MG tablet Take 1 tablet (20 mg total) by mouth daily.   Cholecalciferol (D 1000) 25 MCG (1000 UT) capsule Take 1,000 Units by mouth daily.   cyanocobalamin  1000 MCG tablet Take 1 tablet (1,000 mcg total) by mouth daily.   divalproex (DEPAKOTE) 125 MG DR tablet Take 250 mg by mouth 2 (two) times daily.   doxycycline  (VIBRA -TABS) 100 MG tablet Take 1 tablet (100 mg total) by mouth 2 (two) times daily for 7 days.   fenofibrate  (TRICOR ) 48 MG tablet TAKE 1 TABLET(48 MG) BY MOUTH DAILY   fluticasone  (FLONASE ) 50 MCG/ACT nasal spray Place 1 spray into both nostrils daily as needed for allergies or rhinitis.   gabapentin  (NEURONTIN ) 300 MG capsule Take 3 capsules (900 mg total) by mouth 2 (two) times daily.   nortriptyline (PAMELOR) 10 MG capsule Take 10 mg by  mouth daily.   OXYGEN Inhale into the lungs. As needed Has not used in a year   pantoprazole  (PROTONIX ) 40 MG tablet TAKE 1 TABLET(40 MG) BY MOUTH DAILY   QUEtiapine (SEROQUEL) 25 MG tablet Take 75 mg by mouth at bedtime.   Spacer/Aero-Holding Raguel FRENCH Use as directed for inhaler use   Tiotropium Bromide-Olodaterol (STIOLTO RESPIMAT ) 2.5-2.5 MCG/ACT AERS Inhale 2 puffs into the lungs daily.   Tiotropium Bromide-Olodaterol (STIOLTO RESPIMAT ) 2.5-2.5 MCG/ACT AERS Inhale 2 puffs into the lungs daily.   tiZANidine  (ZANAFLEX ) 4 MG tablet Take 4 mg by mouth 3 (three) times daily.   venlafaxine  XR (EFFEXOR  XR) 75 MG 24 hr capsule Take 1 capsule (75 mg total) by mouth daily with breakfast.   No facility-administered medications prior to visit.    Review of Systems  All other systems reviewed and are negative.  All negative Except see HPI       Objective    BP 130/81 (BP Location: Right Arm, Patient Position: Sitting, Cuff Size: Normal)   Pulse 65   Resp 14   Ht 5' 10 (1.778 m)   Wt 195 lb 3.2 oz (88.5 kg)   BMI 28.01 kg/m     Physical Exam Vitals reviewed.  Constitutional:      General: He is not in acute distress.    Appearance: Normal appearance. He is not diaphoretic.  HENT:     Head: Normocephalic and atraumatic.  Eyes:     General: No scleral icterus.    Conjunctiva/sclera: Conjunctivae normal.  Cardiovascular:     Rate and Rhythm: Normal rate and regular rhythm.     Pulses: Normal pulses.     Heart sounds: Normal heart sounds. No murmur heard. Pulmonary:     Effort: Pulmonary effort is normal. No respiratory distress.     Breath sounds: Normal breath sounds. No wheezing or rhonchi.  Musculoskeletal:     Cervical back: Neck supple.     Right lower leg: No edema.     Left lower leg: No edema.  Lymphadenopathy:     Cervical: No cervical adenopathy.  Skin:    General: Skin is warm and dry.     Findings: No rash.  Neurological:     Mental Status: He is alert  and oriented to person, place, and time. Mental status is at baseline.  Psychiatric:        Mood and Affect: Mood normal.        Behavior: Behavior normal.      No results found for any visits on 12/21/23.      Assessment & Plan Chronic obstructive pulmonary disease  with recent exacerbation Recent exacerbation treated with prednisone  and doxycycline . Symptoms improved but not fully resolved. Pulmonologist prescribed Respimat. Considering Trelegy for better control. - Continue current inhalers and nebulizers. - Trial Trelegy sample for symptom control. - Schedule follow-up if symptoms persist or worsen. - Consider stronger medication if Trelegy is ineffective. Will follow-up  Oral candidiasis (thrush) Likely secondary to recent prednisone  and inhaler use. Symptoms include mouth discomfort and possible throat lump sensation. - Use prescribed mouthwash for thrush. - Consume yogurt to maintain gut flora. - Rinse mouth after inhaler use.  Essential hypertension Chronic and unstable Blood pressure at 130/80 mmHg, slightly above target. Currently on atenolol  50 mg once daily. Blood pressure higher in the morning. - Continue atenolol  50 mg once daily. - Monitor blood pressure at home. - Report any concerning changes in blood pressure. Consider an extra medication Will follow-up  Allergic rhinitis with nasal congestion and postnasal drainage Significant nasal congestion and postnasal drainage. No current use of nasal saline rinse or antihistamines. - Use nasal saline rinse. - Start Flonase  for nasal congestion. - Take Zyrtec for allergy symptoms.  General Health Maintenance Discussed vaccinations including flu, COVID, shingles, and tetanus. Advised to delay vaccinations until full recovery from recent flare up of copd. Emphasized importance of flu and COVID vaccines due to respiratory issues. - Consider flu and COVID vaccines  - Discuss shingles and tetanus vaccines based on  personal preference.  Depression, recurrent (HCC) Chronic Stable    12/21/2023    1:32 PM 11/12/2023    1:25 PM 06/29/2023    1:09 PM  PHQ9 SCORE ONLY  PHQ-9 Total Score 15 8  14       Data saved with a previous flowsheet row definition   Continue current dose of effexor  of 75mg  daily Will follow-up  Other sleep apnea Advised to proceed with pulmonology Will recheck with our clinic scheduler.  No orders of the defined types were placed in this encounter.   No follow-ups on file.   The patient was advised to call back or seek an in-person evaluation if the symptoms worsen or if the condition fails to improve as anticipated.  I discussed the assessment and treatment plan with the patient. The patient was provided an opportunity to ask questions and all were answered. The patient agreed with the plan and demonstrated an understanding of the instructions.  I, Tata Timmins, PA-C have reviewed all documentation for this visit. The documentation on 12/21/2023  for the exam, diagnosis, procedures, and orders are all accurate and complete.  Jolynn Spencer, [facility], MMS [facility] [phone] (phone) [phone] (fax)  [facility]

## 2023-12-24 DIAGNOSIS — B370 Candidal stomatitis: Secondary | ICD-10-CM | POA: Insufficient documentation

## 2023-12-25 ENCOUNTER — Ambulatory Visit: Admitting: Physician Assistant

## 2023-12-25 ENCOUNTER — Telehealth: Payer: Self-pay | Admitting: Diagnostic Neuroimaging

## 2023-12-25 ENCOUNTER — Other Ambulatory Visit: Payer: Self-pay | Admitting: Neurology

## 2023-12-25 DIAGNOSIS — I10 Essential (primary) hypertension: Secondary | ICD-10-CM

## 2023-12-25 DIAGNOSIS — J441 Chronic obstructive pulmonary disease with (acute) exacerbation: Secondary | ICD-10-CM

## 2023-12-25 DIAGNOSIS — B370 Candidal stomatitis: Secondary | ICD-10-CM

## 2023-12-25 NOTE — Telephone Encounter (Signed)
 Pt called to request  medication refill the patient states that  MD is suppose to be taking care of future refill.   tiZANidine  (ZANAFLEX ) 4 MG tablet   Pt medication is to be sent to   [id number] [address], [place] - [address] AT [facility] OF [address] Phone: [phone]  Fax: [phone]

## 2023-12-25 NOTE — Telephone Encounter (Signed)
 Pt is calling in requesting refill for tizanidine . This was previously prescribed by Dr Lane. He had also reached out end of August about the nortriptyline refill being needed.   Per last note with Dr Lane in July -Start Depakote 125 mg twice daily for two weeks and then increase to 250 mg twice daily for near syncope and mood.  -Continue taking Seroquel 75 mg at night for sleep difficulty.  -Continue taking Nortriptyline 20 mg at night.'  At the visit with Dr Margaret there wasn't discussion about the additional medications being taken over. Tizanidine  wasn't mentioned in Dr Clement note but it was last filled 12/18/2023 for a 30 day supply by Dr Lane (90 tab) went to [facility] on [address].  The nortriptyline was last filled 12/01/23 for 60 capsules.

## 2023-12-26 NOTE — Telephone Encounter (Signed)
 Attempted to call Pt. No answer and VM full, unable to leave message. Will send [facility] message.

## 2024-01-01 ENCOUNTER — Encounter: Payer: Self-pay | Admitting: Physician Assistant

## 2024-01-01 ENCOUNTER — Ambulatory Visit: Admitting: Physician Assistant

## 2024-01-01 VITALS — BP 118/84 | HR 69 | Temp 98.3°F | Resp 16 | Ht 70.0 in | Wt 193.7 lb

## 2024-01-01 DIAGNOSIS — J441 Chronic obstructive pulmonary disease with (acute) exacerbation: Secondary | ICD-10-CM

## 2024-01-01 DIAGNOSIS — Z720 Tobacco use: Secondary | ICD-10-CM

## 2024-01-01 DIAGNOSIS — J3089 Other allergic rhinitis: Secondary | ICD-10-CM | POA: Diagnosis not present

## 2024-01-01 DIAGNOSIS — J309 Allergic rhinitis, unspecified: Secondary | ICD-10-CM | POA: Insufficient documentation

## 2024-01-01 MED ORDER — TRELEGY ELLIPTA 200-62.5-25 MCG/ACT IN AEPB: 200.0000 ug | INHALATION_SPRAY | RESPIRATORY_TRACT | Status: DC | PRN

## 2024-01-01 NOTE — Progress Notes (Signed)
 Established patient visit  Patient: Henry Owens   DOB: [birth year]   63 y.o. Male  MRN: [id number] Visit Date: 01/01/2024  Today's healthcare provider: Jolynn Spencer, PA-C   Chief Complaint  Patient presents with   COPD    No improvement in breathing from last OV   Subjective     HPI     COPD    Additional comments: No improvement in breathing from last OV      Last edited by Wilfred Hargis RAMAN, CMA on 01/01/2024  1:07 PM.       Discussed the use of AI scribe software for clinical note transcription with the patient, who gave verbal consent to proceed.  History of Present Illness Henry Owens is a 63 year old male with emphysema who presents with persistent breathing difficulties.  He experiences wheezing and a persistent wet cough that have not improved since a recent exacerbation. The new inhaler prescribed has not provided significant relief. Previously, a double inhaler was more effective. He takes Zyrtec at night but has not been using albuterol  as frequently as recommended. He continues to smoke, having reduced to one pack per day as of yesterday.       12/21/2023    1:32 PM 11/12/2023    1:25 PM 06/29/2023    1:09 PM  Depression screen PHQ 2/9  Decreased Interest 2 2 2   Down, Depressed, Hopeless 2 2 2   PHQ - 2 Score 4 4 4   Altered sleeping 2 1 3   Tired, decreased energy 2 1 3   Change in appetite 2 0 0  Feeling bad or failure about yourself  2 2 2   Trouble concentrating 2 0 2  Moving slowly or fidgety/restless 1 0 0  Suicidal thoughts 0 0 0  PHQ-9 Score 15 8 14   Difficult doing work/chores Very difficult Somewhat difficult Very difficult      12/21/2023    1:33 PM 11/12/2023    1:26 PM 06/29/2023    1:09 PM 06/01/2023    1:55 PM  GAD 7 : Generalized Anxiety Score  Nervous, Anxious, on Edge 2 2 3 3   Control/stop worrying 2 2 3 3   Worry too much - different things 2 2 3 3   Trouble relaxing 2 2 3 3   Restless 2 1 0 0  Easily annoyed or  irritable 2 1 0 0  Afraid - awful might happen 2 1 0 3  Total GAD 7 Score 14 11 12 15   Anxiety Difficulty Very difficult Not difficult at all Very difficult Very difficult    Medications: Outpatient Medications Prior to Visit  Medication Sig   acetaminophen  (TYLENOL ) 500 MG tablet Take 1,000 mg by mouth in the morning, at noon, and at bedtime.   albuterol  (PROVENTIL ) (2.5 MG/3ML) 0.083% nebulizer solution Take 3 mLs (2.5 mg total) by nebulization every 4 (four) hours as needed for wheezing or shortness of breath.   albuterol  (VENTOLIN  HFA) 108 (90 Base) MCG/ACT inhaler Inhale 2 puffs into the lungs every 4 (four) hours as needed for wheezing or shortness of breath.   aspirin  EC 81 MG tablet Take 81 mg by mouth daily. Swallow whole.   atenolol  (TENORMIN ) 25 MG tablet Take 1 tablet (25 mg total) by mouth 2 (two) times daily.   atorvastatin  (LIPITOR) 20 MG tablet Take 1 tablet (20 mg total) by mouth daily.   Cholecalciferol (D 1000) 25 MCG (1000 UT) capsule Take 1,000 Units by mouth daily.   cyanocobalamin  1000 MCG tablet  Take 1 tablet (1,000 mcg total) by mouth daily.   divalproex (DEPAKOTE) 125 MG DR tablet Take 250 mg by mouth 2 (two) times daily.   fenofibrate  (TRICOR ) 48 MG tablet TAKE 1 TABLET(48 MG) BY MOUTH DAILY   fluticasone  (FLONASE ) 50 MCG/ACT nasal spray Place 1 spray into both nostrils daily as needed for allergies or rhinitis.   Fluticasone -Umeclidin-Vilant (TRELEGY ELLIPTA ) 100-62.5-25 MCG/ACT AEPB Inhale 1 puff into the lungs daily.   gabapentin  (NEURONTIN ) 300 MG capsule Take 3 capsules (900 mg total) by mouth 2 (two) times daily.   nortriptyline (PAMELOR) 10 MG capsule Take 10 mg by mouth daily.   OXYGEN Inhale into the lungs. As needed Has not used in a year   pantoprazole  (PROTONIX ) 40 MG tablet TAKE 1 TABLET(40 MG) BY MOUTH DAILY   QUEtiapine (SEROQUEL) 25 MG tablet Take 75 mg by mouth at bedtime.   Spacer/Aero-Holding Raguel FRENCH Use as directed for inhaler use    Tiotropium Bromide-Olodaterol (STIOLTO RESPIMAT ) 2.5-2.5 MCG/ACT AERS Inhale 2 puffs into the lungs daily.   Tiotropium Bromide-Olodaterol (STIOLTO RESPIMAT ) 2.5-2.5 MCG/ACT AERS Inhale 2 puffs into the lungs daily.   tiZANidine  (ZANAFLEX ) 4 MG tablet Take 4 mg by mouth 3 (three) times daily.   venlafaxine  XR (EFFEXOR  XR) 75 MG 24 hr capsule Take 1 capsule (75 mg total) by mouth daily with breakfast.   No facility-administered medications prior to visit.    Review of Systems  All other systems reviewed and are negative.  All negative Except see HPI       Objective    BP 118/84   Pulse 69   Temp 98.3 F (36.8 C) (Oral)   Resp 16   Ht 5' 10 (1.778 m)   Wt 193 lb 11.2 oz (87.9 kg)   SpO2 97%   BMI 27.79 kg/m     Physical Exam Vitals reviewed.  Constitutional:      General: He is not in acute distress.    Appearance: Normal appearance. He is not diaphoretic.  HENT:     Head: Normocephalic and atraumatic.  Eyes:     General: No scleral icterus.    Conjunctiva/sclera: Conjunctivae normal.  Cardiovascular:     Rate and Rhythm: Normal rate and regular rhythm.     Pulses: Normal pulses.     Heart sounds: Normal heart sounds. No murmur heard. Pulmonary:     Effort: Pulmonary effort is normal. No respiratory distress.     Breath sounds: Rhonchi (intermittent) present. No wheezing.  Musculoskeletal:     Cervical back: Neck supple.     Right lower leg: No edema.     Left lower leg: No edema.  Lymphadenopathy:     Cervical: No cervical adenopathy.  Skin:    General: Skin is warm and dry.     Findings: No rash.  Neurological:     Mental Status: He is alert and oriented to person, place, and time. Mental status is at baseline.  Psychiatric:        Mood and Affect: Mood normal.        Behavior: Behavior normal.      No results found for any visits on 01/01/24.       Assessment & Plan Chronic obstructive pulmonary disease (COPD) with emphysema COPD with  emphysema exacerbation.  Improving Recent spirometry confirmed emphysema. Trelegy helped. Oxygen saturation is optimal. - Prescribe stronger dose of Trelegy inhaler. - Instruct to use albuterol  every 4 to 6 hours for cough, wheezing, and shortness  of breath. - Advise to contact pulmonologist for follow-up, especially if symptoms persist or worsen. - Instruct to email if symptoms do not improve in 2-3 days for potential antibiotic prescription.  Tobacco abuse Continued smoking, currently reduced to one pack per day. Smoking contributes to COPD exacerbation and overall respiratory health decline. Smoking cessation is crucial to improve respiratory health. - Strongly advise smoking cessation. - Discuss CDC's national quit smoking program and available medications, including free options for the first two weeks.  Allergic rhinitis Presence of nasal drainage and softer symptoms. - Continue Zyrtec at night. Continue symptomatic treatment. Will follow-up   No orders of the defined types were placed in this encounter.   No follow-ups on file.   The patient was advised to call back or seek an in-person evaluation if the symptoms worsen or if the condition fails to improve as anticipated.  I discussed the assessment and treatment plan with the patient. The patient was provided an opportunity to ask questions and all were answered. The patient agreed with the plan and demonstrated an understanding of the instructions.  I, Ludell Zacarias, PA-C have reviewed all documentation for this visit. The documentation on 01/01/2024  for the exam, diagnosis, procedures, and orders are all accurate and complete.  Jolynn Spencer, [facility], MMS [facility] [phone] (phone) [phone] (fax)  [facility]

## 2024-01-08 ENCOUNTER — Ambulatory Visit: Admitting: Sleep Medicine

## 2024-01-08 ENCOUNTER — Encounter: Payer: Self-pay | Admitting: Sleep Medicine

## 2024-01-08 VITALS — BP 116/62 | HR 68 | Temp 97.1°F | Ht 70.0 in | Wt 194.0 lb

## 2024-01-08 DIAGNOSIS — G4733 Obstructive sleep apnea (adult) (pediatric): Secondary | ICD-10-CM

## 2024-01-08 DIAGNOSIS — J4489 Other specified chronic obstructive pulmonary disease: Secondary | ICD-10-CM | POA: Diagnosis not present

## 2024-01-08 DIAGNOSIS — J439 Emphysema, unspecified: Secondary | ICD-10-CM

## 2024-01-08 DIAGNOSIS — I10 Essential (primary) hypertension: Secondary | ICD-10-CM | POA: Diagnosis not present

## 2024-01-08 NOTE — Patient Instructions (Signed)
 Will complete in lab sleep study and follow up to review results.

## 2024-01-08 NOTE — Progress Notes (Signed)
 Name:Henry Owens MRN: [id number] DOB: Sep 04, 1960   CHIEF COMPLAINT:  ESTABLISH CARE FOR OSA   HISTORY OF PRESENT ILLNESS: Mr. Mogel is a 63 y.o. w/ a h/o OSA, HTN, COPD, epilepsy, anxiety, depression and who presents for reassessment of OSA. Reports that he was initially diagnosed with severe OSA in 2024 and subsequently started on BIPAP therapy. Reports using BIPAP therapy for a brief period and discontinued use due to pressure discomfort. States that he used a full face mask, which was uncomfortable. Denies any significant weight changes. Reports c/o loud snoring, witnessed apnea and excessive daytime sleepiness. Denies nocturnal awakenings. Denies morning headaches, RLS symptoms, dream enactment, cataplexy, hypnagogic or hypnapompic hallucinations. Denies a family history of sleep apnea. Denies drowsy driving. Drinks 1 gallon of tea daily, denies alcohol or illicit drug use. Smokes 1-2 ppd tobacco use.   Bedtime 11:30 pm-12:30 Sleep onset 20 mins Rise time 8-11 am   EPWORTH SLEEP SCORE 11    01/08/2024    2:24 PM  Results of the Epworth flowsheet  Sitting and reading 3  Watching TV 3  Sitting, inactive in a public place (e.g. a theatre or a meeting) 0  As a passenger in a car for an hour without a break 0  Lying down to rest in the afternoon when circumstances permit 3  Sitting and talking to someone 0  Sitting quietly after a lunch without alcohol 2  In a car, while stopped for a few minutes in traffic 0  Total score 11    PAST MEDICAL HISTORY :   has a past medical history of Allergy (2020), Anxiety, Arthritis, Blood transfusion without reported diagnosis (11/2022), Breast cancer (HCC), COPD (chronic obstructive pulmonary disease) (HCC) (2023), Depression, Emphysema of lung (HCC), Flu (07/2017), GERD (gastroesophageal reflux disease) (2023), Hyperlipidemia, Hypertension, Intractable nausea and vomiting (12/11/2022), Nodule of right lung, Oxygen deficiency (2023),  Sleep apnea (2024), Stroke (HCC) (11/2022), and Tinnitus.  has a past surgical history that includes Back surgery; Knee surgery (1975); Appendectomy (1975); Knee arthroscopy with medial menisectomy (Right, 08/20/2017); Spine surgery (9 1 89); Colonoscopy with propofol  (N/A, 05/18/2023); Esophagogastroduodenoscopy (egd) with propofol  (N/A, 05/18/2023); polypectomy (05/18/2023); Submucosal lifting injection (05/18/2023); Hemostasis clip placement (05/18/2023); and Colonoscopy (N/A, 11/19/2023). Prior to Admission medications   Medication Sig Start Date End Date Taking? Authorizing Provider  acetaminophen  (TYLENOL ) 500 MG tablet Take 1,000 mg by mouth in the morning, at noon, and at bedtime.   Yes [provider]  albuterol  (PROVENTIL ) (2.5 MG/3ML) 0.083% nebulizer solution Take 3 mLs (2.5 mg total) by nebulization every 4 (four) hours as needed for wheezing or shortness of breath. 12/14/23 12/13/24 Yes Nicholaus Rolland BRAVO, MD  albuterol  (VENTOLIN  HFA) 108 (90 Base) MCG/ACT inhaler Inhale 2 puffs into the lungs every 4 (four) hours as needed for wheezing or shortness of breath. 12/14/23  Yes Nicholaus Rolland BRAVO, MD  aspirin  EC 81 MG tablet Take 81 mg by mouth daily. Swallow whole.   Yes [provider]  atenolol  (TENORMIN ) 25 MG tablet Take 1 tablet (25 mg total) by mouth 2 (two) times daily. 12/17/23  Yes Ostwalt, Janna, PA-C  atorvastatin  (LIPITOR) 20 MG tablet Take 1 tablet (20 mg total) by mouth daily. 08/09/23  Yes Ostwalt, Janna, PA-C  Cholecalciferol (D 1000) 25 MCG (1000 UT) capsule Take 1,000 Units by mouth daily.   Yes [provider]  cyanocobalamin  1000 MCG tablet Take 1 tablet (1,000 mcg total) by mouth daily. 12/15/22  Yes Zhang,  Dekui, MD  divalproex (DEPAKOTE) 125 MG DR tablet Take 250 mg by mouth 2 (two) times daily.   Yes [provider]  fenofibrate  (TRICOR ) 48 MG tablet TAKE 1 TABLET(48 MG) BY MOUTH DAILY 11/05/23  Yes Ostwalt, Janna, PA-C  fluticasone  (FLONASE )  50 MCG/ACT nasal spray Place 1 spray into both nostrils daily as needed for allergies or rhinitis.   Yes [provider]  Fluticasone -Umeclidin-Vilant (TRELEGY ELLIPTA ) 100-62.5-25 MCG/ACT AEPB Inhale 1 puff into the lungs daily. 12/21/23  Yes Ostwalt, Janna, PA-C  Fluticasone -Umeclidin-Vilant (TRELEGY ELLIPTA ) 200-62.5-25 MCG/ACT AEPB Inhale 200 mcg into the lungs as needed. 01/01/24  Yes Ostwalt, Janna, PA-C  gabapentin  (NEURONTIN ) 300 MG capsule Take 3 capsules (900 mg total) by mouth 2 (two) times daily. 10/27/23  Yes Maree Hue, MD  nortriptyline (PAMELOR) 10 MG capsule Take 10 mg by mouth daily. 12/01/23  Yes [provider]  OXYGEN Inhale into the lungs. As needed Has not used in a year   Yes [provider]  pantoprazole  (PROTONIX ) 40 MG tablet TAKE 1 TABLET(40 MG) BY MOUTH DAILY 10/22/23  Yes Ostwalt, Janna, PA-C  QUEtiapine (SEROQUEL) 25 MG tablet Take 75 mg by mouth at bedtime.   Yes [provider]  Spacer/Aero-Holding Raguel FRENCH Use as directed for inhaler use 07/27/23  Yes Tamea Dedra CROME, MD  Tiotropium Bromide-Olodaterol (STIOLTO RESPIMAT ) 2.5-2.5 MCG/ACT AERS Inhale 2 puffs into the lungs daily. 11/27/23  Yes Tamea Dedra CROME, MD  Tiotropium Bromide-Olodaterol (STIOLTO RESPIMAT ) 2.5-2.5 MCG/ACT AERS Inhale 2 puffs into the lungs daily. 11/27/23  Yes Tamea Dedra CROME, MD  tiZANidine  (ZANAFLEX ) 4 MG tablet Take 4 mg by mouth 3 (three) times daily. 11/24/22  Yes [provider]  venlafaxine  XR (EFFEXOR  XR) 75 MG 24 hr capsule Take 1 capsule (75 mg total) by mouth daily with breakfast. 12/07/23  Yes Ostwalt, Janna, PA-C   Allergies  Allergen Reactions   Wound Dressing Adhesive Other (See Comments)    BLISTER   Azithromycin  Rash and Dermatitis   Penicillin G Rash    Has patient had a PCN reaction causing immediate rash, facial/tongue/throat swelling, SOB or lightheadedness with hypotension: Yes Has patient had a PCN reaction causing severe rash  involving mucus membranes or skin necrosis: No Has patient had a PCN reaction that required hospitalization: No Has patient had a PCN reaction occurring within the last 10 years: No If all of the above answers are NO, then may proceed with Cephalosporin use.     FAMILY HISTORY:  family history includes Anxiety disorder in his daughter and son; Arthritis in his father and mother; Cancer in his mother; Depression in his mother; Hearing loss in his father; Hypertension in his mother; Obesity in his sister; Stroke in his father and mother. SOCIAL HISTORY:  reports that he has been smoking cigarettes. He started smoking about 43 years ago. He has a 84.5 pack-year smoking history. He has never used smokeless tobacco. He reports that he does not currently use drugs after having used the following drugs: Marijuana. He reports that he does not drink alcohol.   Review of Systems:  Gen:  Denies  fever, sweats, chills weight loss  HEENT: Denies blurred vision, double vision, ear pain, eye pain, hearing loss, nose bleeds, sore throat Cardiac:  No dizziness, chest pain or heaviness, chest tightness,edema, No JVD Resp:   No cough, -sputum production, -shortness of breath,-wheezing, -hemoptysis,  Gi: Denies swallowing difficulty, stomach pain, nausea or vomiting, diarrhea, constipation, bowel incontinence Gu:  Denies bladder  incontinence, burning urine Ext:   Denies Joint pain, stiffness or swelling Skin: Denies  skin rash, easy bruising or bleeding or hives Endoc:  Denies polyuria, polydipsia , polyphagia or weight change Psych:   Denies depression, insomnia or hallucinations  Other:  All other systems negative  VITAL SIGNS: BP 116/62   Pulse 68   Temp (!) 97.1 F (36.2 C)   Ht 5' 10 (1.778 m)   Wt 194 lb (88 kg)   SpO2 98%   BMI 27.84 kg/m    Physical Examination:   General Appearance: No distress  EYES PERRLA, EOM intact.   NECK Supple, No JVD Pulmonary: normal breath sounds, No  wheezing.  CardiovascularNormal S1,S2.  No m/r/g.   Abdomen: Benign, Soft, non-tender. Skin:   warm, no rashes, no ecchymosis  Extremities: normal, no cyanosis, clubbing. Neuro:without focal findings,  speech normal  PSYCHIATRIC: Mood, affect within normal limits.   ASSESSMENT AND PLAN  OSA Will complete a CPAP/BIPAP titration study for reassessment and follow up to review results. Discussed the consequences of untreated sleep apnea. Advised not to drive drowsy for safety of patient and others. Will complete further evaluation with CPAP/BIPAP titration study and follow up to review results.    HTN Stable, on current management. Following with PCP.   COPD Stable, on current management.    MEDICATION ADJUSTMENTS/LABS AND TESTS ORDERED: Recommend Sleep Study   Patient  satisfied with Plan of action and management. All questions answered  Follow up to review titration study results and treatment plan.   I spent a total of 49 minutes reviewing chart data, face-to-face evaluation with the patient, counseling and coordination of care as detailed above.    Meilani Edmundson, M.D.  Sleep Medicine [facility]

## 2024-01-10 ENCOUNTER — Other Ambulatory Visit: Payer: Self-pay | Admitting: Physician Assistant

## 2024-01-10 ENCOUNTER — Encounter: Payer: Self-pay | Admitting: Gastroenterology

## 2024-01-10 NOTE — Telephone Encounter (Unsigned)
 Copied from CRM [id number]. Topic: Clinical - Medication Refill >> Jan 10, 2024 10:11 AM Nathanel DEL wrote: Medication:  Disp Refills Start End  divalproex (DEPAKOTE) 125 MG DR tablet      Has the patient contacted their pharmacy? Yes But [facility]:  [id number] [place] [place] - but pt has changed pharmacies and now goes to [address] [place] [address] Phone: [phone] Fax: [phone]  Is this the correct pharmacy for this prescription? Yes If no, delete pharmacy and type the correct one.   Has the prescription been filled recently? Yes  Is the patient out of the medication? Yes  Has the patient been seen for an appointment in the last year OR does the patient have an upcoming appointment? Yes  Can we respond through MyChart? No  Pt states he is out of this med, and needs asap.  But I do not see Dr Enis has ever filled this med for him.  Then he asked if I could send a message and have Dr Donita fill it next time.  I advised pt he would need to call their office and ask.  Pt verbalized understanding.

## 2024-01-11 NOTE — Telephone Encounter (Signed)
 Requested medication (s) are due for refill today: routing for review  Requested medication (s) are on the active medication list: no  Last refill:  11/27/23  Future visit scheduled: no  Notes to clinic:  Unable to refill per protocol, Rx expired. Historical medication.      Requested Prescriptions  Pending Prescriptions Disp Refills   divalproex (DEPAKOTE) 125 MG DR tablet      Sig: Take 2 tablets (250 mg total) by mouth 2 (two) times daily.     Neurology:  Anticonvulsants - Valproates Failed - 01/11/2024  9:08 AM      Failed - WBC in normal range and within 360 days    WBC  Date Value Ref Range Status  12/14/2023 16.3 (H) 4.0 - 10.5 K/uL Final         Failed - Valproic Acid (serum) in normal range and within 360 days    No results found for: VALPROATE, VPAT       Passed - AST in normal range and within 360 days    AST  Date Value Ref Range Status  10/27/2023 18 15 - 41 U/L Final         Passed - ALT in normal range and within 360 days    ALT  Date Value Ref Range Status  10/27/2023 18 0 - 44 U/L Final         Passed - HGB in normal range and within 360 days    Hemoglobin  Date Value Ref Range Status  12/14/2023 14.3 13.0 - 17.0 g/dL Final  97/96/7974 84.5 13.0 - 17.7 g/dL Final         Passed - PLT in normal range and within 360 days    Platelets  Date Value Ref Range Status  12/14/2023 243 150 - 400 K/uL Final  05/28/2023 219 150 - 450 x10E3/uL Final         Passed - HCT in normal range and within 360 days    HCT  Date Value Ref Range Status  12/14/2023 44.8 39.0 - 52.0 % Final   Hematocrit  Date Value Ref Range Status  05/28/2023 46.6 37.5 - 51.0 % Final         Passed - Completed PHQ-2 or PHQ-9 in the last 360 days      Passed - Patient is not pregnant      Passed - Valid encounter within last 12 months    Recent Outpatient Visits           1 week ago Non-seasonal allergic rhinitis due to other allergic trigger   [place] [facility] Waldron, Morrison, PA-C   3 weeks ago Essential hypertension   [place] [facility] Riva, [place], PA-C   2 months ago Essential hypertension   [place] [facility] Lyndon, [place], PA-C   2 months ago Mixed hyperlipidemia   [place] [facility] Beaver, [place], PA-C   6 months ago [place]   [facility] [place], [place], [place]       Future Appointments             In 1 week McGowan, Clotilda DELENA RIGGERS [facility] [place]   In 5 months Penumalli, Eduard SAUNDERS, MD [facility]

## 2024-01-16 ENCOUNTER — Encounter: Payer: Self-pay | Admitting: Diagnostic Neuroimaging

## 2024-01-16 DIAGNOSIS — J449 Chronic obstructive pulmonary disease, unspecified: Secondary | ICD-10-CM | POA: Diagnosis not present

## 2024-01-17 ENCOUNTER — Ambulatory Visit: Admitting: Physician Assistant

## 2024-01-17 ENCOUNTER — Encounter: Payer: Self-pay | Admitting: Physician Assistant

## 2024-01-17 VITALS — BP 125/82 | HR 65 | Resp 16 | Ht 70.0 in | Wt 194.9 lb

## 2024-01-17 DIAGNOSIS — M545 Low back pain, unspecified: Secondary | ICD-10-CM

## 2024-01-17 DIAGNOSIS — G8929 Other chronic pain: Secondary | ICD-10-CM

## 2024-01-17 DIAGNOSIS — J441 Chronic obstructive pulmonary disease with (acute) exacerbation: Secondary | ICD-10-CM

## 2024-01-17 DIAGNOSIS — Z9181 History of falling: Secondary | ICD-10-CM

## 2024-01-17 DIAGNOSIS — J3089 Other allergic rhinitis: Secondary | ICD-10-CM

## 2024-01-17 DIAGNOSIS — M542 Cervicalgia: Secondary | ICD-10-CM | POA: Diagnosis not present

## 2024-01-17 DIAGNOSIS — Z720 Tobacco use: Secondary | ICD-10-CM | POA: Diagnosis not present

## 2024-01-17 MED ORDER — TRELEGY ELLIPTA 200-62.5-25 MCG/ACT IN AEPB: 1.0000 | INHALATION_SPRAY | RESPIRATORY_TRACT | Status: DC

## 2024-01-17 MED ORDER — PREDNISONE 20 MG PO TABS: 20.0000 mg | ORAL_TABLET | Freq: Two times a day (BID) | ORAL | 0 refills | Status: DC

## 2024-01-17 NOTE — Progress Notes (Unsigned)
 Established patient visit  Patient: Henry Owens   DOB: 06-27-1960   63 y.o. Male  MRN: [id number] Visit Date: 01/17/2024  Today's healthcare provider: Jolynn Spencer, PA-C   Chief Complaint  Patient presents with   Follow-up    1 mon f/u- pt states doing better. Trelegy has helped   Subjective     HPI     Follow-up    Additional comments: 1 mon f/u- pt states doing better. Trelegy has helped      Last edited by Wilfred Hargis RAMAN, CMA on 01/17/2024  1:15 PM.       Discussed the use of AI scribe software for clinical note transcription with the patient, who gave verbal consent to proceed.  History of Present Illness Henry Owens is a 63 year old male with COPD who presents with ongoing respiratory issues and medication management.  He uses Trelegy 200 mcg for COPD, but there is confusion about insurance coverage and the prescribing specialist. He experiences shortness of breath intermittently and uses albuterol  a couple of times daily, though not consistently. He is not using his nebulizer as recommended.  He has ongoing issues with his CPAP machine due to a lack of communication regarding a sleep study appointment, possibly due to a full voicemail. He experiences a wet cough and a runny nose but denies pain when swallowing. He has not been using Flonase  or nasal saline rinses as previously discussed.       12/21/2023    1:32 PM 11/12/2023    1:25 PM 06/29/2023    1:09 PM  Depression screen PHQ 2/9  Decreased Interest 2 2 2   Down, Depressed, Hopeless 2 2 2   PHQ - 2 Score 4 4 4   Altered sleeping 2 1 3   Tired, decreased energy 2 1 3   Change in appetite 2 0 0  Feeling bad or failure about yourself  2 2 2   Trouble concentrating 2 0 2  Moving slowly or fidgety/restless 1 0 0  Suicidal thoughts 0 0 0  PHQ-9 Score 15 8 14   Difficult doing work/chores Very difficult Somewhat difficult Very difficult      12/21/2023    1:33 PM 11/12/2023    1:26 PM  06/29/2023    1:09 PM 06/01/2023    1:55 PM  GAD 7 : Generalized Anxiety Score  Nervous, Anxious, on Edge 2 2 3 3   Control/stop worrying 2 2 3 3   Worry too much - different things 2 2 3 3   Trouble relaxing 2 2 3 3   Restless 2 1 0 0  Easily annoyed or irritable 2 1 0 0  Afraid - awful might happen 2 1 0 3  Total GAD 7 Score 14 11 12 15   Anxiety Difficulty Very difficult Not difficult at all Very difficult Very difficult    Medications: Outpatient Medications Prior to Visit  Medication Sig   acetaminophen  (TYLENOL ) 500 MG tablet Take 1,000 mg by mouth in the morning, at noon, and at bedtime.   albuterol  (PROVENTIL ) (2.5 MG/3ML) 0.083% nebulizer solution Take 3 mLs (2.5 mg total) by nebulization every 4 (four) hours as needed for wheezing or shortness of breath.   albuterol  (VENTOLIN  HFA) 108 (90 Base) MCG/ACT inhaler Inhale 2 puffs into the lungs every 4 (four) hours as needed for wheezing or shortness of breath.   aspirin  EC 81 MG tablet Take 81 mg by mouth daily. Swallow whole.   atenolol  (TENORMIN ) 25 MG tablet Take 1 tablet (25 mg total)  by mouth 2 (two) times daily.   atorvastatin  (LIPITOR) 20 MG tablet Take 1 tablet (20 mg total) by mouth daily.   Cholecalciferol (D 1000) 25 MCG (1000 UT) capsule Take 1,000 Units by mouth daily.   cyanocobalamin  1000 MCG tablet Take 1 tablet (1,000 mcg total) by mouth daily.   divalproex (DEPAKOTE) 125 MG DR tablet Take 250 mg by mouth 2 (two) times daily.   fenofibrate  (TRICOR ) 48 MG tablet TAKE 1 TABLET(48 MG) BY MOUTH DAILY   fluticasone  (FLONASE ) 50 MCG/ACT nasal spray Place 1 spray into both nostrils daily as needed for allergies or rhinitis.   Fluticasone -Umeclidin-Vilant (TRELEGY ELLIPTA ) 100-62.5-25 MCG/ACT AEPB Inhale 1 puff into the lungs daily.   Fluticasone -Umeclidin-Vilant (TRELEGY ELLIPTA ) 200-62.5-25 MCG/ACT AEPB Inhale 200 mcg into the lungs as needed.   gabapentin  (NEURONTIN ) 300 MG capsule Take 3 capsules (900 mg total) by mouth 2  (two) times daily.   nortriptyline (PAMELOR) 10 MG capsule Take 10 mg by mouth daily.   OXYGEN Inhale into the lungs. As needed Has not used in a year   pantoprazole  (PROTONIX ) 40 MG tablet TAKE 1 TABLET(40 MG) BY MOUTH DAILY   QUEtiapine (SEROQUEL) 25 MG tablet Take 75 mg by mouth at bedtime.   Spacer/Aero-Holding Raguel FRENCH Use as directed for inhaler use   Tiotropium Bromide-Olodaterol (STIOLTO RESPIMAT ) 2.5-2.5 MCG/ACT AERS Inhale 2 puffs into the lungs daily.   Tiotropium Bromide-Olodaterol (STIOLTO RESPIMAT ) 2.5-2.5 MCG/ACT AERS Inhale 2 puffs into the lungs daily.   tiZANidine  (ZANAFLEX ) 4 MG tablet Take 4 mg by mouth 3 (three) times daily.   venlafaxine  XR (EFFEXOR  XR) 75 MG 24 hr capsule Take 1 capsule (75 mg total) by mouth daily with breakfast.   No facility-administered medications prior to visit.    Review of Systems All negative Except see HPI   {Insert previous labs (optional):23779} {See past labs  Heme  Chem  Endocrine  Serology  Results Review (optional):1}   Objective    BP 125/82   Pulse 65   Resp 16   Ht 5' 10 (1.778 m)   Wt 194 lb 14.4 oz (88.4 kg)   SpO2 99%   BMI 27.97 kg/m  {Insert last BP/Wt (optional):23777}{See vitals history (optional):1}   Physical Exam Vitals reviewed.  Constitutional:      General: He is not in acute distress.    Appearance: Normal appearance. He is not diaphoretic.  HENT:     Head: Normocephalic and atraumatic.  Eyes:     General: No scleral icterus.    Conjunctiva/sclera: Conjunctivae normal.  Cardiovascular:     Rate and Rhythm: Normal rate and regular rhythm.     Pulses: Normal pulses.     Heart sounds: Normal heart sounds. No murmur heard. Pulmonary:     Effort: Pulmonary effort is normal. No respiratory distress.     Breath sounds: Rhonchi present. No wheezing.  Musculoskeletal:     Cervical back: Neck supple.     Right lower leg: No edema.     Left lower leg: No edema.  Lymphadenopathy:      Cervical: No cervical adenopathy.  Skin:    General: Skin is warm and dry.     Findings: No rash.  Neurological:     Mental Status: He is alert and oriented to person, place, and time. Mental status is at baseline.  Psychiatric:        Mood and Affect: Mood normal.        Behavior: Behavior normal.  No results found for any visits on 01/17/24.      Assessment & Plan Chronic obstructive pulmonary disease (COPD) with acute exacerbation COPD exacerbation with wet cough and intermittent dyspnea. Non-compliance with nebulizer and albuterol . Prednisone  previously effective but symptoms recur post-cessation. - Prescribe prednisone  for 5 days. - Provide Trelegy 200 mcg samples. - Instruct to use nasal saline rinse, gel, or spray. - Advise follow-up with Dr. Lenda for medication management.  Tobacco abuse/nicotine dependence (active smoking) Active smoking exacerbates COPD and complicates health management. - Advise smoking cessation.  Allergic rhinitis with persistent nasal drainage Persistent nasal drainage and congestion likely due to allergic rhinitis. Non-compliance with Flonase . - Recommend using Flonase . - Advise nasal saline rinse or spray.  Chronic low back pain and chronic neck pain Managed with injections. Recent injection received. Follow-up with Ortho  Risk for falls (requires cane/walker) Increased fall risk due to mobility issues. Inconsistent use of cane and walker. - Encourage use of cane or walker, especially at home. - Advise to stay active and exercise safely.  Abnormal bone marrow signal (under surveillance, no malignancy) Under surveillance with no malignancy. Scheduled follow-up in October. - Follow up with Dr. Jacobo in October for re-evaluation.   No orders of the defined types were placed in this encounter.   No follow-ups on file.   The patient was advised to call back or seek an in-person evaluation if the symptoms worsen or if the  condition fails to improve as anticipated.  I discussed the assessment and treatment plan with the patient. The patient was provided an opportunity to ask questions and all were answered. The patient agreed with the plan and demonstrated an understanding of the instructions.  I, Yandiel Bergum, PA-C have reviewed all documentation for this visit. The documentation on 01/17/2024  for the exam, diagnosis, procedures, and orders are all accurate and complete.  Jolynn Spencer, [facility], MMS [facility] [phone] (phone) [phone] (fax)  [facility]

## 2024-01-18 ENCOUNTER — Encounter: Payer: Self-pay | Admitting: Pulmonary Disease

## 2024-01-18 DIAGNOSIS — J439 Emphysema, unspecified: Secondary | ICD-10-CM

## 2024-01-18 DIAGNOSIS — J209 Acute bronchitis, unspecified: Secondary | ICD-10-CM

## 2024-01-18 MED ORDER — TRELEGY ELLIPTA 200-62.5-25 MCG/ACT IN AEPB: 1.0000 | INHALATION_SPRAY | Freq: Every day | RESPIRATORY_TRACT | 3 refills | Status: AC

## 2024-01-18 MED ORDER — BACLOFEN 10 MG PO TABS: 5.0000 mg | ORAL_TABLET | Freq: Two times a day (BID) | ORAL | 3 refills | Status: DC | PRN

## 2024-01-18 NOTE — Telephone Encounter (Signed)
 I have no problem with the Trelegy 200 the issue is is that he is Medicaid and we had to try the Dulera  and the Stiolto before anything else otherwise Medicaid will not cover it.  It does not matter whether I am the prescriber or not.  Is just the Medicaid rules.  We can send the prescription but it would likely require preauthorization.

## 2024-01-21 ENCOUNTER — Telehealth: Payer: Self-pay

## 2024-01-21 ENCOUNTER — Other Ambulatory Visit (HOSPITAL_COMMUNITY): Payer: Self-pay

## 2024-01-21 NOTE — Telephone Encounter (Signed)
 Your request has been approved PA Case: [id number], Status: Approved, Coverage Starts on: 01/21/2024 12:00:00 AM, Coverage Ends on: 01/20/2025 12:00:00 AM. Authorization Expiration09/29/2026

## 2024-01-22 NOTE — Progress Notes (Deleted)
 01/23/2024 9:11 PM   [name] September 27, 1960 [id number]  Referring provider: Dineen Channel, PA-C [address] [place],  [place] [address]  Urological history: 1.  Erectile dysfunction - Tadalafil  5 mg daily and augmenting with tadalafil  20 mg on demand dosing  2.  Peyronie's disease - Greater than 30 degree dorsal curvature per patient  3. Incontinence - night time incontinence  5. BPH with LU TS - PSA (04/2023) 0.6  No chief complaint on file.  HPI: Rocklin Soderquist is a 63 y.o. man who presents today for three month follow up.    Previous records reviewed.   SHIM ***  He does not have confidence that he could get and keep an erection, his erections are not firm enough for penetrative intercourse, he has difficulty maintaining his erections,  and he is not finding intercourse satisfactory for him.  ***  Patient still having spontaneous erections.  ***   He denies any pain or curvature with erections.    He is not able to ejaculate, has pain with ejaculation, and has blood in his ejaculate fluid.   ***  Contributing factors for his ED are Peyronie's disease, HTN, CVA, COVID-19, sleep apnea, CKD, smoking, depression, and HLD  Testosterone level ***  Cholesterol (957974) 213,triglycerides 443  Hemoglobin A1c (05/2023) 5.9  TSH (05/2023)  2.100   Tried and failed ***  I PSS ***  He reports sensation of incomplete bladder emptying, urinary frequency, urinary intermittency, urinary urgency, a weak urinary stream, having to strain to void, nocturia x ***, leaking before being able to reach the restroom, leaking with coughing, leaking without awareness, and post void dribbling.     He is wearing *** pads//depends  daily.    Patient denies any modifying or aggravating factors.  Patient denies any recent UTI's, gross hematuria, dysuria or suprapubic/flank pain.  Patient denies any fevers, chills, nausea or vomiting.  ***  He has a family  history of PCa, colon cancer, ovarian cancer and/or breast cancer with ***.   He does not have a family history of PCa, colon cancer, ovarian cancer, and/or breast cancer .***     UA (10/2023) unremarkable   PVR***  PSA (01//2025) 0.6  Serum creatinine (11/2023) 1.52,  51 eGFR  Diuretics:  ***  Fluid consumptiom: ***   PMH: Past Medical History:  Diagnosis Date   Allergy 2020   Anxiety    Arthritis    Blood transfusion without reported diagnosis 11/2022   Breast cancer (HCC)    COPD (chronic obstructive pulmonary disease) (HCC) 2023   Depression    Emphysema of lung (HCC)    Flu 07/2017   GERD (gastroesophageal reflux disease) 2023   Hyperlipidemia    Hypertension    Intractable nausea and vomiting 12/11/2022   Nodule of right lung    Oxygen deficiency 2023   Only when needed   Sleep apnea 2024   Stroke (HCC) 11/2022   Tinnitus     Surgical History: Past Surgical History:  Procedure Laterality Date   APPENDECTOMY  1975   BACK SURGERY     COLONOSCOPY N/A 11/19/2023   Procedure: COLONOSCOPY;  Surgeon: Maryruth Ole DASEN, MD;  Location: [facility] ENDOSCOPY;  Service: Endoscopy;  Laterality: N/A;   COLONOSCOPY WITH PROPOFOL  N/A 05/18/2023   Procedure: COLONOSCOPY WITH PROPOFOL ;  Surgeon: Maryruth Ole DASEN, MD;  Location: [facility] ENDOSCOPY;  Service: Endoscopy;  Laterality: N/A;   ESOPHAGOGASTRODUODENOSCOPY (EGD) WITH PROPOFOL  N/A 05/18/2023   Procedure: ESOPHAGOGASTRODUODENOSCOPY (EGD) WITH  PROPOFOL ;  Surgeon: Maryruth Ole DASEN, MD;  Location: [facility] ENDOSCOPY;  Service: Endoscopy;  Laterality: N/A;   HEMOSTASIS CLIP PLACEMENT  05/18/2023   Procedure: HEMOSTASIS CLIP PLACEMENT;  Surgeon: Maryruth Ole DASEN, MD;  Location: [facility] ENDOSCOPY;  Service: Endoscopy;;   KNEE ARTHROSCOPY WITH MEDIAL MENISECTOMY Right 08/20/2017   Procedure: KNEE ARTHROSCOPY WITH PARTIAL MEDIAL AND LATERAL MENISECTOMY,PARTIAL SYNOVECTOMY,CHONDROPLASTY, LOOSE BODY REMOVAL;  Surgeon: Leora Lynwood SAUNDERS, MD;   Location: [facility] ORS;  Service: Orthopedics;  Laterality: Right;   KNEE SURGERY  1975   1996, 2000   POLYPECTOMY  05/18/2023   Procedure: POLYPECTOMY;  Surgeon: Maryruth Ole DASEN, MD;  Location: [facility] ENDOSCOPY;  Service: Endoscopy;;   SPINE SURGERY  9 1 89   Last of multiple procedures,  limbar fusion   SUBMUCOSAL LIFTING INJECTION  05/18/2023   Procedure: SUBMUCOSAL LIFTING INJECTION;  Surgeon: Maryruth Ole DASEN, MD;  Location: [facility] ENDOSCOPY;  Service: Endoscopy;;    Home Medications:  Allergies as of 01/23/2024       Reactions   Wound Dressing Adhesive Other (See Comments)   BLISTER   Azithromycin  Rash, Dermatitis   Penicillin G Rash   Has patient had a PCN reaction causing immediate rash, facial/tongue/throat swelling, SOB or lightheadedness with hypotension: Yes Has patient had a PCN reaction causing severe rash involving mucus membranes or skin necrosis: No Has patient had a PCN reaction that required hospitalization: No Has patient had a PCN reaction occurring within the last 10 years: No If all of the above answers are NO, then may proceed with Cephalosporin use.        Medication List        Accurate as of January 22, 2024  9:11 PM. If you have any questions, ask your nurse or doctor.          acetaminophen  500 MG tablet Commonly known as: TYLENOL  Take 1,000 mg by mouth in the morning, at noon, and at bedtime.   albuterol  108 (90 Base) MCG/ACT inhaler Commonly known as: VENTOLIN  HFA Inhale 2 puffs into the lungs every 4 (four) hours as needed for wheezing or shortness of breath.   albuterol  (2.5 MG/3ML) 0.083% nebulizer solution Commonly known as: PROVENTIL  Take 3 mLs (2.5 mg total) by nebulization every 4 (four) hours as needed for wheezing or shortness of breath.   aspirin  EC 81 MG tablet Take 81 mg by mouth daily. Swallow whole.   atenolol  25 MG tablet Commonly known as: TENORMIN  Take 1 tablet (25 mg total) by mouth 2 (two) times daily.    atorvastatin  20 MG tablet Commonly known as: LIPITOR Take 1 tablet (20 mg total) by mouth daily.   baclofen  10 MG tablet Commonly known as: LIORESAL  Take 0.5-1 tablets (5-10 mg total) by mouth 2 (two) times daily as needed for muscle spasms.   cyanocobalamin  1000 MCG tablet Take 1 tablet (1,000 mcg total) by mouth daily.   D 1000 25 MCG (1000 UT) capsule Generic drug: Cholecalciferol Take 1,000 Units by mouth daily.   divalproex 125 MG DR tablet Commonly known as: DEPAKOTE Take 250 mg by mouth 2 (two) times daily.   fenofibrate  48 MG tablet Commonly known as: TRICOR  TAKE 1 TABLET(48 MG) BY MOUTH DAILY   fluticasone  50 MCG/ACT nasal spray Commonly known as: FLONASE  Place 1 spray into both nostrils daily as needed for allergies or rhinitis.   gabapentin  300 MG capsule Commonly known as: NEURONTIN  Take 3 capsules (900 mg total) by mouth 2 (two) times daily.   nortriptyline  10 MG capsule Commonly known as: PAMELOR Take 10 mg by mouth daily.   OXYGEN Inhale into the lungs. As needed Has not used in a year   pantoprazole  40 MG tablet Commonly known as: PROTONIX  TAKE 1 TABLET(40 MG) BY MOUTH DAILY   predniSONE  20 MG tablet Commonly known as: DELTASONE  Take 1 tablet (20 mg total) by mouth 2 (two) times daily with a meal.   QUEtiapine 25 MG tablet Commonly known as: SEROQUEL Take 75 mg by mouth at bedtime.   Spacer/Aero-Holding [employer] Use as directed for inhaler use   Stiolto Respimat  2.5-2.5 MCG/ACT Aers Generic drug: Tiotropium Bromide-Olodaterol Inhale 2 puffs into the lungs daily.   Stiolto Respimat  2.5-2.5 MCG/ACT Aers Generic drug: Tiotropium Bromide-Olodaterol Inhale 2 puffs into the lungs daily.   Trelegy Ellipta  200-62.5-25 MCG/ACT Aepb Generic drug: Fluticasone -Umeclidin-Vilant Inhale 1 Inhaler into the lungs as directed.   Trelegy Ellipta  200-62.5-25 MCG/ACT Aepb Generic drug: Fluticasone -Umeclidin-Vilant Inhale 1 puff into the lungs  daily.   venlafaxine  XR 75 MG 24 hr capsule Commonly known as: Effexor  XR Take 1 capsule (75 mg total) by mouth daily with breakfast.        Allergies:  Allergies  Allergen Reactions   Wound Dressing Adhesive Other (See Comments)    BLISTER   Azithromycin  Rash and Dermatitis   Penicillin G Rash    Has patient had a PCN reaction causing immediate rash, facial/tongue/throat swelling, SOB or lightheadedness with hypotension: Yes Has patient had a PCN reaction causing severe rash involving mucus membranes or skin necrosis: No Has patient had a PCN reaction that required hospitalization: No Has patient had a PCN reaction occurring within the last 10 years: No If all of the above answers are NO, then may proceed with Cephalosporin use.     Family History: Family History  Problem Relation Age of Onset   Hypertension Mother    Stroke Mother    Cancer Mother        colon   Arthritis Mother    Depression Mother    Stroke Father    Arthritis Father    Hearing loss Father    Anxiety disorder Son    Anxiety disorder Daughter    Obesity Sister    Bladder Cancer Neg Hx    Prostate cancer Neg Hx    Hematuria Neg Hx     Social History:  reports that he has been smoking cigarettes. He started smoking about 43 years ago. He has a 84.6 pack-year smoking history. He has never used smokeless tobacco. He reports that he does not currently use drugs after having used the following drugs: Marijuana. He reports that he does not drink alcohol.  ROS: Pertinent ROS in HPI  Physical Exam: There were no vitals taken for this visit.  Constitutional:  Well nourished. Alert and oriented, No acute distress. HEENT:  AT, moist mucus membranes.  Trachea midline, no masses. Cardiovascular: No clubbing, cyanosis, or edema. Respiratory: Normal respiratory effort, no increased work of breathing. GI: Abdomen is soft, non tender, non distended, no abdominal masses. Liver and spleen not palpable.  No  hernias appreciated.  Stool sample for occult testing is not indicated.   GU: No CVA tenderness.  No bladder fullness or masses.  Patient with circumcised/uncircumcised phallus. ***Foreskin easily retracted***  Urethral meatus is patent.  No penile discharge. No penile lesions or rashes. Scrotum without lesions, cysts, rashes and/or edema.  Testicles are located scrotally bilaterally. No masses are appreciated in the testicles. Left and  right epididymis are normal. Rectal: Patient with  normal sphincter tone. Anus and perineum without scarring or rashes. No rectal masses are appreciated. Prostate is approximately *** grams, *** nodules are appreciated. Seminal vesicles are normal. Skin: No rashes, bruises or suspicious lesions. Lymph: No cervical or inguinal adenopathy. Neurologic: Grossly intact, no focal deficits, moving all 4 extremities. Psychiatric: Normal mood and affect.   Laboratory Data: See EPIC and HPI I have reviewed the labs.  See HPI.      Pertinent Imaging: N/A  Assessment & Plan:    1. ED - ***  2.  Peyronie's disease - ***  No follow-ups on file.  These notes generated with voice recognition software. I apologize for typographical errors.  CLOTILDA HELON RIGGERS  [facility] [address]  [address] [place], [place] [address] [phone]

## 2024-01-23 ENCOUNTER — Ambulatory Visit: Admitting: Urology

## 2024-01-23 DIAGNOSIS — N486 Induration penis plastica: Secondary | ICD-10-CM

## 2024-01-23 DIAGNOSIS — N529 Male erectile dysfunction, unspecified: Secondary | ICD-10-CM

## 2024-01-29 DIAGNOSIS — M47812 Spondylosis without myelopathy or radiculopathy, cervical region: Secondary | ICD-10-CM | POA: Diagnosis not present

## 2024-01-29 DIAGNOSIS — M48062 Spinal stenosis, lumbar region with neurogenic claudication: Secondary | ICD-10-CM | POA: Diagnosis not present

## 2024-01-29 DIAGNOSIS — M5416 Radiculopathy, lumbar region: Secondary | ICD-10-CM | POA: Diagnosis not present

## 2024-01-30 ENCOUNTER — Ambulatory Visit: Payer: Self-pay | Admitting: Neurology

## 2024-01-30 ENCOUNTER — Ambulatory Visit: Admitting: Urology

## 2024-01-30 NOTE — Progress Notes (Deleted)
 01/31/2024 1:35 PM   Henry Owens Jul 01, 1960 [id number]  Referring provider: Dineen Channel, PA-C [address] [place],  [place] [address]  Urological history: 1.  Erectile dysfunction - Tadalafil  5 mg daily and augmenting with tadalafil  20 mg on demand dosing  2.  Peyronie's disease - Greater than 30 degree dorsal curvature per patient  3. Incontinence - night time incontinence  5. BPH with LU TS - PSA (04/2023) 0.6  No chief complaint on file.  HPI: Henry Owens is a 63 y.o. man who presents today for three month follow up.    Previous records reviewed.   SHIM ***  He does not have confidence that he could get and keep an erection, his erections are not firm enough for penetrative intercourse, he has difficulty maintaining his erections,  and he is not finding intercourse satisfactory for him.  ***  Patient still having spontaneous erections.  ***   He denies any pain or curvature with erections.    He is not able to ejaculate, has pain with ejaculation, and has blood in his ejaculate fluid.   ***  Contributing factors for his ED are Peyronie's disease, HTN, CVA, COVID-19, sleep apnea, CKD, smoking, depression, and HLD  Testosterone level ***  Cholesterol (957974) 213,triglycerides 443  Hemoglobin A1c (05/2023) 5.9  TSH (05/2023)  2.100   Tried and failed ***  I PSS ***  He reports sensation of incomplete bladder emptying, urinary frequency, urinary intermittency, urinary urgency, a weak urinary stream, having to strain to void, nocturia x ***, leaking before being able to reach the restroom, leaking with coughing, leaking without awareness, and post void dribbling.     He is wearing *** pads//depends  daily.    Patient denies any modifying or aggravating factors.  Patient denies any recent UTI's, gross hematuria, dysuria or suprapubic/flank pain.  Patient denies any fevers, chills, nausea or vomiting.  ***  He has a family  history of PCa, colon cancer, ovarian cancer and/or breast cancer with ***.   He does not have a family history of PCa, colon cancer, ovarian cancer, and/or breast cancer .***     UA (10/2023) unremarkable   PVR***  PSA (01//2025) 0.6  Serum creatinine (11/2023) 1.52,  51 eGFR  Diuretics:  ***  Fluid consumptiom: ***   PMH: Past Medical History:  Diagnosis Date   Allergy 2020   Anxiety    Arthritis    Blood transfusion without reported diagnosis 11/2022   Breast cancer (HCC)    COPD (chronic obstructive pulmonary disease) (HCC) 2023   Depression    Emphysema of lung (HCC)    Flu 07/2017   GERD (gastroesophageal reflux disease) 2023   Hyperlipidemia    Hypertension    Intractable nausea and vomiting 12/11/2022   Nodule of right lung    Oxygen deficiency 2023   Only when needed   Sleep apnea 2024   Stroke (HCC) 11/2022   Tinnitus     Surgical History: Past Surgical History:  Procedure Laterality Date   APPENDECTOMY  1975   BACK SURGERY     COLONOSCOPY N/A 11/19/2023   Procedure: COLONOSCOPY;  Surgeon: Maryruth Ole DASEN, MD;  Location: [facility] ENDOSCOPY;  Service: Endoscopy;  Laterality: N/A;   COLONOSCOPY WITH PROPOFOL  N/A 05/18/2023   Procedure: COLONOSCOPY WITH PROPOFOL ;  Surgeon: Maryruth Ole DASEN, MD;  Location: [facility] ENDOSCOPY;  Service: Endoscopy;  Laterality: N/A;   ESOPHAGOGASTRODUODENOSCOPY (EGD) WITH PROPOFOL  N/A 05/18/2023   Procedure: ESOPHAGOGASTRODUODENOSCOPY (EGD) WITH  PROPOFOL ;  Surgeon: Maryruth Ole DASEN, MD;  Location: [facility] ENDOSCOPY;  Service: Endoscopy;  Laterality: N/A;   HEMOSTASIS CLIP PLACEMENT  05/18/2023   Procedure: HEMOSTASIS CLIP PLACEMENT;  Surgeon: Maryruth Ole DASEN, MD;  Location: [facility] ENDOSCOPY;  Service: Endoscopy;;   KNEE ARTHROSCOPY WITH MEDIAL MENISECTOMY Right 08/20/2017   Procedure: KNEE ARTHROSCOPY WITH PARTIAL MEDIAL AND LATERAL MENISECTOMY,PARTIAL SYNOVECTOMY,CHONDROPLASTY, LOOSE BODY REMOVAL;  Surgeon: Leora Lynwood SAUNDERS, MD;   Location: [facility] ORS;  Service: Orthopedics;  Laterality: Right;   KNEE SURGERY  1975   1996, 2000   POLYPECTOMY  05/18/2023   Procedure: POLYPECTOMY;  Surgeon: Maryruth Ole DASEN, MD;  Location: [facility] ENDOSCOPY;  Service: Endoscopy;;   SPINE SURGERY  9 1 89   Last of multiple procedures,  limbar fusion   SUBMUCOSAL LIFTING INJECTION  05/18/2023   Procedure: SUBMUCOSAL LIFTING INJECTION;  Surgeon: Maryruth Ole DASEN, MD;  Location: [facility] ENDOSCOPY;  Service: Endoscopy;;    Home Medications:  Allergies as of 01/31/2024       Reactions   Wound Dressing Adhesive Other (See Comments)   BLISTER   Azithromycin  Rash, Dermatitis   Penicillin G Rash   Has patient had a PCN reaction causing immediate rash, facial/tongue/throat swelling, SOB or lightheadedness with hypotension: Yes Has patient had a PCN reaction causing severe rash involving mucus membranes or skin necrosis: No Has patient had a PCN reaction that required hospitalization: No Has patient had a PCN reaction occurring within the last 10 years: No If all of the above answers are NO, then may proceed with Cephalosporin use.        Medication List        Accurate as of January 30, 2024  1:35 PM. If you have any questions, ask your nurse or doctor.          acetaminophen  500 MG tablet Commonly known as: TYLENOL  Take 1,000 mg by mouth in the morning, at noon, and at bedtime.   albuterol  108 (90 Base) MCG/ACT inhaler Commonly known as: VENTOLIN  HFA Inhale 2 puffs into the lungs every 4 (four) hours as needed for wheezing or shortness of breath.   albuterol  (2.5 MG/3ML) 0.083% nebulizer solution Commonly known as: PROVENTIL  Take 3 mLs (2.5 mg total) by nebulization every 4 (four) hours as needed for wheezing or shortness of breath.   aspirin  EC 81 MG tablet Take 81 mg by mouth daily. Swallow whole.   atenolol  25 MG tablet Commonly known as: TENORMIN  Take 1 tablet (25 mg total) by mouth 2 (two) times daily.    atorvastatin  20 MG tablet Commonly known as: LIPITOR Take 1 tablet (20 mg total) by mouth daily.   baclofen  10 MG tablet Commonly known as: LIORESAL  Take 0.5-1 tablets (5-10 mg total) by mouth 2 (two) times daily as needed for muscle spasms.   cyanocobalamin  1000 MCG tablet Take 1 tablet (1,000 mcg total) by mouth daily.   D 1000 25 MCG (1000 UT) capsule Generic drug: Cholecalciferol Take 1,000 Units by mouth daily.   divalproex 125 MG DR tablet Commonly known as: DEPAKOTE Take 250 mg by mouth 2 (two) times daily.   fenofibrate  48 MG tablet Commonly known as: TRICOR  TAKE 1 TABLET(48 MG) BY MOUTH DAILY   fluticasone  50 MCG/ACT nasal spray Commonly known as: FLONASE  Place 1 spray into both nostrils daily as needed for allergies or rhinitis.   gabapentin  300 MG capsule Commonly known as: NEURONTIN  Take 3 capsules (900 mg total) by mouth 2 (two) times daily.   nortriptyline  10 MG capsule Commonly known as: PAMELOR Take 10 mg by mouth daily.   OXYGEN Inhale into the lungs. As needed Has not used in a year   pantoprazole  40 MG tablet Commonly known as: PROTONIX  TAKE 1 TABLET(40 MG) BY MOUTH DAILY   predniSONE  20 MG tablet Commonly known as: DELTASONE  Take 1 tablet (20 mg total) by mouth 2 (two) times daily with a meal.   QUEtiapine 25 MG tablet Commonly known as: SEROQUEL Take 75 mg by mouth at bedtime.   Spacer/Aero-Holding [employer] Use as directed for inhaler use   Stiolto Respimat  2.5-2.5 MCG/ACT Aers Generic drug: Tiotropium Bromide-Olodaterol Inhale 2 puffs into the lungs daily.   Stiolto Respimat  2.5-2.5 MCG/ACT Aers Generic drug: Tiotropium Bromide-Olodaterol Inhale 2 puffs into the lungs daily.   Trelegy Ellipta  200-62.5-25 MCG/ACT Aepb Generic drug: Fluticasone -Umeclidin-Vilant Inhale 1 Inhaler into the lungs as directed.   Trelegy Ellipta  200-62.5-25 MCG/ACT Aepb Generic drug: Fluticasone -Umeclidin-Vilant Inhale 1 puff into the lungs  daily.   venlafaxine  XR 75 MG 24 hr capsule Commonly known as: Effexor  XR Take 1 capsule (75 mg total) by mouth daily with breakfast.        Allergies:  Allergies  Allergen Reactions   Wound Dressing Adhesive Other (See Comments)    BLISTER   Azithromycin  Rash and Dermatitis   Penicillin G Rash    Has patient had a PCN reaction causing immediate rash, facial/tongue/throat swelling, SOB or lightheadedness with hypotension: Yes Has patient had a PCN reaction causing severe rash involving mucus membranes or skin necrosis: No Has patient had a PCN reaction that required hospitalization: No Has patient had a PCN reaction occurring within the last 10 years: No If all of the above answers are NO, then may proceed with Cephalosporin use.     Family History: Family History  Problem Relation Age of Onset   Hypertension Mother    Stroke Mother    Cancer Mother        colon   Arthritis Mother    Depression Mother    Stroke Father    Arthritis Father    Hearing loss Father    Anxiety disorder Son    Anxiety disorder Daughter    Obesity Sister    Bladder Cancer Neg Hx    Prostate cancer Neg Hx    Hematuria Neg Hx     Social History:  reports that he has been smoking cigarettes. He started smoking about 43 years ago. He has a 84.6 pack-year smoking history. He has never used smokeless tobacco. He reports that he does not currently use drugs after having used the following drugs: Marijuana. He reports that he does not drink alcohol.  ROS: Pertinent ROS in HPI  Physical Exam: There were no vitals taken for this visit.  Constitutional:  Well nourished. Alert and oriented, No acute distress. HEENT: [place] AT, moist mucus membranes.  Trachea midline, no masses. Cardiovascular: No clubbing, cyanosis, or edema. Respiratory: Normal respiratory effort, no increased work of breathing. GI: Abdomen is soft, non tender, non distended, no abdominal masses. Liver and spleen not palpable.  No  hernias appreciated.  Stool sample for occult testing is not indicated.   GU: No CVA tenderness.  No bladder fullness or masses.  Patient with circumcised/uncircumcised phallus. ***Foreskin easily retracted***  Urethral meatus is patent.  No penile discharge. No penile lesions or rashes. Scrotum without lesions, cysts, rashes and/or edema.  Testicles are located scrotally bilaterally. No masses are appreciated in the testicles. Left and  right epididymis are normal. Rectal: Patient with  normal sphincter tone. Anus and perineum without scarring or rashes. No rectal masses are appreciated. Prostate is approximately *** grams, *** nodules are appreciated. Seminal vesicles are normal. Skin: No rashes, bruises or suspicious lesions. Lymph: No cervical or inguinal adenopathy. Neurologic: Grossly intact, no focal deficits, moving all 4 extremities. Psychiatric: Normal mood and affect.   Laboratory Data: See EPIC and HPI I have reviewed the labs.  See HPI.      Pertinent Imaging: N/A  Assessment & Plan:    1. ED - ***  2.  Peyronie's disease - ***  No follow-ups on file.  These notes generated with voice recognition software. I apologize for typographical errors.  CLOTILDA HELON RIGGERS  [facility] [address]  [address] [place], [place] [address] [phone]

## 2024-01-31 ENCOUNTER — Ambulatory Visit: Admitting: Urology

## 2024-01-31 DIAGNOSIS — N529 Male erectile dysfunction, unspecified: Secondary | ICD-10-CM

## 2024-01-31 DIAGNOSIS — N486 Induration penis plastica: Secondary | ICD-10-CM

## 2024-02-05 DIAGNOSIS — M5416 Radiculopathy, lumbar region: Secondary | ICD-10-CM | POA: Diagnosis not present

## 2024-02-05 DIAGNOSIS — M48062 Spinal stenosis, lumbar region with neurogenic claudication: Secondary | ICD-10-CM | POA: Diagnosis not present

## 2024-02-05 NOTE — Progress Notes (Unsigned)
 02/06/2024 9:17 PM   Henry Owens 1961-03-11 [id number]  Referring provider: Dineen Channel, PA-C [address] [place],  [place] [address]  Urological history: 1.  Erectile dysfunction - Tadalafil  5 mg daily and augmenting with tadalafil  20 mg on demand dosing  2.  Peyronie's disease - Greater than 30 degree dorsal curvature per patient  3. Incontinence - night time incontinence  5. BPH with LU TS - PSA (04/2023) 0.6  No chief complaint on file.  HPI: Henry Owens is a 63 y.o. man who presents today for three month follow up.    Previous records reviewed.   SHIM ***  He does not have confidence that he could get and keep an erection, his erections are not firm enough for penetrative intercourse, he has difficulty maintaining his erections,  and he is not finding intercourse satisfactory for him.  ***  Patient still having spontaneous erections.  ***   He denies any pain or curvature with erections.    He is not able to ejaculate, has pain with ejaculation, and has blood in his ejaculate fluid.   ***  Contributing factors for his ED are Peyronie's disease, HTN, CVA, COVID-19, sleep apnea, CKD, smoking, depression, and HLD  Testosterone level ***  Cholesterol (957974) 213,triglycerides 443  Hemoglobin A1c (05/2023) 5.9  TSH (05/2023)  2.100   Tried and failed ***  I PSS ***  He reports sensation of incomplete bladder emptying, urinary frequency, urinary intermittency, urinary urgency, a weak urinary stream, having to strain to void, nocturia x ***, leaking before being able to reach the restroom, leaking with coughing, leaking without awareness, and post void dribbling.     He is wearing *** pads//depends  daily.    Patient denies any modifying or aggravating factors.  Patient denies any recent UTI's, gross hematuria, dysuria or suprapubic/flank pain.  Patient denies any fevers, chills, nausea or vomiting.  ***  He has a family  history of PCa, colon cancer, ovarian cancer and/or breast cancer with ***.   He does not have a family history of PCa, colon cancer, ovarian cancer, and/or breast cancer .***     UA (10/2023) unremarkable   PVR***  PSA (01//2025) 0.6  Serum creatinine (11/2023) 1.52,  51 eGFR  Diuretics:  ***  Fluid consumptiom: ***   PMH: Past Medical History:  Diagnosis Date   Allergy 2020   Anxiety    Arthritis    Blood transfusion without reported diagnosis 11/2022   Breast cancer (HCC)    COPD (chronic obstructive pulmonary disease) (HCC) 2023   Depression    Emphysema of lung (HCC)    Flu 07/2017   GERD (gastroesophageal reflux disease) 2023   Hyperlipidemia    Hypertension    Intractable nausea and vomiting 12/11/2022   Nodule of right lung    Oxygen deficiency 2023   Only when needed   Sleep apnea 2024   Stroke (HCC) 11/2022   Tinnitus     Surgical History: Past Surgical History:  Procedure Laterality Date   APPENDECTOMY  1975   BACK SURGERY     COLONOSCOPY N/A 11/19/2023   Procedure: COLONOSCOPY;  Surgeon: Maryruth Ole DASEN, MD;  Location: [facility] ENDOSCOPY;  Service: Endoscopy;  Laterality: N/A;   COLONOSCOPY WITH PROPOFOL  N/A 05/18/2023   Procedure: COLONOSCOPY WITH PROPOFOL ;  Surgeon: Maryruth Ole DASEN, MD;  Location: [facility] ENDOSCOPY;  Service: Endoscopy;  Laterality: N/A;   ESOPHAGOGASTRODUODENOSCOPY (EGD) WITH PROPOFOL  N/A 05/18/2023   Procedure: ESOPHAGOGASTRODUODENOSCOPY (EGD) WITH  PROPOFOL ;  Surgeon: Maryruth Ole DASEN, MD;  Location: [facility] ENDOSCOPY;  Service: Endoscopy;  Laterality: N/A;   HEMOSTASIS CLIP PLACEMENT  05/18/2023   Procedure: HEMOSTASIS CLIP PLACEMENT;  Surgeon: Maryruth Ole DASEN, MD;  Location: [facility] ENDOSCOPY;  Service: Endoscopy;;   KNEE ARTHROSCOPY WITH MEDIAL MENISECTOMY Right 08/20/2017   Procedure: KNEE ARTHROSCOPY WITH PARTIAL MEDIAL AND LATERAL MENISECTOMY,PARTIAL SYNOVECTOMY,CHONDROPLASTY, LOOSE BODY REMOVAL;  Surgeon: Leora Lynwood SAUNDERS, MD;   Location: [facility] ORS;  Service: Orthopedics;  Laterality: Right;   KNEE SURGERY  1975   1996, 2000   POLYPECTOMY  05/18/2023   Procedure: POLYPECTOMY;  Surgeon: Maryruth Ole DASEN, MD;  Location: [facility] ENDOSCOPY;  Service: Endoscopy;;   SPINE SURGERY  9 1 89   Last of multiple procedures,  limbar fusion   SUBMUCOSAL LIFTING INJECTION  05/18/2023   Procedure: SUBMUCOSAL LIFTING INJECTION;  Surgeon: Maryruth Ole DASEN, MD;  Location: [facility] ENDOSCOPY;  Service: Endoscopy;;    Home Medications:  Allergies as of 02/06/2024       Reactions   Wound Dressing Adhesive Other (See Comments)   BLISTER   Azithromycin  Rash, Dermatitis   Penicillin G Rash   Has patient had a PCN reaction causing immediate rash, facial/tongue/throat swelling, SOB or lightheadedness with hypotension: Yes Has patient had a PCN reaction causing severe rash involving mucus membranes or skin necrosis: No Has patient had a PCN reaction that required hospitalization: No Has patient had a PCN reaction occurring within the last 10 years: No If all of the above answers are NO, then may proceed with Cephalosporin use.        Medication List        Accurate as of February 05, 2024  9:17 PM. If you have any questions, ask your nurse or doctor.          acetaminophen  500 MG tablet Commonly known as: TYLENOL  Take 1,000 mg by mouth in the morning, at noon, and at bedtime.   albuterol  108 (90 Base) MCG/ACT inhaler Commonly known as: VENTOLIN  HFA Inhale 2 puffs into the lungs every 4 (four) hours as needed for wheezing or shortness of breath.   albuterol  (2.5 MG/3ML) 0.083% nebulizer solution Commonly known as: PROVENTIL  Take 3 mLs (2.5 mg total) by nebulization every 4 (four) hours as needed for wheezing or shortness of breath.   aspirin  EC 81 MG tablet Take 81 mg by mouth daily. Swallow whole.   atenolol  25 MG tablet Commonly known as: TENORMIN  Take 1 tablet (25 mg total) by mouth 2 (two) times daily.    atorvastatin  20 MG tablet Commonly known as: LIPITOR Take 1 tablet (20 mg total) by mouth daily.   baclofen  10 MG tablet Commonly known as: LIORESAL  Take 0.5-1 tablets (5-10 mg total) by mouth 2 (two) times daily as needed for muscle spasms.   cyanocobalamin  1000 MCG tablet Take 1 tablet (1,000 mcg total) by mouth daily.   D 1000 25 MCG (1000 UT) capsule Generic drug: Cholecalciferol Take 1,000 Units by mouth daily.   divalproex 125 MG DR tablet Commonly known as: DEPAKOTE Take 250 mg by mouth 2 (two) times daily.   fenofibrate  48 MG tablet Commonly known as: TRICOR  TAKE 1 TABLET(48 MG) BY MOUTH DAILY   fluticasone  50 MCG/ACT nasal spray Commonly known as: FLONASE  Place 1 spray into both nostrils daily as needed for allergies or rhinitis.   gabapentin  300 MG capsule Commonly known as: NEURONTIN  Take 3 capsules (900 mg total) by mouth 2 (two) times daily.   nortriptyline  10 MG capsule Commonly known as: PAMELOR Take 10 mg by mouth daily.   OXYGEN Inhale into the lungs. As needed Has not used in a year   pantoprazole  40 MG tablet Commonly known as: PROTONIX  TAKE 1 TABLET(40 MG) BY MOUTH DAILY   predniSONE  20 MG tablet Commonly known as: DELTASONE  Take 1 tablet (20 mg total) by mouth 2 (two) times daily with a meal.   QUEtiapine 25 MG tablet Commonly known as: SEROQUEL Take 75 mg by mouth at bedtime.   Spacer/Aero-Holding [employer] Use as directed for inhaler use   Stiolto Respimat  2.5-2.5 MCG/ACT Aers Generic drug: Tiotropium Bromide-Olodaterol Inhale 2 puffs into the lungs daily.   Stiolto Respimat  2.5-2.5 MCG/ACT Aers Generic drug: Tiotropium Bromide-Olodaterol Inhale 2 puffs into the lungs daily.   Trelegy Ellipta  200-62.5-25 MCG/ACT Aepb Generic drug: Fluticasone -Umeclidin-Vilant Inhale 1 Inhaler into the lungs as directed.   Trelegy Ellipta  200-62.5-25 MCG/ACT Aepb Generic drug: Fluticasone -Umeclidin-Vilant Inhale 1 puff into the lungs  daily.   venlafaxine  XR 75 MG 24 hr capsule Commonly known as: Effexor  XR Take 1 capsule (75 mg total) by mouth daily with breakfast.        Allergies:  Allergies  Allergen Reactions   Wound Dressing Adhesive Other (See Comments)    BLISTER   Azithromycin  Rash and Dermatitis   Penicillin G Rash    Has patient had a PCN reaction causing immediate rash, facial/tongue/throat swelling, SOB or lightheadedness with hypotension: Yes Has patient had a PCN reaction causing severe rash involving mucus membranes or skin necrosis: No Has patient had a PCN reaction that required hospitalization: No Has patient had a PCN reaction occurring within the last 10 years: No If all of the above answers are NO, then may proceed with Cephalosporin use.     Family History: Family History  Problem Relation Age of Onset   Hypertension Mother    Stroke Mother    Cancer Mother        colon   Arthritis Mother    Depression Mother    Stroke Father    Arthritis Father    Hearing loss Father    Anxiety disorder Son    Anxiety disorder Daughter    Obesity Sister    Bladder Cancer Neg Hx    Prostate cancer Neg Hx    Hematuria Neg Hx     Social History:  reports that he has been smoking cigarettes. He started smoking about 43 years ago. He has a 84.6 pack-year smoking history. He has never used smokeless tobacco. He reports that he does not currently use drugs after having used the following drugs: Marijuana. He reports that he does not drink alcohol.  ROS: Pertinent ROS in HPI  Physical Exam: There were no vitals taken for this visit.  Constitutional:  Well nourished. Alert and oriented, No acute distress. HEENT: [place] AT, moist mucus membranes.  Trachea midline, no masses. Cardiovascular: No clubbing, cyanosis, or edema. Respiratory: Normal respiratory effort, no increased work of breathing. GI: Abdomen is soft, non tender, non distended, no abdominal masses. Liver and spleen not palpable.  No  hernias appreciated.  Stool sample for occult testing is not indicated.   GU: No CVA tenderness.  No bladder fullness or masses.  Patient with circumcised/uncircumcised phallus. ***Foreskin easily retracted***  Urethral meatus is patent.  No penile discharge. No penile lesions or rashes. Scrotum without lesions, cysts, rashes and/or edema.  Testicles are located scrotally bilaterally. No masses are appreciated in the testicles. Left and  right epididymis are normal. Rectal: Patient with  normal sphincter tone. Anus and perineum without scarring or rashes. No rectal masses are appreciated. Prostate is approximately *** grams, *** nodules are appreciated. Seminal vesicles are normal. Skin: No rashes, bruises or suspicious lesions. Lymph: No cervical or inguinal adenopathy. Neurologic: Grossly intact, no focal deficits, moving all 4 extremities. Psychiatric: Normal mood and affect.   Laboratory Data: See EPIC and HPI I have reviewed the labs.  See HPI.      Pertinent Imaging: N/A  Assessment & Plan:    1. ED - ***  2.  Peyronie's disease - ***  No follow-ups on file.  These notes generated with voice recognition software. I apologize for typographical errors.  CLOTILDA HELON RIGGERS  [facility] [address]  [address] [place], [place] [address] [phone]

## 2024-02-06 ENCOUNTER — Encounter: Payer: Self-pay | Admitting: Urology

## 2024-02-06 ENCOUNTER — Inpatient Hospital Stay: Payer: Medicaid Other | Attending: Oncology

## 2024-02-06 ENCOUNTER — Ambulatory Visit: Admitting: Urology

## 2024-02-06 ENCOUNTER — Other Ambulatory Visit: Payer: Self-pay | Admitting: Physician Assistant

## 2024-02-06 VITALS — BP 158/80 | HR 66 | Ht 70.0 in | Wt 195.0 lb

## 2024-02-06 DIAGNOSIS — J441 Chronic obstructive pulmonary disease with (acute) exacerbation: Secondary | ICD-10-CM

## 2024-02-06 DIAGNOSIS — R779 Abnormality of plasma protein, unspecified: Secondary | ICD-10-CM

## 2024-02-06 DIAGNOSIS — R7689 Other specified abnormal immunological findings in serum: Secondary | ICD-10-CM | POA: Diagnosis not present

## 2024-02-06 DIAGNOSIS — N529 Male erectile dysfunction, unspecified: Secondary | ICD-10-CM

## 2024-02-06 LAB — COMPREHENSIVE METABOLIC PANEL WITH GFR
ALT: 16 U/L (ref 0–44)
AST: 15 U/L (ref 15–41)
Albumin: 4.1 g/dL (ref 3.5–5.0)
Alkaline Phosphatase: 105 U/L (ref 38–126)
Anion gap: 8 (ref 5–15)
BUN: 14 mg/dL (ref 8–23)
CO2: 25 mmol/L (ref 22–32)
Calcium: 9.4 mg/dL (ref 8.9–10.3)
Chloride: 102 mmol/L (ref 98–111)
Creatinine, Ser: 1.39 mg/dL — ABNORMAL HIGH (ref 0.61–1.24)
GFR, Estimated: 57 mL/min — ABNORMAL LOW (ref >60)
Glucose, Bld: 124 mg/dL — ABNORMAL HIGH (ref 70–99)
Potassium: 4.4 mmol/L (ref 3.5–5.1)
Sodium: 135 mmol/L (ref 135–145)
Total Bilirubin: 0.5 mg/dL (ref 0.0–1.2)
Total Protein: 7.1 g/dL (ref 6.5–8.1)

## 2024-02-06 LAB — CBC WITH DIFFERENTIAL/PLATELET
Abs Immature Granulocytes: 0.11 K/uL — ABNORMAL HIGH (ref 0.00–0.07)
Basophils Absolute: 0 K/uL (ref 0.0–0.1)
Basophils Relative: 0 %
Eosinophils Absolute: 0 K/uL (ref 0.0–0.5)
Eosinophils Relative: 0 %
HCT: 42 % (ref 39.0–52.0)
Hemoglobin: 13.9 g/dL (ref 13.0–17.0)
Immature Granulocytes: 1 %
Lymphocytes Relative: 8 %
Lymphs Abs: 1.1 K/uL (ref 0.7–4.0)
MCH: 28.9 pg (ref 26.0–34.0)
MCHC: 33.1 g/dL (ref 30.0–36.0)
MCV: 87.3 fL (ref 80.0–100.0)
Monocytes Absolute: 0.6 K/uL (ref 0.1–1.0)
Monocytes Relative: 4 %
Neutro Abs: 10.9 K/uL — ABNORMAL HIGH (ref 1.7–7.7)
Neutrophils Relative %: 87 %
Platelets: 185 K/uL (ref 150–400)
RBC: 4.81 MIL/uL (ref 4.22–5.81)
RDW: 14.2 % (ref 11.5–15.5)
WBC: 12.7 K/uL — ABNORMAL HIGH (ref 4.0–10.5)
nRBC: 0 % (ref 0.0–0.2)

## 2024-02-06 MED ORDER — AMBULATORY NON FORMULARY MEDICATION: 0 refills | Status: DC

## 2024-02-07 ENCOUNTER — Other Ambulatory Visit: Payer: Self-pay | Admitting: Physician Assistant

## 2024-02-07 ENCOUNTER — Ambulatory Visit: Payer: Self-pay | Admitting: Urology

## 2024-02-07 DIAGNOSIS — J441 Chronic obstructive pulmonary disease with (acute) exacerbation: Secondary | ICD-10-CM

## 2024-02-07 LAB — TESTOSTERONE: Testosterone: 218 ng/dL — ABNORMAL LOW (ref 264–916)

## 2024-02-07 LAB — KAPPA/LAMBDA LIGHT CHAINS
Kappa free light chain: 94.9 mg/L — ABNORMAL HIGH (ref 3.3–19.4)
Kappa, lambda light chain ratio: 8.71 — ABNORMAL HIGH (ref 0.26–1.65)
Lambda free light chains: 10.9 mg/L (ref 5.7–26.3)

## 2024-02-07 NOTE — Telephone Encounter (Unsigned)
 Copied from CRM [id number]. Topic: Clinical - Medication Refill >> Feb 07, 2024  1:40 PM Darshell M wrote: Medication: fenofibrate  (TRICOR ) 48 MG tablet  Has the patient contacted their pharmacy? Yes (Agent: If no, request that the patient contact the pharmacy for the refill. If patient does not wish to contact the pharmacy document the reason why and proceed with request.) (Agent: If yes, when and what did the pharmacy advise?)  This is the patient's preferred pharmacy:  [id number] [place], [place] - [address] AT [facility] OF [address] [place] [address] Phone: [phone] Fax: [phone]  Is this the correct pharmacy for this prescription? Yes If no, delete pharmacy and type the correct one.   Has the prescription been filled recently? No  Is the patient out of the medication? Yes  Has the patient been seen for an appointment in the last year OR does the patient have an upcoming appointment? Yes  Can we respond through MyChart? Yes  Agent: Please be advised that Rx refills may take up to 3 business days. We ask that you follow-up with your pharmacy.

## 2024-02-12 MED ORDER — FENOFIBRATE 48 MG PO TABS: 48.0000 mg | ORAL_TABLET | Freq: Every day | ORAL | 1 refills | Status: AC

## 2024-02-13 ENCOUNTER — Ambulatory Visit: Payer: Medicaid Other | Admitting: Oncology

## 2024-02-15 ENCOUNTER — Encounter: Payer: Self-pay | Admitting: Physician Assistant

## 2024-02-15 ENCOUNTER — Encounter: Payer: Self-pay | Admitting: Oncology

## 2024-02-15 ENCOUNTER — Inpatient Hospital Stay: Admitting: Oncology

## 2024-02-15 DIAGNOSIS — R779 Abnormality of plasma protein, unspecified: Secondary | ICD-10-CM | POA: Diagnosis not present

## 2024-02-15 DIAGNOSIS — R898 Other abnormal findings in specimens from other organs, systems and tissues: Secondary | ICD-10-CM

## 2024-02-15 NOTE — Progress Notes (Signed)
 [facility]  [phone] [phone]  ID: Henry Owens OB: 11/27/1960  MR#: [id number]  RDW#:[id number]  Patient Care Team: Ostwalt, Janna, PA-C as PCP - General (Physician Assistant) Jacobo Evalene PARAS, MD as Consulting Physician (Oncology)  I connected with Henry Owens on 02/15/24 at  9:45 AM EDT by video enabled telemedicine visit and verified that I am speaking with the correct person using two identifiers.   I discussed the limitations, risks, security and privacy concerns of performing an evaluation and management service by telemedicine and the availability of in-person appointments. I also discussed with the patient that there may be a patient responsible charge related to this service. The patient expressed understanding and agreed to proceed.   Other persons participating in the visit and their role in the encounter: Patient, MD.  Patient's location: Home. Provider's location: Clinic.  CHIEF COMPLAINT: Elevated kappa free light chains.  INTERVAL HISTORY: Patient agreed to video-assisted telemedicine visit for routine yearly evaluation and discussion of his laboratory results.  Over the past year he developed seizures which are now being treated and he is followed closely by neurology.  Patient reports these are now under control.  He otherwise feels well and is asymptomatic. He has no new neurologic complaints.  He denies any recent fevers.  He has a good appetite and denies weight loss.  He has no chest pain, shortness of breath, cough, or hemoptysis.  He denies any nausea, vomiting, constipation, or diarrhea.  He has no urinary complaints.  Patient offers no further specific complaints today.  REVIEW OF SYSTEMS:   Review of Systems  Constitutional: Negative.  Negative for diaphoresis, fever, malaise/fatigue and weight loss.  Respiratory: Negative.  Negative for cough, hemoptysis and shortness of breath.   Cardiovascular:  Negative.  Negative for chest pain and leg swelling.  Gastrointestinal: Negative.  Negative for abdominal pain.  Genitourinary: Negative.  Negative for dysuria.  Musculoskeletal: Negative.  Negative for back pain and falls.  Skin: Negative.  Negative for rash.  Neurological:  Positive for seizures. Negative for dizziness, focal weakness, weakness and headaches.  Psychiatric/Behavioral: Negative.  The patient is not nervous/anxious.     As per HPI. Otherwise, a complete review of systems is negative.  PAST MEDICAL HISTORY: Past Medical History:  Diagnosis Date   Allergy 2020   Anxiety    Arthritis    Blood transfusion without reported diagnosis 11/2022   Breast cancer (HCC)    COPD (chronic obstructive pulmonary disease) (HCC) 2023   Depression    Emphysema of lung (HCC)    Flu 07/2017   GERD (gastroesophageal reflux disease) 2023   Hyperlipidemia    Hypertension    Intractable nausea and vomiting 12/11/2022   Nodule of right lung    Oxygen deficiency 2023   Only when needed   Sleep apnea 2024   Stroke (HCC) 11/2022   Tinnitus     PAST SURGICAL HISTORY: Past Surgical History:  Procedure Laterality Date   APPENDECTOMY  1975   BACK SURGERY     COLONOSCOPY N/A 11/19/2023   Procedure: COLONOSCOPY;  Surgeon: Maryruth Ole DASEN, MD;  Location: [facility] ENDOSCOPY;  Service: Endoscopy;  Laterality: N/A;   COLONOSCOPY WITH PROPOFOL  N/A 05/18/2023   Procedure: COLONOSCOPY WITH PROPOFOL ;  Surgeon: Maryruth Ole DASEN, MD;  Location: [facility] ENDOSCOPY;  Service: Endoscopy;  Laterality: N/A;   ESOPHAGOGASTRODUODENOSCOPY (EGD) WITH PROPOFOL  N/A 05/18/2023   Procedure: ESOPHAGOGASTRODUODENOSCOPY (EGD) WITH PROPOFOL ;  Surgeon: Maryruth Ole DASEN, MD;  Location: [facility]  ENDOSCOPY;  Service: Endoscopy;  Laterality: N/A;   HEMOSTASIS CLIP PLACEMENT  05/18/2023   Procedure: HEMOSTASIS CLIP PLACEMENT;  Surgeon: Maryruth Ole DASEN, MD;  Location: [facility] ENDOSCOPY;  Service: Endoscopy;;   KNEE  ARTHROSCOPY WITH MEDIAL MENISECTOMY Right 08/20/2017   Procedure: KNEE ARTHROSCOPY WITH PARTIAL MEDIAL AND LATERAL MENISECTOMY,PARTIAL SYNOVECTOMY,CHONDROPLASTY, LOOSE BODY REMOVAL;  Surgeon: Leora Lynwood SAUNDERS, MD;  Location: [facility] ORS;  Service: Orthopedics;  Laterality: Right;   KNEE SURGERY  1975   1996, 2000   POLYPECTOMY  05/18/2023   Procedure: POLYPECTOMY;  Surgeon: Maryruth Ole DASEN, MD;  Location: [facility] ENDOSCOPY;  Service: Endoscopy;;   SPINE SURGERY  9 1 89   Last of multiple procedures,  limbar fusion   SUBMUCOSAL LIFTING INJECTION  05/18/2023   Procedure: SUBMUCOSAL LIFTING INJECTION;  Surgeon: Maryruth Ole DASEN, MD;  Location: [facility] ENDOSCOPY;  Service: Endoscopy;;    FAMILY HISTORY: Family History  Problem Relation Age of Onset   Hypertension Mother    Stroke Mother    Cancer Mother        colon   Arthritis Mother    Depression Mother    Stroke Father    Arthritis Father    Hearing loss Father    Anxiety disorder Son    Anxiety disorder Daughter    Obesity Sister    Bladder Cancer Neg Hx    Prostate cancer Neg Hx    Hematuria Neg Hx     ADVANCED DIRECTIVES (Y/N):  N  HEALTH MAINTENANCE: Social History   Tobacco Use   Smoking status: Every Day    Current packs/day: 1.50    Average packs/day: 2.0 packs/day for 42.7 years (84.7 ttl pk-yrs)    Types: Cigarettes    Start date: 1982    Last attempt to quit: 06/22/2021   Smokeless tobacco: Never  Vaping Use   Vaping status: Never Used  Substance Use Topics   Alcohol use: No   Drug use: Not Currently    Types: Marijuana     Colonoscopy:  PAP:  Bone density:  Lipid panel:  Allergies  Allergen Reactions   Wound Dressing Adhesive Other (See Comments)    BLISTER   Azithromycin  Rash and Dermatitis   Penicillin G Rash    Has patient had a PCN reaction causing immediate rash, facial/tongue/throat swelling, SOB or lightheadedness with hypotension: Yes Has patient had a PCN reaction causing severe rash  involving mucus membranes or skin necrosis: No Has patient had a PCN reaction that required hospitalization: No Has patient had a PCN reaction occurring within the last 10 years: No If all of the above answers are NO, then may proceed with Cephalosporin use.     Current Outpatient Medications  Medication Sig Dispense Refill   acetaminophen  (TYLENOL ) 500 MG tablet Take 1,000 mg by mouth in the morning, at noon, and at bedtime.     albuterol  (PROVENTIL ) (2.5 MG/3ML) 0.083% nebulizer solution Take 3 mLs (2.5 mg total) by nebulization every 4 (four) hours as needed for wheezing or shortness of breath. 75 mL 2   albuterol  (VENTOLIN  HFA) 108 (90 Base) MCG/ACT inhaler Inhale 2 puffs into the lungs every 4 (four) hours as needed for wheezing or shortness of breath. 8 g 2   AMBULATORY NON FORMULARY MEDICATION Trimix (30/1/10)-(Pap/Phent/PGE)  Test Dose  1ml vial   Qty #3 Refills 0  [facility] [phone] Fax [phone] 3 vial 0   aspirin  EC 81 MG tablet Take 81 mg by mouth daily. Swallow whole.  atenolol  (TENORMIN ) 25 MG tablet Take 1 tablet (25 mg total) by mouth 2 (two) times daily. 180 tablet 1   atorvastatin  (LIPITOR) 20 MG tablet Take 1 tablet (20 mg total) by mouth daily. 90 tablet 3   Cholecalciferol (D 1000) 25 MCG (1000 UT) capsule Take 1,000 Units by mouth daily.     cyanocobalamin  1000 MCG tablet Take 1 tablet (1,000 mcg total) by mouth daily. 30 tablet 0   divalproex (DEPAKOTE) 125 MG DR tablet Take 250 mg by mouth 2 (two) times daily.     fenofibrate  (TRICOR ) 48 MG tablet Take 1 tablet (48 mg total) by mouth daily. 90 tablet 1   fluticasone  (FLONASE ) 50 MCG/ACT nasal spray Place 1 spray into both nostrils daily as needed for allergies or rhinitis.     Fluticasone -Umeclidin-Vilant (TRELEGY ELLIPTA ) 200-62.5-25 MCG/ACT AEPB Inhale 1 Inhaler into the lungs as directed.     Fluticasone -Umeclidin-Vilant (TRELEGY ELLIPTA ) 200-62.5-25 MCG/ACT AEPB Inhale 1 puff into the  lungs daily. 60 each 3   gabapentin  (NEURONTIN ) 300 MG capsule Take 3 capsules (900 mg total) by mouth 2 (two) times daily.     nortriptyline (PAMELOR) 10 MG capsule Take 10 mg by mouth daily.     OXYGEN Inhale into the lungs. As needed Has not used in a year     pantoprazole  (PROTONIX ) 40 MG tablet TAKE 1 TABLET(40 MG) BY MOUTH DAILY 90 tablet 1   predniSONE  (DELTASONE ) 20 MG tablet Take 1 tablet (20 mg total) by mouth 2 (two) times daily with a meal. 10 tablet 0   QUEtiapine (SEROQUEL) 25 MG tablet Take 75 mg by mouth at bedtime.     Spacer/Aero-Holding Raguel FRENCH Use as directed for inhaler use 1 each 0   Tiotropium Bromide-Olodaterol (STIOLTO RESPIMAT ) 2.5-2.5 MCG/ACT AERS Inhale 2 puffs into the lungs daily. 4 g 11   Tiotropium Bromide-Olodaterol (STIOLTO RESPIMAT ) 2.5-2.5 MCG/ACT AERS Inhale 2 puffs into the lungs daily.     venlafaxine  XR (EFFEXOR  XR) 75 MG 24 hr capsule Take 1 capsule (75 mg total) by mouth daily with breakfast. 90 capsule 1   No current facility-administered medications for this visit.    OBJECTIVE: There were no vitals filed for this visit.    There is no height or weight on file to calculate BMI.    ECOG FS:0 - Asymptomatic  General: Well-developed, well-nourished, no acute distress. HEENT: Normocephalic. Neuro: Alert, answering all questions appropriately. Cranial nerves grossly intact. Psych: Normal affect.  LAB RESULTS:  Lab Results  Component Value Date   NA 135 02/06/2024   K 4.4 02/06/2024   CL 102 02/06/2024   CO2 25 02/06/2024   GLUCOSE 124 (H) 02/06/2024   BUN 14 02/06/2024   CREATININE 1.39 (H) 02/06/2024   CALCIUM  9.4 02/06/2024   PROT 7.1 02/06/2024   ALBUMIN 4.1 02/06/2024   AST 15 02/06/2024   ALT 16 02/06/2024   ALKPHOS 105 02/06/2024   BILITOT 0.5 02/06/2024   GFRNONAA 57 (L) 02/06/2024    Lab Results  Component Value Date   WBC 12.7 (H) 02/06/2024   NEUTROABS 10.9 (H) 02/06/2024   HGB 13.9 02/06/2024   HCT 42.0  02/06/2024   MCV 87.3 02/06/2024   PLT 185 02/06/2024     STUDIES: No results found.  ASSESSMENT: Elevated kappa free light chains.  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  Patient's kappa free light chains have ranged between 94.9 and 111.3 since September 2024.  His most  recent result is 94.9.  SPEP and immunoglobulins are within normal limits.  He has no evidence of endorgan damage.  No intervention is needed at this time.  Patient does not require bone marrow biopsy.  Continue to monitor on a yearly basis for a total of 5 years through September 2029.  Return to clinic in 1 year with laboratory work and video-assisted telemedicine visit with APP.   Abnormal bone marrow signal: Clinically insignificant.  Patient has had a full workup x 2 that was unrevealing.  Nuclear med bone scan completed on February 05, 2023 reviewed independently and reported as above with no evidence of skeletal metastasis.  No intervention is needed at this time. Elevated alkaline phosphatase: Resolved.   Renal insufficiency: Patient's creatinine is 1.39 today which appears to be his baseline. Seizures: Continue monitoring and treatment per neurology.     I provided 20 minutes of face-to-face video visit time during this encounter which included chart review, counseling, and coordination of care as documented above.    Patient expressed understanding and was in agreement with this plan. He also understands that He can call clinic at any time with any questions, concerns, or complaints.    Evalene JINNY Reusing, MD   02/15/2024 10:15 AM

## 2024-02-18 ENCOUNTER — Telehealth: Payer: Self-pay | Admitting: Diagnostic Neuroimaging

## 2024-02-18 NOTE — Telephone Encounter (Signed)
 Pt called to inquire if Dr Margaret was the one that managed his tiZANidine  (ZANAFLEX ) 4 MG tablet .  The my chart message from 12/26/23 was relayed to him.

## 2024-02-27 ENCOUNTER — Other Ambulatory Visit: Payer: Self-pay | Admitting: Physician Assistant

## 2024-02-27 ENCOUNTER — Ambulatory Visit: Attending: Sleep Medicine

## 2024-02-27 NOTE — Telephone Encounter (Unsigned)
 Copied from CRM [id number]. Topic: Clinical - Medication Refill >> Feb 27, 2024 11:28 AM Delon T wrote: Medication: albuterol  (PROVENTIL ) (2.5 MG/3ML) 0.083% nebulizer solution  Has the patient contacted their pharmacy? No (Agent: If no, request that the patient contact the pharmacy for the refill. If patient does not wish to contact the pharmacy document the reason why and proceed with request.) (Agent: If yes, when and what did the pharmacy advise?)  This is the patient's preferred pharmacy:  [id number] [place], [place] - [address] AT [facility] OF [address] [place] [address] Phone: [phone] Fax: [phone]  Is this the correct pharmacy for this prescription? Yes If no, delete pharmacy and type the correct one.   Has the prescription been filled recently? Yes  Is the patient out of the medication? Yes  Has the patient been seen for an appointment in the last year OR does the patient have an upcoming appointment? Yes  Can we respond through MyChart? Yes  Agent: Please be advised that Rx refills may take up to 3 business days. We ask that you follow-up with your pharmacy.

## 2024-02-28 ENCOUNTER — Other Ambulatory Visit: Payer: Self-pay

## 2024-02-28 ENCOUNTER — Encounter: Payer: Self-pay | Admitting: Diagnostic Neuroimaging

## 2024-02-28 ENCOUNTER — Emergency Department

## 2024-02-28 DIAGNOSIS — J439 Emphysema, unspecified: Secondary | ICD-10-CM | POA: Diagnosis not present

## 2024-02-28 DIAGNOSIS — Z5321 Procedure and treatment not carried out due to patient leaving prior to being seen by health care provider: Secondary | ICD-10-CM | POA: Diagnosis not present

## 2024-02-28 DIAGNOSIS — F17200 Nicotine dependence, unspecified, uncomplicated: Secondary | ICD-10-CM | POA: Diagnosis not present

## 2024-02-28 DIAGNOSIS — J189 Pneumonia, unspecified organism: Secondary | ICD-10-CM | POA: Diagnosis not present

## 2024-02-28 DIAGNOSIS — R059 Cough, unspecified: Secondary | ICD-10-CM | POA: Diagnosis not present

## 2024-02-28 DIAGNOSIS — R0602 Shortness of breath: Secondary | ICD-10-CM | POA: Insufficient documentation

## 2024-02-28 DIAGNOSIS — R062 Wheezing: Secondary | ICD-10-CM | POA: Diagnosis not present

## 2024-02-28 DIAGNOSIS — J441 Chronic obstructive pulmonary disease with (acute) exacerbation: Secondary | ICD-10-CM | POA: Diagnosis not present

## 2024-02-28 DIAGNOSIS — I10 Essential (primary) hypertension: Secondary | ICD-10-CM | POA: Diagnosis not present

## 2024-02-28 LAB — CBC
HCT: 41.5 % (ref 39.0–52.0)
Hemoglobin: 13.4 g/dL (ref 13.0–17.0)
MCH: 28.8 pg (ref 26.0–34.0)
MCHC: 32.3 g/dL (ref 30.0–36.0)
MCV: 89.1 fL (ref 80.0–100.0)
Platelets: 155 K/uL (ref 150–400)
RBC: 4.66 MIL/uL (ref 4.22–5.81)
RDW: 14.9 % (ref 11.5–15.5)
WBC: 7.6 K/uL (ref 4.0–10.5)
nRBC: 0 % (ref 0.0–0.2)

## 2024-02-28 LAB — BASIC METABOLIC PANEL WITH GFR
Anion gap: 11 (ref 5–15)
BUN: 16 mg/dL (ref 8–23)
CO2: 25 mmol/L (ref 22–32)
Calcium: 9.1 mg/dL (ref 8.9–10.3)
Chloride: 100 mmol/L (ref 98–111)
Creatinine, Ser: 1.42 mg/dL — ABNORMAL HIGH (ref 0.61–1.24)
GFR, Estimated: 56 mL/min — ABNORMAL LOW (ref >60)
Glucose, Bld: 113 mg/dL — ABNORMAL HIGH (ref 70–99)
Potassium: 4 mmol/L (ref 3.5–5.1)
Sodium: 136 mmol/L (ref 135–145)

## 2024-02-28 LAB — BRAIN NATRIURETIC PEPTIDE: B Natriuretic Peptide: 57.9 pg/mL (ref 0.0–100.0)

## 2024-02-28 LAB — TROPONIN I (HIGH SENSITIVITY): Troponin I (High Sensitivity): 6 ng/L (ref <18)

## 2024-02-28 NOTE — Telephone Encounter (Signed)
 Requested medication (s) are due for refill today: yes  Requested medication (s) are on the active medication list: yes  Last refill:    Future visit scheduled: yes  Notes to clinic:  Unable to refill per protocol, last refill by another provider.      Requested Prescriptions  Pending Prescriptions Disp Refills   albuterol  (PROVENTIL ) (2.5 MG/3ML) 0.083% nebulizer solution 75 mL 2    Sig: Take 3 mLs (2.5 mg total) by nebulization every 4 (four) hours as needed for wheezing or shortness of breath.     Pulmonology:  Beta Agonists 2 Failed - 02/28/2024  4:13 PM      Failed - Last BP in normal range    BP Readings from Last 1 Encounters:  02/06/24 (!) 158/80         Passed - Last Heart Rate in normal range    Pulse Readings from Last 1 Encounters:  02/06/24 66         Passed - Valid encounter within last 12 months    Recent Outpatient Visits           1 month ago COPD with acute exacerbation (HCC)   [place] [facility] [place], [place], PA-C   1 month ago Non-seasonal allergic rhinitis due to other allergic trigger   [facility] [place], [place], PA-C   2 months ago Essential hypertension   [place] [facility] [place], [place], PA-C   3 months ago Essential hypertension   [facility] [place], [place], PA-C   4 months ago Mixed hyperlipidemia   [place] [facility] [place], [place], PA-C       Future Appointments             In 1 week McGowan, Clotilda DELENA RIGGERS [facility] [place]   In 3 months Penumalli, Eduard SAUNDERS, MD [facility]

## 2024-02-28 NOTE — ED Triage Notes (Addendum)
 Patient brought in via [facility] from home with complaints of shortness of breath. Patient was 89% on room air and reports productive cough x3 days. EMS states patient had wheezing initially, which improved after 2.5mg  albuterol . Placed on 3L [place] with improvement of O2 sats. Patient states he has hx of emphysema, supposed to wear 2L [place] as needed but had not been wearing it today.

## 2024-02-29 ENCOUNTER — Emergency Department: Admission: EM | Admit: 2024-02-29 | Discharge: 2024-02-29 | Discharge status: Against Medical Advice

## 2024-02-29 ENCOUNTER — Ambulatory Visit: Payer: Self-pay

## 2024-02-29 ENCOUNTER — Emergency Department: Admission: EM | Admit: 2024-02-29 | Discharge: 2024-02-29 | Disposition: A | Discharge status: Home / Self Care

## 2024-02-29 ENCOUNTER — Ambulatory Visit: Payer: Self-pay | Admitting: Pulmonary Disease

## 2024-02-29 DIAGNOSIS — R062 Wheezing: Secondary | ICD-10-CM | POA: Diagnosis not present

## 2024-02-29 DIAGNOSIS — R0602 Shortness of breath: Secondary | ICD-10-CM | POA: Diagnosis not present

## 2024-02-29 DIAGNOSIS — R051 Acute cough: Secondary | ICD-10-CM | POA: Diagnosis not present

## 2024-02-29 DIAGNOSIS — I10 Essential (primary) hypertension: Secondary | ICD-10-CM | POA: Insufficient documentation

## 2024-02-29 DIAGNOSIS — R0603 Acute respiratory distress: Secondary | ICD-10-CM | POA: Diagnosis not present

## 2024-02-29 DIAGNOSIS — J441 Chronic obstructive pulmonary disease with (acute) exacerbation: Secondary | ICD-10-CM | POA: Insufficient documentation

## 2024-02-29 DIAGNOSIS — R0689 Other abnormalities of breathing: Secondary | ICD-10-CM | POA: Diagnosis not present

## 2024-02-29 DIAGNOSIS — R0981 Nasal congestion: Secondary | ICD-10-CM | POA: Diagnosis not present

## 2024-02-29 DIAGNOSIS — J189 Pneumonia, unspecified organism: Secondary | ICD-10-CM | POA: Insufficient documentation

## 2024-02-29 DIAGNOSIS — F17200 Nicotine dependence, unspecified, uncomplicated: Secondary | ICD-10-CM | POA: Insufficient documentation

## 2024-02-29 LAB — RESP PANEL BY RT-PCR (RSV, FLU A&B, COVID)  RVPGX2
Influenza A by PCR: NEGATIVE
Influenza B by PCR: NEGATIVE
Resp Syncytial Virus by PCR: NEGATIVE
SARS Coronavirus 2 by RT PCR: NEGATIVE

## 2024-02-29 LAB — CBC WITH DIFFERENTIAL/PLATELET
Abs Immature Granulocytes: 0.04 K/uL (ref 0.00–0.07)
Basophils Absolute: 0 K/uL (ref 0.0–0.1)
Basophils Relative: 0 %
Eosinophils Absolute: 0 K/uL (ref 0.0–0.5)
Eosinophils Relative: 0 %
HCT: 38.8 % — ABNORMAL LOW (ref 39.0–52.0)
Hemoglobin: 12.6 g/dL — ABNORMAL LOW (ref 13.0–17.0)
Immature Granulocytes: 1 %
Lymphocytes Relative: 15 %
Lymphs Abs: 1.1 K/uL (ref 0.7–4.0)
MCH: 29 pg (ref 26.0–34.0)
MCHC: 32.5 g/dL (ref 30.0–36.0)
MCV: 89.4 fL (ref 80.0–100.0)
Monocytes Absolute: 0.8 K/uL (ref 0.1–1.0)
Monocytes Relative: 11 %
Neutro Abs: 5.5 K/uL (ref 1.7–7.7)
Neutrophils Relative %: 73 %
Platelets: 151 K/uL (ref 150–400)
RBC: 4.34 MIL/uL (ref 4.22–5.81)
RDW: 14.9 % (ref 11.5–15.5)
WBC: 7.5 K/uL (ref 4.0–10.5)
nRBC: 0 % (ref 0.0–0.2)

## 2024-02-29 LAB — COMPREHENSIVE METABOLIC PANEL WITH GFR
ALT: 19 U/L (ref 0–44)
AST: 16 U/L (ref 15–41)
Albumin: 3.6 g/dL (ref 3.5–5.0)
Alkaline Phosphatase: 95 U/L (ref 38–126)
Anion gap: 11 (ref 5–15)
BUN: 19 mg/dL (ref 8–23)
CO2: 26 mmol/L (ref 22–32)
Calcium: 9 mg/dL (ref 8.9–10.3)
Chloride: 98 mmol/L (ref 98–111)
Creatinine, Ser: 1.64 mg/dL — ABNORMAL HIGH (ref 0.61–1.24)
GFR, Estimated: 47 mL/min — ABNORMAL LOW (ref >60)
Glucose, Bld: 96 mg/dL (ref 70–99)
Potassium: 4.3 mmol/L (ref 3.5–5.1)
Sodium: 135 mmol/L (ref 135–145)
Total Bilirubin: 0.9 mg/dL (ref 0.0–1.2)
Total Protein: 7.7 g/dL (ref 6.5–8.1)

## 2024-02-29 LAB — BLOOD GAS, VENOUS
Acid-Base Excess: 3 mmol/L — ABNORMAL HIGH (ref 0.0–2.0)
Bicarbonate: 28.5 mmol/L — ABNORMAL HIGH (ref 20.0–28.0)
O2 Saturation: 84.2 %
Patient temperature: 37
pCO2, Ven: 46 mmHg (ref 44–60)
pH, Ven: 7.4 (ref 7.25–7.43)
pO2, Ven: 50 mmHg — ABNORMAL HIGH (ref 32–45)

## 2024-02-29 LAB — TROPONIN I (HIGH SENSITIVITY): Troponin I (High Sensitivity): 6 ng/L (ref <18)

## 2024-02-29 LAB — PROCALCITONIN: Procalcitonin: 0.78 ng/mL

## 2024-02-29 MED ORDER — DOXYCYCLINE HYCLATE 100 MG PO TABS
100.0000 mg | ORAL_TABLET | Freq: Once | ORAL | Status: AC
  Administered 2024-02-29: 100 mg via ORAL
  Filled 2024-02-29: qty 1

## 2024-02-29 MED ORDER — IPRATROPIUM-ALBUTEROL 0.5-2.5 (3) MG/3ML IN SOLN
3.0000 mL | Freq: Once | RESPIRATORY_TRACT | Status: AC
  Administered 2024-02-29: 3 mL via RESPIRATORY_TRACT
  Filled 2024-02-29: qty 3

## 2024-02-29 MED ORDER — PREDNISONE 20 MG PO TABS: 20.0000 mg | ORAL_TABLET | Freq: Two times a day (BID) | ORAL | 0 refills | Status: AC

## 2024-02-29 MED ORDER — DOXYCYCLINE HYCLATE 100 MG PO TABS: 100.0000 mg | ORAL_TABLET | Freq: Two times a day (BID) | ORAL | 0 refills | Status: AC

## 2024-02-29 MED ORDER — METHYLPREDNISOLONE SODIUM SUCC 125 MG IJ SOLR
125.0000 mg | Freq: Once | INTRAMUSCULAR | Status: AC
  Administered 2024-02-29: 125 mg via INTRAVENOUS
  Filled 2024-02-29: qty 2

## 2024-02-29 MED ORDER — CEFDINIR 300 MG PO CAPS: 300.0000 mg | ORAL_CAPSULE | Freq: Two times a day (BID) | ORAL | 0 refills | Status: AC

## 2024-02-29 NOTE — ED Notes (Signed)
 Patient stated he could not stay and wait any longer due to wait time. This RN encouraged patient to go to another ER or physician at his earliest convenience due to increased oxygen requirement with stated understanding. Patient exited ER with spouse without distress noted.

## 2024-02-29 NOTE — Telephone Encounter (Signed)
 FYI Only or Action Required?: Action required by provider: clinical question for provider and update on patient condition.  Patient is followed in Pulmonology for COPD, last seen on 01/08/2024 by Jess Devona BIRCH, MD.  Called Nurse Triage reporting Shortness of Breath.  Symptoms began a couple of days ago.  Interventions attempted: Other: patient currently in the ED.  Symptoms are: unchanged.  Triage Disposition: Go to ED Now (Notify PCP)  Patient/caregiver understands and will follow disposition?: Yes      Copied from CRM #[id number]. Topic: Clinical - Red Word Triage >> Feb 29, 2024 12:19 PM Chantha C wrote: Red Word that prompted transfer to Nurse Triage: Patient's child Emmalene [phone] states patient is at [facility] for possible pneumonia and sending patient home now. Emmalene states does not feel confident on patient being sent home, patient is having a hard time breath, incoherent, has blue tint to him, blood pressure off, oxygen level is going down to 80, patient has oxygen at home. Emmalene states her mom expressed to the doctor on having patient admitted, but patient will be discharged. Emmalene would like consult.       Reason for Disposition  Oxygen level (e.g., pulse oximetry) 90% or lower  Answer Assessment - Initial Assessment Questions Patient's daughter states that the patient is in the ED and is being discharged but she believes that the patient is not well enough to be sent home. She is going to the hospital to try and advocate for him to stay in the hospital but wanted to know what else they could do for him. Please advise.        1. RESPIRATORY STATUS: Describe your breathing? (e.g., wheezing, shortness of breath, unable to speak, severe coughing)      Shortness of breath  2. ONSET: When did this breathing problem begin?      A couple of days  3. PATTERN Does the difficult breathing come and go, or has it been constant since it started?       Constant  4. SEVERITY: How bad is your breathing? (e.g., mild, moderate, severe)      Moderate to severe  5. RECURRENT SYMPTOM: Have you had difficulty breathing before? If Yes, ask: When was the last time? and What happened that time?      Yes, history of COPD 6. CARDIAC HISTORY: Do you have any history of heart disease? (e.g., heart attack, angina, bypass surgery, angioplasty)      Hypertension  7. LUNG HISTORY: Do you have any history of lung disease?  (e.g., pulmonary embolus, asthma, emphysema)     COPD 8. CAUSE: What do you think is causing the breathing problem?      COPD exacerbation  10. O2 SATURATION MONITOR:  Do you use an oxygen saturation monitor (pulse oximeter) at home? If Yes, ask: What is your reading (oxygen level) today? What is your usual oxygen saturation reading? (e.g., 95%)       Staying in the 80's  Protocols used: Breathing Difficulty-A-AH

## 2024-02-29 NOTE — Discharge Instructions (Addendum)
 You were seen in the emergency department for several days of shortness of breath.  X-ray obtained yesterday demonstrated a possible atypical pneumonia.  Please take your full course of antibiotics.  For the next 48 hours use a nebulizing treatment every 4 hours while awake.  Use your oxygen.  Your next dose of steroids will be due tomorrow morning.  Your next dose of antibiotics will be due this evening.  Return with any acutely worsening symptoms or any other emergency -- RETURN PRECAUTIONS & AFTERCARE: (ENGLISH) RETURN PRECAUTIONS: Return immediately to the emergency department or see/call your doctor if you feel worse, weak or have changes in speech or vision, are short of breath, have fever, vomiting, pain, bleeding or dark stool, trouble urinating or any new issues. Return here or see/call your doctor if not improving as expected for your suspected condition. FOLLOW-UP CARE: Call your doctor and/or any doctors we referred you to for more advice and to make an appointment. Do this today, tomorrow or after the weekend. Some doctors only take PPO insurance so if you have HMO insurance you may want to contact your HMO or your regular doctor for referral to a specialist within your plan. Either way tell the doctor's office that it was a referral from the emergency department so you get the soonest possible appointment.  YOUR TEST RESULTS: Take result reports of any blood or urine tests, imaging tests and EKG's to your doctor and any referral doctor. Have any abnormal tests repeated. Your doctor or a referral doctor can let you know when this should be done. Also make sure your doctor contacts this hospital to get any test results that are not currently available such as cultures or special tests for infection and final imaging reports, which are often not available at the time you leave the ER but which may list additional important findings that are not documented on the preliminary report. BLOOD PRESSURE:  If your blood pressure was greater than 120/80 have your blood pressure rechecked within 1 to 2 weeks. MEDICATION SIDE EFFECTS: Do not drive, walk, bike, take the bus, etc. if you have received or are being prescribed any sedating medications such as those for pain or anxiety or certain antihistamines like Benadryl . If you have been give one of these here get a taxi home or have a friend drive you home. Ask your pharmacist to counsel you on potential side effects of any new medication

## 2024-02-29 NOTE — ED Triage Notes (Signed)
 Patient coming by EMS from [facility] complaining of shortness of breath. Pt was here for same last night and left AMA due to wait time. Expiratory wheezing noted by EMS in the left upper lung and rhonchi noted in bilateral lower lobes. Pt wears 2L as needed at home. Given 1 albuteral treatment and 125 solumedrol en route.   89% Room Air  95% on 2L [place] BP: 109/82 RR: 21 CO2:32 HR: 75

## 2024-02-29 NOTE — ED Notes (Signed)
 Ambulatory sat of 93-95 without O2 and dyspnea, but desatted to 88% once back into bed. MD notified.

## 2024-02-29 NOTE — ED Provider Notes (Signed)
 [facility] Provider Note    Event Date/Time   First MD Initiated Contact with Patient 02/29/24 1025     (approximate)   History   Shortness of Breath   HPI  Henry Owens is a 63 y.o. male with COPD with 2 L as needed oxygen at home, ongoing smoking, hypertension, hyperlipidemia, arthritis who presents with 3 days of progressively worsening shortness of breath and cough.  He denies any chest pain abdominal pain or pain in any of his extremities.  He presented to the emergency department yesterday evening and received an x-ray and blood work but left before a physician could evaluate him as the wait was too long.  He returns by EMS today.  He states that he has not given himself a breathing treatment prior to today.  He has multiple breathing treatments available at home and also canisters of oxygen.  His primary care physician has already set him up for a sleep study      Physical Exam   Triage Vital Signs: ED Triage Vitals  Encounter Vitals Group     BP      Girls Systolic BP Percentile      Girls Diastolic BP Percentile      Boys Systolic BP Percentile      Boys Diastolic BP Percentile      Pulse      Resp      Temp      Temp src      SpO2      Weight      Height      Head Circumference      Peak Flow      Pain Score      Pain Loc      Pain Education      Exclude from Growth Chart     Most recent vital signs: Vitals:   02/29/24 1300 02/29/24 1330  BP: (!) 142/61 137/85  Pulse: 72 72  Resp:  20  Temp:  98 F (36.7 C)  SpO2: 96% 96%    Nursing Triage Note reviewed. Vital signs reviewed and patients oxygen saturation is normoxic, on room air but does desat with any exertion  General: Patient is well nourished, well developed, awake and alert, resting comfortably in no acute distress Head: Normocephalic and atraumatic Eyes: Normal inspection, extraocular muscles intact, no conjunctival pallor Ear, nose, throat: Normal  external exam Neck: Normal range of motion Respiratory: Patient is in mild respiratory distress, lungs wheezes throughout Cardiovascular: Patient is not tachycardic, RR GI: Abd SNT with no guarding or rebound  Back: Normal inspection of the back with good strength and range of motion throughout all ext Extremities: pulses intact with good cap refills, no LE pitting edema or calf tenderness Neuro: The patient is alert and oriented to person, place, and time, appropriately conversive, with 5/5 bilat UE/LE strength, no gross motor or sensory defects noted. Coordination appears to be adequate. Skin: Warm, dry, and intact Psych: normal mood and affect, no SI or HI  ED Results / Procedures / Treatments   Labs (all labs ordered are listed, but only abnormal results are displayed) Labs Reviewed  CBC WITH DIFFERENTIAL/PLATELET - Abnormal; Notable for the following components:      Result Value   Hemoglobin 12.6 (*)    HCT 38.8 (*)    All other components within normal limits  COMPREHENSIVE METABOLIC PANEL WITH GFR - Abnormal; Notable for the following components:   Creatinine, Ser  1.64 (*)    GFR, Estimated 47 (*)    All other components within normal limits  BLOOD GAS, VENOUS - Abnormal; Notable for the following components:   pO2, Ven 50 (*)    Bicarbonate 28.5 (*)    Acid-Base Excess 3.0 (*)    All other components within normal limits  PROCALCITONIN  URINALYSIS, COMPLETE (UACMP) WITH MICROSCOPIC     EKG EKG and rhythm strip are interpreted by myself:   EKG: [Normal sinus rhythm] at heart rate of 70, normal QRS duration, QTc 412, normal  ST segments and T waves no ectopy EKG not consistent with Acute STEMI Rhythm strip: NSR in lead II   RADIOLOGY Chest x-ray reviewed from last night demonstrating bilateral interstitial prominence suggesting mild edema or atypical infection    PROCEDURES:  Critical Care performed: No  Procedures   MEDICATIONS ORDERED IN  ED: Medications  ipratropium-albuterol  (DUONEB) 0.5-2.5 (3) MG/3ML nebulizer solution 3 mL (3 mLs Nebulization Given 02/29/24 1039)  ipratropium-albuterol  (DUONEB) 0.5-2.5 (3) MG/3ML nebulizer solution 3 mL (3 mLs Nebulization Given 02/29/24 1039)  methylPREDNISolone  sodium succinate (SOLU-MEDROL ) 125 mg/2 mL injection 125 mg (125 mg Intravenous Given 02/29/24 1040)  doxycycline  (VIBRA -TABS) tablet 100 mg (100 mg Oral Given 02/29/24 1039)     IMPRESSION / MDM / ASSESSMENT AND PLAN / ED COURSE                                Differential diagnosis includes, but is not limited to, COPD exacerbation atypical pneumonia, CHF, anemia, electrolyte derangement   ED course: Patient presents with bronchospasm on pulmonary exam.  EKG demonstrated no acute arrhythmia or ischemia.  He did have a chest x-ray performed less than 6 hours previously and I reviewed this which was consistent with possible atypical pneumonia.  He did have a BNP that was completed last night which was not significantly elevated.  Given this I did start him on doxycycline  and cefdinir  as he is allergic to azithromycin .  Patient received 2 DuoNebs with improvement in his symptoms.  He was able to tolerate ambulation without oxygen however does intermittently desat with some exertion afterwards.  I am reassured that he already has oxygen at home and he was advised that he should wear this during the day acute.  And give himself nebulizing treatments at least every 4 hours while awake for the next 48 hours.  He was counseled to stop smoking and continue to follow-up with his primary care physician regarding his sleep study.  He and his family will return with any acutely worsening symptoms.  All questions answered and patient requested discharge   Clinical Course as of 02/29/24 1614  Fri Feb 29, 2024  1120 WBC: 7.5 No leukocytosis [HD]  1133 Blood gas, venous(!) No acidosis [HD]  1134 Comprehensive metabolic panel(!) No profound  electrolyte derangements [HD]  1210 Patient's wife at bedside.  They have enough nebulizing treatments.  Patient feels much improved.  Will send the patient with scripts for antibiotics for 7 days for presumed infection.  Patient counseled to wear his home oxygen is much as possible and follow-up with the sleep study and return with any acutely worsening symptoms [HD]    Clinical Course User Index [HD] Nicholaus Rolland BRAVO, MD   At time of discharge there is no evidence of acute life, limb, vision, or fertility threat. Patient has stable vital signs, pain is well controlled, patient is ambulatory and  p.o. tolerant.  Discharge instructions were completed using the EPIC system. I would refer you to those at this time. All warnings prescriptions follow-up etc. were discussed in detail with the patient. Patient indicates understanding and is agreeable with this plan. All questions answered.  Patient is made aware that they may return to the emergency department for any worsening or new condition or for any other emergency.   -- Risk: 5 This patient has a high risk of morbidity due to further diagnostic testing or treatment. Rationale: This patient's evaluation and management involve a high risk of morbidity due to the potential severity of presenting symptoms, need for diagnostic testing, and/or initiation of treatment that may require close monitoring. The differential includes conditions with potential for significant deterioration or requiring escalation of care. Treatment decisions in the ED, including medication administration, procedural interventions, or disposition planning, reflect this level of risk. COPA: 5 The patient has the following acute or chronic illness/injury that poses a possible threat to life or bodily function: [X] : The patient has a potentially serious acute condition or an acute exacerbation of a chronic illness requiring urgent evaluation and management in the Emergency  Department. The clinical presentation necessitates immediate consideration of life-threatening or function-threatening diagnoses, even if they are ultimately ruled out.   FINAL CLINICAL IMPRESSION(S) / ED DIAGNOSES   Final diagnoses:  COPD exacerbation (HCC)  Atypical pneumonia     Rx / DC Orders   ED Discharge Orders          Ordered    predniSONE  (DELTASONE ) 20 MG tablet  2 times daily with meals        02/29/24 1310    doxycycline  (VIBRA -TABS) 100 MG tablet  2 times daily        02/29/24 1310    cefdinir  (OMNICEF ) 300 MG capsule  2 times daily        02/29/24 1310             Note:  This document was prepared using Dragon voice recognition software and may include unintentional dictation errors.   Nicholaus Rolland BRAVO, MD 02/29/24 [phone]

## 2024-02-29 NOTE — Telephone Encounter (Signed)
 Noted. Pt will see Dr Tamea on 03-14-24.

## 2024-03-02 NOTE — Progress Notes (Signed)
 " Established patient visit  Patient: Henry Owens   DOB: 1960/07/03   63 y.o. Male  MRN: [id number] Visit Date: 03/03/2024  Today's healthcare provider: Jolynn Spencer, PA-C   Chief Complaint  Patient presents with   Hospitalization Follow-up    ER f/u 02/29/24 for COPD sent home with Prednisone , Doxycycline  and Cefdinir     Subjective      Discussed the use of AI scribe software for clinical note transcription with the patient, who gave verbal consent to proceed.  History of Present Illness Henry Owens is a 63 year old male with COPD who presents for follow-up after an urgent care visit for exacerbation of symptoms.  He experienced increased shortness of breath, leading to two emergency department visits by ambulance. He is currently using Trelegy, which was previously approved for him. He has nasal congestion and white patches in his mouth, likely due to inhaler and nebulizer use.  He has hypertension, currently managed with atenolol  25 mg twice daily, but his blood pressure remains elevated over 140/90 mmHg. He previously took lisinopril  but does not recall the reason for discontinuation.  He quit smoking last week when he became ill. He uses a cane for mobility, which was obtained by his daughter.       12/21/2023    1:32 PM 11/12/2023    1:25 PM 06/29/2023    1:09 PM  Depression screen PHQ 2/9  Decreased Interest 2 2 2   Down, Depressed, Hopeless 2 2 2   PHQ - 2 Score 4 4 4   Altered sleeping 2 1 3   Tired, decreased energy 2 1 3   Change in appetite 2 0 0  Feeling bad or failure about yourself  2 2 2   Trouble concentrating 2 0 2  Moving slowly or fidgety/restless 1 0 0  Suicidal thoughts 0 0 0  PHQ-9 Score 15  8  14    Difficult doing work/chores Very difficult Somewhat difficult Very difficult     Data saved with a previous flowsheet row definition      12/21/2023    1:33 PM 11/12/2023    1:26 PM 06/29/2023    1:09 PM 06/01/2023    1:55 PM  GAD 7 :  Generalized Anxiety Score  Nervous, Anxious, on Edge 2 2 3 3   Control/stop worrying 2 2 3 3   Worry too much - different things 2 2 3 3   Trouble relaxing 2 2 3 3   Restless 2 1 0 0  Easily annoyed or irritable 2 1 0 0  Afraid - awful might happen 2 1 0 3  Total GAD 7 Score 14 11 12 15   Anxiety Difficulty Very difficult Not difficult at all Very difficult Very difficult    Medications: Outpatient Medications Prior to Visit  Medication Sig   acetaminophen  (TYLENOL ) 500 MG tablet Take 1,000 mg by mouth in the morning, at noon, and at bedtime.   albuterol  (PROVENTIL ) (2.5 MG/3ML) 0.083% nebulizer solution Take 3 mLs (2.5 mg total) by nebulization every 4 (four) hours as needed for wheezing or shortness of breath.   albuterol  (VENTOLIN  HFA) 108 (90 Base) MCG/ACT inhaler Inhale 2 puffs into the lungs every 4 (four) hours as needed for wheezing or shortness of breath.   AMBULATORY NON FORMULARY MEDICATION Trimix (30/1/10)-(Pap/Phent/PGE)  Test Dose  1ml vial   Qty #3 Refills 0  [facility] [phone] Fax [phone]   aspirin  EC 81 MG tablet Take 81 mg by mouth daily. Swallow whole.   atenolol  (TENORMIN )  25 MG tablet Take 1 tablet (25 mg total) by mouth 2 (two) times daily.   atorvastatin  (LIPITOR) 20 MG tablet Take 1 tablet (20 mg total) by mouth daily.   cefdinir  (OMNICEF ) 300 MG capsule Take 1 capsule (300 mg total) by mouth 2 (two) times daily for 7 days.   Cholecalciferol (D 1000) 25 MCG (1000 UT) capsule Take 1,000 Units by mouth daily.   cyanocobalamin  1000 MCG tablet Take 1 tablet (1,000 mcg total) by mouth daily.   divalproex  (DEPAKOTE ) 125 MG DR tablet Take 250 mg by mouth 2 (two) times daily.   doxycycline  (VIBRA -TABS) 100 MG tablet Take 1 tablet (100 mg total) by mouth 2 (two) times daily for 14 days.   fenofibrate  (TRICOR ) 48 MG tablet Take 1 tablet (48 mg total) by mouth daily.   fluticasone  (FLONASE ) 50 MCG/ACT nasal spray Place 1 spray into both nostrils daily  as needed for allergies or rhinitis.   Fluticasone -Umeclidin-Vilant (TRELEGY ELLIPTA ) 200-62.5-25 MCG/ACT AEPB Inhale 1 Inhaler into the lungs as directed.   Fluticasone -Umeclidin-Vilant (TRELEGY ELLIPTA ) 200-62.5-25 MCG/ACT AEPB Inhale 1 puff into the lungs daily.   gabapentin  (NEURONTIN ) 300 MG capsule Take 3 capsules (900 mg total) by mouth 2 (two) times daily.   nortriptyline (PAMELOR) 10 MG capsule Take 10 mg by mouth daily.   OXYGEN Inhale into the lungs. As needed Has not used in a year   pantoprazole  (PROTONIX ) 40 MG tablet TAKE 1 TABLET(40 MG) BY MOUTH DAILY   predniSONE  (DELTASONE ) 20 MG tablet Take 1 tablet (20 mg total) by mouth 2 (two) times daily with a meal for 4 days.   QUEtiapine (SEROQUEL) 25 MG tablet Take 75 mg by mouth at bedtime.   Spacer/Aero-Holding Raguel FRENCH Use as directed for inhaler use   venlafaxine  XR (EFFEXOR  XR) 75 MG 24 hr capsule Take 1 capsule (75 mg total) by mouth daily with breakfast.   [DISCONTINUED] Tiotropium Bromide-Olodaterol (STIOLTO RESPIMAT ) 2.5-2.5 MCG/ACT AERS Inhale 2 puffs into the lungs daily.   [DISCONTINUED] Tiotropium Bromide-Olodaterol (STIOLTO RESPIMAT ) 2.5-2.5 MCG/ACT AERS Inhale 2 puffs into the lungs daily.   No facility-administered medications prior to visit.    Review of Systems  All other systems reviewed and are negative.  All negative Except see HPI       Objective    BP (!) 157/92   Pulse 62   Resp 14   Ht 5' 10 (1.778 m)   Wt 194 lb 3.2 oz (88.1 kg)   SpO2 96%   BMI 27.86 kg/m     Physical Exam Vitals reviewed.  Constitutional:      General: He is not in acute distress.    Appearance: Normal appearance. He is not diaphoretic.  HENT:     Head: Normocephalic and atraumatic.  Eyes:     General: No scleral icterus.    Conjunctiva/sclera: Conjunctivae normal.  Cardiovascular:     Rate and Rhythm: Normal rate and regular rhythm.     Pulses: Normal pulses.     Heart sounds: Normal heart sounds. No  murmur heard. Pulmonary:     Effort: Pulmonary effort is normal. No respiratory distress.     Breath sounds: Normal breath sounds. No wheezing or rhonchi.  Musculoskeletal:     Cervical back: Neck supple.     Right lower leg: No edema.     Left lower leg: No edema.  Lymphadenopathy:     Cervical: No cervical adenopathy.  Skin:    General: Skin is warm and dry.  Findings: No rash.  Neurological:     Mental Status: He is alert and oriented to person, place, and time. Mental status is at baseline.  Psychiatric:        Mood and Affect: Mood normal.        Behavior: Behavior normal.      No results found for any visits on 03/03/24.      Assessment & Plan Chronic obstructive pulmonary disease with acute exacerbation vs atypical pneumonia COPD with recent exacerbation and pneumonia improved with current treatment. Trelegy essential despite cost. Smoking cessation beneficial. Pulmonology follow-up necessary for medication and x-ray. - Continue Trelegy as prescribed and albuterol  treatment - Follow-up with pulmonologist on November 18th for medication management and potential x-ray. - Continue smoking cessation efforts. - Discuss nebulizer refill with pulmonologist. Will follow-up  Oral candidiasis (thrush) Oral thrush likely due to steroid use from inhalers and nebulizers. - Use magic mouthwash as prescribed. - Rinse mouth with water  after inhalers and nebulizers.  Essential hypertension Chronic and unstable Blood pressure elevated, possibly due to medications and recent illness. Atenolol  prescribed. Consider valsartan  due to persistent hypertension. - Measure blood pressure regularly. - Prescribed valsartan  40 mg daily. - Advised on lifestyle modifications: drink water , avoid salty/spicy foods, reduce stress. Will follow-up  Tobacco use, in remission Successfully quit smoking, beneficial for COPD and health. - Continue smoking cessation efforts. Will  follow-up  Non-seasonal allergic rhinitis due to other allergic trigger - Avoidance measures discussed. - Use nasal saline rinses before nose sprays such as with Neilmed Sinus Rinse bottle.  Use distilled water .   - Use Flonase  2 sprays each nostril daily. Aim upward and outward. - Use Zyrtec 10 mg daily.   Other sleep apnea Continue with CPAP Follow-up with Dr. Jess for an adjustment  Essential hypertension  - valsartan  (DIOVAN ) 40 MG tablet; Take 1 tablet (40 mg total) by mouth daily.  Dispense: 90 tablet; Refill: 3  No orders of the defined types were placed in this encounter.   Return in about 4 weeks (around 03/31/2024) for BP f/u.   The patient was advised to call back or seek an in-person evaluation if the symptoms worsen or if the condition fails to improve as anticipated.  I discussed the assessment and treatment plan with the patient. The patient was provided an opportunity to ask questions and all were answered. The patient agreed with the plan and demonstrated an understanding of the instructions.  I, Verniece Encarnacion, PA-C have reviewed all documentation for this visit. The documentation on 03/03/2024  for the exam, diagnosis, procedures, and orders are all accurate and complete.  Jolynn Spencer, [facility], MMS [facility] [phone] (phone) [phone] (fax)  [facility] "

## 2024-03-03 ENCOUNTER — Other Ambulatory Visit: Payer: Self-pay | Admitting: Physician Assistant

## 2024-03-03 ENCOUNTER — Other Ambulatory Visit: Payer: Self-pay | Admitting: Diagnostic Neuroimaging

## 2024-03-03 ENCOUNTER — Encounter: Payer: Self-pay | Admitting: Physician Assistant

## 2024-03-03 ENCOUNTER — Ambulatory Visit: Admitting: Physician Assistant

## 2024-03-03 ENCOUNTER — Encounter: Payer: Self-pay | Admitting: Pulmonary Disease

## 2024-03-03 VITALS — BP 157/92 | HR 62 | Resp 14 | Ht 70.0 in | Wt 194.2 lb

## 2024-03-03 DIAGNOSIS — J441 Chronic obstructive pulmonary disease with (acute) exacerbation: Secondary | ICD-10-CM

## 2024-03-03 DIAGNOSIS — I10 Essential (primary) hypertension: Secondary | ICD-10-CM | POA: Diagnosis not present

## 2024-03-03 DIAGNOSIS — J189 Pneumonia, unspecified organism: Secondary | ICD-10-CM

## 2024-03-03 DIAGNOSIS — G4739 Other sleep apnea: Secondary | ICD-10-CM

## 2024-03-03 DIAGNOSIS — Z720 Tobacco use: Secondary | ICD-10-CM | POA: Diagnosis not present

## 2024-03-03 DIAGNOSIS — B370 Candidal stomatitis: Secondary | ICD-10-CM

## 2024-03-03 DIAGNOSIS — J3089 Other allergic rhinitis: Secondary | ICD-10-CM

## 2024-03-03 MED ORDER — VALSARTAN 40 MG PO TABS: 40.0000 mg | ORAL_TABLET | Freq: Every day | ORAL | 3 refills | Status: DC

## 2024-03-03 MED ORDER — NYSTATIN 100000 UNIT/ML MT SUSP: 5.0000 mL | Freq: Four times a day (QID) | OROMUCOSAL | 0 refills | Status: AC

## 2024-03-03 NOTE — Telephone Encounter (Signed)
 Spoke to Mrs. Mochizuki, she reports patient symptoms have improved. No cough or wheezing. Per Dr. Tamea, pt can stop nebulizer tx, and use PRN. Patient advised to continue Trelegy daily, rinse mouth with baking soda to prevent thrush.  D/C Stioloto, per Dr. Tamea. NFN.

## 2024-03-03 NOTE — Telephone Encounter (Signed)
 Does not look like we prescribed.

## 2024-03-03 NOTE — Telephone Encounter (Signed)
 Pt is requesting a refill for nortriptyline (PAMELOR) 10 MG capsule.  Pharmacy: [facility] [phone]

## 2024-03-04 ENCOUNTER — Encounter: Payer: Self-pay | Admitting: Physician Assistant

## 2024-03-11 ENCOUNTER — Inpatient Hospital Stay: Admitting: Pulmonary Disease

## 2024-03-11 ENCOUNTER — Encounter: Payer: Self-pay | Admitting: Physician Assistant

## 2024-03-11 ENCOUNTER — Ambulatory Visit: Admitting: Urology

## 2024-03-11 ENCOUNTER — Encounter: Payer: Self-pay | Admitting: Diagnostic Neuroimaging

## 2024-03-11 MED ORDER — DIVALPROEX SODIUM 250 MG PO DR TAB: 250.0000 mg | DELAYED_RELEASE_TABLET | Freq: Two times a day (BID) | ORAL | 4 refills | Status: AC

## 2024-03-11 NOTE — Addendum Note (Signed)
 Addended by: MARGARET CARNE R on: 03/11/2024 05:00 PM   Modules accepted: Orders

## 2024-03-13 NOTE — Telephone Encounter (Signed)
 Spoke to daughter  (checked DPR) states patients PCP already filled prescription. Daughter is aware of Dr Margaret recommendation  .

## 2024-03-14 ENCOUNTER — Encounter: Payer: Self-pay | Admitting: Pulmonary Disease

## 2024-03-14 ENCOUNTER — Ambulatory Visit: Admitting: Pulmonary Disease

## 2024-03-14 VITALS — BP 138/86 | HR 71 | Temp 97.9°F | Ht 70.0 in | Wt 198.0 lb

## 2024-03-14 DIAGNOSIS — J4489 Other specified chronic obstructive pulmonary disease: Secondary | ICD-10-CM

## 2024-03-14 DIAGNOSIS — J439 Emphysema, unspecified: Secondary | ICD-10-CM | POA: Diagnosis not present

## 2024-03-14 DIAGNOSIS — F17210 Nicotine dependence, cigarettes, uncomplicated: Secondary | ICD-10-CM

## 2024-03-14 NOTE — Progress Notes (Signed)
 Subjective:    Patient ID: Henry Owens, male    DOB: 08-May-1960, 63 y.o.   MRN: [id number]  Patient Care Team: Ostwalt, Janna, PA-C as PCP - General (Physician Assistant) Jacobo Evalene PARAS, MD as Consulting Physician (Oncology)  Chief Complaint  Patient presents with   COPD    No breathing problems.     BACKGROUND/INTERVAL:Patient is a 63 year old former smoker (81 pack years) with a history as noted below, who presents for follow-up of cough persistent after COVID-19 infection in March 2025. He has underlying history of COPD.  He initially was seen on 27 July 2023, last visit here was on 27 November 2023.  Minor exacerbation on 29 February 2024 that required evaluation in the ED.  He did not require admission to the hospital.  HPI Discussed the use of AI scribe software for clinical note transcription with the patient, who gave verbal consent to proceed.  History of Present Illness   Henry Owens is a 63 year old male with COPD who presents for follow-up after a recent evaluation in the ED for respiratory issues.  He presents with his wife Tawni.  His COPD is currently well-managed with Trelegy 200, and his cough is manageable. No significant shortness of breath, as it is well-controlled.  Approximately two weeks ago, he experienced labored breathing, prompting a hospital visit at [facility] ED. During this visit, he was treated with two antibiotics doxycycline  and Omnicef , and his chest x-ray showed some 'fluffy' areas. He completed a three-day course of antibiotics.  Patient likely had atypical pneumonia.  He has not had any sequela from that episode and felt back to baseline after he completed steroid and antibiotic regimen.  His last lung cancer screening in August was unremarkable, and he is not due for another PFT until next August.   He unfortunately continues to smoke 1 to 1-1/2 packs of cigarettes per day.  The patient was counseled regards to  discontinuation of smoking.     DATA 11/27/2023 PFTs: FEV1 2.94 L or 82% predicted, FVC 3.97 L or 84% predicted, FEV1/FVC 74%, lung volumes are normal with only mild air trapping noted.  Diffusion capacity moderately reduced MIP/MEP consistent with possible deconditioning.  Study is consistent with obstructive airways disease, minimal and moderate diffusion defect.  Consistent with COPD.  Review of Systems A 10 point review of systems was performed and it is as noted above otherwise negative.   Patient Active Problem List   Diagnosis Date Noted   Tobacco abuse 01/01/2024   Allergic rhinitis due to allergen 01/01/2024   Thrush 12/24/2023   Other sleep apnea 11/13/2023   Neck pain 11/13/2023   Depression, recurrent 11/13/2023   Syncope and collapse 10/27/2023   B12 deficiency anemia 12/14/2022   Synthetic cannabinoid withdrawal (HCC) 12/13/2022   Cannabinoid hyperemesis syndrome 12/12/2022   Hypokalemia 12/11/2022   Overweight (BMI 25.0-29.9) 12/11/2022   Intractable nausea and vomiting 12/11/2022   CAP (community acquired pneumonia) 12/10/2022   Fall 11/26/2022   COPD with acute exacerbation (HCC) 02/07/2022   Weakness 02/07/2022   Thrombocytopenia 02/07/2022   Essential hypertension 02/06/2022   Hyponatremia 02/06/2022   CKD stage 3a, GFR 45-59 ml/min (HCC) 02/06/2022   Sepsis (HCC) 11/10/2021   AKI (acute kidney injury) 11/10/2021   Acute confusion 11/10/2021   Chronic, continuous use of opioids 11/10/2021   Aspiration pneumonia (HCC) 10/12/2021   Right upper quadrant pain 10/12/2021   Constipation 10/12/2021   Chronic pain 10/12/2021   Elevated troponin  10/12/2021   Acute respiratory failure with hypoxia and hypercapnia (HCC) 10/09/2021   Opiate overdose (HCC)    Unresponsive state    Acute respiratory failure due to COVID-19 (HCC) 07/05/2021   Current smoker 04/01/2021   Abnormal bone marrow examination 05/01/2020    Social History   Tobacco Use   Smoking  status: Every Day    Current packs/day: 1.50    Average packs/day: 2.0 packs/day for 42.8 years (84.8 ttl pk-yrs)    Types: Cigarettes    Start date: 1982    Last attempt to quit: 06/22/2021   Smokeless tobacco: Never  Substance Use Topics   Alcohol use: No    Allergies  Allergen Reactions   Wound Dressing Adhesive Other (See Comments)    BLISTER   Azithromycin  Rash and Dermatitis   Penicillin G Rash    Has patient had a PCN reaction causing immediate rash, facial/tongue/throat swelling, SOB or lightheadedness with hypotension: Yes Has patient had a PCN reaction causing severe rash involving mucus membranes or skin necrosis: No Has patient had a PCN reaction that required hospitalization: No Has patient had a PCN reaction occurring within the last 10 years: No If all of the above answers are NO, then may proceed with Cephalosporin use.     Current Meds  Medication Sig   acetaminophen  (TYLENOL ) 500 MG tablet Take 1,000 mg by mouth in the morning, at noon, and at bedtime.   albuterol  (PROVENTIL ) (2.5 MG/3ML) 0.083% nebulizer solution Take 3 mLs (2.5 mg total) by nebulization every 4 (four) hours as needed for wheezing or shortness of breath.   albuterol  (VENTOLIN  HFA) 108 (90 Base) MCG/ACT inhaler Inhale 2 puffs into the lungs every 4 (four) hours as needed for wheezing or shortness of breath.   AMBULATORY NON FORMULARY MEDICATION Trimix (30/1/10)-(Pap/Phent/PGE)  Test Dose  1ml vial   Qty #3 Refills 0  [facility] [phone] Fax [phone]   aspirin  EC 81 MG tablet Take 81 mg by mouth daily. Swallow whole.   atenolol  (TENORMIN ) 25 MG tablet Take 1 tablet (25 mg total) by mouth 2 (two) times daily.   atorvastatin  (LIPITOR) 20 MG tablet Take 1 tablet (20 mg total) by mouth daily.   Cholecalciferol (D 1000) 25 MCG (1000 UT) capsule Take 1,000 Units by mouth daily.   cyanocobalamin  1000 MCG tablet Take 1 tablet (1,000 mcg total) by mouth daily.   divalproex   (DEPAKOTE ) 250 MG DR tablet Take 1 tablet (250 mg total) by mouth 2 (two) times daily.   doxycycline  (VIBRA -TABS) 100 MG tablet Take 1 tablet (100 mg total) by mouth 2 (two) times daily for 14 days.   fenofibrate  (TRICOR ) 48 MG tablet Take 1 tablet (48 mg total) by mouth daily.   fluticasone  (FLONASE ) 50 MCG/ACT nasal spray Place 1 spray into both nostrils daily as needed for allergies or rhinitis.   Fluticasone -Umeclidin-Vilant (TRELEGY ELLIPTA ) 200-62.5-25 MCG/ACT AEPB Inhale 1 Inhaler into the lungs as directed.   Fluticasone -Umeclidin-Vilant (TRELEGY ELLIPTA ) 200-62.5-25 MCG/ACT AEPB Inhale 1 puff into the lungs daily.   gabapentin  (NEURONTIN ) 300 MG capsule Take 3 capsules (900 mg total) by mouth 2 (two) times daily.   magic mouthwash (nystatin , lidocaine , diphenhydrAMINE , alum & mag hydroxide) suspension Swish and spit 5 mLs 4 (four) times daily.   nortriptyline (PAMELOR) 10 MG capsule Take 10 mg by mouth daily.   OXYGEN Inhale into the lungs. As needed Has not used in a year   pantoprazole  (PROTONIX ) 40 MG tablet TAKE 1 TABLET(40 MG)  BY MOUTH DAILY   QUEtiapine (SEROQUEL) 25 MG tablet Take 75 mg by mouth at bedtime.   Spacer/Aero-Holding Raguel FRENCH Use as directed for inhaler use   valsartan  (DIOVAN ) 40 MG tablet Take 1 tablet (40 mg total) by mouth daily.   venlafaxine  XR (EFFEXOR  XR) 75 MG 24 hr capsule Take 1 capsule (75 mg total) by mouth daily with breakfast.    Immunization History  Administered Date(s) Administered   PFIZER(Purple Top)SARS-COV-2 Vaccination 02/05/2020, 03/12/2020   PNEUMOCOCCAL CONJUGATE-20 07/25/2021   Pneumococcal Conjugate,unspecified 07/25/2021      Objective:     Vitals:   03/14/24 1058  BP: 138/86  Pulse: 71  Temp: 97.9 F (36.6 C)  Height: 5' 10 (1.778 m)  Weight: 198 lb (89.8 kg)  SpO2: 96%  TempSrc: Temporal  BMI (Calculated): 28.41     SpO2: 96 %  GENERAL: Awake and alert, ambulatory with assistance of a cane.  No conversational  dyspnea. HEAD: Normocephalic, atraumatic.  EYES: Pupils equal, round, reactive to light.  No scleral icterus.  MOUTH: Dentures both, no thrush. NECK: Supple. No thyromegaly. Trachea midline. No JVD.  No adenopathy. PULMONARY: Good air entry bilaterally.  Coarse, otherwise, no adventitious sounds. CARDIOVASCULAR: S1 and S2. Regular rate and rhythm.  No rubs, murmurs or gallops heard. ABDOMEN: Benign. MUSCULOSKELETAL: No joint deformity, no clubbing, no edema.  NEUROLOGIC: Gait moderately unsteady, ambulates with a cane.  Appears to have some mild dysarthria.  No overt asymmetry. SKIN: Intact,warm,dry. PSYCH: Flat affect, appropriate otherwise.       Assessment & Plan:     ICD-10-CM   1. COPD with chronic bronchitis and emphysema (HCC)  J44.89    J43.9     2. Tobacco dependence due to cigarettes  F17.210      Discussion:    Chronic obstructive pulmonary disease (COPD) COPD is well-controlled with Trelegy. No current dyspnea or wheezing. Recent ED visit for labored breathing, likely due to a atypical pneumonia illness, treated successfully with no sequela. Last lung cancer screening in August was lung RADS 2. - Continue Trelegy for COPD management - Scheduled follow-up appointment in three months  Recent acute respiratory illness (likely atypical) Recent acute respiratory illness likely atypical in etiology, as indicated by chest x-ray findings. Treated with antibiotics with excellent response.   Follow-up in 3 months time.    Smoking/Tobacco Cessation Counseling Egor Fullilove is a current user of tobacco or nicotine products. He is not ready to quit at this time. Counseling provided today addressed the risks of continued use and the benefits of cessation. Discussed tobacco/nicotine use history, readiness to quit, and evidence-based treatment options including behavioral strategies, support resources, and pharmacologic therapies. Provided encouragement and educational  materials on steps and resources to quit smoking. Patient questions were addressed, and follow-up recommended for continued support. Total time spent on counseling: 5 minutes.    Advised if symptoms do not improve or worsen, to please contact office for sooner follow up or seek emergency care.    I spent 35 minutes of dedicated to the care of this patient on the date of this encounter to include pre-visit review of records, face-to-face time with the patient discussing conditions above, post visit ordering of testing, clinical documentation with the electronic health record, making appropriate referrals as documented, and communicating necessary findings to members of the patients care team.     C. Leita Sanders, MD Advanced Bronchoscopy PCCM [facility]    *This note was generated using voice recognition software/Dragon  and/or AI transcription program.  Despite best efforts to proofread, errors can occur which can change the meaning. Any transcriptional errors that result from this process are unintentional and may not be fully corrected at the time of dictation.

## 2024-03-14 NOTE — Patient Instructions (Signed)
 VISIT SUMMARY:  You had a follow-up appointment today to check on your COPD and recent respiratory issues. Your COPD is well-managed with your current medication, and your recent hospitalization for labored breathing was likely due to a viral illness. Your last lung cancer screening was normal, and you are not due for another breathing test until next August.  YOUR PLAN:  -CHRONIC OBSTRUCTIVE PULMONARY DISEASE (COPD): COPD is a chronic lung condition that makes it hard to breathe. Your COPD is well-controlled with your current medication, Trelegy. You should continue taking Trelegy as prescribed. We will have a follow-up appointment in three months to monitor your condition.  -RECENT ACUTE RESPIRATORY ILLNESS (LIKELY VIRAL): You recently had a respiratory illness that was likely caused by a virus. Your chest x-ray showed some 'fluffy' areas, and you were treated with antibiotics as a precaution. You have completed the course of antibiotics, and there are no further actions needed at this time.  -TOBACCO DEPENDENCE DUE TO CIGARETTES: Please continue to work on quitting smoking.  This is the most important thing that you could do for your lungs.  There is blood so much that we can do with inhalers.  INSTRUCTIONS:  Please continue taking Trelegy as prescribed for your COPD. We will schedule a follow-up appointment in three months to monitor your condition.

## 2024-03-16 NOTE — Progress Notes (Deleted)
 03/16/2024 5:46 PM  Henry Owens 09-25-1960 [id number]   Referring provider: Dineen Channel, PA-C [address] [place],  [place] [address]  Urological history: 1. Erectile dysfunction - Tadalafil  5 mg daily and augmenting with tadalafil  20 mg on demand dosing   2.  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 Incontinence - night time incontinence   5. BPH with LU TS - PSA (04/2023) 0.6  No chief complaint on file.  HPI: Henry Owens is a 63 y.o. male who presents today for ICI titration.    Previous records reviewed.     He has been experiencing issues with ED for 2 years.   He is having difficulty with achieving and maintaining erections.  He is no longer having nocturnal tumescence or having morning erections.  He reports persistent ED despite the use of PDE5i's.  No history of priapism or penile trauma.  ***   Physical Exam:  There were no vitals taken for this visit.  Constitutional:  Well nourished. Alert and oriented, No acute distress. GU: No CVA tenderness.  No bladder fullness or masses.  Patient with circumcised/uncircumcised phallus. ***Foreskin easily retracted***  Urethral meatus is patent.  No penile discharge. No penile lesions or rashes.  Psychiatric: Normal mood and affect.   Procedure *** Patient's left corpus cavernosum is identified.  An area near the base of the penis is cleansed with rubbing alcohol.  Careful to avoid the dorsal vein, 2 mcg of Trimix (papaverine 30 mg, phentolamine  1 mg and prostaglandin E1 10 mcg, Lot # ***@*** exp # *** is injected at a 90 degree angle into the left *** corpus cavernosum near the base of the penis.  Patient experienced a very firm erection in 15 minutes.    Patient's right corpus cavernosum is identified.  An area near the base of the penis is cleansed with rubbing alcohol.  Careful to avoid the dorsal vein,  2 mcg of Trimix (papaverine 30 mg, phentolamine   1 mg and prostaglandin E1 10 mcg, Lot # ***@*** exp # *** is injected at a 90 degree angle into the right corpus cavernosum near the base of the penis.  Patient experienced a very firm erection in 15 minutes.    Patient's left corpus cavernosum is identified.  An area near the base of the penis is cleansed with rubbing alcohol.  Careful to avoid the dorsal vein, 2 mcg of Trimix (papaverine 30 mg, phentolamine  1 mg and prostaglandin E1 10 mcg, Lot # ***@*** exp # *** is injected at a 90 degree angle into the left *** corpus cavernosum near the base of the penis.  Patient experienced a very firm erection in 15 minutes.    Patient's right corpus cavernosum is identified.  An area near the base of the penis is cleansed with rubbing alcohol.  Careful to avoid the dorsal vein, 2 mcg of Trimix (papaverine 30 mg, phentolamine  1 mg and prostaglandin E1 10 mcg, Lot # ***@*** exp # *** is injected at a 90 degree angle into the right corpus cavernosum near the base of the penis.  Patient experienced a very firm erection in 15 minutes.     Assessment & Plan:    1.  Erectile dysfunction - taught proper injection technique, including sterile handling, correct anatomical location and dosing - advised to use no more than once in 48-72 hours; instructed to seek care if erection persists beyond 4 hours    No follow-ups  on file.  Clotilda Cornwall, PA-C   [facility] [address] [place], [place] [address] [phone]  Time Spent: Total time spent on the day of the encounter to include pre-visit record review, face-to-face time with the patient, and post-visit ordering of tests.  *** minutes.

## 2024-03-17 ENCOUNTER — Ambulatory Visit: Admitting: Urology

## 2024-03-17 DIAGNOSIS — N529 Male erectile dysfunction, unspecified: Secondary | ICD-10-CM

## 2024-03-18 ENCOUNTER — Other Ambulatory Visit: Payer: Self-pay | Admitting: Physician Assistant

## 2024-03-18 ENCOUNTER — Encounter: Payer: Self-pay | Admitting: Physician Assistant

## 2024-03-18 DIAGNOSIS — I10 Essential (primary) hypertension: Secondary | ICD-10-CM

## 2024-03-19 ENCOUNTER — Telehealth: Payer: Self-pay | Admitting: Physician Assistant

## 2024-03-19 ENCOUNTER — Other Ambulatory Visit: Payer: Self-pay | Admitting: Physician Assistant

## 2024-03-19 ENCOUNTER — Other Ambulatory Visit: Payer: Self-pay

## 2024-03-19 ENCOUNTER — Telehealth: Payer: Self-pay

## 2024-03-19 DIAGNOSIS — I10 Essential (primary) hypertension: Secondary | ICD-10-CM

## 2024-03-19 MED ORDER — ATENOLOL 25 MG PO TABS: 25.0000 mg | ORAL_TABLET | Freq: Two times a day (BID) | ORAL | 0 refills | Status: DC

## 2024-03-19 NOTE — Telephone Encounter (Signed)
 Called [facility] spoke with tech and she advised they see 1 refill remaining but looks like the case was closed and needs to be reopened for his rx atenolol . Waited in line to speak with pharmacist and no response for 15 minutes.   Redialed number at 3:42 PM. Spoke with pharmacist she stated medication was transferred to another pharmacy due to transfer pt no longer has refills at [facility]. Was placed on hold again to speak with another person.   Pharmacy tech stated it will arrive Friday, I stated pt has stated he has been out. They will work on medication as they just received it today? They will have his medication ready.

## 2024-03-19 NOTE — Telephone Encounter (Signed)
 Patient's home number called and his daughter Emmalene answered. Advised her that atenolol  was sent to [facility] for a 6 month supply in August, so there should be a refill available. He will need to call [facility] for that refill and if they don't have it to call us  back. She verbalized understanding.    Copied from CRM #[id number]. Topic: Clinical - Medical Advice >> Mar 19, 2024  1:01 PM Winona R wrote: Pt would like to know what he should do about being out of medication. Just submitted a med refill today for atenolol  (TENORMIN ) 25 MG tablet

## 2024-03-19 NOTE — Telephone Encounter (Signed)
 Pt was advised regarding medication and having enough refills. Pt was advised to contact pharmacy. Per Garrel wife she noted will contact pharmacy 03/18/24

## 2024-03-19 NOTE — Telephone Encounter (Signed)
 Copied from CRM #[id number]. Topic: Clinical - Medical Advice >> Mar 19, 2024  1:01 PM Winona R wrote: Pt would like to know what he should do about being out of medication. Just submitted a med refill today for atenolol  (TENORMIN ) 25 MG tablet >> Mar 19, 2024  3:14 PM Vivian Z wrote: Emmalene Novak (Daughter) is calling to check status of medication refill and stated that they can't wait 3 days as her father is out of meds and she wants to make sure he has his refill before the upcoming holiday. Emmalene stated that she contacted [facility] pharmacy to check for refill and was told that the prescription is closed and no refills are available. Please contact for assistance.

## 2024-03-19 NOTE — Telephone Encounter (Signed)
 Copied from CRM #[id number]. Topic: Clinical - Medication Refill >> Mar 19, 2024 12:59 PM Winona R wrote: Medication: atenolol  (TENORMIN ) 25 MG tablet  Has the patient contacted their pharmacy? No (Agent: If no, request that the patient contact the pharmacy for the refill. If patient does not wish to contact the pharmacy document the reason why and proceed with request.) (Agent: If yes, when and what did the pharmacy advise?)  This is the patient's preferred pharmacy:  [id number] [place], [place] - [address] AT [facility] OF [address] [place] [address] Phone: [phone] Fax: [phone]  Is this the correct pharmacy for this prescription? Yes If no, delete pharmacy and type the correct one.   Has the prescription been filled recently? Yes  Is the patient out of the medication? Yes  Has the patient been seen for an appointment in the last year OR does the patient have an upcoming appointment? Yes  Can we respond through MyChart? Yes  Agent: Please be advised that Rx refills may take up to 3 business days. We ask that you follow-up with your pharmacy.

## 2024-03-25 ENCOUNTER — Encounter: Payer: Self-pay | Admitting: Diagnostic Neuroimaging

## 2024-03-25 ENCOUNTER — Telehealth: Payer: Self-pay | Admitting: Diagnostic Neuroimaging

## 2024-03-25 ENCOUNTER — Ambulatory Visit: Admitting: Physician Assistant

## 2024-03-25 ENCOUNTER — Encounter: Payer: Self-pay | Admitting: Physician Assistant

## 2024-03-25 ENCOUNTER — Ambulatory Visit: Admitting: Diagnostic Neuroimaging

## 2024-03-25 VITALS — BP 155/74 | HR 67 | Ht 70.0 in | Wt 198.3 lb

## 2024-03-25 VITALS — BP 171/92 | HR 63 | Wt 200.1 lb

## 2024-03-25 DIAGNOSIS — M25561 Pain in right knee: Secondary | ICD-10-CM | POA: Diagnosis not present

## 2024-03-25 DIAGNOSIS — N1831 Chronic kidney disease, stage 3a: Secondary | ICD-10-CM

## 2024-03-25 DIAGNOSIS — I10 Essential (primary) hypertension: Secondary | ICD-10-CM | POA: Diagnosis not present

## 2024-03-25 DIAGNOSIS — J449 Chronic obstructive pulmonary disease, unspecified: Secondary | ICD-10-CM | POA: Diagnosis not present

## 2024-03-25 DIAGNOSIS — Z720 Tobacco use: Secondary | ICD-10-CM

## 2024-03-25 DIAGNOSIS — R55 Syncope and collapse: Secondary | ICD-10-CM | POA: Diagnosis not present

## 2024-03-25 NOTE — Progress Notes (Addendum)
 Established patient visit  Patient: Henry Owens   DOB: 12-01-1960   63 y.o. Male  MRN: [id number] Visit Date: 03/25/2024  Today's healthcare provider: Jolynn Spencer, PA-C   Chief Complaint  Patient presents with   Follow-up    1 month    Subjective      Discussed the use of AI scribe software for clinical note transcription with the patient, who gave verbal consent to proceed.  History of Present Illness Corie Vavra is a 63 year old male with hypertension and COPD who presents with concerns about blood pressure management and recent falls.  He reports nocturnal hypotension with readings as low as 60/40 mmHg that led to two falls. He takes atenolol  twice daily and stopped valsartan  because of low blood pressure. In clinic his blood pressure is 178/84 mmHg, improving to 155 mmHg after rest.  He fell in the kitchen, with new knee pain, swelling, and bruising. He has had two prior knee surgeries and notes persistent puffiness behind the knee. He is not using pain medication and has considered Celebrex  but is concerned about kidney and gastrointestinal side effects.  He has COPD on daily Trelegy with stable shortness of breath and chest pain and no recent exacerbations. He is not using albuterol  currently.  He notes leg weakness, shaking, and numbness and has discussed similar symptoms with his neurologist but has not contacted him about this episode.  He reports poor diet and low fluid intake, which he links to weakness and falls. He has chronic kidney disease with kidney function of 47%. He has resumed smoking half a pack per day after a period of cessation and does not exercise regularly.       12/21/2023    1:32 PM 11/12/2023    1:25 PM 06/29/2023    1:09 PM  Depression screen PHQ 2/9  Decreased Interest 2 2 2   Down, Depressed, Hopeless 2 2 2   PHQ - 2 Score 4 4 4   Altered sleeping 2 1 3   Tired, decreased energy 2 1 3   Change in appetite 2 0 0  Feeling  bad or failure about yourself  2 2 2   Trouble concentrating 2 0 2  Moving slowly or fidgety/restless 1 0 0  Suicidal thoughts 0 0 0  PHQ-9 Score 15  8  14    Difficult doing work/chores Very difficult Somewhat difficult Very difficult     Data saved with a previous flowsheet row definition      12/21/2023    1:33 PM 11/12/2023    1:26 PM 06/29/2023    1:09 PM 06/01/2023    1:55 PM  GAD 7 : Generalized Anxiety Score  Nervous, Anxious, on Edge 2 2 3 3   Control/stop worrying 2 2 3 3   Worry too much - different things 2 2 3 3   Trouble relaxing 2 2 3 3   Restless 2 1 0 0  Easily annoyed or irritable 2 1 0 0  Afraid - awful might happen 2 1 0 3  Total GAD 7 Score 14 11 12 15   Anxiety Difficulty Very difficult Not difficult at all Very difficult Very difficult    Medications: Outpatient Medications Prior to Visit  Medication Sig   acetaminophen  (TYLENOL ) 500 MG tablet Take 1,000 mg by mouth in the morning, at noon, and at bedtime.   albuterol  (PROVENTIL ) (2.5 MG/3ML) 0.083% nebulizer solution Take 3 mLs (2.5 mg total) by nebulization every 4 (four) hours as needed for wheezing or shortness of breath.  albuterol  (VENTOLIN  HFA) 108 (90 Base) MCG/ACT inhaler Inhale 2 puffs into the lungs every 4 (four) hours as needed for wheezing or shortness of breath.   AMBULATORY NON FORMULARY MEDICATION Trimix (30/1/10)-(Pap/Phent/PGE)  Test Dose  1ml vial   Qty #3 Refills 0  [facility] [phone] Fax [phone]   aspirin  EC 81 MG tablet Take 81 mg by mouth daily. Swallow whole.   atenolol  (TENORMIN ) 25 MG tablet Take 1 tablet (25 mg total) by mouth 2 (two) times daily.   atorvastatin  (LIPITOR) 20 MG tablet Take 1 tablet (20 mg total) by mouth daily.   Cholecalciferol (D 1000) 25 MCG (1000 UT) capsule Take 1,000 Units by mouth daily.   cyanocobalamin  1000 MCG tablet Take 1 tablet (1,000 mcg total) by mouth daily.   divalproex  (DEPAKOTE ) 250 MG DR tablet Take 1 tablet (250 mg total) by  mouth 2 (two) times daily.   fenofibrate  (TRICOR ) 48 MG tablet Take 1 tablet (48 mg total) by mouth daily.   fluticasone  (FLONASE ) 50 MCG/ACT nasal spray Place 1 spray into both nostrils daily as needed for allergies or rhinitis.   Fluticasone -Umeclidin-Vilant (TRELEGY ELLIPTA ) 200-62.5-25 MCG/ACT AEPB Inhale 1 Inhaler into the lungs as directed.   Fluticasone -Umeclidin-Vilant (TRELEGY ELLIPTA ) 200-62.5-25 MCG/ACT AEPB Inhale 1 puff into the lungs daily.   gabapentin  (NEURONTIN ) 300 MG capsule Take 3 capsules (900 mg total) by mouth 2 (two) times daily.   magic mouthwash (nystatin , lidocaine , diphenhydrAMINE , alum & mag hydroxide) suspension Swish and spit 5 mLs 4 (four) times daily.   nortriptyline (PAMELOR) 10 MG capsule Take 10 mg by mouth daily.   OXYGEN Inhale into the lungs. As needed Has not used in a year   pantoprazole  (PROTONIX ) 40 MG tablet TAKE 1 TABLET(40 MG) BY MOUTH DAILY   QUEtiapine (SEROQUEL) 25 MG tablet Take 75 mg by mouth at bedtime.   Spacer/Aero-Holding Raguel FRENCH Use as directed for inhaler use   tiZANidine  (ZANAFLEX ) 4 MG tablet Take 4 mg by mouth 3 (three) times daily.   valsartan  (DIOVAN ) 40 MG tablet Take 1 tablet (40 mg total) by mouth daily.   venlafaxine  XR (EFFEXOR  XR) 75 MG 24 hr capsule Take 1 capsule (75 mg total) by mouth daily with breakfast.   No facility-administered medications prior to visit.    Review of Systems All negative Except see HPI       Objective    BP (!) 155/74   Pulse 67   Ht 5' 10 (1.778 m)   Wt 198 lb 4.8 oz (89.9 kg)   SpO2 99%   BMI 28.45 kg/m     Physical Exam Vitals reviewed.  Constitutional:      General: He is not in acute distress.    Appearance: Normal appearance. He is not diaphoretic.  HENT:     Head: Normocephalic and atraumatic.  Eyes:     General: No scleral icterus.    Conjunctiva/sclera: Conjunctivae normal.  Cardiovascular:     Rate and Rhythm: Normal rate and regular rhythm.     Pulses:  Normal pulses.     Heart sounds: Normal heart sounds. No murmur heard. Pulmonary:     Effort: Pulmonary effort is normal. No respiratory distress.     Breath sounds: Normal breath sounds. No wheezing or rhonchi.  Musculoskeletal:     Cervical back: Neck supple.     Right lower leg: No edema.     Left lower leg: No edema.  Lymphadenopathy:     Cervical: No cervical  adenopathy.  Skin:    General: Skin is warm and dry.     Findings: No rash.  Neurological:     Mental Status: He is alert and oriented to person, place, and time. Mental status is at baseline.  Psychiatric:        Mood and Affect: Mood normal.        Behavior: Behavior normal.      No results found for any visits on 03/25/24.      Assessment & Plan Hypertension with orthostatic hypotension and recurrent falls Chronic and unstable  hypertension with orthostatic hypotension causing falls. Blood pressure variable, recent 178/84 mmHg. Possible factors: deconditioning, nutrition, hydration, medication side effects, neurological causes. - Adjusted valsartan  to 20 mg  daily in the morning. - Continue atenolol  twice daily. - Encouraged regular exercise, 40-45 minutes daily. - Advised on proper nutrition and hydration. - Instructed to monitor blood pressure regularly and record readings. - Referred to his neurology for evaluation of leg weakness and shaking. - Scheduled follow-up in two weeks to reassess blood pressure and symptoms.  Chronic kidney disease, unspecified stage Chronic kidney disease with eGFR 47 mL/min/1.73 m2. Risk of worsening due to dehydration and NSAID use. - Advised drinking 8-12 glasses of water  daily. - Advised avoiding NSAIDs like Celebrex . - Will consider referral to nephrology if kidney function declines further. Will follow-up  Knee pain and swelling Knee pain and swelling post-surgeries, exacerbated by recent fall. Symptoms improved, no x-ray needed unless worsens. - Consider x-ray if  symptoms worsen or difficulty walking. - Encouraged physical therapy to improve mobility and strength. Consider over-the-counter pain management Advised ice/heat therapy, sleeve/brace  Chronic obstructive pulmonary disease (COPD) Stable  COPD, continues Trelegy. - Continue Trelegy daily. Encourage physical therapy and daily exercise as tolerated Encouraged nicotine cessation Will follow-up  Nicotine dependence, current smoker Chronic  current smoker, half pack per day. Smoking cessation crucial for health and management of COPD and hypertension. - Encouraged reduction in smoking. - Discussed potential nicotine cessation strategies.  Will  Recheck   No orders of the defined types were placed in this encounter.   No follow-ups on file.   The patient was advised to call back or seek an in-person evaluation if the symptoms worsen or if the condition fails to improve as anticipated.  I discussed the assessment and treatment plan with the patient. The patient was provided an opportunity to ask questions and all were answered. The patient agreed with the plan and demonstrated an understanding of the instructions.  I, Jalayna Josten, PA-C have reviewed all documentation for this visit. The documentation on 03/25/2024  for the exam, diagnosis, procedures, and orders are all accurate and complete.  Jolynn Spencer, [facility], MMS [facility] [phone] (phone) [phone] (fax)  [facility]

## 2024-03-25 NOTE — Progress Notes (Signed)
 [facility]  PATIENT: Henry Owens DOB: [birth year]  REFERRING CLINICIAN: Ostwalt, Janna, PA-C HISTORY FROM: patient  REASON FOR VISIT: new consult   HISTORICAL  CHIEF COMPLAINT:  Chief Complaint  Patient presents with   RM 7     Patient is here with wife Henry Owens for drops in blood pressure in evening, episodes of falling out - last night had a close call with a falling episode around 11:30pm    Other    BP RECHECK     HISTORY OF PRESENT ILLNESS:   UPDATE (03/25/24, VRP): Since last visit, doing well until Nov 2025, has had a few more events of syncope. No triggering factors. Usually happens with standing up. Low BP 70/50 noted at home.   PRIOR HPI (12/05/23, VRP): 63 year old male here for evaluation of syncope with seizure.  2024 patient had 3 events of loss of consciousness.  Was evaluated by neurology and started on levetiracetam empirically.  In March 2025 episode of low blood pressure and passing out at home.  In July 2025 had another episode of sudden onset loss of consciousness, eyes open, staring, shakiness in the legs, associated with very low blood pressure.  He was taken to the hospital by EMS.  Was diagnosed with acute kidney disease.  Since then followed up with his neurologist who empirically started divalproex  250 mg twice a day.  Patient had been having some intermittent muscle jerks symptoms which also have improved.    REVIEW OF SYSTEMS: Full 14 system review of systems performed and negative with exception of: as per HPI.  ALLERGIES: Allergies  Allergen Reactions   Wound Dressing Adhesive Other (See Comments)    BLISTER   Azithromycin  Rash and Dermatitis   Penicillin G Rash    Has patient had a PCN reaction causing immediate rash, facial/tongue/throat swelling, SOB or lightheadedness with hypotension: Yes Has patient had a PCN reaction causing severe rash involving mucus membranes or skin necrosis: No Has patient had a PCN  reaction that required hospitalization: No Has patient had a PCN reaction occurring within the last 10 years: No If all of the above answers are NO, then may proceed with Cephalosporin use.     HOME MEDICATIONS: Outpatient Medications Prior to Visit  Medication Sig Dispense Refill   acetaminophen  (TYLENOL ) 500 MG tablet Take 1,000 mg by mouth in the morning, at noon, and at bedtime.     albuterol  (PROVENTIL ) (2.5 MG/3ML) 0.083% nebulizer solution Take 3 mLs (2.5 mg total) by nebulization every 4 (four) hours as needed for wheezing or shortness of breath. 75 mL 2   albuterol  (VENTOLIN  HFA) 108 (90 Base) MCG/ACT inhaler Inhale 2 puffs into the lungs every 4 (four) hours as needed for wheezing or shortness of breath. 8 g 2   AMBULATORY NON FORMULARY MEDICATION Trimix (30/1/10)-(Pap/Phent/PGE)  Test Dose  1ml vial   Qty #3 Refills 0  [facility] [phone] Fax [phone] 3 vial 0   aspirin  EC 81 MG tablet Take 81 mg by mouth daily. Swallow whole.     atenolol  (TENORMIN ) 25 MG tablet Take 1 tablet (25 mg total) by mouth 2 (two) times daily. 180 tablet 0   atorvastatin  (LIPITOR) 20 MG tablet Take 1 tablet (20 mg total) by mouth daily. 90 tablet 3   Cholecalciferol (D 1000) 25 MCG (1000 UT) capsule Take 1,000 Units by mouth daily.     divalproex  (DEPAKOTE ) 250 MG DR tablet Take 1 tablet (250 mg total) by mouth 2 (two)  times daily. 180 tablet 4   fenofibrate  (TRICOR ) 48 MG tablet Take 1 tablet (48 mg total) by mouth daily. 90 tablet 1   fluticasone  (FLONASE ) 50 MCG/ACT nasal spray Place 1 spray into both nostrils daily as needed for allergies or rhinitis.     Fluticasone -Umeclidin-Vilant (TRELEGY ELLIPTA ) 200-62.5-25 MCG/ACT AEPB Inhale 1 Inhaler into the lungs as directed.     Fluticasone -Umeclidin-Vilant (TRELEGY ELLIPTA ) 200-62.5-25 MCG/ACT AEPB Inhale 1 puff into the lungs daily. 60 each 3   gabapentin  (NEURONTIN ) 300 MG capsule Take 3 capsules (900 mg total) by mouth 2 (two)  times daily.     magic mouthwash (nystatin , lidocaine , diphenhydrAMINE , alum & mag hydroxide) suspension Swish and spit 5 mLs 4 (four) times daily. 180 mL 0   nortriptyline (PAMELOR) 10 MG capsule Take 10 mg by mouth daily.     OXYGEN Inhale into the lungs. As needed Has not used in a year     pantoprazole  (PROTONIX ) 40 MG tablet TAKE 1 TABLET(40 MG) BY MOUTH DAILY 90 tablet 1   QUEtiapine (SEROQUEL) 25 MG tablet Take 75 mg by mouth at bedtime.     Spacer/Aero-Holding Raguel FRENCH Use as directed for inhaler use 1 each 0   tiZANidine  (ZANAFLEX ) 4 MG tablet Take 4 mg by mouth 3 (three) times daily.     valsartan  (DIOVAN ) 40 MG tablet Take 1 tablet (40 mg total) by mouth daily. (Patient taking differently: Take 20 mg by mouth daily.) 90 tablet 3   venlafaxine  XR (EFFEXOR  XR) 75 MG 24 hr capsule Take 1 capsule (75 mg total) by mouth daily with breakfast. 90 capsule 1   cyanocobalamin  1000 MCG tablet Take 1 tablet (1,000 mcg total) by mouth daily. (Patient not taking: Reported on 03/25/2024) 30 tablet 0   No facility-administered medications prior to visit.    PAST MEDICAL HISTORY: Past Medical History:  Diagnosis Date   Allergy 2020   Anxiety    Arthritis    Blood transfusion without reported diagnosis 11/2022   Breast cancer (HCC)    COPD (chronic obstructive pulmonary disease) (HCC) 2023   Depression    Emphysema of lung (HCC)    Flu 07/2017   GERD (gastroesophageal reflux disease) 2023   Hyperlipidemia    Hypertension    Intractable nausea and vomiting 12/11/2022   Nodule of right lung    Oxygen deficiency 2023   Only when needed   Sleep apnea 2024   Stroke (HCC) 11/2022   Tinnitus     PAST SURGICAL HISTORY: Past Surgical History:  Procedure Laterality Date   APPENDECTOMY  1975   BACK SURGERY     COLONOSCOPY N/A 11/19/2023   Procedure: COLONOSCOPY;  Surgeon: Maryruth Ole DASEN, MD;  Location: [facility];  Service: Endoscopy;  Laterality: N/A;   COLONOSCOPY WITH  PROPOFOL  N/A 05/18/2023   Procedure: COLONOSCOPY WITH PROPOFOL ;  Surgeon: Maryruth Ole DASEN, MD;  Location: [facility];  Service: Endoscopy;  Laterality: N/A;   ESOPHAGOGASTRODUODENOSCOPY (EGD) WITH PROPOFOL  N/A 05/18/2023   Procedure: ESOPHAGOGASTRODUODENOSCOPY (EGD) WITH PROPOFOL ;  Surgeon: Maryruth Ole DASEN, MD;  Location: [facility];  Service: Endoscopy;  Laterality: N/A;   HEMOSTASIS CLIP PLACEMENT  05/18/2023   Procedure: HEMOSTASIS CLIP PLACEMENT;  Surgeon: Maryruth Ole DASEN, MD;  Location: [facility];  Service: Endoscopy;;   KNEE ARTHROSCOPY WITH MEDIAL MENISECTOMY Right 08/20/2017   Procedure: KNEE ARTHROSCOPY WITH PARTIAL MEDIAL AND LATERAL MENISECTOMY,PARTIAL SYNOVECTOMY,CHONDROPLASTY, LOOSE BODY REMOVAL;  Surgeon: Leora Lynwood SAUNDERS, MD;  Location: [facility];  Service: Orthopedics;  Laterality: Right;   KNEE SURGERY  1975   1996, 2000   POLYPECTOMY  05/18/2023   Procedure: POLYPECTOMY;  Surgeon: Maryruth Ole DASEN, MD;  Location: [facility];  Service: Endoscopy;;   SPINE SURGERY  9 1 89   Last of multiple procedures,  limbar fusion   SUBMUCOSAL LIFTING INJECTION  05/18/2023   Procedure: SUBMUCOSAL LIFTING INJECTION;  Surgeon: Maryruth Ole DASEN, MD;  Location: [facility];  Service: Endoscopy;;    FAMILY HISTORY: Family History  Problem Relation Age of Onset   Hypertension Mother    Stroke Mother    Cancer Mother        colon   Arthritis Mother    Depression Mother    Stroke Father    Arthritis Father    Hearing loss Father    Obesity Sister    Anxiety disorder Daughter    Anxiety disorder Son    Bladder Cancer Neg Hx    Prostate cancer Neg Hx    Hematuria Neg Hx    Seizures Neg Hx    Migraines Neg Hx     SOCIAL HISTORY: Social History   Socioeconomic History   Marital status: Married    Spouse name: Not on file   Number of children: Not on file   Years of education: Not on file   Highest education level: 11th grade  Occupational  History   Not on file  Tobacco Use   Smoking status: Every Day    Current packs/day: 1.50    Average packs/day: 2.0 packs/day for 42.8 years (84.8 ttl pk-yrs)    Types: Cigarettes    Start date: 89    Last attempt to quit: 06/22/2021   Smokeless tobacco: Never  Vaping Use   Vaping status: Never Used  Substance and Sexual Activity   Alcohol use: No   Drug use: Not Currently    Types: Marijuana   Sexual activity: Not Currently    Birth control/protection: Abstinence  Other Topics Concern   Not on file  Social History Narrative   8-10 sweet tea all day ( 2 cups of sugar to a gallon) - per wife    Social Drivers of Health   Financial Resource Strain: Medium Risk (03/24/2024)   Overall Financial Resource Strain (CARDIA)    Difficulty of Paying Living Expenses: Somewhat hard  Food Insecurity: Food Insecurity Present (03/24/2024)   Hunger Vital Sign    Worried About Running Out of Food in the Last Year: Sometimes true    Ran Out of Food in the Last Year: Never true  Transportation Needs: No Transportation Needs (03/24/2024)   PRAPARE - Administrator, Civil Service (Medical): No    Lack of Transportation (Non-Medical): No  Physical Activity: Inactive (03/24/2024)   Exercise Vital Sign    Days of Exercise per Week: 0 days    Minutes of Exercise per Session: Not on file  Stress: Stress Concern Present (03/24/2024)   [employer] of Occupational Health - Occupational Stress Questionnaire    Feeling of Stress: To some extent  Social Connections: Unknown (03/24/2024)   Social Connection and Isolation Panel    Frequency of Communication with Friends and Family: Patient declined    Frequency of Social Gatherings with Friends and Family: Patient declined    Attends Religious Services: Never    Database Administrator or Organizations: No    Attends Engineer, Structural: Not on file    Marital Status: Married  Catering Manager Violence:  Not At Risk (10/27/2023)    Humiliation, Afraid, Rape, and Kick questionnaire    Fear of Current or Ex-Partner: No    Emotionally Abused: No    Physically Abused: No    Sexually Abused: No     PHYSICAL EXAM  GENERAL EXAM/CONSTITUTIONAL: Vitals:  Vitals:   03/25/24 1604  BP: (!) 171/92  Pulse: 63  SpO2: 97%  Weight: 200 lb 1.6 oz (90.8 kg)   Body mass index is 28.71 kg/m. Wt Readings from Last 3 Encounters:  03/25/24 200 lb 1.6 oz (90.8 kg)  03/25/24 198 lb 4.8 oz (89.9 kg)  03/14/24 198 lb (89.8 kg)   Patient is in no distress; well developed, nourished and groomed; neck is supple  CARDIOVASCULAR: Examination of carotid arteries is normal; no carotid bruits Regular rate and rhythm, no murmurs Examination of peripheral vascular system by observation and palpation is normal  EYES: Ophthalmoscopic exam of optic discs and posterior segments is normal; no papilledema or hemorrhages No results found.  MUSCULOSKELETAL: Gait, strength, tone, movements noted in Neurologic exam below  NEUROLOGIC: MENTAL STATUS:      No data to display         awake, alert, oriented to person, place and time recent and remote memory intact normal attention and concentration language fluent, comprehension intact, naming intact fund of knowledge appropriate  CRANIAL NERVE:  2nd - no papilledema on fundoscopic exam 2nd, 3rd, 4th, 6th - pupils equal and reactive to light, visual fields full to confrontation, extraocular muscles intact, no nystagmus 5th - facial sensation symmetric 7th - facial strength symmetric 8th - hearing intact 9th - palate elevates symmetrically, uvula midline 11th - shoulder shrug symmetric 12th - tongue protrusion midline  MOTOR:  normal bulk and tone, full strength in the BUE, BLE  SENSORY:  normal and symmetric to light touch, temperature, vibration  COORDINATION:  finger-nose-finger, fine finger movements normal  REFLEXES:  deep tendon reflexes TRACE and  symmetric  GAIT/STATION:  narrow based gait; USING CANE     DIAGNOSTIC DATA (LABS, IMAGING, TESTING) - I reviewed patient records, labs, notes, testing and imaging myself where available.  Lab Results  Component Value Date   WBC 7.5 02/29/2024   HGB 12.6 (L) 02/29/2024   HCT 38.8 (L) 02/29/2024   MCV 89.4 02/29/2024   PLT 151 02/29/2024      Component Value Date/Time   NA 135 02/29/2024 1038   NA 140 05/28/2023 1052   K 4.3 02/29/2024 1038   CL 98 02/29/2024 1038   CO2 26 02/29/2024 1038   GLUCOSE 96 02/29/2024 1038   BUN 19 02/29/2024 1038   BUN 12 05/28/2023 1052   CREATININE 1.64 (H) 02/29/2024 1038   CALCIUM  9.0 02/29/2024 1038   PROT 7.7 02/29/2024 1038   PROT 6.5 05/28/2023 1052   ALBUMIN 3.6 02/29/2024 1038   ALBUMIN 4.4 05/28/2023 1052   AST 16 02/29/2024 1038   ALT 19 02/29/2024 1038   ALKPHOS 95 02/29/2024 1038   BILITOT 0.9 02/29/2024 1038   BILITOT 0.2 05/28/2023 1052   GFRNONAA 47 (L) 02/29/2024 1038   Lab Results  Component Value Date   CHOL 213 (H) 08/03/2023   HDL 24 (L) 08/03/2023   LDLCALC 112 (H) 08/03/2023   LDLDIRECT 55 11/28/2022   TRIG 443 (H) 08/03/2023   CHOLHDL 8.9 (H) 08/03/2023   Lab Results  Component Value Date   HGBA1C 5.9 (H) 05/28/2023   Lab Results  Component Value Date   VITAMINB12 661 01/22/2023  Lab Results  Component Value Date   TSH 2.100 05/28/2023     02/12/23  This 72 hour ambulatory EEG with video in the awake and asleep states is within normal limits.     ASSESSMENT AND PLAN  63 y.o. year old male here with:   Dx:  1. Recurrent syncope      PLAN:  RECURRENT LOSS OF CONSCIOUSNESS, SHAKING, LOW BP (dx: likely convulsive syncope to to hypotension; no post ictal confusion; no tongue biting or incontinence; seizure disorder less likely; last event 03/24/24)  - follow up with PCP and cardiology re: BP mgmt; encouraged BP log at home; reduce cigarettes; try to improve nutrition and hydration  -  continue divalproex  250mg  twice a day (may help myoclonus and mood)  - According to [place] law, you can not drive unless you are seizure / syncope free for at least 6 months and under physician's care.   - Please maintain precautions. Do not participate in activities where a loss of awareness could harm you or someone else. No swimming alone, no tub bathing, no hot tubs, no driving, no operating motorized vehicles (cars, ATVs, motocycles, etc), lawnmowers, power tools or firearms. No standing at heights, such as rooftops, ladders or stairs. Avoid hot objects such as stoves, heaters, open fires. Wear a helmet when riding a bicycle, scooter, skateboard, etc. and avoid areas of traffic. Set your water  heater to 120 degrees or less.    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    EDUARD FABIENE HANLON, MD 03/25/2024, 4:32 PM Certified in Neurology, Neurophysiology and Neuroimaging  [facility] [address] [place], [place] [address] [phone]

## 2024-03-25 NOTE — Telephone Encounter (Signed)
 Duplicate request, refilled on 03/19/24.  Requested Prescriptions  Pending Prescriptions Disp Refills   atenolol  (TENORMIN ) 25 MG tablet 180 tablet 0    Sig: Take 1 tablet (25 mg total) by mouth 2 (two) times daily.     Cardiovascular: Beta Blockers 2 Failed - 03/25/2024  8:59 AM      Failed - Cr in normal range and within 360 days    Creatinine, Ser  Date Value Ref Range Status  02/29/2024 1.64 (H) 0.61 - 1.24 mg/dL Final         Failed - Last BP in normal range    BP Readings from Last 1 Encounters:  03/25/24 (!) 155/74         Passed - Last Heart Rate in normal range    Pulse Readings from Last 1 Encounters:  03/25/24 67         Passed - Valid encounter within last 6 months    Recent Outpatient Visits           Today    [facility] [place], [place], PA-C   3 weeks ago Atypical pneumonia   [facility] [place], [place], PA-C   2 months ago COPD with acute exacerbation [facility])   [facility] [place], [place], PA-C   2 months ago Non-seasonal allergic rhinitis due to other allergic trigger   [facility] [place], [place], PA-C   3 months ago Essential hypertension    [facility] [place], [place], PA-C       Future Appointments             In 2 months Penumalli, Eduard SAUNDERS, MD [facility]

## 2024-03-25 NOTE — Patient Instructions (Addendum)
 RECURRENT LOSS OF CONSCIOUSNESS, SHAKING, LOW BP (dx: likely convulsive syncope to to hypotension; no post ictal confusion; no tongue biting or incontinence; seizure disorder less likely; last event 03/24/24)  - follow up with PCP and cardiology re: BP mgmt; encouraged BP log at home; reduce cigarettes; try to improve nutrition and hydration  - follow up with PCP and cardiology re: BP mgmt  - continue divalproex  250mg  twice a day (may help myoclonus and mood)  - According to [place] law, you can not drive unless you are seizure / syncope free for at least 6 months and under physician's care.   - Please maintain precautions. Do not participate in activities where a loss of awareness could harm you or someone else. No swimming alone, no tub bathing, no hot tubs, no driving, no operating motorized vehicles (cars, ATVs, motocycles, etc), lawnmowers, power tools or firearms. No standing at heights, such as rooftops, ladders or stairs. Avoid hot objects such as stoves, heaters, open fires. Wear a helmet when riding a bicycle, scooter, skateboard, etc. and avoid areas of traffic. Set your water  heater to 120 degrees or less.

## 2024-03-25 NOTE — Telephone Encounter (Signed)
 Pt's daughter has called asking for an appointment for pt to be seen, as pt has recently seen his PCP for a drop in pt's blood pressure in the evenings causing episodes of passing out.  Daughter accepted available appointment with Dr Margaret this afternoon and is aware of arrival time of 3:30

## 2024-03-28 ENCOUNTER — Telehealth: Payer: Self-pay

## 2024-03-28 NOTE — Telephone Encounter (Signed)
 Received notification that pt had cancelled titration study, however we are also receiving notices for the pt to come back and see Dr. Jess. Dr. Jess will need the results of the titration study to better advise the pt in a follow up appt.   Message pt to see if assistance was needed in regard to getting the new study completed.

## 2024-04-03 ENCOUNTER — Ambulatory Visit: Admitting: Physician Assistant

## 2024-04-05 ENCOUNTER — Encounter: Payer: Self-pay | Admitting: Physician Assistant

## 2024-04-08 ENCOUNTER — Ambulatory Visit: Admitting: Pulmonary Disease

## 2024-04-08 ENCOUNTER — Other Ambulatory Visit: Payer: Self-pay

## 2024-04-08 ENCOUNTER — Ambulatory Visit
Admission: RE | Admit: 2024-04-08 | Discharge: 2024-04-08 | Disposition: A | Source: Ambulatory Visit | Attending: Pulmonary Disease | Admitting: Pulmonary Disease

## 2024-04-08 ENCOUNTER — Ambulatory Visit

## 2024-04-08 ENCOUNTER — Encounter: Payer: Self-pay | Admitting: Pulmonary Disease

## 2024-04-08 ENCOUNTER — Ambulatory Visit: Payer: Self-pay | Admitting: Pulmonary Disease

## 2024-04-08 VITALS — BP 122/86 | HR 76 | Temp 97.9°F | Ht 70.0 in | Wt 195.8 lb

## 2024-04-08 DIAGNOSIS — J4489 Other specified chronic obstructive pulmonary disease: Secondary | ICD-10-CM

## 2024-04-08 DIAGNOSIS — E782 Mixed hyperlipidemia: Secondary | ICD-10-CM

## 2024-04-08 DIAGNOSIS — J440 Chronic obstructive pulmonary disease with acute lower respiratory infection: Secondary | ICD-10-CM

## 2024-04-08 DIAGNOSIS — J439 Emphysema, unspecified: Secondary | ICD-10-CM | POA: Insufficient documentation

## 2024-04-08 DIAGNOSIS — R059 Cough, unspecified: Secondary | ICD-10-CM | POA: Diagnosis not present

## 2024-04-08 DIAGNOSIS — R0602 Shortness of breath: Secondary | ICD-10-CM | POA: Diagnosis not present

## 2024-04-08 DIAGNOSIS — J441 Chronic obstructive pulmonary disease with (acute) exacerbation: Secondary | ICD-10-CM

## 2024-04-08 DIAGNOSIS — J449 Chronic obstructive pulmonary disease, unspecified: Secondary | ICD-10-CM | POA: Diagnosis not present

## 2024-04-08 DIAGNOSIS — F17210 Nicotine dependence, cigarettes, uncomplicated: Secondary | ICD-10-CM | POA: Diagnosis not present

## 2024-04-08 LAB — POC COVID19 BINAXNOW: SARS Coronavirus 2 Ag: NEGATIVE

## 2024-04-08 MED ORDER — CEFDINIR 300 MG PO CAPS: 300.0000 mg | ORAL_CAPSULE | Freq: Two times a day (BID) | ORAL | 0 refills | Status: AC

## 2024-04-08 MED ORDER — DOXYCYCLINE HYCLATE 100 MG PO TABS: 100.0000 mg | ORAL_TABLET | Freq: Two times a day (BID) | ORAL | 0 refills | Status: AC

## 2024-04-08 MED ORDER — PREDNISONE 20 MG PO TABS: 20.0000 mg | ORAL_TABLET | Freq: Every day | ORAL | 0 refills | Status: AC

## 2024-04-08 MED ORDER — ATORVASTATIN CALCIUM 20 MG PO TABS: 20.0000 mg | ORAL_TABLET | Freq: Every day | ORAL | 0 refills | Status: AC

## 2024-04-08 NOTE — Progress Notes (Signed)
 Subjective:    Patient ID: Henry Owens, male    DOB: [birth year], 63 y.o.   MRN: [id number]  Patient Care Team: Ostwalt, Janna, PA-C as PCP - General (Physician Assistant) Jacobo Evalene PARAS, MD as Consulting Physician (Oncology)  Chief Complaint  Patient presents with   COPD    Sore throat, cough with green phlegm x 1 week.     BACKGROUND/INTERVAL:Patient is a 63 year old f current smoker (81 pack years) with a history as noted below, who presents for follow-up of cough persistent after COVID-19 infection in March 2025. He has underlying history of COPD.  He initially was seen on 27 July 2023, last visit here was on 14 March 2024.  Minor exacerbation on 29 February 2024 that required evaluation in the ED.  He did not require admission to the hospital.  He presents today as an ACUTE VISIT due to the symptoms above.  HPI Discussed the use of AI scribe software for clinical note transcription with the patient, who gave verbal consent to proceed.  History of Present Illness   Henry Owens is a 63 year old male who presents with sore throat and cough productive of greenish sputum.  This is an ACUTE visit.  He presents accompanied by his wife Henry Owens.  He has been experiencing a sore throat and cough since last week, around Wednesday or Thursday. He describes a feeling of malaise and has been sleeping excessively. During sleep, his breathing sounds 'very bubbly,' as if he is underwater.  He has not performed COVID test.  He smokes half a pack of cigarettes a day. He uses a Trelegy inhaler at a dose of 200 mcg and has not been using his rescue inhaler. He possesses a nebulizer at home but was unaware of its potential use for his symptoms.  He has not received a flu shot this year. He is allergic to penicillin and azithromycin  but has previously taken Omnicef  without issues despite his penicillin allergy.  He has not had fevers, chills or sweats but does endorse  malaise.  No GI symptoms.     DATA 11/27/2023 PFTs: FEV1 2.94 L or 82% predicted, FVC 3.97 L or 84% predicted, FEV1/FVC 74%, lung volumes are normal with only mild air trapping noted.  Diffusion capacity moderately reduced MIP/MEP consistent with possible deconditioning.  Study is consistent with obstructive airways disease, minimal and moderate diffusion defect.  Consistent with COPD.  Review of Systems A 10 point review of systems was performed and it is as noted above otherwise negative.   Patient Active Problem List   Diagnosis Date Noted   Tobacco abuse 01/01/2024   Allergic rhinitis due to allergen 01/01/2024   Thrush 12/24/2023   Other sleep apnea 11/13/2023   Neck pain 11/13/2023   Depression, recurrent 11/13/2023   Syncope and collapse 10/27/2023   B12 deficiency anemia 12/14/2022   Synthetic cannabinoid withdrawal (HCC) 12/13/2022   Cannabinoid hyperemesis syndrome 12/12/2022   Hypokalemia 12/11/2022   Overweight (BMI 25.0-29.9) 12/11/2022   Intractable nausea and vomiting 12/11/2022   CAP (community acquired pneumonia) 12/10/2022   Fall 11/26/2022   COPD with acute exacerbation (HCC) 02/07/2022   Weakness 02/07/2022   Thrombocytopenia 02/07/2022   Essential hypertension 02/06/2022   Hyponatremia 02/06/2022   CKD stage 3a, GFR 45-59 ml/min (HCC) 02/06/2022   Sepsis (HCC) 11/10/2021   AKI (acute kidney injury) 11/10/2021   Acute confusion 11/10/2021   Chronic, continuous use of opioids 11/10/2021   Aspiration pneumonia (HCC) 10/12/2021  Right upper quadrant pain 10/12/2021   Constipation 10/12/2021   Chronic pain 10/12/2021   Elevated troponin 10/12/2021   Acute respiratory failure with hypoxia and hypercapnia (HCC) 10/09/2021   Opiate overdose (HCC)    Unresponsive state    Acute respiratory failure due to COVID-19 (HCC) 07/05/2021   Current smoker 04/01/2021   Abnormal bone marrow examination 05/01/2020    Social History   Tobacco Use   Smoking status:  Every Day    Current packs/day: 1.50    Average packs/day: 2.0 packs/day for 42.9 years (84.9 ttl pk-yrs)    Types: Cigarettes    Start date: 1982    Last attempt to quit: 06/22/2021   Smokeless tobacco: Never  Substance Use Topics   Alcohol use: No    Allergies[1]  Active Medications[2]  Immunization History  Administered Date(s) Administered   PFIZER(Purple Top)SARS-COV-2 Vaccination 02/05/2020, 03/12/2020   PNEUMOCOCCAL CONJUGATE-20 07/25/2021   Pneumococcal Conjugate,unspecified 07/25/2021        Objective:     Vitals:   04/08/24 0836  BP: 122/86  Pulse: 76  Temp: 97.9 F (36.6 C)  Height: 5' 10 (1.778 m)  Weight: 195 lb 12.8 oz (88.8 kg)  SpO2: 96%  TempSrc: Temporal  BMI (Calculated): 28.09     SpO2: 96 %  GENERAL: Awake and alert, ambulatory with assistance of a cane.  No conversational dyspnea. HEAD: Normocephalic, atraumatic.  EYES: Pupils equal, round, reactive to light.  No scleral icterus.  MOUTH: Dentures both, no thrush. NECK: Supple. No thyromegaly. Trachea midline. No JVD.  No adenopathy. PULMONARY: Good air entry bilaterally.  Scattered rhonchi and wheezes throughout. CARDIOVASCULAR: S1 and S2. Regular rate and rhythm.  No rubs, murmurs or gallops heard. ABDOMEN: Benign. MUSCULOSKELETAL: No joint deformity, no clubbing, no edema.  NEUROLOGIC: Gait moderately unsteady, ambulates with a cane.  Appears to have some mild dysarthria.  No overt asymmetry. SKIN: Intact,warm,dry. PSYCH: Flat affect, appropriate otherwise.   Point-of-care COVID testing was negative    Assessment & Plan:     ICD-10-CM   1. COPD with acute lower respiratory infection (HCC)  J44.0 POC COVID-19    DG Chest 2 View    2. COPD with acute exacerbation (HCC)  J44.1 DG Chest 2 View    3. Tobacco dependence due to cigarettes  F17.210       Orders Placed This Encounter  Procedures   DG Chest 2 View    Standing Status:   Future    Number of Occurrences:   1     Expected Date:   04/08/2024    Expiration Date:   04/08/2025    Reason for Exam (SYMPTOM  OR DIAGNOSIS REQUIRED):   Cough,shortness of breath    Preferred imaging location?:   [facility]   POC COVID-19    Meds ordered this encounter  Medications   predniSONE  (DELTASONE ) 20 MG tablet    Sig: Take 1 tablet (20 mg total) by mouth daily with breakfast for 5 days.    Dispense:  5 tablet    Refill:  0   doxycycline  (VIBRA -TABS) 100 MG tablet    Sig: Take 1 tablet (100 mg total) by mouth 2 (two) times daily for 7 days.    Dispense:  14 tablet    Refill:  0   cefdinir  (OMNICEF ) 300 MG capsule    Sig: Take 1 capsule (300 mg total) by mouth 2 (two) times daily for 7 days.    Dispense:  14 capsule  Refill:  0    Pt. has taken Omnicef  before without issue.   Discussion:    COPD with acute lower respiratory infection Acute exacerbation of COPD with lower respiratory infection, presenting with sore throat, cough, malaise, and increased sleepiness. Symptoms began last week. Negative COVID test. Allergic to penicillin and azithromycin . Smoking half a pack of cigarettes daily, which may exacerbate symptoms. Current treatment with Trelegy 200 mcg is effective. No use of rescue inhaler reported. Decision to switch from Levaquin  to doxycycline  and Omnicef  due to potential QT interval prolongation with Seroquel. - Ordered chest x-ray at the main hospital. - Prescribed doxycycline  100 mg BID for 7 days. - Prescribed Omnicef  BID 300 mg for 7 days. - Prescribed prednisone  20 mg daily for 5  days to reduce inflammation. - Advised use of nebulizer instead of rescue inhaler for better medication delivery, especially in the evening before bed. - Advised smoking cessation to improve respiratory health.      Patient has taken Omnicef  before without difficulty or issue.   Smoking/Tobacco Cessation Counseling Zackrey Dyar is a current user of tobacco or nicotine products. He is considering  quitting at this time. Counseling provided today addressed the risks of continued use and the benefits of cessation. Discussed tobacco/nicotine use history, readiness to quit, and evidence-based treatment options including behavioral strategies, support resources, and pharmacologic therapies. Provided encouragement and educational materials on steps and resources to quit smoking. Patient questions were addressed, and follow-up recommended for continued support. Total time spent on counseling: 5 minutes.     Follow-up in 3 weeks time.   Advised if symptoms do not improve or worsen, to please contact office for sooner follow up or seek emergency care.    I spent 40 minutes of dedicated to the care of this patient on the date of this encounter to include pre-visit review of records, face-to-face time with the patient discussing conditions above, post visit ordering of testing, clinical documentation with the electronic health record, making appropriate referrals as documented, and communicating necessary findings to members of the patients care team.     C. Leita Sanders, MD Advanced Bronchoscopy PCCM [facility]    *This note was generated using voice recognition software/Dragon and/or AI transcription program.  Despite best efforts to proofread, errors can occur which can change the meaning. Any transcriptional errors that result from this process are unintentional and may not be fully corrected at the time of dictation.    [1]  Allergies Allergen Reactions   Wound Dressing Adhesive Other (See Comments)    BLISTER   Azithromycin  Rash and Dermatitis   Penicillin G Rash    Has patient had a PCN reaction causing immediate rash, facial/tongue/throat swelling, SOB or lightheadedness with hypotension: Yes Has patient had a PCN reaction causing severe rash involving mucus membranes or skin necrosis: No Has patient had a PCN reaction that required hospitalization: No Has  patient had a PCN reaction occurring within the last 10 years: No If all of the above answers are NO, then may proceed with Cephalosporin use.   [2]  Current Meds  Medication Sig   acetaminophen  (TYLENOL ) 500 MG tablet Take 1,000 mg by mouth in the morning, at noon, and at bedtime.   albuterol  (PROVENTIL ) (2.5 MG/3ML) 0.083% nebulizer solution Take 3 mLs (2.5 mg total) by nebulization every 4 (four) hours as needed for wheezing or shortness of breath.   albuterol  (VENTOLIN  HFA) 108 (90 Base) MCG/ACT inhaler Inhale 2 puffs into the lungs every  4 (four) hours as needed for wheezing or shortness of breath.   AMBULATORY NON FORMULARY MEDICATION Trimix (30/1/10)-(Pap/Phent/PGE)  Test Dose  1ml vial   Qty #3 Refills 0  [facility] [phone] Fax [phone]   aspirin  EC 81 MG tablet Take 81 mg by mouth daily. Swallow whole.   atenolol  (TENORMIN ) 25 MG tablet Take 1 tablet (25 mg total) by mouth 2 (two) times daily.   atorvastatin  (LIPITOR) 20 MG tablet Take 1 tablet (20 mg total) by mouth daily.   cefdinir  (OMNICEF ) 300 MG capsule Take 1 capsule (300 mg total) by mouth 2 (two) times daily for 7 days.   Cholecalciferol (D 1000) 25 MCG (1000 UT) capsule Take 1,000 Units by mouth daily.   cyanocobalamin  1000 MCG tablet Take 1 tablet (1,000 mcg total) by mouth daily.   divalproex  (DEPAKOTE ) 250 MG DR tablet Take 1 tablet (250 mg total) by mouth 2 (two) times daily.   doxycycline  (VIBRA -TABS) 100 MG tablet Take 1 tablet (100 mg total) by mouth 2 (two) times daily for 7 days.   fenofibrate  (TRICOR ) 48 MG tablet Take 1 tablet (48 mg total) by mouth daily.   fluticasone  (FLONASE ) 50 MCG/ACT nasal spray Place 1 spray into both nostrils daily as needed for allergies or rhinitis.   Fluticasone -Umeclidin-Vilant (TRELEGY ELLIPTA ) 200-62.5-25 MCG/ACT AEPB Inhale 1 Inhaler into the lungs as directed.   Fluticasone -Umeclidin-Vilant (TRELEGY ELLIPTA ) 200-62.5-25 MCG/ACT AEPB Inhale 1 puff into  the lungs daily.   gabapentin  (NEURONTIN ) 300 MG capsule Take 3 capsules (900 mg total) by mouth 2 (two) times daily.   magic mouthwash (nystatin , lidocaine , diphenhydrAMINE , alum & mag hydroxide) suspension Swish and spit 5 mLs 4 (four) times daily.   nortriptyline (PAMELOR) 10 MG capsule Take 10 mg by mouth daily.   OXYGEN Inhale into the lungs. As needed Has not used in a year   pantoprazole  (PROTONIX ) 40 MG tablet TAKE 1 TABLET(40 MG) BY MOUTH DAILY   predniSONE  (DELTASONE ) 20 MG tablet Take 1 tablet (20 mg total) by mouth daily with breakfast for 5 days.   QUEtiapine (SEROQUEL) 25 MG tablet Take 75 mg by mouth at bedtime.   Spacer/Aero-Holding Raguel FRENCH Use as directed for inhaler use   tiZANidine  (ZANAFLEX ) 4 MG tablet Take 4 mg by mouth 3 (three) times daily.   valsartan  (DIOVAN ) 40 MG tablet Take 1 tablet (40 mg total) by mouth daily. (Patient taking differently: Take 20 mg by mouth daily.)   venlafaxine  XR (EFFEXOR  XR) 75 MG 24 hr capsule Take 1 capsule (75 mg total) by mouth daily with breakfast.

## 2024-04-08 NOTE — Patient Instructions (Signed)
 VISIT SUMMARY:  You came in today because you have been experiencing a sore throat and cough since last week. You also mentioned feeling very tired and having a bubbly sound when you breathe during sleep. Your COVID test was negative.  YOUR PLAN:  -COPD WITH ACUTE LOWER RESPIRATORY INFECTION: You have an acute exacerbation of COPD, which means your chronic lung condition has worsened due to a lower respiratory infection. This is causing your sore throat, cough, and increased tiredness. We have prescribed doxycycline  and Omnicef , both antibiotics, to treat the infection. You will also take prednisone  for a few days to reduce inflammation. We recommend using your nebulizer instead of your rescue inhaler, especially before bed, to help with your breathing. It's important to stop smoking to improve your lung health.  INSTRUCTIONS:  Please get a chest x-ray at the main hospital as soon as possible. Follow the medication instructions carefully: take doxycycline  100 mg twice a day for 7 days, Omnicef  twice a day for 7 days, and prednisone  as directed. Use your nebulizer in the evening before bed. Consider quitting smoking to help your respiratory health.

## 2024-04-08 NOTE — Telephone Encounter (Unsigned)
 Copied from CRM #[id number]. Topic: Clinical - Prescription Issue >> Apr 08, 2024  2:48 PM Sophia H wrote: Reason for CRM: Spoke with daughter Emmalene who states patient had been in between pharmacies and had the RX for atorvastatin  (LIPITOR) 20 MG tablet transferred then decided to go back to original pharmacy and was told new RX is needed. Please send over updated RX.  [id number] - [place], [place] - [address]

## 2024-04-08 NOTE — Telephone Encounter (Signed)
Med has been sent in

## 2024-04-10 DIAGNOSIS — R55 Syncope and collapse: Secondary | ICD-10-CM | POA: Diagnosis not present

## 2024-04-10 DIAGNOSIS — R404 Transient alteration of awareness: Secondary | ICD-10-CM | POA: Diagnosis not present

## 2024-04-10 DIAGNOSIS — I10 Essential (primary) hypertension: Secondary | ICD-10-CM | POA: Diagnosis not present

## 2024-04-10 DIAGNOSIS — Z8673 Personal history of transient ischemic attack (TIA), and cerebral infarction without residual deficits: Secondary | ICD-10-CM | POA: Diagnosis not present

## 2024-04-13 NOTE — Progress Notes (Unsigned)
 " Established patient visit  Patient: Henry Owens   DOB: 03-31-1961   63 y.o. Male  MRN: [id number] Visit Date: 04/14/2024  Today's healthcare provider: Jolynn Spencer, PA-C   No chief complaint on file.  Subjective       Discussed the use of AI scribe software for clinical note transcription with the patient, who gave verbal consent to proceed.  History of Present Illness    Gfr 47 and low hemoglobin 12    12/21/2023    1:32 PM 11/12/2023    1:25 PM 06/29/2023    1:09 PM  Depression screen PHQ 2/9  Decreased Interest 2 2 2   Down, Depressed, Hopeless 2 2 2   PHQ - 2 Score 4 4 4   Altered sleeping 2 1 3   Tired, decreased energy 2 1 3   Change in appetite 2 0 0  Feeling bad or failure about yourself  2 2 2   Trouble concentrating 2 0 2  Moving slowly or fidgety/restless 1 0 0  Suicidal thoughts 0 0 0  PHQ-9 Score 15  8  14    Difficult doing work/chores Very difficult Somewhat difficult Very difficult     Data saved with a previous flowsheet row definition      12/21/2023    1:33 PM 11/12/2023    1:26 PM 06/29/2023    1:09 PM 06/01/2023    1:55 PM  GAD 7 : Generalized Anxiety Score  Nervous, Anxious, on Edge 2 2 3 3   Control/stop worrying 2 2 3 3   Worry too much - different things 2 2 3 3   Trouble relaxing 2 2 3 3   Restless 2 1 0 0  Easily annoyed or irritable 2 1 0 0  Afraid - awful might happen 2 1 0 3  Total GAD 7 Score 14 11 12 15   Anxiety Difficulty Very difficult Not difficult at all Very difficult Very difficult    Medications: Show/hide medication list[1]  Review of Systems  All other systems reviewed and are negative.  All negative Except see HPI   {Insert previous labs (optional):23779} {See past labs  Heme  Chem  Endocrine  Serology  Results Review (optional):1}   Objective    There were no vitals taken for this visit. {Insert last BP/Wt (optional):23777}{See vitals history (optional):1}   Physical Exam Vitals reviewed.   Constitutional:      General: He is not in acute distress.    Appearance: Normal appearance. He is not diaphoretic.  HENT:     Head: Normocephalic and atraumatic.  Eyes:     General: No scleral icterus.    Conjunctiva/sclera: Conjunctivae normal.  Cardiovascular:     Rate and Rhythm: Normal rate and regular rhythm.     Pulses: Normal pulses.     Heart sounds: Normal heart sounds. No murmur heard. Pulmonary:     Effort: Pulmonary effort is normal. No respiratory distress.     Breath sounds: Normal breath sounds. No wheezing or rhonchi.  Musculoskeletal:     Cervical back: Neck supple.     Right lower leg: No edema.     Left lower leg: No edema.  Lymphadenopathy:     Cervical: No cervical adenopathy.  Skin:    General: Skin is warm and dry.     Findings: No rash.  Neurological:     Mental Status: He is alert and oriented to person, place, and time. Mental status is at baseline.  Psychiatric:        Mood and Affect:  Mood normal.        Behavior: Behavior normal.     No results found for any visits on 04/14/24.      Assessment and Plan Assessment & Plan     No orders of the defined types were placed in this encounter.   No follow-ups on file.   The patient was advised to call back or seek an in-person evaluation if the symptoms worsen or if the condition fails to improve as anticipated.  I discussed the assessment and treatment plan with the patient. The patient was provided an opportunity to ask questions and all were answered. The patient agreed with the plan and demonstrated an understanding of the instructions.  I, Juquan Reznick, PA-C have reviewed all documentation for this visit. The documentation on 04/14/2024  for the exam, diagnosis, procedures, and orders are all accurate and complete.  Jolynn Spencer, [facility], MMS [facility] [phone] (phone) [phone] (fax)  [facility]     [1] Outpatient Medications Prior to Visit   Medication Sig   acetaminophen  (TYLENOL ) 500 MG tablet Take 1,000 mg by mouth in the morning, at noon, and at bedtime.   albuterol  (PROVENTIL ) (2.5 MG/3ML) 0.083% nebulizer solution Take 3 mLs (2.5 mg total) by nebulization every 4 (four) hours as needed for wheezing or shortness of breath.   albuterol  (VENTOLIN  HFA) 108 (90 Base) MCG/ACT inhaler Inhale 2 puffs into the lungs every 4 (four) hours as needed for wheezing or shortness of breath.   AMBULATORY NON FORMULARY MEDICATION Trimix (30/1/10)-(Pap/Phent/PGE)  Test Dose  1ml vial   Qty #3 Refills 0  [facility] [phone] Fax [phone]   aspirin  EC 81 MG tablet Take 81 mg by mouth daily. Swallow whole.   atenolol  (TENORMIN ) 25 MG tablet Take 1 tablet (25 mg total) by mouth 2 (two) times daily.   atorvastatin  (LIPITOR) 20 MG tablet Take 1 tablet (20 mg total) by mouth daily.   cefdinir  (OMNICEF ) 300 MG capsule Take 1 capsule (300 mg total) by mouth 2 (two) times daily for 7 days.   Cholecalciferol (D 1000) 25 MCG (1000 UT) capsule Take 1,000 Units by mouth daily.   cyanocobalamin  1000 MCG tablet Take 1 tablet (1,000 mcg total) by mouth daily.   divalproex  (DEPAKOTE ) 250 MG DR tablet Take 1 tablet (250 mg total) by mouth 2 (two) times daily.   doxycycline  (VIBRA -TABS) 100 MG tablet Take 1 tablet (100 mg total) by mouth 2 (two) times daily for 7 days.   fenofibrate  (TRICOR ) 48 MG tablet Take 1 tablet (48 mg total) by mouth daily.   fluticasone  (FLONASE ) 50 MCG/ACT nasal spray Place 1 spray into both nostrils daily as needed for allergies or rhinitis.   Fluticasone -Umeclidin-Vilant (TRELEGY ELLIPTA ) 200-62.5-25 MCG/ACT AEPB Inhale 1 Inhaler into the lungs as directed.   Fluticasone -Umeclidin-Vilant (TRELEGY ELLIPTA ) 200-62.5-25 MCG/ACT AEPB Inhale 1 puff into the lungs daily.   gabapentin  (NEURONTIN ) 300 MG capsule Take 3 capsules (900 mg total) by mouth 2 (two) times daily.   magic mouthwash (nystatin ,  lidocaine , diphenhydrAMINE , alum & mag hydroxide) suspension Swish and spit 5 mLs 4 (four) times daily.   nortriptyline (PAMELOR) 10 MG capsule Take 10 mg by mouth daily.   OXYGEN Inhale into the lungs. As needed Has not used in a year   pantoprazole  (PROTONIX ) 40 MG tablet TAKE 1 TABLET(40 MG) BY MOUTH DAILY   predniSONE  (DELTASONE ) 20 MG tablet Take 1 tablet (20 mg total) by mouth daily with breakfast for 5  days.   QUEtiapine (SEROQUEL) 25 MG tablet Take 75 mg by mouth at bedtime.   Spacer/Aero-Holding Raguel FRENCH Use as directed for inhaler use   tiZANidine  (ZANAFLEX ) 4 MG tablet Take 4 mg by mouth 3 (three) times daily.   valsartan  (DIOVAN ) 40 MG tablet Take 1 tablet (40 mg total) by mouth daily. (Patient taking differently: Take 20 mg by mouth daily.)   venlafaxine  XR (EFFEXOR  XR) 75 MG 24 hr capsule Take 1 capsule (75 mg total) by mouth daily with breakfast.   No facility-administered medications prior to visit.  "

## 2024-04-14 ENCOUNTER — Encounter: Payer: Self-pay | Admitting: Physician Assistant

## 2024-04-14 ENCOUNTER — Ambulatory Visit: Admitting: Physician Assistant

## 2024-04-14 VITALS — BP 151/85 | HR 77 | Ht 70.0 in | Wt 195.6 lb

## 2024-04-14 DIAGNOSIS — N1831 Chronic kidney disease, stage 3a: Secondary | ICD-10-CM | POA: Diagnosis not present

## 2024-04-14 DIAGNOSIS — F419 Anxiety disorder, unspecified: Secondary | ICD-10-CM

## 2024-04-14 DIAGNOSIS — G4739 Other sleep apnea: Secondary | ICD-10-CM | POA: Diagnosis not present

## 2024-04-14 DIAGNOSIS — J449 Chronic obstructive pulmonary disease, unspecified: Secondary | ICD-10-CM

## 2024-04-14 DIAGNOSIS — Z720 Tobacco use: Secondary | ICD-10-CM | POA: Diagnosis not present

## 2024-04-14 DIAGNOSIS — F32A Depression, unspecified: Secondary | ICD-10-CM

## 2024-04-14 DIAGNOSIS — Z79899 Other long term (current) drug therapy: Secondary | ICD-10-CM

## 2024-04-14 DIAGNOSIS — J441 Chronic obstructive pulmonary disease with (acute) exacerbation: Secondary | ICD-10-CM

## 2024-04-14 DIAGNOSIS — I10 Essential (primary) hypertension: Secondary | ICD-10-CM | POA: Diagnosis not present

## 2024-04-14 DIAGNOSIS — M25561 Pain in right knee: Secondary | ICD-10-CM

## 2024-04-14 DIAGNOSIS — E782 Mixed hyperlipidemia: Secondary | ICD-10-CM | POA: Diagnosis not present

## 2024-04-16 ENCOUNTER — Ambulatory Visit (INDEPENDENT_AMBULATORY_CARE_PROVIDER_SITE_OTHER)

## 2024-04-16 VITALS — BP 146/81 | HR 82

## 2024-04-16 DIAGNOSIS — I10 Essential (primary) hypertension: Secondary | ICD-10-CM

## 2024-04-16 MED ORDER — BLOOD PRESSURE MONITOR AUTOMAT DEVI: 0 refills | Status: AC

## 2024-04-16 NOTE — Progress Notes (Signed)
 "  S:     Reason for visit: ? 63 y.o. male who presents for hypertension evaluation, education, and management. 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  They were referred to the pharmacist by their PCP for assistance in managing hypertension and complex medication management.  Care Team: Primary Care Provider: Ostwalt, Janna, PA-C  At last visit with cardiology on 04/10/24, patient reported dizziness upon standing in the evening with some soft BP readings at that time. Patient was instructed to decrease atenolol  to 25 mg a day. Of note, he has had a history of loss of conscious and unresponsiveness in the past.   Today, patient arrives in good spirits and presents with a cane with his wife Denies dizziness, headache, blurred vision, swelling.   Patient reports an improvement in evening dizziness and resolution of soft BP readings since atenolol  was reduced to once daily.    Current antihypertensives include: atenolol  25 mg daily, valsartan  20 mg daily   Patient reports adherence to taking all medications as prescribed.   SMBP: BP cuff at home: yes    Reported home blood pressure readings: 140/80 mmHg (did not bring long)  BP reading in office today on home wrist cuff was 210/105 mmHg immediately following office reading  BP medications taken today: yes    _______________________________________________  Objective    Physical Examination:  Vitals:  Wt Readings from Last 3 Encounters:  04/14/24 195 lb 9.6 oz (88.7 kg)  04/08/24 195 lb 12.8 oz (88.8 kg)  03/25/24 200 lb 1.6 oz (90.8 kg)   BP Readings from Last 3 Encounters:  04/14/24 (!) 151/85  04/08/24 122/86  03/25/24 (!) 171/92   Pulse Readings from Last 3 Encounters:  04/14/24 77  04/08/24 76  03/25/24 63     Labs:?     Chemistry   Metabolic Risk Considerations  Lab Results  Component Value Date   NA 135 02/29/2024   K 4.3  02/29/2024   CL 98 02/29/2024   CO2 26 02/29/2024   BUN 19 02/29/2024   CREATININE 1.64 (H) 02/29/2024   CALCIUM  9.0 02/29/2024   MG 2.2 12/14/2022   PHOS 3.0 10/12/2021   Lab Results  Component Value Date   HGBA1C 5.9 (H) 05/28/2023   GLUCOSE 96 02/29/2024   CREATININE 1.64 (H) 02/29/2024   CREATININE 1.42 (H) 02/28/2024   CREATININE 1.39 (H) 02/06/2024   GFRNONAA 47 (L) 02/29/2024   GFRNONAA 56 (L) 02/28/2024   GFRNONAA 57 (L) 02/06/2024    Lab Results  Component Value Date   CHOL 213 (H) 08/03/2023   LDLDIRECT 55 11/28/2022   HDL 24 (L) 08/03/2023   HDL 22 (L) 05/28/2023   AST 16 02/29/2024   AST 15 02/06/2024   ALT 19 02/29/2024   ALT 16 02/06/2024     The ASCVD Risk score (Arnett DK, et al., 2019) failed to calculate for the following reasons:   Risk score cannot be calculated because patient has a medical history suggesting prior/existing ASCVD   * - Cholesterol units were assumed  Assessment and Plan:   Hypertension diagnosis currently elevated in clinic. BP goal < 130/80 mmHg. Medication adherence appears appropriate. Patient has had a history of soft BP readings/dizziness in the evening, but this has improved with a dose decrease of atenolol  at last cardiology visit. Home BP readings over the past 2 weeks have been ~140/80 mmHg, however, home wrist cuff was ~  70 mmHg systolic higher (789/894 mmHg) today immediately after office reading. I worry that his reported home readings are likely significantly lower than reported and likely well-controlled. Will send prescription for BP cuff to DME company. -Continued valsartan  20 mg daily.  -Continued atenolol  25 mg daily.  -Counseled on lifestyle modifications for blood pressure control including reduced dietary sodium, increased exercise, adequate sleep. -Encouraged patient to check BP at home and bring log of readings to next visit. Counseled on proper use of home BP cuff.  -Sent prescription for BP cuff to DME  company  Patient verbalized understanding of treatment plan. Total time patient counseling 30 minutes.  Follow-up:  Pharmacist PRN PCP clinic visit on 05/01/24  Peyton CHARLENA Ferries, PharmD Clinical Pharmacist [facility] [phone]  "

## 2024-04-16 NOTE — Patient Instructions (Addendum)
 Thanks for visiting with me today!  Please visit [email or web address] to look for a new blood pressure cuff!  Have a great holiday season!  Shenee Wignall E. Marsh, PharmD, CPP Clinical Pharmacist [facility] [phone]

## 2024-04-18 ENCOUNTER — Other Ambulatory Visit: Payer: Self-pay | Admitting: Physician Assistant

## 2024-04-21 ENCOUNTER — Ambulatory Visit: Admitting: Physician Assistant

## 2024-04-25 ENCOUNTER — Encounter: Payer: Self-pay | Admitting: Diagnostic Neuroimaging

## 2024-05-01 ENCOUNTER — Ambulatory Visit: Admitting: Physician Assistant

## 2024-05-01 ENCOUNTER — Ambulatory Visit: Admitting: Pulmonary Disease

## 2024-05-05 ENCOUNTER — Encounter: Payer: Self-pay | Admitting: Physician Assistant

## 2024-05-05 ENCOUNTER — Ambulatory Visit: Admitting: Physician Assistant

## 2024-05-05 VITALS — BP 149/84 | HR 68 | Resp 14 | Ht 70.0 in | Wt 195.4 lb

## 2024-05-05 DIAGNOSIS — Z720 Tobacco use: Secondary | ICD-10-CM | POA: Diagnosis not present

## 2024-05-05 DIAGNOSIS — Z7409 Other reduced mobility: Secondary | ICD-10-CM | POA: Diagnosis not present

## 2024-05-05 DIAGNOSIS — I10 Essential (primary) hypertension: Secondary | ICD-10-CM

## 2024-05-05 DIAGNOSIS — E876 Hypokalemia: Secondary | ICD-10-CM

## 2024-05-05 DIAGNOSIS — G4739 Other sleep apnea: Secondary | ICD-10-CM

## 2024-05-05 DIAGNOSIS — Z23 Encounter for immunization: Secondary | ICD-10-CM

## 2024-05-05 DIAGNOSIS — E782 Mixed hyperlipidemia: Secondary | ICD-10-CM | POA: Diagnosis not present

## 2024-05-05 DIAGNOSIS — N1831 Chronic kidney disease, stage 3a: Secondary | ICD-10-CM | POA: Diagnosis not present

## 2024-05-05 DIAGNOSIS — D696 Thrombocytopenia, unspecified: Secondary | ICD-10-CM

## 2024-05-05 DIAGNOSIS — D513 Other dietary vitamin B12 deficiency anemia: Secondary | ICD-10-CM

## 2024-05-05 NOTE — Progress Notes (Unsigned)
 Patient is in office today for a nurse visit for Immunization. Patient Injection was given in the  Left deltoid. Patient tolerated injection well.

## 2024-05-06 DIAGNOSIS — E782 Mixed hyperlipidemia: Secondary | ICD-10-CM | POA: Insufficient documentation

## 2024-05-06 DIAGNOSIS — Z7409 Other reduced mobility: Secondary | ICD-10-CM | POA: Insufficient documentation

## 2024-05-06 NOTE — Progress Notes (Signed)
 " Established patient visit  Patient: Henry Owens   DOB: [birth year]   64 y.o. Male  MRN: [id number] Visit Date: 05/05/2024  Today's healthcare provider: Jolynn Spencer, PA-C   Chief Complaint  Patient presents with   Medical Management of Chronic Issues    3 month f/u   Subjective     HPI     Medical Management of Chronic Issues    Additional comments: 3 month f/u      Last edited by Wilfred Hargis RAMAN, CMA on 05/05/2024  2:00 PM.       Discussed the use of AI scribe software for clinical note transcription with the patient, who gave verbal consent to proceed.  History of Present Illness Henry Owens is a 64 year old male with hypertension and anxiety who presents with concerns about blood pressure management and recent falls.  His blood pressure has improved from 159 mmHg to 149 mmHg. He is taking atenolol  25 mg once daily and valsartan  20 mg once daily. He notes low water  intake.  He has ongoing depression and anxiety while on Effexor  and may need a medication adjustment.  He had a fall last Monday night after feeling his legs give out with tunnel vision and shakiness. He had not eaten all day and is concerned about low blood sugar. He has chronic neck and lower back pain that has been constant for years and is very sedentary.  He takes cholesterol medication and fenofibrate . He denies shortness of breath, chest pain, rapid heart rate, vision problems, leg swelling, or bowel changes. He has not seen an eye doctor in years but reports no current vision issues.       04/14/2024    1:09 PM 12/21/2023    1:32 PM 11/12/2023    1:25 PM  Depression screen PHQ 2/9  Decreased Interest 2 2 2   Down, Depressed, Hopeless 2 2 2   PHQ - 2 Score 4 4 4   Altered sleeping 2 2 1   Tired, decreased energy 1 2 1   Change in appetite 1 2 0  Feeling bad or failure about yourself  2 2 2   Trouble concentrating 1 2 0  Moving slowly or fidgety/restless 1 1 0  Suicidal  thoughts 0 0 0  PHQ-9 Score 12 15  8    Difficult doing work/chores Somewhat difficult Very difficult Somewhat difficult     Data saved with a previous flowsheet row definition      04/14/2024    1:09 PM 12/21/2023    1:33 PM 11/12/2023    1:26 PM 06/29/2023    1:09 PM  GAD 7 : Generalized Anxiety Score  Nervous, Anxious, on Edge 1 2 2 3   Control/stop worrying 1 2 2 3   Worry too much - different things 1 2 2 3   Trouble relaxing 1 2 2 3   Restless 0 2 1 0  Easily annoyed or irritable 1 2 1  0  Afraid - awful might happen 1 2 1  0  Total GAD 7 Score 6 14 11 12   Anxiety Difficulty Somewhat difficult Very difficult Not difficult at all Very difficult    Medications: Show/hide medication list[1]  Review of Systems All negative Except see HPI       Objective    BP (!) 149/84   Pulse 68   Resp 14   Ht 5' 10 (1.778 m)   Wt 195 lb 6.4 oz (88.6 kg)   SpO2 100%   BMI 28.04 kg/m  Physical Exam Vitals and nursing note reviewed.  Constitutional:      General: He is not in acute distress.    Appearance: Normal appearance. He is not diaphoretic.  HENT:     Head: Normocephalic and atraumatic.  Eyes:     General: No scleral icterus.    Conjunctiva/sclera: Conjunctivae normal.  Cardiovascular:     Rate and Rhythm: Normal rate and regular rhythm.     Pulses: Normal pulses.     Heart sounds: Normal heart sounds. No murmur heard. Pulmonary:     Effort: Pulmonary effort is normal. No respiratory distress.     Breath sounds: Normal breath sounds. No wheezing or rhonchi.  Musculoskeletal:     Cervical back: Neck supple.     Right lower leg: No edema.     Left lower leg: No edema.  Lymphadenopathy:     Cervical: No cervical adenopathy.  Skin:    General: Skin is warm and dry.     Findings: No rash.  Neurological:     Mental Status: He is alert and oriented to person, place, and time. Mental status is at baseline.  Psychiatric:        Mood and Affect: Mood normal.         Behavior: Behavior normal.      No results found for any visits on 05/05/24.      Assessment & Plan Essential hypertension Chronic and unstable  blood pressure improved but remains elevated at 149 mmHg. Current regimen includes atenolol  25 mg daily and valsartan  20 mg daily. Target is 130/80 mmHg. - Increased valsartan  to 40 mg daily. - Continue atenolol  25 mg daily. - Monitor blood pressure regularly. Adjust medication if blood pressure stays below 130/80 and drops close to 90/60 Will follow-up  Chronic kidney disease, stage 3a Emphasized maintaining hydration to prevent kidney function decline. - Encouraged increased water  intake. Will recheck renal functions  Mobility impairment with recurrent falls Recent fall with tunnel vision and shakiness, possibly due to hypoglycemia or medication side effects. Discussed fall prevention and protective measures. - Referred to physical therapy for mobility and fall prevention. - Advised use of a cane for stability. - Recommended appropriate fall prevention/measures at home.  Depression and anxiety Chronic and stable  current management includes Effexor . Appointment scheduled for medication adjustment. Discussed medication management and interactions. - Attend scheduled appointment for medication adjustment. - Consult with clinic pharmacist for medication interaction review.  Hyperlipidemia Chronic  continued management with cholesterol medication and fenofibrate . Discussed importance of controlling cholesterol levels. - Continue current cholesterol medication and fenofibrate  regimen. Will recheck lipids level at a follow-up  General health maintenance Discussed importance of regular exercise and healthy lifestyle. - Encouraged regular exercise and physical activity. - Advised on use of sunglasses for eye protection.  Tobacco use Chronic Cessation encourage Will revisit   Need for tetanus booster  - Tdap vaccine greater  than or equal to 7yo IM  Mobility impaired  - Ambulatory referral to Physical Therapy   Orders Placed This Encounter  Procedures   Tdap vaccine greater than or equal to 7yo IM   Ambulatory referral to Physical Therapy    Referral Priority:   Routine    Referral Type:   Physical Medicine    Referral Reason:   Specialty Services Required    Requested Specialty:   Physical Therapy    Number of Visits Requested:   1    Return for chronic disease f/u in 3-4 mo.   The  patient was advised to call back or seek an in-person evaluation if the symptoms worsen or if the condition fails to improve as anticipated.  I discussed the assessment and treatment plan with the patient. The patient was provided an opportunity to ask questions and all were answered. The patient agreed with the plan and demonstrated an understanding of the instructions.  I, Yalitza Teed, PA-C have reviewed all documentation for this visit. The documentation on 05/05/2024  for the exam, diagnosis, procedures, and orders are all accurate and complete.  Jolynn Spencer, [facility], MMS [facility] [phone] (phone) [phone] (fax)  [facility]    [1]  Outpatient Medications Prior to Visit  Medication Sig   acetaminophen  (TYLENOL ) 500 MG tablet Take 1,000 mg by mouth in the morning, at noon, and at bedtime.   albuterol  (PROVENTIL ) (2.5 MG/3ML) 0.083% nebulizer solution Take 3 mLs (2.5 mg total) by nebulization every 4 (four) hours as needed for wheezing or shortness of breath.   albuterol  (VENTOLIN  HFA) 108 (90 Base) MCG/ACT inhaler Inhale 2 puffs into the lungs every 4 (four) hours as needed for wheezing or shortness of breath.   aspirin  EC 81 MG tablet Take 81 mg by mouth daily. Swallow whole.   atenolol  (TENORMIN ) 25 MG tablet Take 25 mg by mouth daily.   atorvastatin  (LIPITOR) 20 MG tablet Take 1 tablet (20 mg total) by mouth daily.   Blood Pressure Monitoring (BLOOD PRESSURE MONITOR  AUTOMAT) DEVI Use to monitor blood pressure once daily   cetirizine (ZYRTEC) 10 MG chewable tablet Chew 10 mg by mouth daily.   Cholecalciferol (D 1000) 25 MCG (1000 UT) capsule Take 1,000 Units by mouth daily.   Coenzyme Q10 (COQ-10) 400 MG CAPS Take 1 tablet by mouth daily.   cyanocobalamin  1000 MCG tablet Take 1 tablet (1,000 mcg total) by mouth daily.   divalproex  (DEPAKOTE ) 250 MG DR tablet Take 1 tablet (250 mg total) by mouth 2 (two) times daily.   fenofibrate  (TRICOR ) 48 MG tablet Take 1 tablet (48 mg total) by mouth daily.   fluticasone  (FLONASE ) 50 MCG/ACT nasal spray Place 1 spray into both nostrils daily as needed for allergies or rhinitis.   Fluticasone -Umeclidin-Vilant (TRELEGY ELLIPTA ) 200-62.5-25 MCG/ACT AEPB Inhale 1 puff into the lungs daily.   gabapentin  (NEURONTIN ) 300 MG capsule Take 3 capsules (900 mg total) by mouth 2 (two) times daily. (Patient taking differently: Take 300 mg by mouth 2 (two) times daily.)   magic mouthwash (nystatin , lidocaine , diphenhydrAMINE , alum & mag hydroxide) suspension Swish and spit 5 mLs 4 (four) times daily.   nortriptyline (PAMELOR) 10 MG capsule Take 10 mg by mouth daily.   OXYGEN Inhale into the lungs. As needed Has not used in a year   pantoprazole  (PROTONIX ) 40 MG tablet TAKE 1 TABLET BY MOUTH EVERY DAY   QUEtiapine (SEROQUEL) 25 MG tablet Take 75 mg by mouth at bedtime.   Spacer/Aero-Holding Raguel FRENCH Use as directed for inhaler use   tiZANidine  (ZANAFLEX ) 4 MG tablet Take 4 mg by mouth 3 (three) times daily.   valsartan  (DIOVAN ) 40 MG tablet Take 20 mg by mouth daily.   venlafaxine  XR (EFFEXOR  XR) 75 MG 24 hr capsule Take 1 capsule (75 mg total) by mouth daily with breakfast.   [DISCONTINUED] valsartan  (DIOVAN ) 40 MG tablet Take 1 tablet (40 mg total) by mouth daily. (Patient taking differently: Take 20 mg by mouth daily.)   [DISCONTINUED] AMBULATORY NON FORMULARY MEDICATION Trimix (30/1/10)-(Pap/Phent/PGE)  Test Dose  1ml  vial    Qty #3 Refills 0  [facility] [phone] Fax [phone] (Patient not taking: Reported on 04/16/2024)   No facility-administered medications prior to visit.   "

## 2024-05-12 ENCOUNTER — Encounter: Payer: Self-pay | Admitting: Diagnostic Neuroimaging

## 2024-05-13 ENCOUNTER — Encounter: Payer: Self-pay | Admitting: Physician Assistant

## 2024-05-13 ENCOUNTER — Other Ambulatory Visit: Payer: Self-pay | Admitting: Physician Assistant

## 2024-05-13 ENCOUNTER — Telehealth: Payer: Self-pay | Admitting: Diagnostic Neuroimaging

## 2024-05-13 NOTE — Telephone Encounter (Signed)
 Patient asking Dr. Margaret for refills for tiZANidine  (ZANAFLEX ) 4 MG tablet and  nortriptyline (PAMELOR) 10 MG capsule. Patient stated no longer seeing neurologist previously prescribed. Can send to [facility] #[id number]

## 2024-05-13 NOTE — Telephone Encounter (Signed)
 Please advise on taking over care for listed medications. Patient is no longer seeing prescribing neurologist.    QUETIAPINE 25MG  TABLETS filled on 02/06/2024 by Lane Arthea BRAVO, MD 90 day supply   TIZANIDINE  4MG  TABLETS filled on 04/21/2024 by Lane Arthea BRAVO, MD 30 day supply   NORTRIPTYLINE 10MG  CAPSULES filled on 04/08/2024 by Lane Arthea BRAVO, MD 30 day supply    Patient was last seen by Dr. Margaret for recurrent syncope on 03/25/24. Patient's next appointment is on 06/09/24 with Dr. 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

## 2024-05-14 ENCOUNTER — Other Ambulatory Visit: Payer: Self-pay | Admitting: Physician Assistant

## 2024-05-14 NOTE — Telephone Encounter (Signed)
 Copied from CRM #[id number]. Topic: Clinical - Medication Refill >> May 14, 2024 11:52 AM Nathanel BROCKS wrote: Medication: QUEtiapine (SEROQUEL) 25 MG tablet nortriptyline (PAMELOR) 10 MG capsule  Pts dr is no longer writing this rx as he changed nerologist  Has the patient contacted their pharmacy? No   This is the patient's preferred pharmacy:  [facility] #[id number] [place], [place] - [address] AT [facility] OF [address] [place] [address] Phone: [phone] Fax: [phone]  Is this the correct pharmacy for this prescription? Yes If no, delete pharmacy and type the correct one.   Has the prescription been filled recently? Yes  Is the patient out of the medication? Yes  Has the patient been seen for an appointment in the last year OR does the patient have an upcoming appointment? Yes  Can we respond through MyChart? yes  Agent: Please be advised that Rx refills may take up to 3 business days. We ask that you follow-up with your pharmacy.

## 2024-05-14 NOTE — Telephone Encounter (Signed)
 Requested medication (s) are due for refill today: yes  Requested medication (s) are on the active medication list: yes  Last refill:  12/05/23  Future visit scheduled: yes  Notes to clinic:  Unable to refill per protocol, cannot delegate. Historical medication.      Requested Prescriptions  Pending Prescriptions Disp Refills   QUEtiapine (SEROQUEL) 25 MG tablet      Sig: Take 3 tablets (75 mg total) by mouth at bedtime.     Not Delegated - Psychiatry:  Antipsychotics - Second Generation (Atypical) - quetiapine Failed - 05/14/2024  3:45 PM      Failed - This refill cannot be delegated      Failed - Last BP in normal range    BP Readings from Last 1 Encounters:  05/05/24 (!) 149/84         Failed - Lipid Panel in normal range within the last 12 months    Cholesterol, Total  Date Value Ref Range Status  08/03/2023 213 (H) 100 - 199 mg/dL Final   LDL Chol Calc (NIH)  Date Value Ref Range Status  08/03/2023 112 (H) 0 - 99 mg/dL Final   Direct LDL  Date Value Ref Range Status  11/28/2022 55 0 - 99 mg/dL Final    Comment:    Performed at [facility], [address], [place], [place] [address]   HDL  Date Value Ref Range Status  08/03/2023 24 (L) >39 mg/dL Final   Triglycerides  Date Value Ref Range Status  08/03/2023 443 (H) 0 - 149 mg/dL Final         Passed - TSH in normal range and within 360 days    TSH  Date Value Ref Range Status  05/28/2023 2.100 0.450 - 4.500 uIU/mL Final         Passed - Completed PHQ-2 or PHQ-9 in the last 360 days      Passed - Last Heart Rate in normal range    Pulse Readings from Last 1 Encounters:  05/05/24 68         Passed - Valid encounter within last 6 months    Recent Outpatient Visits           1 week ago Essential hypertension   [facility] [place], [place], PA-C   1 month ago Essential hypertension   [facility] [place], [place], PA-C   1 month ago Essential  hypertension   [facility] [place], [place], PA-C   2 months ago Atypical pneumonia   [place] [facility] [place], [place], PA-C   3 months ago COPD with acute exacerbation [facility])   [place] [facility] [place], [place], PA-C       Future Appointments             In 3 weeks Penumalli, Eduard SAUNDERS, MD  [facility]            Passed - CBC within normal limits and completed in the last 12 months    WBC  Date Value Ref Range Status  02/29/2024 7.5 4.0 - 10.5 K/uL Final   RBC  Date Value Ref Range Status  02/29/2024 4.34 4.22 - 5.81 MIL/uL Final   Hemoglobin  Date Value Ref Range Status  02/29/2024 12.6 (L) 13.0 - 17.0 g/dL Final  97/96/7974 84.5 13.0 - 17.7 g/dL Final   HCT  Date Value Ref Range Status  02/29/2024 38.8 (L)  39.0 - 52.0 % Final   Hematocrit  Date Value Ref Range Status  05/28/2023 46.6 37.5 - 51.0 % Final   MCHC  Date Value Ref Range Status  02/29/2024 32.5 30.0 - 36.0 g/dL Final   [facility]  Date Value Ref Range Status  02/29/2024 29.0 26.0 - 34.0 pg Final   MCV  Date Value Ref Range Status  02/29/2024 89.4 80.0 - 100.0 fL Final  05/28/2023 90 79 - 97 fL Final   No results found for: PLTCOUNTKUC, LABPLAT, POCPLA RDW  Date Value Ref Range Status  02/29/2024 14.9 11.5 - 15.5 % Final  05/28/2023 14.5 11.6 - 15.4 % Final         Passed - CMP within normal limits and completed in the last 12 months    Albumin  Date Value Ref Range Status  02/29/2024 3.6 3.5 - 5.0 g/dL Final  97/96/7974 4.4 3.9 - 4.9 g/dL Final   Albumin ELP  Date Value Ref Range Status  01/22/2023 4.2 2.9 - 4.4 g/dL Final   Alkaline Phosphatase  Date Value Ref Range Status  02/29/2024 95 38 - 126 U/L Final   ALT  Date Value Ref Range Status  02/29/2024 19 0 - 44 U/L Final   AST  Date Value Ref Range Status  02/29/2024 16 15 - 41 U/L Final   BUN  Date Value Ref Range Status   02/29/2024 19 8 - 23 mg/dL Final  97/96/7974 12 8 - 27 mg/dL Final   Calcium   Date Value Ref Range Status  02/29/2024 9.0 8.9 - 10.3 mg/dL Final   CO2  Date Value Ref Range Status  02/29/2024 26 22 - 32 mmol/L Final   Bicarbonate  Date Value Ref Range Status  02/29/2024 28.5 (H) 20.0 - 28.0 mmol/L Final   Creatinine, Ser  Date Value Ref Range Status  02/29/2024 1.64 (H) 0.61 - 1.24 mg/dL Final   Glucose, Bld  Date Value Ref Range Status  02/29/2024 96 70 - 99 mg/dL Final    Comment:    Glucose reference range applies only to samples taken after fasting for at least 8 hours.   Glucose-Capillary  Date Value Ref Range Status  12/13/2022 93 70 - 99 mg/dL Final    Comment:    Glucose reference range applies only to samples taken after fasting for at least 8 hours.   Potassium  Date Value Ref Range Status  02/29/2024 4.3 3.5 - 5.1 mmol/L Final   Sodium  Date Value Ref Range Status  02/29/2024 135 135 - 145 mmol/L Final  05/28/2023 140 134 - 144 mmol/L Final   Total Bilirubin  Date Value Ref Range Status  02/29/2024 0.9 0.0 - 1.2 mg/dL Final   Bilirubin Total  Date Value Ref Range Status  05/28/2023 0.2 0.0 - 1.2 mg/dL Final   Bilirubin, Direct  Date Value Ref Range Status  01/28/2022 <0.1 0.0 - 0.2 mg/dL Final   Indirect Bilirubin  Date Value Ref Range Status  01/28/2022 NOT CALCULATED 0.3 - 0.9 mg/dL Final    Comment:    Performed at [facility], [address] [place], [place] [address]   Protein, ur  Date Value Ref Range Status  10/27/2023 NEGATIVE NEGATIVE mg/dL Final   Total Protein  Date Value Ref Range Status  02/29/2024 7.7 6.5 - 8.1 g/dL Final  97/96/7974 6.5 6.0 - 8.5 g/dL Final   Total Protein ELP  Date Value Ref Range Status  01/22/2023 7.0 6.0 - 8.5 g/dL  Final   eGFR  Date Value Ref Range Status  05/28/2023 63 >59 mL/min/1.73 Final   GFR, Estimated  Date Value Ref Range Status  02/29/2024 47 (L) >60 mL/min Final     Comment:    (NOTE) Calculated using the CKD-EPI Creatinine Equation (2021)           nortriptyline (PAMELOR) 10 MG capsule      Sig: Take 1 capsule (10 mg total) by mouth daily.     Psychiatry:  Antidepressants - Heterocyclics (TCAs) Passed - 05/14/2024  3:45 PM      Passed - Completed PHQ-2 or PHQ-9 in the last 360 days      Passed - Valid encounter within last 6 months    Recent Outpatient Visits           1 week ago Essential hypertension   [place] [facility] [place], [place], PA-C   1 month ago Essential hypertension   [place] [facility] [place], [place], PA-C   1 month ago Essential hypertension   [place] [facility] [place], [place], PA-C   2 months ago Atypical pneumonia   [place] [facility] [place], [place], PA-C   3 months ago COPD with acute exacerbation [facility])   [place] [facility] [place], [place], PA-C       Future Appointments             In 3 weeks Penumalli, Eduard SAUNDERS, MD [facility]

## 2024-05-16 NOTE — Telephone Encounter (Signed)
 Copied from CRM #[id number]. Topic: Clinical - Medication Question >> May 16, 2024 10:00 AM [place] MATSU wrote: Reason for CRM: Patients wife is calling regarding medication  75mg  of Quettiapine and 20mg  Nortriptyline  per Dr.Penumalli,Vikram R he is requesting for patient Hello! Please contact your PCP / psychiatry for these refills going forward. Thank you. -VRP  However patient does have an appointment with [facility] next month and will still need prescriptions as he is out of medication today.   May you please advise.

## 2024-05-17 MED ORDER — QUETIAPINE FUMARATE 25 MG PO TABS: 75.0000 mg | ORAL_TABLET | Freq: Every day | ORAL | 0 refills | Status: DC

## 2024-05-17 MED ORDER — NORTRIPTYLINE HCL 10 MG PO CAPS: 10.0000 mg | ORAL_CAPSULE | Freq: Every day | ORAL | 0 refills | Status: AC

## 2024-05-25 ENCOUNTER — Other Ambulatory Visit: Payer: Self-pay | Admitting: Physician Assistant

## 2024-05-25 DIAGNOSIS — F32A Depression, unspecified: Secondary | ICD-10-CM

## 2024-05-25 DIAGNOSIS — F5105 Insomnia due to other mental disorder: Secondary | ICD-10-CM

## 2024-05-26 ENCOUNTER — Other Ambulatory Visit: Payer: Self-pay | Admitting: Physician Assistant

## 2024-05-26 NOTE — Telephone Encounter (Unsigned)
 Copied from CRM [id number]. Topic: Clinical - Medication Refill >> May 26, 2024  2:43 PM Delon T wrote: Medication: QUEtiapine  (SEROQUEL ) 25 MG tablet-  not seeing original prescriber  Has the patient contacted their pharmacy? No (Agent: If no, request that the patient contact the pharmacy for the refill. If patient does not wish to contact the pharmacy document the reason why and proceed with request.) (Agent: If yes, when and what did the pharmacy advise?)  This is the patient's preferred pharmacy:  [id number] [place], [place] - [address] AT [facility] [address] [place] [address] Phone: [phone] Fax: [phone]  Is this the correct pharmacy for this prescription? Yes If no, delete pharmacy and type the correct one.   Has the prescription been filled recently? Yes  Is the patient out of the medication? Yes  Has the patient been seen for an appointment in the last year OR does the patient have an upcoming appointment? Yes  Can we respond through MyChart? Yes  Agent: Please be advised that Rx refills may take up to 3 business days. We ask that you follow-up with your pharmacy.

## 2024-05-27 ENCOUNTER — Ambulatory Visit

## 2024-06-03 ENCOUNTER — Ambulatory Visit: Admitting: Physician Assistant

## 2024-06-09 ENCOUNTER — Telehealth: Admitting: Diagnostic Neuroimaging

## 2024-06-16 ENCOUNTER — Ambulatory Visit: Admitting: Pulmonary Disease

## 2024-07-24 ENCOUNTER — Ambulatory Visit: Admitting: Physician Assistant

## 2025-02-16 ENCOUNTER — Inpatient Hospital Stay

## 2025-02-23 ENCOUNTER — Inpatient Hospital Stay: Admitting: Nurse Practitioner
